# Patient Record
Sex: Female | Born: 1955 | Race: Black or African American | Hispanic: No | Marital: Married | Smoking: Never smoker
Health system: Southern US, Community
[De-identification: ages and names within clinical notes are randomized; demographics above are authoritative.]

## PROBLEM LIST (undated history)

## (undated) DIAGNOSIS — Z87440 Personal history of urinary (tract) infections: Secondary | ICD-10-CM

## (undated) DIAGNOSIS — N6310 Unspecified lump in the right breast, unspecified quadrant: Secondary | ICD-10-CM

## (undated) DIAGNOSIS — E7800 Pure hypercholesterolemia, unspecified: Secondary | ICD-10-CM

## (undated) DIAGNOSIS — C50919 Malignant neoplasm of unspecified site of unspecified female breast: Secondary | ICD-10-CM

## (undated) DIAGNOSIS — R21 Rash and other nonspecific skin eruption: Secondary | ICD-10-CM

## (undated) DIAGNOSIS — S129XXA Fracture of neck, unspecified, initial encounter: Secondary | ICD-10-CM

## (undated) DIAGNOSIS — I10 Essential (primary) hypertension: Secondary | ICD-10-CM

## (undated) HISTORY — DX: Essential (primary) hypertension: I10

## (undated) HISTORY — DX: Malignant neoplasm of unspecified site of unspecified female breast: C50.919

## (undated) HISTORY — PX: PORTACATH PLACEMENT: SHX2246

## (undated) HISTORY — PX: ABDOMINAL HYSTERECTOMY: SHX81

## (undated) HISTORY — DX: Personal history of urinary (tract) infections: Z87.440

## (undated) HISTORY — DX: Rash and other nonspecific skin eruption: R21

## (undated) HISTORY — PX: PORT-A-CATH REMOVAL: SHX5289

## (undated) HISTORY — DX: Pure hypercholesterolemia, unspecified: E78.00

## (undated) HISTORY — PX: BREAST BIOPSY: SHX20

## (undated) HISTORY — PX: PARTIAL HYSTERECTOMY: SHX80

---
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
## 2001-04-29 ENCOUNTER — Other Ambulatory Visit: Admission: RE | Admit: 2001-04-29 | Discharge: 2001-04-29 | Payer: Self-pay | Admitting: Obstetrics and Gynecology

## 2003-07-21 ENCOUNTER — Ambulatory Visit (HOSPITAL_COMMUNITY): Admission: RE | Admit: 2003-07-21 | Discharge: 2003-07-21 | Payer: Self-pay | Admitting: Family Medicine

## 2003-08-04 ENCOUNTER — Ambulatory Visit (HOSPITAL_COMMUNITY): Admission: RE | Admit: 2003-08-04 | Discharge: 2003-08-04 | Payer: Self-pay | Admitting: Family Medicine

## 2004-01-03 ENCOUNTER — Ambulatory Visit (HOSPITAL_COMMUNITY): Admission: RE | Admit: 2004-01-03 | Discharge: 2004-01-03 | Payer: Self-pay | Admitting: Family Medicine

## 2004-09-10 DIAGNOSIS — C50919 Malignant neoplasm of unspecified site of unspecified female breast: Secondary | ICD-10-CM

## 2004-09-10 HISTORY — DX: Malignant neoplasm of unspecified site of unspecified female breast: C50.919

## 2004-11-15 ENCOUNTER — Ambulatory Visit (HOSPITAL_COMMUNITY): Admission: RE | Admit: 2004-11-15 | Discharge: 2004-11-15 | Payer: Self-pay | Admitting: Obstetrics and Gynecology

## 2004-11-21 ENCOUNTER — Encounter: Admission: RE | Admit: 2004-11-21 | Discharge: 2004-11-21 | Payer: Self-pay | Admitting: Obstetrics and Gynecology

## 2005-01-08 HISTORY — PX: MASTECTOMY: SHX3

## 2005-01-15 ENCOUNTER — Ambulatory Visit (HOSPITAL_COMMUNITY): Admission: RE | Admit: 2005-01-15 | Discharge: 2005-01-16 | Payer: Self-pay | Admitting: General Surgery

## 2005-01-15 ENCOUNTER — Encounter (INDEPENDENT_AMBULATORY_CARE_PROVIDER_SITE_OTHER): Payer: Self-pay | Admitting: *Deleted

## 2005-01-15 ENCOUNTER — Encounter (INDEPENDENT_AMBULATORY_CARE_PROVIDER_SITE_OTHER): Payer: Self-pay | Admitting: General Surgery

## 2005-02-01 ENCOUNTER — Ambulatory Visit: Payer: Self-pay | Admitting: Oncology

## 2005-02-12 ENCOUNTER — Ambulatory Visit (HOSPITAL_COMMUNITY): Admission: RE | Admit: 2005-02-12 | Discharge: 2005-02-12 | Payer: Self-pay | Admitting: Oncology

## 2005-02-14 ENCOUNTER — Ambulatory Visit: Admission: RE | Admit: 2005-02-14 | Discharge: 2005-03-20 | Payer: Self-pay | Admitting: *Deleted

## 2005-02-20 ENCOUNTER — Ambulatory Visit (HOSPITAL_COMMUNITY): Admission: RE | Admit: 2005-02-20 | Discharge: 2005-02-20 | Payer: Self-pay | Admitting: *Deleted

## 2005-02-20 ENCOUNTER — Encounter (INDEPENDENT_AMBULATORY_CARE_PROVIDER_SITE_OTHER): Payer: Self-pay | Admitting: Cardiology

## 2005-02-23 ENCOUNTER — Encounter: Admission: RE | Admit: 2005-02-23 | Discharge: 2005-02-23 | Payer: Self-pay | Admitting: Oncology

## 2005-02-23 ENCOUNTER — Ambulatory Visit (HOSPITAL_COMMUNITY): Payer: Self-pay | Admitting: Oncology

## 2005-02-28 ENCOUNTER — Ambulatory Visit (HOSPITAL_BASED_OUTPATIENT_CLINIC_OR_DEPARTMENT_OTHER): Admission: RE | Admit: 2005-02-28 | Discharge: 2005-02-28 | Payer: Self-pay | Admitting: General Surgery

## 2005-02-28 ENCOUNTER — Ambulatory Visit (HOSPITAL_COMMUNITY): Admission: RE | Admit: 2005-02-28 | Discharge: 2005-02-28 | Payer: Self-pay | Admitting: General Surgery

## 2005-03-19 ENCOUNTER — Ambulatory Visit: Payer: Self-pay | Admitting: Oncology

## 2005-04-23 ENCOUNTER — Ambulatory Visit: Admission: RE | Admit: 2005-04-23 | Discharge: 2005-07-22 | Payer: Self-pay | Admitting: *Deleted

## 2005-04-24 ENCOUNTER — Ambulatory Visit: Admission: RE | Admit: 2005-04-24 | Discharge: 2005-04-24 | Payer: Self-pay | Admitting: Oncology

## 2005-04-24 ENCOUNTER — Encounter (INDEPENDENT_AMBULATORY_CARE_PROVIDER_SITE_OTHER): Payer: Self-pay | Admitting: Cardiology

## 2005-05-08 ENCOUNTER — Ambulatory Visit: Payer: Self-pay | Admitting: Oncology

## 2005-05-28 ENCOUNTER — Encounter (HOSPITAL_COMMUNITY): Admission: RE | Admit: 2005-05-28 | Discharge: 2005-06-09 | Payer: Self-pay | Admitting: Oncology

## 2005-05-28 ENCOUNTER — Encounter: Admission: RE | Admit: 2005-05-28 | Discharge: 2005-06-09 | Payer: Self-pay | Admitting: Oncology

## 2005-05-28 ENCOUNTER — Ambulatory Visit (HOSPITAL_COMMUNITY): Payer: Self-pay | Admitting: Oncology

## 2005-06-14 ENCOUNTER — Encounter (HOSPITAL_COMMUNITY): Admission: RE | Admit: 2005-06-14 | Discharge: 2005-07-16 | Payer: Self-pay | Admitting: Oncology

## 2005-06-14 ENCOUNTER — Encounter: Admission: RE | Admit: 2005-06-14 | Discharge: 2005-06-14 | Payer: Self-pay | Admitting: Oncology

## 2005-06-18 ENCOUNTER — Ambulatory Visit (HOSPITAL_COMMUNITY): Admission: RE | Admit: 2005-06-18 | Discharge: 2005-06-18 | Payer: Self-pay | Admitting: Oncology

## 2005-07-18 ENCOUNTER — Ambulatory Visit (HOSPITAL_COMMUNITY): Payer: Self-pay | Admitting: Oncology

## 2005-07-18 ENCOUNTER — Encounter (HOSPITAL_COMMUNITY): Admission: RE | Admit: 2005-07-18 | Discharge: 2005-08-17 | Payer: Self-pay | Admitting: Oncology

## 2005-07-18 ENCOUNTER — Encounter: Admission: RE | Admit: 2005-07-18 | Discharge: 2005-07-18 | Payer: Self-pay | Admitting: Oncology

## 2005-08-22 ENCOUNTER — Encounter (HOSPITAL_COMMUNITY): Admission: RE | Admit: 2005-08-22 | Discharge: 2005-08-22 | Payer: Self-pay | Admitting: Oncology

## 2005-08-22 ENCOUNTER — Encounter: Admission: RE | Admit: 2005-08-22 | Discharge: 2005-08-22 | Payer: Self-pay | Admitting: Oncology

## 2005-09-12 ENCOUNTER — Encounter (HOSPITAL_COMMUNITY): Admission: RE | Admit: 2005-09-12 | Discharge: 2005-10-12 | Payer: Self-pay | Admitting: Oncology

## 2005-09-12 ENCOUNTER — Ambulatory Visit (HOSPITAL_COMMUNITY): Payer: Self-pay | Admitting: Oncology

## 2005-09-12 ENCOUNTER — Encounter: Admission: RE | Admit: 2005-09-12 | Discharge: 2005-09-12 | Payer: Self-pay | Admitting: Oncology

## 2005-10-03 ENCOUNTER — Ambulatory Visit: Payer: Self-pay | Admitting: *Deleted

## 2005-10-12 ENCOUNTER — Ambulatory Visit (HOSPITAL_COMMUNITY): Admission: RE | Admit: 2005-10-12 | Discharge: 2005-10-12 | Payer: Self-pay | Admitting: Plastic Surgery

## 2005-10-24 ENCOUNTER — Encounter (HOSPITAL_COMMUNITY): Admission: RE | Admit: 2005-10-24 | Discharge: 2005-11-23 | Payer: Self-pay | Admitting: Oncology

## 2005-10-24 ENCOUNTER — Encounter: Admission: RE | Admit: 2005-10-24 | Discharge: 2005-10-24 | Payer: Self-pay | Admitting: Oncology

## 2005-11-14 ENCOUNTER — Ambulatory Visit (HOSPITAL_COMMUNITY): Payer: Self-pay | Admitting: Oncology

## 2005-12-05 ENCOUNTER — Encounter (HOSPITAL_COMMUNITY): Admission: RE | Admit: 2005-12-05 | Discharge: 2006-01-04 | Payer: Self-pay | Admitting: Oncology

## 2005-12-05 ENCOUNTER — Encounter: Admission: RE | Admit: 2005-12-05 | Discharge: 2005-12-05 | Payer: Self-pay | Admitting: Oncology

## 2005-12-26 ENCOUNTER — Ambulatory Visit: Payer: Self-pay | Admitting: *Deleted

## 2006-01-09 ENCOUNTER — Ambulatory Visit (HOSPITAL_COMMUNITY): Admission: RE | Admit: 2006-01-09 | Discharge: 2006-01-09 | Payer: Self-pay | Admitting: Oncology

## 2006-01-16 ENCOUNTER — Encounter: Admission: RE | Admit: 2006-01-16 | Discharge: 2006-01-16 | Payer: Self-pay | Admitting: Oncology

## 2006-01-16 ENCOUNTER — Ambulatory Visit (HOSPITAL_COMMUNITY): Payer: Self-pay | Admitting: Oncology

## 2006-01-16 ENCOUNTER — Encounter (HOSPITAL_COMMUNITY): Admission: RE | Admit: 2006-01-16 | Discharge: 2006-02-15 | Payer: Self-pay | Admitting: Oncology

## 2006-02-27 ENCOUNTER — Encounter: Admission: RE | Admit: 2006-02-27 | Discharge: 2006-02-27 | Payer: Self-pay | Admitting: Oncology

## 2006-02-27 ENCOUNTER — Encounter (HOSPITAL_COMMUNITY): Admission: RE | Admit: 2006-02-27 | Discharge: 2006-03-29 | Payer: Self-pay | Admitting: Oncology

## 2006-05-01 ENCOUNTER — Encounter: Admission: RE | Admit: 2006-05-01 | Discharge: 2006-05-01 | Payer: Self-pay | Admitting: Oncology

## 2006-05-01 ENCOUNTER — Ambulatory Visit (HOSPITAL_COMMUNITY): Payer: Self-pay | Admitting: Oncology

## 2006-05-02 ENCOUNTER — Ambulatory Visit (HOSPITAL_COMMUNITY): Admission: RE | Admit: 2006-05-02 | Discharge: 2006-05-02 | Payer: Self-pay | Admitting: Oncology

## 2006-06-13 ENCOUNTER — Encounter: Admission: RE | Admit: 2006-06-13 | Discharge: 2006-06-13 | Payer: Self-pay | Admitting: Oncology

## 2006-07-25 ENCOUNTER — Ambulatory Visit (HOSPITAL_COMMUNITY): Payer: Self-pay | Admitting: Oncology

## 2006-09-09 ENCOUNTER — Ambulatory Visit (HOSPITAL_COMMUNITY): Payer: Self-pay | Admitting: Oncology

## 2006-10-21 ENCOUNTER — Encounter (HOSPITAL_COMMUNITY): Admission: RE | Admit: 2006-10-21 | Discharge: 2006-11-20 | Payer: Self-pay | Admitting: Oncology

## 2006-11-04 ENCOUNTER — Ambulatory Visit (HOSPITAL_COMMUNITY): Payer: Self-pay | Admitting: Oncology

## 2007-01-09 ENCOUNTER — Encounter (HOSPITAL_COMMUNITY): Admission: RE | Admit: 2007-01-09 | Discharge: 2007-02-08 | Payer: Self-pay | Admitting: Oncology

## 2007-01-13 ENCOUNTER — Ambulatory Visit (HOSPITAL_COMMUNITY): Payer: Self-pay | Admitting: Oncology

## 2007-04-07 ENCOUNTER — Encounter (HOSPITAL_COMMUNITY): Admission: RE | Admit: 2007-04-07 | Discharge: 2007-05-07 | Payer: Self-pay | Admitting: Oncology

## 2007-04-07 ENCOUNTER — Ambulatory Visit (HOSPITAL_COMMUNITY): Payer: Self-pay | Admitting: Oncology

## 2007-06-16 ENCOUNTER — Ambulatory Visit (HOSPITAL_COMMUNITY): Payer: Self-pay | Admitting: Oncology

## 2007-06-16 ENCOUNTER — Encounter (HOSPITAL_COMMUNITY): Admission: RE | Admit: 2007-06-16 | Discharge: 2007-07-16 | Payer: Self-pay | Admitting: Oncology

## 2007-08-21 ENCOUNTER — Encounter (HOSPITAL_COMMUNITY): Admission: RE | Admit: 2007-08-21 | Discharge: 2007-09-10 | Payer: Self-pay | Admitting: Oncology

## 2007-08-25 ENCOUNTER — Ambulatory Visit (HOSPITAL_COMMUNITY): Payer: Self-pay | Admitting: Oncology

## 2007-10-06 ENCOUNTER — Encounter (HOSPITAL_COMMUNITY): Admission: RE | Admit: 2007-10-06 | Discharge: 2007-11-05 | Payer: Self-pay | Admitting: Oncology

## 2007-11-03 ENCOUNTER — Ambulatory Visit (HOSPITAL_COMMUNITY): Payer: Self-pay | Admitting: Oncology

## 2008-01-21 ENCOUNTER — Encounter (HOSPITAL_COMMUNITY): Admission: RE | Admit: 2008-01-21 | Discharge: 2008-02-20 | Payer: Self-pay | Admitting: Oncology

## 2008-01-26 ENCOUNTER — Ambulatory Visit (HOSPITAL_COMMUNITY): Payer: Self-pay | Admitting: Oncology

## 2008-04-19 ENCOUNTER — Ambulatory Visit (HOSPITAL_COMMUNITY): Payer: Self-pay | Admitting: Oncology

## 2008-05-31 ENCOUNTER — Encounter (HOSPITAL_COMMUNITY): Admission: RE | Admit: 2008-05-31 | Discharge: 2008-06-07 | Payer: Self-pay | Admitting: Oncology

## 2008-07-12 ENCOUNTER — Ambulatory Visit (HOSPITAL_COMMUNITY): Payer: Self-pay | Admitting: Oncology

## 2008-10-04 ENCOUNTER — Ambulatory Visit (HOSPITAL_COMMUNITY): Payer: Self-pay | Admitting: Oncology

## 2009-01-03 ENCOUNTER — Encounter (HOSPITAL_COMMUNITY): Admission: RE | Admit: 2009-01-03 | Discharge: 2009-02-02 | Payer: Self-pay | Admitting: Oncology

## 2009-01-03 ENCOUNTER — Ambulatory Visit (HOSPITAL_COMMUNITY): Payer: Self-pay | Admitting: Oncology

## 2009-03-11 ENCOUNTER — Ambulatory Visit (HOSPITAL_COMMUNITY): Admission: RE | Admit: 2009-03-11 | Discharge: 2009-03-11 | Payer: Self-pay | Admitting: Oncology

## 2009-03-17 ENCOUNTER — Ambulatory Visit (HOSPITAL_COMMUNITY): Payer: Self-pay | Admitting: Oncology

## 2009-09-14 ENCOUNTER — Encounter (HOSPITAL_COMMUNITY): Admission: RE | Admit: 2009-09-14 | Discharge: 2009-10-14 | Payer: Self-pay | Admitting: Oncology

## 2009-09-14 ENCOUNTER — Ambulatory Visit (HOSPITAL_COMMUNITY): Payer: Self-pay | Admitting: Oncology

## 2010-02-10 ENCOUNTER — Ambulatory Visit (HOSPITAL_COMMUNITY): Admission: RE | Admit: 2010-02-10 | Discharge: 2010-02-10 | Payer: Self-pay | Admitting: Oncology

## 2010-03-28 ENCOUNTER — Ambulatory Visit (HOSPITAL_COMMUNITY): Payer: Self-pay | Admitting: Oncology

## 2010-04-25 ENCOUNTER — Encounter: Payer: Self-pay | Admitting: Gastroenterology

## 2010-05-09 ENCOUNTER — Ambulatory Visit (HOSPITAL_COMMUNITY): Admission: RE | Admit: 2010-05-09 | Discharge: 2010-05-09 | Payer: Self-pay | Admitting: Gastroenterology

## 2010-05-09 ENCOUNTER — Ambulatory Visit: Payer: Self-pay | Admitting: Gastroenterology

## 2010-10-04 ENCOUNTER — Encounter (HOSPITAL_COMMUNITY)
Admission: RE | Admit: 2010-10-04 | Discharge: 2010-10-10 | Payer: Self-pay | Source: Home / Self Care | Attending: Oncology | Admitting: Oncology

## 2010-10-04 ENCOUNTER — Ambulatory Visit (HOSPITAL_COMMUNITY)
Admission: RE | Admit: 2010-10-04 | Discharge: 2010-10-10 | Payer: Self-pay | Source: Home / Self Care | Attending: Oncology | Admitting: Oncology

## 2010-10-04 LAB — DIFFERENTIAL
Basophils Absolute: 0 10*3/uL (ref 0.0–0.1)
Basophils Relative: 0 % (ref 0–1)
Eosinophils Absolute: 0.2 10*3/uL (ref 0.0–0.7)
Eosinophils Relative: 4 % (ref 0–5)
Lymphocytes Relative: 39 % (ref 12–46)
Lymphs Abs: 1.8 10*3/uL (ref 0.7–4.0)
Monocytes Absolute: 0.4 10*3/uL (ref 0.1–1.0)
Monocytes Relative: 8 % (ref 3–12)
Neutro Abs: 2.3 10*3/uL (ref 1.7–7.7)
Neutrophils Relative %: 50 % (ref 43–77)

## 2010-10-04 LAB — COMPREHENSIVE METABOLIC PANEL
ALT: 28 U/L (ref 0–35)
AST: 29 U/L (ref 0–37)
Albumin: 4.1 g/dL (ref 3.5–5.2)
Alkaline Phosphatase: 100 U/L (ref 39–117)
BUN: 10 mg/dL (ref 6–23)
CO2: 29 mEq/L (ref 19–32)
Calcium: 9.7 mg/dL (ref 8.4–10.5)
Chloride: 96 mEq/L (ref 96–112)
Creatinine, Ser: 0.74 mg/dL (ref 0.4–1.2)
GFR calc Af Amer: >60 mL/min (ref >60)
GFR calc non Af Amer: >60 mL/min (ref >60)
Glucose, Bld: 96 mg/dL (ref 70–99)
Potassium: 3.8 mEq/L (ref 3.5–5.1)
Sodium: 134 mEq/L — ABNORMAL LOW (ref 135–145)
Total Bilirubin: 0.4 mg/dL (ref 0.3–1.2)
Total Protein: 7.9 g/dL (ref 6.0–8.3)

## 2010-10-04 LAB — CBC
HCT: 36.7 % (ref 36.0–46.0)
Hemoglobin: 12.8 g/dL (ref 12.0–15.0)
MCH: 31 pg (ref 26.0–34.0)
MCHC: 34.9 g/dL (ref 30.0–36.0)
MCV: 88.9 fL (ref 78.0–100.0)
Platelets: 297 10*3/uL (ref 150–400)
RBC: 4.13 MIL/uL (ref 3.87–5.11)
RDW: 12.1 % (ref 11.5–15.5)
WBC: 4.6 10*3/uL (ref 4.0–10.5)

## 2010-10-10 NOTE — Letter (Signed)
Summary: Internal Other Domingo Dimes  Internal Other Domingo Dimes   Imported By: Cloria Spring LPN [id number]/[id number] 43:32:95  _____________________________________________________________________  External Attachment:    Type:   Image     Comment:   External Document

## 2010-11-26 LAB — COMPREHENSIVE METABOLIC PANEL
ALT: 44 U/L — ABNORMAL HIGH (ref 0–35)
AST: 34 U/L (ref 0–37)
Albumin: 3.8 g/dL (ref 3.5–5.2)
Alkaline Phosphatase: 112 U/L (ref 39–117)
BUN: 15 mg/dL (ref 6–23)
CO2: 29 mEq/L (ref 19–32)
Calcium: 9.5 mg/dL (ref 8.4–10.5)
Chloride: 96 mEq/L (ref 96–112)
Creatinine, Ser: 0.77 mg/dL (ref 0.4–1.2)
GFR calc Af Amer: >60 mL/min (ref >60)
GFR calc non Af Amer: >60 mL/min (ref >60)
Glucose, Bld: 86 mg/dL (ref 70–99)
Potassium: 3.6 mEq/L (ref 3.5–5.1)
Sodium: 135 mEq/L (ref 135–145)
Total Bilirubin: 0.6 mg/dL (ref 0.3–1.2)
Total Protein: 8 g/dL (ref 6.0–8.3)

## 2010-11-26 LAB — DIFFERENTIAL
Basophils Absolute: 0 10*3/uL (ref 0.0–0.1)
Basophils Relative: 1 % (ref 0–1)
Eosinophils Absolute: 0.1 10*3/uL (ref 0.0–0.7)
Eosinophils Relative: 3 % (ref 0–5)
Lymphocytes Relative: 37 % (ref 12–46)
Lymphs Abs: 1.6 10*3/uL (ref 0.7–4.0)
Monocytes Absolute: 0.4 10*3/uL (ref 0.1–1.0)
Monocytes Relative: 9 % (ref 3–12)
Neutro Abs: 2.2 10*3/uL (ref 1.7–7.7)
Neutrophils Relative %: 51 % (ref 43–77)

## 2010-11-26 LAB — CBC
HCT: 35.8 % — ABNORMAL LOW (ref 36.0–46.0)
Hemoglobin: 12.4 g/dL (ref 12.0–15.0)
MCHC: 34.7 g/dL (ref 30.0–36.0)
MCV: 90.7 fL (ref 78.0–100.0)
Platelets: 265 10*3/uL (ref 150–400)
RBC: 3.95 MIL/uL (ref 3.87–5.11)
RDW: 12.3 % (ref 11.5–15.5)
WBC: 4.3 10*3/uL (ref 4.0–10.5)

## 2010-12-18 LAB — GLUCOSE, CAPILLARY: Glucose-Capillary: 97 mg/dL (ref 70–99)

## 2011-01-26 NOTE — Discharge Summary (Signed)
NAMEJATON, Stephanie NO.:  [id number]   MEDICAL RECORD NO.:  [id number]          PATIENT TYPE:  OIB   LOCATION:  5710                         FACILITY:  [facility]   PHYSICIAN:  Etter Sjogren, M.D.     DATE OF BIRTH:  June 22, 1956   DATE OF ADMISSION:  01/15/2005  DATE OF DISCHARGE:  01/16/2005                                 DISCHARGE SUMMARY   FINAL DIAGNOSES:  Left breast cancer.   PROCEDURE:  1.  Left total mastectomy.  2.  Left sentinel lymph node biopsy.  3.  Left breast reconstructive tissue expander.   HISTORY OF PRESENT ILLNESS:  This is a 55 year old woman with left breast  cancer.  Otherwise in good health except for some hypertension, well-  controlled medically.  She discussed with the general surgeon at length and  then mastectomy was recommended.  She desired reconstruction and she elected  to have reconstruction using tissue expander followed by implant.  This  procedure and risks were discussed with her in detail and she understood  these and wished to proceed.   HOSPITAL COURSE:  On admission, her hemoglobin 12.0, hematocrit 35.2, WBC  5.8.  Her routine chemistry with slight elevation in one of her liver  function test.  Otherwise, all normal.  She was taken to surgery at which  time the mastectomy, sentinel lymph node biopsy and reconstructive tissue  expander were performed.  She tolerated this well.   Postoperatively, she did well and remained afebrile.  Drain was functioning.  Chest soft.  Breast implant looked very good.  She is ready to be  discharged.   DISPOSITION:  She was discharged on a regular diet.  No lifting.  No raising  her arm over her head.  She will use her spirometer at home.  No shower yet.  Empty drain three times a day and include the amount.   DISCHARGE MEDICATIONS:  She will be on Keflex four times a day and other  medication as previously.   FOLLOW UP:  Will see her back in the office in six days for  recheck.      DB/MEDQ  D:  01/16/2005  T:  01/16/2005  Job:  [id number]

## 2011-01-26 NOTE — Procedures (Signed)
NAMEFERNANDA, Stephanie Galvan NO.:  [id number]   MEDICAL RECORD NO.:  [id number]          PATIENT TYPE:  REC   LOCATION:                                FACILITY:  [facility]   PHYSICIAN:  Vida Roller, M.D.   DATE OF BIRTH:  1956-08-30   DATE OF PROCEDURE:  12/26/2005  DATE OF DISCHARGE:                                  ECHOCARDIOGRAM   TAPE NUMBER:  [id number].   TAPE COUNT:  [id number].   This is an assessment for LV systolic function on a woman to follow up with  chemotherapy for breast cancer.  Previous study done in January of this year  shows preserved LV systolic function with an ejection fraction of about 50  to 55%.  No significant valvular heart disease.  Today's study is  technically difficult, very limited views due to the patient having a left  breast implant, so assessment in somewhat limited.   __________ Tracings:  The aorta is 31 mm.   Left atrium is 38 mm.   Septum is 11 mm.   Posterior wall is 11 mm.   Left ventricular diastolic dimension 45 mm.   Left ventricular systolic dimension 39 mm.   A 2-D and Doppler imaging:  The left ventricle appears to be normal size.  There is depressed LV systolic function with an estimated ejection fraction  of around 40%.  Very difficult study to assess.  Measurements of the LV  systolic function show an ejection fraction of 30 to 35%, however, the  parasternal short axis view appears to be improved.  LV systolic function  from that seen in the parasternal wall, however, there is no corroborative  apical views due to the patient's limited acoustic windows and therefore  assessment is very difficult.  The RV systolic function appears normal.   There is no pericardial effusion.   The aortic valve and mitral valve appear to be morphologically unremarkable.  There is mild mitral regurgitation.   ASSESSMENT:  Significantly decreased left ventricular systolic function from  previous study, however, this study is  technically poor.  Would recommend a  more definitive study of a MUGA scan to clarify the ejection fraction and if  this patient needs ongoing evaluation of her left ventricular systolic  function, it may be test to do serial MUGA scans as opposed to echoes due to  the limited windows.      Vida Roller, M.D.  Electronically Signed     JH/MEDQ  D:  12/26/2005  T:  12/27/2005  Job:  [id number]   cc:   Ladona Horns. Mariel Sleet, MD  Fax: [phone]

## 2011-01-26 NOTE — Op Note (Signed)
NAMEMIKAL, WISMAN NO.:  [id number]   MEDICAL RECORD NO.:  [id number]          PATIENT TYPE:  INP   LOCATION:  2899                         FACILITY:  [facility]   PHYSICIAN:  Etter Sjogren, M.D.     DATE OF BIRTH:  Jun 09, 1956   DATE OF PROCEDURE:  10/12/2005  DATE OF DISCHARGE:                                 OPERATIVE REPORT   PREOPERATIVE DIAGNOSES:  1.  Left breast cancer.  2.  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   POSTOPERATIVE DIAGNOSES:  1.  Left breast cancer.  2.  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   OPERATION PERFORMED:  1.  Extensive capsulotomy for capsular contracture.  2.  Left breast reconstruction removing expander and placement of silicone      gel implant.   SURGEON:  Etter Sjogren, M.D.   ANESTHESIA:  General.   ESTIMATED BLOOD LOSS:  Minimal.   INDICATIONS FOR PROCEDURE:  55 year old woman who has had breast cancer, has  had placement of a tissue expander for reconstruction at the time of her  mastectomy.  She subsequently had chemotherapy followed by radiation  therapy.  The radiation was unplanned preoperatively.  She has had  significant capsular contracture.  The long term effects of radiation were  discussed with her and the possibility of complications and the possibility  of doing a latissimus flap, placement of a rim flap was discussed.  She  decline the latissimus flap, prefers to try the placement of the implant to  see what sort of healing she could achieve with that alone.  She understood  she would have to have a fairly extensive capsulotomy at that time to lower  the implant in position.  She wished to proceed.   DESCRIPTION OF PROCEDURE:  The patient was placed in the full upright  position in the holding area and marked for the capsulotomy.  She was then  taken to the operating room and placed supine.  After  successful induction  of general anesthesia, she was prepped with Betadine and draped with sterile  drapes.  The old mastectomy scar was utilized, a total of six inches in  length and the dissection carried down through the subcutaneous tissue and  muscle using electrocautery.  The expander was removed very gently.  The  capsulotomy was performed from  medial to lateral on the inframammary fold  taking great care to avoid any damage to the underlying chest.  Care was  taken to stay on top of the ribs but deep to the muscle. Meticulous  hemostasis with the electrocautery.  Thorough irrigation with saline and  then a 3-0 Vicryl horizontal mattress suture was placed at either end of the  wound for the capsule closure but left untied.  With upward traction  maintained on these sutures, antibiotic solution was placed in the pocket  and allowed to dwell there.  Likewise, antibiotic solution was placed over  the silicone gel implant and it was allowed to dwell  in the antibiotic  solution.  The implant was a high profile silicone memory gel from the  [employer]. After thoroughly cleaning gloves, the implant was positioned  in the pocket.  She was placed in sitting position on the operating table  and excellent symmetry with the opposite site was noted.  The remainder of  the closure with 3-0 Vicryl interrupted horizontal figure-of-eight sutures  taking great care to avoid any damage to underlying implant which was  directly visualized and a malleable retractor was likely placed on the  implant to protect it.  These sutures were tied securely.  The wound was  irrigated thoroughly and the remainder of the closure with 3-0 Monocryl  interrupted inverted deep sutures, again taking great care to avoid any deep  penetration of the muscle and any damage to the implant followed by 3-0  Monocryl running subcuticular tissue, Steri-Strips, dry sterile dressing and  a circumferential Ace wrap applied.  The  Ace wrap was applied mostly above  the implant to hold it in the inferior aspect of the pocket.  The patient  tolerated the procedure well.   DISPOSITION:  We will recheck in the office next week.      Etter Sjogren, M.D.  Electronically Signed     DB/MEDQ  D:  10/12/2005  T:  10/12/2005  Job:  [id number]

## 2011-02-02 ENCOUNTER — Other Ambulatory Visit (HOSPITAL_COMMUNITY): Payer: Self-pay | Admitting: Oncology

## 2011-02-02 DIAGNOSIS — Z139 Encounter for screening, unspecified: Secondary | ICD-10-CM

## 2011-02-13 ENCOUNTER — Ambulatory Visit (HOSPITAL_COMMUNITY)
Admission: RE | Admit: 2011-02-13 | Discharge: 2011-02-13 | Disposition: A | Discharge status: Home / Self Care | Payer: BC Managed Care – PPO | Source: Ambulatory Visit | Attending: Oncology | Admitting: Oncology

## 2011-02-13 DIAGNOSIS — Z1231 Encounter for screening mammogram for malignant neoplasm of breast: Secondary | ICD-10-CM | POA: Insufficient documentation

## 2011-02-13 DIAGNOSIS — Z139 Encounter for screening, unspecified: Secondary | ICD-10-CM

## 2011-04-05 ENCOUNTER — Other Ambulatory Visit (HOSPITAL_COMMUNITY): Payer: Self-pay

## 2011-04-09 ENCOUNTER — Ambulatory Visit (HOSPITAL_COMMUNITY): Payer: Self-pay | Admitting: Oncology

## 2011-05-03 ENCOUNTER — Encounter (HOSPITAL_COMMUNITY): Payer: BC Managed Care – PPO | Attending: Oncology

## 2011-05-03 DIAGNOSIS — Z853 Personal history of malignant neoplasm of breast: Secondary | ICD-10-CM | POA: Insufficient documentation

## 2011-05-03 DIAGNOSIS — I10 Essential (primary) hypertension: Secondary | ICD-10-CM | POA: Insufficient documentation

## 2011-05-03 DIAGNOSIS — Z79899 Other long term (current) drug therapy: Secondary | ICD-10-CM | POA: Insufficient documentation

## 2011-05-03 DIAGNOSIS — E7800 Pure hypercholesterolemia, unspecified: Secondary | ICD-10-CM | POA: Insufficient documentation

## 2011-05-03 LAB — DIFFERENTIAL
Basophils Absolute: 0 10*3/uL (ref 0.0–0.1)
Basophils Relative: 1 % (ref 0–1)
Eosinophils Absolute: 0.2 10*3/uL (ref 0.0–0.7)
Eosinophils Relative: 4 % (ref 0–5)
Lymphocytes Relative: 38 % (ref 12–46)
Lymphs Abs: 1.7 10*3/uL (ref 0.7–4.0)
Monocytes Absolute: 0.3 10*3/uL (ref 0.1–1.0)
Monocytes Relative: 7 % (ref 3–12)
Neutro Abs: 2.2 10*3/uL (ref 1.7–7.7)
Neutrophils Relative %: 50 % (ref 43–77)

## 2011-05-03 LAB — CBC
HCT: 36.8 % (ref 36.0–46.0)
Hemoglobin: 12.2 g/dL (ref 12.0–15.0)
MCH: 30.3 pg (ref 26.0–34.0)
MCHC: 33.2 g/dL (ref 30.0–36.0)
MCV: 91.3 fL (ref 78.0–100.0)
Platelets: 277 10*3/uL (ref 150–400)
RBC: 4.03 MIL/uL (ref 3.87–5.11)
RDW: 12.2 % (ref 11.5–15.5)
WBC: 4.3 10*3/uL (ref 4.0–10.5)

## 2011-05-03 LAB — COMPREHENSIVE METABOLIC PANEL
ALT: 36 U/L — ABNORMAL HIGH (ref 0–35)
AST: 32 U/L (ref 0–37)
Albumin: 3.7 g/dL (ref 3.5–5.2)
Alkaline Phosphatase: 153 U/L — ABNORMAL HIGH (ref 39–117)
BUN: 16 mg/dL (ref 6–23)
CO2: 32 mEq/L (ref 19–32)
Calcium: 10.2 mg/dL (ref 8.4–10.5)
Chloride: 101 mEq/L (ref 96–112)
Creatinine, Ser: 0.69 mg/dL (ref 0.50–1.10)
GFR calc Af Amer: >60 mL/min (ref >60)
GFR calc non Af Amer: >60 mL/min (ref >60)
Glucose, Bld: 91 mg/dL (ref 70–99)
Potassium: 3.8 mEq/L (ref 3.5–5.1)
Sodium: 141 mEq/L (ref 135–145)
Total Bilirubin: 0.4 mg/dL (ref 0.3–1.2)
Total Protein: 7.7 g/dL (ref 6.0–8.3)

## 2011-05-03 NOTE — Progress Notes (Signed)
Labs drawn today for cbc/diff,cmp 

## 2011-05-15 ENCOUNTER — Encounter (HOSPITAL_COMMUNITY): Payer: BC Managed Care – PPO | Attending: Oncology | Admitting: Oncology

## 2011-05-15 ENCOUNTER — Encounter (HOSPITAL_COMMUNITY): Payer: Self-pay | Admitting: Oncology

## 2011-05-15 VITALS — BP 121/71 | HR 65 | Temp 98.6°F | Wt 177.0 lb

## 2011-05-15 DIAGNOSIS — C50919 Malignant neoplasm of unspecified site of unspecified female breast: Secondary | ICD-10-CM | POA: Insufficient documentation

## 2011-05-15 NOTE — Progress Notes (Signed)
CC:   Stephanie Galvan. Sudie Bailey, M.D. Thomas A. Cornett, M.D. Lowella Dell, M.D.  DIAGNOSES: 1. Multifocal left-sided breast cancer, ER/PR negative, but HER-2/neu     positive and she had 2 positive actual lymph nodes.  No LVI was     seen and margins were clear.  She was diagnosed in April 2006 with     surgery on 01/15/2005 consisting of a left simple mastectomy and     axillary sentinel node dissection and was treated with adjuvant     chemotherapy consisting of Taxotere and Cytoxan x4 cycles in a dose-     dense fashion in conjunction with Herceptin for 52 weeks in     conjunction also with radiation of the chest wall and intramammary     chain because of a questionable area on her CT scan. 2. Mild elevation of liver enzymes once again and I suspect is from a     statin and she will call Dr. Sudie Bailey about this. 3. Hypercholesterolemia. 4. Mild excess weight, weighing 177 pounds on a 5 feet and 9 inch     frame. 5. Tubal ligation after the birth of her second daughter. 6. Colonoscopies in the past. 7. Partial hysterectomy in the past. 8. History of hypertension on therapy with hydrochlorothiazide and     atenolol.  INTERVAL HISTORY:  Her vital signs today are fine, except for the weight which is back up from 168 to 177.  She has an excellent appetite and eats out a lot.  She does not always watch what she is eating.  She sometimes eats fried foods and a fair amount of bread.  She does not get any exercise, really.  On her liver enzymes the other day, once again the alkaline phosphatase and SGOT were mildly elevated.  She looks good and feels good.  Her review of systems oncologic is really negative, though.  She states that she is up-to-date on her colonoscopy.  She is not due for one for quite some time.  PHYSICAL EXAMINATION:  General Appearance:  Her exam remains benign. Lymphatics:  She has no adenopathy in any location.  Lungs:  Clear to auscultation and percussion.   Heart:  Regular rhythm and rate without murmur, rub or gallop.  Breasts:  The left reconstructed breast is negative for any lesions.  The right breast is negative for any lesions. The old Port-A-Cath site is intact with a slight keloid.  Abdomen: Soft, nontender without organomegaly or masses.  Bowel sounds are normal.  Extremities:  She has no peripheral edema in the arms or legs.  ASSESSMENT AND PLAN:  She looks great, but she will call Dr. Sudie Bailey in a few days.  I want her to discuss the statin issue with him since he controls that issue.  We will see her back this time in a year since she has done so well and she is out so long at this juncture.    ______________________________ Ladona Horns. Mariel Sleet, MD ESN/MEDQ  D:  05/15/2011  T:  05/15/2011  Job:  [id number]

## 2011-05-15 NOTE — Patient Instructions (Signed)
[facility]  Discharge Instructions  RECOMMENDATIONS MADE BY THE CONSULTANT AND ANY TEST RESULTS WILL BE SENT TO YOUR REFERRING DOCTOR.   EXAM FINDINGS BY MD TODAY AND SIGNS AND SYMPTOMS TO REPORT TO CLINIC OR PRIMARY MD: Doing well per MD.  Liver function tests slightly elevated, may be due to cholesterol medication.  Call Dr. Sudie Bailey in a few days and discuss with him.  Report any new lumps, bone pain or shortness of breath.  SPECIAL INSTRUCTIONS/FOLLOW-UP: Lab work Needed 1 year and Return to Clinic on 1 year.   I acknowledge that I have been informed and understand all the instructions given to me and received a copy. I do not have any more questions at this time, but understand that I may call the Specialty Clinic at [facility] at [phone] during business hours should I have any further questions or need assistance in obtaining follow-up care.    __________________________________________  _____________  __________ Signature of Patient or Authorized Representative            Date                   Time    __________________________________________ Nurse's Signature

## 2011-05-15 NOTE — Progress Notes (Signed)
This office note has been dictated.

## 2011-06-01 LAB — CBC
HCT: 35.6 — ABNORMAL LOW
Hemoglobin: 12.5
MCHC: 35.1
MCV: 90
Platelets: 270
RBC: 3.96
RDW: 12.4
WBC: 3.9 — ABNORMAL LOW

## 2011-06-01 LAB — LIPID PANEL
Cholesterol: 209 — ABNORMAL HIGH
HDL: 50
LDL Cholesterol: 150 — ABNORMAL HIGH
Total CHOL/HDL Ratio: 4.2
Triglycerides: 45
VLDL: 9

## 2011-06-01 LAB — DIFFERENTIAL
Basophils Absolute: 0
Basophils Relative: 0
Eosinophils Absolute: 0.1
Eosinophils Relative: 2
Lymphocytes Relative: 25
Lymphs Abs: 1
Monocytes Absolute: 0.4
Monocytes Relative: 10
Neutro Abs: 2.5
Neutrophils Relative %: 64

## 2011-06-01 LAB — COMPREHENSIVE METABOLIC PANEL
ALT: 21
AST: 21
Albumin: 4
Alkaline Phosphatase: 103
BUN: 7
CO2: 28
Calcium: 9.6
Chloride: 103
Creatinine, Ser: 0.75
GFR calc Af Amer: >60
GFR calc non Af Amer: >60
Glucose, Bld: 98
Potassium: 3.4 — ABNORMAL LOW
Sodium: 137
Total Bilirubin: 0.7
Total Protein: 7.7

## 2011-06-11 LAB — DIFFERENTIAL
Basophils Absolute: 0
Basophils Relative: 1
Eosinophils Absolute: 0.1
Eosinophils Relative: 3
Lymphocytes Relative: 42
Lymphs Abs: 1.6
Monocytes Absolute: 0.3
Monocytes Relative: 8
Neutro Abs: 1.8
Neutrophils Relative %: 47

## 2011-06-11 LAB — COMPREHENSIVE METABOLIC PANEL
ALT: 39 — ABNORMAL HIGH
AST: 35
Albumin: 3.8
Alkaline Phosphatase: 89
BUN: 16
CO2: 29
Calcium: 9.2
Chloride: 101
Creatinine, Ser: 0.7
GFR calc Af Amer: >60
GFR calc non Af Amer: >60
Glucose, Bld: 88
Potassium: 3.3 — ABNORMAL LOW
Sodium: 138
Total Bilirubin: 0.6
Total Protein: 7.6

## 2011-06-11 LAB — LIPID PANEL
Cholesterol: 203 — ABNORMAL HIGH
HDL: 36 — ABNORMAL LOW
LDL Cholesterol: 153 — ABNORMAL HIGH
Total CHOL/HDL Ratio: 5.6
Triglycerides: 68
VLDL: 14

## 2011-06-11 LAB — CBC
HCT: 33.8 — ABNORMAL LOW
Hemoglobin: 11.7 — ABNORMAL LOW
MCHC: 34.5
MCV: 92.3
Platelets: 249
RBC: 3.66 — ABNORMAL LOW
RDW: 11.9
WBC: 4.2

## 2011-06-18 LAB — CREATININE, SERUM
Creatinine, Ser: 0.72
GFR calc Af Amer: >60
GFR calc non Af Amer: >60

## 2011-06-19 LAB — LIPID PANEL
Cholesterol: 236 — ABNORMAL HIGH
HDL: 46
LDL Cholesterol: 166 — ABNORMAL HIGH
Total CHOL/HDL Ratio: 5.1
Triglycerides: 121
VLDL: 24

## 2011-06-19 LAB — COMPREHENSIVE METABOLIC PANEL
ALT: 77 — ABNORMAL HIGH
AST: 54 — ABNORMAL HIGH
Albumin: 3.7
Alkaline Phosphatase: 155 — ABNORMAL HIGH
BUN: 12
CO2: 30
Calcium: 9.5
Chloride: 103
Creatinine, Ser: 0.64
GFR calc Af Amer: >60
GFR calc non Af Amer: >60
Glucose, Bld: 98
Potassium: 3.7
Sodium: 140
Total Bilirubin: 0.7
Total Protein: 7.6

## 2011-06-22 LAB — HEPATITIS B SURFACE ANTIBODY,QUALITATIVE: Hep B S Ab: NEGATIVE

## 2011-06-22 LAB — HEPATITIS C ANTIBODY: HCV Ab: NEGATIVE

## 2011-06-22 LAB — HEPATITIS A ANTIBODY, TOTAL: Hep A Total Ab: NEGATIVE

## 2011-06-22 LAB — HEPATITIS A ANTIBODY, IGM: Hep A IgM: NEGATIVE

## 2011-06-22 LAB — HEPATITIS B SURFACE ANTIGEN: Hepatitis B Surface Ag: NEGATIVE

## 2011-06-25 LAB — COMPREHENSIVE METABOLIC PANEL
ALT: 40 — ABNORMAL HIGH
AST: 31
Albumin: 3.6
Alkaline Phosphatase: 158 — ABNORMAL HIGH
BUN: 14
CO2: 30
Calcium: 9.1
Chloride: 100
Creatinine, Ser: 0.7
GFR calc Af Amer: >60
GFR calc non Af Amer: >60
Glucose, Bld: 94
Potassium: 3.5
Sodium: 138
Total Bilirubin: 0.4
Total Protein: 7.5

## 2011-06-25 LAB — CBC
HCT: 33.3 — ABNORMAL LOW
Hemoglobin: 11.4 — ABNORMAL LOW
MCHC: 34.4
MCV: 89.4
Platelets: 247
RBC: 3.72 — ABNORMAL LOW
RDW: 12.5
WBC: 4.8

## 2011-06-25 LAB — CANCER ANTIGEN 27.29: CA 27.29: 22

## 2011-07-17 ENCOUNTER — Encounter: Payer: Self-pay | Admitting: Oncology

## 2012-01-29 ENCOUNTER — Other Ambulatory Visit (HOSPITAL_COMMUNITY): Payer: Self-pay | Admitting: Oncology

## 2012-01-29 DIAGNOSIS — Z139 Encounter for screening, unspecified: Secondary | ICD-10-CM

## 2012-02-15 ENCOUNTER — Ambulatory Visit (HOSPITAL_COMMUNITY): Payer: BC Managed Care – PPO

## 2012-02-19 ENCOUNTER — Ambulatory Visit (HOSPITAL_COMMUNITY)
Admission: RE | Admit: 2012-02-19 | Discharge: 2012-02-19 | Disposition: A | Discharge status: Home / Self Care | Payer: BC Managed Care – PPO | Source: Ambulatory Visit | Attending: Oncology | Admitting: Oncology

## 2012-02-19 DIAGNOSIS — Z139 Encounter for screening, unspecified: Secondary | ICD-10-CM

## 2012-02-19 DIAGNOSIS — Z1231 Encounter for screening mammogram for malignant neoplasm of breast: Secondary | ICD-10-CM | POA: Insufficient documentation

## 2012-04-10 DIAGNOSIS — R21 Rash and other nonspecific skin eruption: Secondary | ICD-10-CM

## 2012-04-10 HISTORY — DX: Rash and other nonspecific skin eruption: R21

## 2012-05-09 ENCOUNTER — Encounter (HOSPITAL_COMMUNITY): Payer: BC Managed Care – PPO | Attending: Oncology

## 2012-05-09 DIAGNOSIS — C50919 Malignant neoplasm of unspecified site of unspecified female breast: Secondary | ICD-10-CM | POA: Insufficient documentation

## 2012-05-09 LAB — CBC
HCT: 36 % (ref 36.0–46.0)
Hemoglobin: 12 g/dL (ref 12.0–15.0)
MCH: 30.5 pg (ref 26.0–34.0)
MCHC: 33.3 g/dL (ref 30.0–36.0)
MCV: 91.4 fL (ref 78.0–100.0)
Platelets: 296 10*3/uL (ref 150–400)
RBC: 3.94 MIL/uL (ref 3.87–5.11)
RDW: 12.3 % (ref 11.5–15.5)
WBC: 4.7 10*3/uL (ref 4.0–10.5)

## 2012-05-09 LAB — COMPREHENSIVE METABOLIC PANEL
ALT: 20 U/L (ref 0–35)
AST: 23 U/L (ref 0–37)
Albumin: 3.7 g/dL (ref 3.5–5.2)
Alkaline Phosphatase: 111 U/L (ref 39–117)
BUN: 14 mg/dL (ref 6–23)
CO2: 32 mEq/L (ref 19–32)
Calcium: 10.2 mg/dL (ref 8.4–10.5)
Chloride: 101 mEq/L (ref 96–112)
Creatinine, Ser: 0.81 mg/dL (ref 0.50–1.10)
GFR calc Af Amer: >90 mL/min (ref >90)
GFR calc non Af Amer: 80 mL/min — ABNORMAL LOW (ref >90)
Glucose, Bld: 81 mg/dL (ref 70–99)
Potassium: 3.2 mEq/L — ABNORMAL LOW (ref 3.5–5.1)
Sodium: 141 mEq/L (ref 135–145)
Total Bilirubin: 0.5 mg/dL (ref 0.3–1.2)
Total Protein: 8 g/dL (ref 6.0–8.3)

## 2012-05-09 LAB — DIFFERENTIAL
Basophils Absolute: 0 10*3/uL (ref 0.0–0.1)
Basophils Relative: 0 % (ref 0–1)
Eosinophils Absolute: 0.3 10*3/uL (ref 0.0–0.7)
Eosinophils Relative: 5 % (ref 0–5)
Lymphocytes Relative: 36 % (ref 12–46)
Lymphs Abs: 1.7 10*3/uL (ref 0.7–4.0)
Monocytes Absolute: 0.4 10*3/uL (ref 0.1–1.0)
Monocytes Relative: 9 % (ref 3–12)
Neutro Abs: 2.3 10*3/uL (ref 1.7–7.7)
Neutrophils Relative %: 50 % (ref 43–77)

## 2012-05-09 NOTE — Progress Notes (Signed)
Labs drawn today for cbc,diff,cmp 

## 2012-05-14 ENCOUNTER — Encounter (HOSPITAL_COMMUNITY): Payer: BC Managed Care – PPO | Attending: Oncology | Admitting: Oncology

## 2012-05-14 ENCOUNTER — Encounter (HOSPITAL_COMMUNITY): Payer: Self-pay | Admitting: Oncology

## 2012-05-14 VITALS — BP 123/70 | HR 73 | Temp 97.7°F | Resp 18 | Wt 166.9 lb

## 2012-05-14 DIAGNOSIS — R21 Rash and other nonspecific skin eruption: Secondary | ICD-10-CM

## 2012-05-14 DIAGNOSIS — Z853 Personal history of malignant neoplasm of breast: Secondary | ICD-10-CM

## 2012-05-14 DIAGNOSIS — E876 Hypokalemia: Secondary | ICD-10-CM

## 2012-05-14 DIAGNOSIS — C50919 Malignant neoplasm of unspecified site of unspecified female breast: Secondary | ICD-10-CM | POA: Insufficient documentation

## 2012-05-14 MED ORDER — POTASSIUM CHLORIDE CRYS ER 20 MEQ PO TBCR: 20.0000 meq | EXTENDED_RELEASE_TABLET | Freq: Every day | ORAL | Status: DC

## 2012-05-14 NOTE — Progress Notes (Signed)
Problem #1 multifocal breast cancer left side ER/PR negative HER-2/neu over amplified with 2 positive axillary lymph nodes but negative margins no LV I and she was diagnosed in April 2006 with surgery on 01/15/2005 consisting of a simple mastectomy, left axillary sentinel node dissection. She received adjuvant chemotherapy consisting of Taxotere and Cytoxan in a dose dense fashion x4 cycles with Herceptin for 52 weeks. All 3 drugs were started concomitantly. She is here for routine followup. She does have a rash now on her abdomen right thigh left by back both upper and lower areas. She has had this since June has tried doxycycline without any improvement as well as an antifungal cream with minimal to no improvement. Is not symptomatic but is still present. She would like to see a dermatologist and we will send her to one. Her review of systems otherwise is noncontributory. She is up-to-date on colonoscopies. She is still fully functional. She has normal bowel function normal GU function etc. She is not short of breath or having chest pain  Vital signs are in the chart. She's continued to lose weight which is fabulous. She is really watching her diet. She has no lymphadenopathy in the cervical supraclavicular, infraclavicular, axillary, or inguinal areas. Her abdomen is soft nontender without hepatosplenomegaly. Bowel sounds are normal. She has no arm or leg edema. This rash really does look like tinea. It has advancing margins with some flaking of the skin anteriorly. She has a negative chest wall where her reconstructed breast is present. The right breast is negative. Her lungs are clear. Heart shows a regular rhythm and rate without murmur rub or gallop.  We will see her back in 12 months sooner if need be. Her lab work you the day showed mild but definite hypokalemia and I've increased her potassium 10 mEq a day to 20 mEq a day and we will check her potassium 3 months. We have also called her in a new  prescription via the computer  We will also get her an appointment with the dermatologist.

## 2012-05-14 NOTE — Patient Instructions (Signed)
[facility]  Discharge Instructions NUALA CHILES  DOB Feb 22, 1956 CSN [id number]  MRN [id number] Dr. Glenford Peers RECOMMENDATIONS MADE BY THE CONSULTANT AND ANY TEST RESULTS WILL BE SENT TO YOUR REFERRING DOCTOR.   EXAM FINDINGS BY MD TODAY AND SIGNS AND SYMPTOMS TO REPORT TO CLINIC OR PRIMARY MD: Exam good We would like for you to see a Skin dr about your rash  MEDICATIONS PRESCRIBED: Kdur 20 meq po daily   INSTRUCTIONS GIVEN AND DISCUSSED: We will recheck potassium in December and other labs in 1 year  SPECIAL INSTRUCTIONS/FOLLOW-UP: See [place] back in 1 year   I acknowledge that I have been informed and understand all the instructions given to me and received a copy. I do not have any more questions at this time, but understand that I may call the Specialty Clinic at [facility] at [phone] during business hours should I have any further questions or need assistance in obtaining follow-up care.    __________________________________________  _____________  __________ Signature of Patient or Authorized Representative            Date                   Time    __________________________________________ Nurse's Signature

## 2012-08-13 ENCOUNTER — Encounter (HOSPITAL_COMMUNITY): Payer: BC Managed Care – PPO | Attending: Oncology

## 2012-08-13 DIAGNOSIS — C50919 Malignant neoplasm of unspecified site of unspecified female breast: Secondary | ICD-10-CM | POA: Insufficient documentation

## 2012-08-13 DIAGNOSIS — E876 Hypokalemia: Secondary | ICD-10-CM | POA: Insufficient documentation

## 2012-08-13 LAB — BASIC METABOLIC PANEL
BUN: 16 mg/dL (ref 6–23)
CO2: 30 mEq/L (ref 19–32)
Calcium: 10 mg/dL (ref 8.4–10.5)
Chloride: 100 mEq/L (ref 96–112)
Creatinine, Ser: 0.79 mg/dL (ref 0.50–1.10)
GFR calc Af Amer: >90 mL/min (ref >90)
GFR calc non Af Amer: >90 mL/min (ref >90)
Glucose, Bld: 82 mg/dL (ref 70–99)
Potassium: 3.5 mEq/L (ref 3.5–5.1)
Sodium: 138 mEq/L (ref 135–145)

## 2012-08-13 NOTE — Progress Notes (Signed)
Labs drawn today for bmp 

## 2013-03-09 ENCOUNTER — Other Ambulatory Visit (HOSPITAL_COMMUNITY): Payer: Self-pay | Admitting: Family Medicine

## 2013-03-09 DIAGNOSIS — Z139 Encounter for screening, unspecified: Secondary | ICD-10-CM

## 2013-03-10 ENCOUNTER — Ambulatory Visit (HOSPITAL_COMMUNITY)
Admission: RE | Admit: 2013-03-10 | Discharge: 2013-03-10 | Disposition: A | Discharge status: Home / Self Care | Payer: BC Managed Care – PPO | Source: Ambulatory Visit | Attending: Family Medicine | Admitting: Family Medicine

## 2013-03-10 ENCOUNTER — Other Ambulatory Visit (HOSPITAL_COMMUNITY): Payer: Self-pay | Admitting: Pulmonary Disease

## 2013-03-10 DIAGNOSIS — Z1231 Encounter for screening mammogram for malignant neoplasm of breast: Secondary | ICD-10-CM | POA: Insufficient documentation

## 2013-03-10 DIAGNOSIS — Z139 Encounter for screening, unspecified: Secondary | ICD-10-CM

## 2013-03-17 ENCOUNTER — Other Ambulatory Visit: Payer: Self-pay | Admitting: Family Medicine

## 2013-03-17 DIAGNOSIS — R928 Other abnormal and inconclusive findings on diagnostic imaging of breast: Secondary | ICD-10-CM

## 2013-03-25 ENCOUNTER — Ambulatory Visit (HOSPITAL_COMMUNITY)
Admission: RE | Admit: 2013-03-25 | Discharge: 2013-03-25 | Disposition: A | Discharge status: Home / Self Care | Payer: BC Managed Care – PPO | Source: Ambulatory Visit | Attending: Family Medicine | Admitting: Family Medicine

## 2013-03-25 DIAGNOSIS — R928 Other abnormal and inconclusive findings on diagnostic imaging of breast: Secondary | ICD-10-CM | POA: Insufficient documentation

## 2013-05-08 ENCOUNTER — Other Ambulatory Visit (HOSPITAL_COMMUNITY): Payer: BC Managed Care – PPO

## 2013-05-13 ENCOUNTER — Encounter (HOSPITAL_COMMUNITY): Payer: Self-pay

## 2013-05-13 ENCOUNTER — Telehealth (HOSPITAL_COMMUNITY): Payer: Self-pay

## 2013-05-13 ENCOUNTER — Encounter (HOSPITAL_COMMUNITY): Payer: BC Managed Care – PPO | Attending: Hematology and Oncology

## 2013-05-13 ENCOUNTER — Ambulatory Visit (HOSPITAL_COMMUNITY): Payer: BC Managed Care – PPO

## 2013-05-13 VITALS — BP 127/77 | HR 68 | Temp 98.3°F | Resp 15 | Wt 167.7 lb

## 2013-05-13 DIAGNOSIS — C50912 Malignant neoplasm of unspecified site of left female breast: Secondary | ICD-10-CM

## 2013-05-13 DIAGNOSIS — Z853 Personal history of malignant neoplasm of breast: Secondary | ICD-10-CM

## 2013-05-13 NOTE — Patient Instructions (Addendum)
[facility] Discharge Instructions  RECOMMENDATIONS MADE BY THE CONSULTANT AND ANY TEST RESULTS WILL BE SENT TO YOUR REFERRING PHYSICIAN.  EXAM FINDINGS BY THE PHYSICIAN TODAY AND SIGNS OR SYMPTOMS TO REPORT TO CLINIC OR PRIMARY PHYSICIAN: Exam and discussion by Dr. Lazarus Salines.  No evidence of recurrence by exam.  MEDICATIONS PRESCRIBED:  none  INSTRUCTIONS GIVEN AND DISCUSSED: Report any new lumps, bone pain, shortness of breath or other symptoms.  SPECIAL INSTRUCTIONS/FOLLOW-UP: Follow-up in 1 year.  Thank you for choosing [facility] to provide your oncology and hematology care.  To afford each patient quality time with our providers, please arrive at least 15 minutes before your scheduled appointment time.  With your help, our goal is to use those 15 minutes to complete the necessary work-up to ensure our physicians have the information they need to help with your evaluation and healthcare recommendations.    Effective January 1st, 2014, we ask that you re-schedule your appointment with our physicians should you arrive 10 or more minutes late for your appointment.  We strive to give you quality time with our providers, and arriving late affects you and other patients whose appointments are after yours.    Again, thank you for choosing [facility].  Our hope is that these requests will decrease the amount of time that you wait before being seen by our physicians.       _____________________________________________________________  Should you have questions after your visit to [facility], please contact our office at [phone] between the hours of 8:30 a.m. and 5:00 p.m.  Voicemails left after 4:30 p.m. will not be returned until the following business day.  For prescription refill requests, have your pharmacy contact our office with your prescription refill request.

## 2013-05-13 NOTE — Telephone Encounter (Signed)
Spoke with patient regarding BRCA 1 & BRCA 2 testing.  Is not interested in doing this at this time.  Will let us know if she changes her mind.

## 2013-05-13 NOTE — Telephone Encounter (Signed)
Thanks for giving her a call . Please document your telephone  encounter for medicolegal reasons.

## 2013-05-13 NOTE — Progress Notes (Signed)
[facility] [phone]   [phone]  OFFICE PROGRESS NOTE  Milana Obey, MD [address] [place] [address]  DIAGNOSIS: Carcinoma of the left breast   INTERVAL HISTORY:    according to Dr. Thornton Papas note "multifocal breast cancer left side ER/PR negative HER-2/neu over amplified with 2 positive axillary lymph nodes but negative margins no LV I and she was diagnosed in April 2006 with surgery on 01/15/2005 consisting of a simple mastectomy, left axillary sentinel node dissection. She received adjuvant chemotherapy consisting of Taxotere and Cytoxan in a dose dense fashion x4 cycles with Herceptin for 52 weeks. All 3 drugs were started concomitantly"  Stephanie Galvan 56 y.o. female returns to the clinic today for scheduled a routine yearly follow up..  She tells me she feels well and denies any new problems.  She denies any breast lumps, no bone pain, no shortness of breath and weight is stable.  Appetite is good.  She had a recent mammogram of the right breast on 03/25/2013 which was read as  BI-RADS 1.  MEDICAL HISTORY: Past Medical History  Diagnosis Date  . Hypertension   . High cholesterol   . Breast cancer   . History of bladder infections   . Rash Aug 2013    ALLERGIES:  has No Known Allergies.  MEDICATIONS:  Current Outpatient Prescriptions  Medication Sig Dispense Refill  . atenolol (TENORMIN) 50 MG tablet Take 50 mg by mouth daily.        . hydrochlorothiazide 25 MG tablet Take 25 mg by mouth daily.        . potassium chloride SA (K-DUR,KLOR-CON) 20 MEQ tablet Take 1 tablet (20 mEq total) by mouth daily.  90 tablet  3   No current facility-administered medications for this visit.    SURGICAL HISTORY:  Past Surgical History  Procedure Laterality Date  . Mastectomy  5/06    left  . Breast biopsy    . Partial hysterectomy    . Portacath placement    . Port-a-cath removal     Family history:  history of breast cancer in her cousin in her 20s.   REVIEW OF SYSTEMS: 14 point review of system is as in the history above otherwise negative.  PHYSICAL EXAMINATION:  Blood pressure 127/77, pulse 68, temperature 98.3 F (36.8 C), temperature source Oral, resp. rate 15, weight 167 lb 11.2 oz (76.068 kg). GENERAL: No acute distress. SKIN:  No rashes or significant lesions . No ecchymosis or petechial rash. HEAD: Normocephalic, No masses, lesions, tenderness or abnormalities  EYES: Conjunctiva are pink and non-injected and no jaundice LYMPH: No palpable lymphadenopathy, in the neck, supraclavicular areas or axilla. BREAST:Normal right breast without mass, reconstructed left breast is also without evidence of local recurrence . LUNGS: Clear to auscultation , no crackles or wheezes HEART: regular rate & rhythm, no murmurs, no gallops, S1 normal and S2 normal and no S3. ABDOMEN: Abdomen soft, non-tender, no masses or organomegaly and no hepatosplenomegaly palpable EXTREMITIES: No edema, no skin discoloration or tenderness NEURO: Alert & oriented , no focal motor  deficits.     LABORATORY DATA: Lab Results  Component Value Date   WBC 4.7 05/09/2012   HGB 12.0 05/09/2012   HCT 36.0 05/09/2012   MCV 91.4 05/09/2012   PLT 296 05/09/2012      Chemistry      Component Value Date/Time   NA 138 08/13/2012 1012   K  3.5 08/13/2012 1012   CL 100 08/13/2012 1012   CO2 30 08/13/2012 1012   BUN 16 08/13/2012 1012   CREATININE 0.79 08/13/2012 1012      Component Value Date/Time   CALCIUM 10.0 08/13/2012 1012   ALKPHOS 111 05/09/2012 0844   AST 23 05/09/2012 0844   ALT 20 05/09/2012 0844   BILITOT 0.5 05/09/2012 0844       RADIOGRAPHIC STUDIES: No results found.   ASSESSMENT:  Ms. Trowbridge has no evidence of recurrence at this time.  Given how far out she is risk of recurrence is a small for hormone receptor negative cancer.  However we also discussed the giving that she previously diagnosis of  cancer she remains at increased risk of and another cancer.  Her mammogram is unremarkable and physical exam is also remarkable.   PLAN:  1. Return to clinic in one year . 2. Continue yearly mammogram. 3.  Will ask patient if  interested in being referred for genetic counseling for BRCA1 and 2 given cancer breast cancer at <50 yr and family history in 1st degree relative. Discussed with Geralynn Ochs RN, will call patient.    All questions were satisfactorily answered. Patient knows to call if  any concern arises.  I spent more than 50 % counseling the patient face to face. The total time spent in the appointment was 30 minutes.   Sherral Hammers, MD FACP. Hematology/Oncology.

## 2013-05-14 NOTE — Telephone Encounter (Signed)
Thank you so much. Ike.

## 2013-05-14 NOTE — Telephone Encounter (Signed)
Note was entered as phone call when call was made and is in chart already.

## 2014-02-18 ENCOUNTER — Other Ambulatory Visit (HOSPITAL_COMMUNITY): Payer: Self-pay | Admitting: Family Medicine

## 2014-02-18 DIAGNOSIS — Z1239 Encounter for other screening for malignant neoplasm of breast: Secondary | ICD-10-CM

## 2014-03-29 ENCOUNTER — Ambulatory Visit (HOSPITAL_COMMUNITY)
Admission: RE | Admit: 2014-03-29 | Discharge: 2014-03-29 | Disposition: A | Discharge status: Home / Self Care | Payer: BC Managed Care – PPO | Source: Ambulatory Visit | Attending: Family Medicine | Admitting: Family Medicine

## 2014-03-29 DIAGNOSIS — Z1239 Encounter for other screening for malignant neoplasm of breast: Secondary | ICD-10-CM

## 2014-03-29 DIAGNOSIS — Z1231 Encounter for screening mammogram for malignant neoplasm of breast: Secondary | ICD-10-CM | POA: Insufficient documentation

## 2014-03-29 DIAGNOSIS — Z853 Personal history of malignant neoplasm of breast: Secondary | ICD-10-CM | POA: Insufficient documentation

## 2014-05-10 ENCOUNTER — Telehealth: Payer: Self-pay

## 2014-05-10 NOTE — Telephone Encounter (Signed)
Pt was referred by Dr. Karie Kirks for a colonoscopy. Her last one was 05/09/2010 by Dr. Oneida Alar and her next was recommended in 10 years.  Pt said she is not having any problems and no new hx of colon cancer.  She is aware that she is on recall for 05/2020 and will be contacted.  She will call if she needs anything before then. Sending Dr. Karie Kirks a letter.

## 2014-05-11 ENCOUNTER — Ambulatory Visit (HOSPITAL_COMMUNITY): Payer: BC Managed Care – PPO

## 2014-05-13 ENCOUNTER — Ambulatory Visit (HOSPITAL_COMMUNITY): Payer: BC Managed Care – PPO

## 2014-05-14 NOTE — Progress Notes (Signed)
This encounter was created in error - please disregard.

## 2014-05-31 DIAGNOSIS — I10 Essential (primary) hypertension: Secondary | ICD-10-CM | POA: Insufficient documentation

## 2014-05-31 DIAGNOSIS — C50912 Malignant neoplasm of unspecified site of left female breast: Secondary | ICD-10-CM | POA: Insufficient documentation

## 2015-04-26 ENCOUNTER — Encounter (HOSPITAL_COMMUNITY): Payer: Self-pay | Admitting: Emergency Medicine

## 2015-04-26 ENCOUNTER — Emergency Department (HOSPITAL_COMMUNITY): Payer: 59

## 2015-04-26 ENCOUNTER — Emergency Department (HOSPITAL_COMMUNITY)
Admission: EM | Admit: 2015-04-26 | Discharge: 2015-04-26 | Disposition: A | Discharge status: Home / Self Care | Payer: 59 | Attending: Emergency Medicine | Admitting: Emergency Medicine

## 2015-04-26 DIAGNOSIS — I10 Essential (primary) hypertension: Secondary | ICD-10-CM | POA: Insufficient documentation

## 2015-04-26 DIAGNOSIS — S0990XA Unspecified injury of head, initial encounter: Secondary | ICD-10-CM | POA: Diagnosis not present

## 2015-04-26 DIAGNOSIS — S0993XA Unspecified injury of face, initial encounter: Secondary | ICD-10-CM | POA: Diagnosis not present

## 2015-04-26 DIAGNOSIS — S3992XA Unspecified injury of lower back, initial encounter: Secondary | ICD-10-CM | POA: Diagnosis not present

## 2015-04-26 DIAGNOSIS — Y9389 Activity, other specified: Secondary | ICD-10-CM | POA: Insufficient documentation

## 2015-04-26 DIAGNOSIS — Y998 Other external cause status: Secondary | ICD-10-CM | POA: Diagnosis not present

## 2015-04-26 DIAGNOSIS — Z8639 Personal history of other endocrine, nutritional and metabolic disease: Secondary | ICD-10-CM | POA: Diagnosis not present

## 2015-04-26 DIAGNOSIS — R040 Epistaxis: Secondary | ICD-10-CM | POA: Insufficient documentation

## 2015-04-26 DIAGNOSIS — S022XXA Fracture of nasal bones, initial encounter for closed fracture: Secondary | ICD-10-CM | POA: Diagnosis not present

## 2015-04-26 DIAGNOSIS — Z853 Personal history of malignant neoplasm of breast: Secondary | ICD-10-CM | POA: Insufficient documentation

## 2015-04-26 DIAGNOSIS — S199XXA Unspecified injury of neck, initial encounter: Secondary | ICD-10-CM | POA: Diagnosis not present

## 2015-04-26 DIAGNOSIS — S29002A Unspecified injury of muscle and tendon of back wall of thorax, initial encounter: Secondary | ICD-10-CM | POA: Insufficient documentation

## 2015-04-26 DIAGNOSIS — Y92410 Unspecified street and highway as the place of occurrence of the external cause: Secondary | ICD-10-CM | POA: Insufficient documentation

## 2015-04-26 DIAGNOSIS — Z79899 Other long term (current) drug therapy: Secondary | ICD-10-CM | POA: Diagnosis not present

## 2015-04-26 DIAGNOSIS — S0992XA Unspecified injury of nose, initial encounter: Secondary | ICD-10-CM | POA: Diagnosis present

## 2015-04-26 DIAGNOSIS — Z87440 Personal history of urinary (tract) infections: Secondary | ICD-10-CM | POA: Diagnosis not present

## 2015-04-26 LAB — CBC WITH DIFFERENTIAL/PLATELET
Basophils Absolute: 0 10*3/uL (ref 0.0–0.1)
Basophils Relative: 0 % (ref 0–1)
Eosinophils Absolute: 0.1 10*3/uL (ref 0.0–0.7)
Eosinophils Relative: 1 % (ref 0–5)
HCT: 37.7 % (ref 36.0–46.0)
Hemoglobin: 12.4 g/dL (ref 12.0–15.0)
Lymphocytes Relative: 20 % (ref 12–46)
Lymphs Abs: 1.4 10*3/uL (ref 0.7–4.0)
MCH: 30.7 pg (ref 26.0–34.0)
MCHC: 32.9 g/dL (ref 30.0–36.0)
MCV: 93.3 fL (ref 78.0–100.0)
Monocytes Absolute: 0.4 10*3/uL (ref 0.1–1.0)
Monocytes Relative: 6 % (ref 3–12)
Neutro Abs: 5.1 10*3/uL (ref 1.7–7.7)
Neutrophils Relative %: 73 % (ref 43–77)
Platelets: 233 10*3/uL (ref 150–400)
RBC: 4.04 MIL/uL (ref 3.87–5.11)
RDW: 12.4 % (ref 11.5–15.5)
WBC: 7 10*3/uL (ref 4.0–10.5)

## 2015-04-26 LAB — BASIC METABOLIC PANEL
Anion gap: 8 (ref 5–15)
BUN: 21 mg/dL — ABNORMAL HIGH (ref 6–20)
CO2: 29 mmol/L (ref 22–32)
Calcium: 9.4 mg/dL (ref 8.9–10.3)
Chloride: 101 mmol/L (ref 101–111)
Creatinine, Ser: 0.9 mg/dL (ref 0.44–1.00)
GFR calc Af Amer: >60 mL/min (ref >60)
GFR calc non Af Amer: >60 mL/min (ref >60)
Glucose, Bld: 102 mg/dL — ABNORMAL HIGH (ref 65–99)
Potassium: 3.7 mmol/L (ref 3.5–5.1)
Sodium: 138 mmol/L (ref 135–145)

## 2015-04-26 MED ORDER — FENTANYL CITRATE (PF) 100 MCG/2ML IJ SOLN
50.0000 ug | Freq: Once | INTRAMUSCULAR | Status: AC
  Administered 2015-04-26: 50 ug via INTRAVENOUS
  Filled 2015-04-26: qty 2

## 2015-04-26 MED ORDER — IBUPROFEN 800 MG PO TABS
800.0000 mg | ORAL_TABLET | Freq: Three times a day (TID) | ORAL | Status: DC
Start: 2015-04-26 — End: 2019-06-01

## 2015-04-26 MED ORDER — IOHEXOL 300 MG/ML  SOLN
100.0000 mL | Freq: Once | INTRAMUSCULAR | Status: AC | PRN
  Administered 2015-04-26: 100 mL via INTRAVENOUS

## 2015-04-26 MED ORDER — OXYMETAZOLINE HCL 0.05 % NA SOLN: 1.0000 | Freq: Two times a day (BID) | NASAL | Status: DC

## 2015-04-26 NOTE — ED Notes (Signed)
Patient with no complaints at this time. Respirations even and unlabored. Skin warm/dry. Discharge instructions reviewed with patient at this time. Patient given opportunity to voice concerns/ask questions. IV removed per policy and band-aid applied to site. Patient discharged at this time and left Emergency Department with steady gait.  
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
## 2015-04-26 NOTE — Discharge Instructions (Signed)
Facial Fracture A facial fracture is a break in one of the bones of your face. HOME CARE INSTRUCTIONS   Protect the injured part of your face until it is healed.  Do not participate in activities which give chance for re-injury until your doctor approves.  Gently wash and dry your face.  Wear head and facial protection while riding a bicycle, motorcycle, or snowmobile. SEEK MEDICAL CARE IF:   An oral temperature above 102 F (38.9 C) develops.  You have severe headaches or notice changes in your vision.  You have new numbness or tingling in your face.  You develop nausea (feeling sick to your stomach), vomiting or a stiff neck. SEEK IMMEDIATE MEDICAL CARE IF:   You develop difficulty seeing or experience double vision.  You become dizzy, lightheaded, or faint.  You develop trouble speaking, breathing, or swallowing.  You have a watery discharge from your nose or ear. MAKE SURE YOU:   Understand these instructions.  Will watch your condition.  Will get help right away if you are not doing well or get worse. Document Released: 08/27/2005 Document Revised: 11/19/2011 Document Reviewed: 04/15/2008 [facility] Patient Information 2015 [place], [place]. This information is not intended to replace advice given to you by your health care provider. Make sure you discuss any questions you have with your health care provider.

## 2015-04-26 NOTE — ED Notes (Signed)
Patient ambulated to bathroom with no assistance or difficulty. Ice pack given to patient. Patient cleaned bilateral nasal passages with normal saline and q-tip swabs.
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
## 2015-05-18 ENCOUNTER — Other Ambulatory Visit (HOSPITAL_COMMUNITY): Payer: Self-pay | Admitting: Family Medicine

## 2015-05-18 DIAGNOSIS — Z1231 Encounter for screening mammogram for malignant neoplasm of breast: Secondary | ICD-10-CM

## 2015-05-19 ENCOUNTER — Ambulatory Visit (HOSPITAL_COMMUNITY)
Admission: RE | Admit: 2015-05-19 | Discharge: 2015-05-19 | Disposition: A | Discharge status: Home / Self Care | Payer: 59 | Source: Ambulatory Visit | Attending: Family Medicine | Admitting: Family Medicine

## 2015-05-19 DIAGNOSIS — Z1231 Encounter for screening mammogram for malignant neoplasm of breast: Secondary | ICD-10-CM | POA: Diagnosis not present

## 2016-05-07 ENCOUNTER — Other Ambulatory Visit (HOSPITAL_COMMUNITY): Payer: Self-pay | Admitting: Family Medicine

## 2016-05-07 DIAGNOSIS — Z1231 Encounter for screening mammogram for malignant neoplasm of breast: Secondary | ICD-10-CM

## 2016-05-21 ENCOUNTER — Ambulatory Visit (HOSPITAL_COMMUNITY)
Admission: RE | Admit: 2016-05-21 | Discharge: 2016-05-21 | Disposition: A | Discharge status: Home / Self Care | Payer: BLUE CROSS/BLUE SHIELD | Source: Ambulatory Visit | Attending: Family Medicine | Admitting: Family Medicine

## 2016-05-21 DIAGNOSIS — Z1231 Encounter for screening mammogram for malignant neoplasm of breast: Secondary | ICD-10-CM | POA: Diagnosis not present

## 2017-05-01 ENCOUNTER — Other Ambulatory Visit (HOSPITAL_COMMUNITY): Payer: Self-pay | Admitting: Family Medicine

## 2017-05-01 DIAGNOSIS — Z1231 Encounter for screening mammogram for malignant neoplasm of breast: Secondary | ICD-10-CM

## 2017-05-23 ENCOUNTER — Encounter (HOSPITAL_COMMUNITY): Payer: Self-pay

## 2017-05-23 ENCOUNTER — Ambulatory Visit (HOSPITAL_COMMUNITY)
Admission: RE | Admit: 2017-05-23 | Discharge: 2017-05-23 | Disposition: A | Discharge status: Home / Self Care | Payer: BLUE CROSS/BLUE SHIELD | Source: Ambulatory Visit | Attending: Family Medicine | Admitting: Family Medicine

## 2017-05-23 DIAGNOSIS — Z1231 Encounter for screening mammogram for malignant neoplasm of breast: Secondary | ICD-10-CM | POA: Diagnosis not present

## 2018-04-17 ENCOUNTER — Other Ambulatory Visit (HOSPITAL_COMMUNITY): Payer: Self-pay | Admitting: Family Medicine

## 2018-04-17 DIAGNOSIS — Z1231 Encounter for screening mammogram for malignant neoplasm of breast: Secondary | ICD-10-CM

## 2018-05-28 ENCOUNTER — Ambulatory Visit (HOSPITAL_COMMUNITY)
Admission: RE | Admit: 2018-05-28 | Discharge: 2018-05-28 | Disposition: A | Discharge status: Home / Self Care | Payer: BLUE CROSS/BLUE SHIELD | Source: Ambulatory Visit | Attending: Family Medicine | Admitting: Family Medicine

## 2018-05-28 ENCOUNTER — Other Ambulatory Visit (HOSPITAL_COMMUNITY): Payer: Self-pay | Admitting: Family Medicine

## 2018-05-28 DIAGNOSIS — Z1231 Encounter for screening mammogram for malignant neoplasm of breast: Secondary | ICD-10-CM | POA: Diagnosis present

## 2018-05-28 DIAGNOSIS — R928 Other abnormal and inconclusive findings on diagnostic imaging of breast: Secondary | ICD-10-CM

## 2018-06-03 ENCOUNTER — Ambulatory Visit (HOSPITAL_COMMUNITY)
Admission: RE | Admit: 2018-06-03 | Discharge: 2018-06-03 | Disposition: A | Discharge status: Home / Self Care | Payer: BLUE CROSS/BLUE SHIELD | Source: Ambulatory Visit | Attending: Family Medicine | Admitting: Family Medicine

## 2018-06-03 DIAGNOSIS — R928 Other abnormal and inconclusive findings on diagnostic imaging of breast: Secondary | ICD-10-CM

## 2018-06-25 ENCOUNTER — Other Ambulatory Visit: Payer: Self-pay | Admitting: Family Medicine

## 2018-06-25 DIAGNOSIS — R928 Other abnormal and inconclusive findings on diagnostic imaging of breast: Secondary | ICD-10-CM

## 2018-07-01 ENCOUNTER — Ambulatory Visit
Admission: RE | Admit: 2018-07-01 | Discharge: 2018-07-01 | Disposition: A | Discharge status: Home / Self Care | Payer: BLUE CROSS/BLUE SHIELD | Source: Ambulatory Visit | Attending: Family Medicine | Admitting: Family Medicine

## 2018-07-01 DIAGNOSIS — R928 Other abnormal and inconclusive findings on diagnostic imaging of breast: Secondary | ICD-10-CM

## 2018-07-11 ENCOUNTER — Other Ambulatory Visit: Payer: Self-pay | Admitting: Surgery

## 2018-07-11 DIAGNOSIS — N6021 Fibroadenosis of right breast: Secondary | ICD-10-CM

## 2018-07-16 ENCOUNTER — Encounter (HOSPITAL_BASED_OUTPATIENT_CLINIC_OR_DEPARTMENT_OTHER): Payer: Self-pay | Admitting: *Deleted

## 2018-07-16 ENCOUNTER — Other Ambulatory Visit: Payer: Self-pay

## 2018-07-17 ENCOUNTER — Encounter (HOSPITAL_BASED_OUTPATIENT_CLINIC_OR_DEPARTMENT_OTHER)
Admission: RE | Admit: 2018-07-17 | Discharge: 2018-07-17 | Disposition: A | Discharge status: Home / Self Care | Payer: BLUE CROSS/BLUE SHIELD | Source: Ambulatory Visit | Attending: Surgery | Admitting: Surgery

## 2018-07-17 DIAGNOSIS — Z01818 Encounter for other preprocedural examination: Secondary | ICD-10-CM | POA: Diagnosis present

## 2018-07-17 LAB — BASIC METABOLIC PANEL
Anion gap: 11 (ref 5–15)
BUN: 16 mg/dL (ref 8–23)
CO2: 27 mmol/L (ref 22–32)
Calcium: 9.2 mg/dL (ref 8.9–10.3)
Chloride: 99 mmol/L (ref 98–111)
Creatinine, Ser: 0.79 mg/dL (ref 0.44–1.00)
GFR calc Af Amer: >60 mL/min (ref >60)
GFR calc non Af Amer: >60 mL/min (ref >60)
Glucose, Bld: 104 mg/dL — ABNORMAL HIGH (ref 70–99)
Potassium: 3.3 mmol/L — ABNORMAL LOW (ref 3.5–5.1)
Sodium: 137 mmol/L (ref 135–145)

## 2018-07-17 NOTE — Progress Notes (Addendum)
Ensure pre surgery drink given with instructions to complete by [facility], pt verbalized understanding.   Lab results reviewed by Dr. Eligha Bridegroom ( K+ 3.3) will proceed with surgery as scheduled.

## 2018-07-22 ENCOUNTER — Ambulatory Visit
Admission: RE | Admit: 2018-07-22 | Discharge: 2018-07-22 | Disposition: A | Discharge status: Home / Self Care | Payer: BLUE CROSS/BLUE SHIELD | Source: Ambulatory Visit | Attending: Surgery | Admitting: Surgery

## 2018-07-22 DIAGNOSIS — N6021 Fibroadenosis of right breast: Secondary | ICD-10-CM

## 2018-07-23 NOTE — H&P (Signed)
Stephanie Galvan Documented: 07/11/2018 9:38 AM Location: [facility] Patient #: [id number] DOB: 04-07-1956 Single / Language: Cleophus Molt / Race: Black or African American Female   History of Present Illness (Sentoria Brent A. Ninfa Linden MD; 07/11/2018 9:57 AM) The patient is a 62 year old female who presents with a breast mass. This is a pleasant patient referred by Dr. Agustina Caroli after the recent diagnosis of a complex sclerosing lesion of the right breast. She had a lesion seen on screening mammography of the right breast. Stereotactic biopsy was performed which showed the complex sclerosing lesion with calcifications. She has a significant past medical history of having had breast cancer in 2006 and is status post a left mastectomy with reconstruction. She is otherwise healthy without complaints. She denies nipple discharge.   Past Surgical History (Tanisha A. Owens Shark, [place]; 07/11/2018 9:38 AM) Hysterectomy (not due to cancer) - Partial  Mastectomy  Left.  Diagnostic Studies History (Tanisha A. Owens Shark, [place]; 07/11/2018 9:38 AM) Colonoscopy  5-10 years ago Mammogram  within last year  Allergies (Tanisha A. Owens Shark, [place]; 07/11/2018 9:39 AM) No Known Drug Allergies [07/11/2018]: Allergies Reconciled   Medication History (Tanisha A. Owens Shark, [place]; 07/11/2018 9:40 AM) hydroCHLOROthiazide (25MG  Tablet, Oral) Active. Potassium Chloride ER (10MEQ Tablet ER, Oral) Active. Atenolol (50MG  Tablet, Oral) Active. Medications Reconciled  Social History (Tanisha A. Owens Shark, [place]; 07/11/2018 9:38 AM) Caffeine use  Carbonated beverages, Coffee, Tea.  Family History (Tanisha A. Owens Shark, RMA; 07/11/2018 9:38 AM) Cancer  Father. Colon Cancer  Brother. Hypertension  Brother, Daughter, Mother, Sister. Malignant Neoplasm Of Pancreas  Brother. Seizure disorder  Brother.  Pregnancy / Birth History (Tanisha A. Owens Shark, [place]; 07/11/2018 9:38 AM) Age at menarche  47 years. Gravida  3 Maternal age   27-20 Para  2  Other Problems (Tanisha A. Owens Shark, [place]; 07/11/2018 9:38 AM) Breast Cancer  Hypercholesterolemia     Review of Systems (Tanisha A. Brown RMA; 07/11/2018 9:38 AM) Skin Not Present- Change in Wart/Mole, Dryness, Hives, Jaundice, New Lesions, Non-Healing Wounds, Rash and Ulcer. HEENT Present- Visual Disturbances and Wears glasses/contact lenses. Not Present- Earache, Hearing Loss, Hoarseness, Nose Bleed, Oral Ulcers, Ringing in the Ears, Seasonal Allergies, Sinus Pain, Sore Throat and Yellow Eyes. Respiratory Not Present- Bloody sputum, Chronic Cough, Difficulty Breathing, Snoring and Wheezing. Breast Not Present- Breast Mass, Breast Pain, Nipple Discharge and Skin Changes. Gastrointestinal Not Present- Abdominal Pain, Bloating, Bloody Stool, Change in Bowel Habits, Chronic diarrhea, Constipation, Difficulty Swallowing, Excessive gas, Gets full quickly at meals, Hemorrhoids, Indigestion, Nausea, Rectal Pain and Vomiting. Female Genitourinary Not Present- Frequency, Nocturia, Painful Urination, Pelvic Pain and Urgency. Musculoskeletal Not Present- Back Pain, Joint Pain, Joint Stiffness, Muscle Pain, Muscle Weakness and Swelling of Extremities. Neurological Not Present- Decreased Memory, Fainting, Headaches, Numbness, Seizures, Tingling, Tremor, Trouble walking and Weakness. Psychiatric Not Present- Anxiety, Bipolar, Change in Sleep Pattern, Depression, Fearful and Frequent crying. Endocrine Not Present- Cold Intolerance, Excessive Hunger, Hair Changes, Heat Intolerance, Hot flashes and New Diabetes. Hematology Present- Easy Bruising. Not Present- Blood Thinners, Excessive bleeding, Gland problems, HIV and Persistent Infections.  Vitals (Tanisha A. Brown RMA; 07/11/2018 9:39 AM) 07/11/2018 9:38 AM Weight: 179.2 lb Height: 69in Body Surface Area: 1.97 m Body Mass Index: 26.46 kg/m  Pulse: 94 (Regular)  BP: 126/88 (Sitting, Left Arm, Standard)       Physical Exam  (Ritesh Opara A. Ninfa Linden MD; 07/11/2018 9:57 AM) General Mental Status-Alert. General Appearance-Consistent with stated age. Hydration-Well hydrated. Voice-Normal.  Head and Neck Head-normocephalic, atraumatic with no lesions or palpable  masses. Trachea-midline. Thyroid Gland Characteristics - normal size and consistency.  Eye Eyeball - Bilateral-Extraocular movements intact. Sclera/Conjunctiva - Bilateral-No scleral icterus.  Chest and Lung Exam Chest and lung exam reveals -quiet, even and easy respiratory effort with no use of accessory muscles and on auscultation, normal breath sounds, no adventitious sounds and normal vocal resonance. Inspection Chest Wall - Normal. Back - normal.  Breast Breast - Left-Symmetric, Non Tender, No Biopsy scars, no Dimpling, No Inflammation, No Lumpectomy scars, No Mastectomy scars, No Peau d' Orange. Breast - Right-Symmetric, Non Tender, No Biopsy scars, no Dimpling, No Inflammation, No Lumpectomy scars, No Mastectomy scars, No Peau d' Orange. Breast Lump-No Palpable Breast Mass. Note: There is mild bruising and ecchymosis with small hematoma from the biopsy site on the right breast.   Cardiovascular Cardiovascular examination reveals -normal heart sounds, regular rate and rhythm with no murmurs and normal pedal pulses bilaterally.  Abdomen Inspection Inspection of the abdomen reveals - No Hernias. Skin - Scar - no surgical scars. Palpation/Percussion Palpation and Percussion of the abdomen reveal - Soft, Non Tender, No Rebound tenderness, No Rigidity (guarding) and No hepatosplenomegaly. Auscultation Auscultation of the abdomen reveals - Bowel sounds normal.  Neurologic - Did not examine.  Musculoskeletal - Did not examine.  Lymphatic Head & Neck  General Head & Neck Lymphatics: Bilateral - Description - Normal. Axillary  General Axillary Region: Bilateral - Description - Normal. Tenderness - Non  Tender. Femoral & Inguinal  Generalized Femoral & Inguinal Lymphatics: Bilateral - Description - Normal. Tenderness - Non Tender.    Assessment & Plan (Valia Wingard A. Ninfa Linden MD; 07/11/2018 9:58 AM) SCLEROSING ADENOSIS OF BREAST, RIGHT (N60.21) Impression: I discussed the diagnosis of a complex sclerosing lesion of the right breast with the patient and her husband. I gave him a copy of the pathology results. Given her previous history of left breast cancer as well as the findings on the biopsy, a radioactive seed guided right breast lumpectomy is recommended. I discussed the surgical procedure in detail with the patient and her husband. I discussed the risk of surgery which includes but is not limited to bleeding, infection, the need for further surgery following this he is found, injury to surrounding structures, cardiopulmonary issues, postoperative recovery, etc. They understand and wish to proceed with surgery which will be scheduled

## 2018-07-24 ENCOUNTER — Ambulatory Visit (HOSPITAL_BASED_OUTPATIENT_CLINIC_OR_DEPARTMENT_OTHER): Payer: BLUE CROSS/BLUE SHIELD | Admitting: Anesthesiology

## 2018-07-24 ENCOUNTER — Ambulatory Visit
Admission: RE | Admit: 2018-07-24 | Discharge: 2018-07-24 | Disposition: A | Discharge status: Home / Self Care | Payer: BLUE CROSS/BLUE SHIELD | Source: Ambulatory Visit | Attending: Surgery | Admitting: Surgery

## 2018-07-24 ENCOUNTER — Encounter (HOSPITAL_BASED_OUTPATIENT_CLINIC_OR_DEPARTMENT_OTHER): Payer: Self-pay

## 2018-07-24 ENCOUNTER — Other Ambulatory Visit: Payer: Self-pay

## 2018-07-24 ENCOUNTER — Ambulatory Visit (HOSPITAL_BASED_OUTPATIENT_CLINIC_OR_DEPARTMENT_OTHER)
Admission: RE | Admit: 2018-07-24 | Discharge: 2018-07-24 | Disposition: A | Discharge status: Home / Self Care | Payer: BLUE CROSS/BLUE SHIELD | Source: Ambulatory Visit | Attending: Surgery | Admitting: Surgery

## 2018-07-24 ENCOUNTER — Encounter (HOSPITAL_BASED_OUTPATIENT_CLINIC_OR_DEPARTMENT_OTHER)
Admission: RE | Disposition: A | Discharge status: Home / Self Care | Payer: Self-pay | Source: Ambulatory Visit | Attending: Surgery

## 2018-07-24 DIAGNOSIS — N6021 Fibroadenosis of right breast: Secondary | ICD-10-CM | POA: Diagnosis not present

## 2018-07-24 DIAGNOSIS — Z79899 Other long term (current) drug therapy: Secondary | ICD-10-CM | POA: Insufficient documentation

## 2018-07-24 DIAGNOSIS — E7800 Pure hypercholesterolemia, unspecified: Secondary | ICD-10-CM | POA: Insufficient documentation

## 2018-07-24 DIAGNOSIS — Z853 Personal history of malignant neoplasm of breast: Secondary | ICD-10-CM | POA: Insufficient documentation

## 2018-07-24 HISTORY — DX: Unspecified lump in the right breast, unspecified quadrant: N63.10

## 2018-07-24 HISTORY — PX: BREAST LUMPECTOMY WITH RADIOACTIVE SEED LOCALIZATION: SHX6424

## 2018-07-24 SURGERY — BREAST LUMPECTOMY WITH RADIOACTIVE SEED LOCALIZATION
Anesthesia: General | Site: Breast | Laterality: Right

## 2018-07-24 MED ORDER — OXYCODONE HCL 5 MG/5ML PO SOLN: 5.0000 mg | Freq: Once | ORAL | Status: DC | PRN

## 2018-07-24 MED ORDER — ACETAMINOPHEN 500 MG PO TABS
1000.0000 mg | ORAL_TABLET | ORAL | Status: AC
  Administered 2018-07-24: 1000 mg via ORAL

## 2018-07-24 MED ORDER — OXYCODONE HCL 5 MG PO TABS: 5.0000 mg | ORAL_TABLET | Freq: Once | ORAL | Status: DC | PRN

## 2018-07-24 MED ORDER — FENTANYL CITRATE (PF) 100 MCG/2ML IJ SOLN
INTRAMUSCULAR | Status: AC
  Filled 2018-07-24: qty 2

## 2018-07-24 MED ORDER — CEFAZOLIN SODIUM-DEXTROSE 2-4 GM/100ML-% IV SOLN
2.0000 g | INTRAVENOUS | Status: AC
  Administered 2018-07-24: 2 g via INTRAVENOUS

## 2018-07-24 MED ORDER — LIDOCAINE HCL (CARDIAC) PF 100 MG/5ML IV SOSY
PREFILLED_SYRINGE | INTRAVENOUS | Status: DC | PRN
  Administered 2018-07-24: 80 mg via INTRAVENOUS

## 2018-07-24 MED ORDER — TRAMADOL HCL 50 MG PO TABS: 50.0000 mg | ORAL_TABLET | Freq: Four times a day (QID) | ORAL | 0 refills | Status: DC | PRN

## 2018-07-24 MED ORDER — BUPIVACAINE-EPINEPHRINE 0.25% -1:200000 IJ SOLN
INTRAMUSCULAR | Status: DC | PRN
  Administered 2018-07-24: 9 mL

## 2018-07-24 MED ORDER — CHLORHEXIDINE GLUCONATE CLOTH 2 % EX PADS: 6.0000 | MEDICATED_PAD | Freq: Once | CUTANEOUS | Status: DC

## 2018-07-24 MED ORDER — PROPOFOL 10 MG/ML IV BOLUS
INTRAVENOUS | Status: DC | PRN
  Administered 2018-07-24: 200 mg via INTRAVENOUS
  Administered 2018-07-24: 50 mg via INTRAVENOUS

## 2018-07-24 MED ORDER — CELECOXIB 200 MG PO CAPS
200.0000 mg | ORAL_CAPSULE | ORAL | Status: AC
  Administered 2018-07-24: 200 mg via ORAL

## 2018-07-24 MED ORDER — CEFAZOLIN SODIUM-DEXTROSE 2-4 GM/100ML-% IV SOLN
INTRAVENOUS | Status: AC
  Filled 2018-07-24: qty 100

## 2018-07-24 MED ORDER — MIDAZOLAM HCL 2 MG/2ML IJ SOLN: 1.0000 mg | INTRAMUSCULAR | Status: DC | PRN

## 2018-07-24 MED ORDER — FENTANYL CITRATE (PF) 100 MCG/2ML IJ SOLN
INTRAMUSCULAR | Status: DC | PRN
  Administered 2018-07-24: 25 ug via INTRAVENOUS

## 2018-07-24 MED ORDER — MEPERIDINE HCL 25 MG/ML IJ SOLN: 6.2500 mg | INTRAMUSCULAR | Status: DC | PRN

## 2018-07-24 MED ORDER — FENTANYL CITRATE (PF) 100 MCG/2ML IJ SOLN: 50.0000 ug | INTRAMUSCULAR | Status: DC | PRN

## 2018-07-24 MED ORDER — CELECOXIB 200 MG PO CAPS
ORAL_CAPSULE | ORAL | Status: AC
  Filled 2018-07-24: qty 1

## 2018-07-24 MED ORDER — ONDANSETRON HCL 4 MG/2ML IJ SOLN
INTRAMUSCULAR | Status: AC
  Filled 2018-07-24: qty 2

## 2018-07-24 MED ORDER — SCOPOLAMINE 1 MG/3DAYS TD PT72: 1.0000 | MEDICATED_PATCH | Freq: Once | TRANSDERMAL | Status: DC | PRN

## 2018-07-24 MED ORDER — PROMETHAZINE HCL 25 MG/ML IJ SOLN: 6.2500 mg | INTRAMUSCULAR | Status: DC | PRN

## 2018-07-24 MED ORDER — HYDROMORPHONE HCL 1 MG/ML IJ SOLN: 0.2500 mg | INTRAMUSCULAR | Status: DC | PRN

## 2018-07-24 MED ORDER — DEXAMETHASONE SODIUM PHOSPHATE 4 MG/ML IJ SOLN
INTRAMUSCULAR | Status: DC | PRN
  Administered 2018-07-24: 10 mg via INTRAVENOUS

## 2018-07-24 MED ORDER — ONDANSETRON HCL 4 MG/2ML IJ SOLN
INTRAMUSCULAR | Status: DC | PRN
  Administered 2018-07-24: 4 mg via INTRAVENOUS

## 2018-07-24 MED ORDER — MIDAZOLAM HCL 2 MG/2ML IJ SOLN
INTRAMUSCULAR | Status: AC
  Filled 2018-07-24: qty 2

## 2018-07-24 MED ORDER — LACTATED RINGERS IV SOLN
INTRAVENOUS | Status: DC
  Administered 2018-07-24: 11:00:00 via INTRAVENOUS

## 2018-07-24 MED ORDER — MIDAZOLAM HCL 5 MG/5ML IJ SOLN
INTRAMUSCULAR | Status: DC | PRN
  Administered 2018-07-24: 2 mg via INTRAVENOUS

## 2018-07-24 MED ORDER — DEXAMETHASONE SODIUM PHOSPHATE 10 MG/ML IJ SOLN
INTRAMUSCULAR | Status: AC
  Filled 2018-07-24: qty 1

## 2018-07-24 MED ORDER — GABAPENTIN 300 MG PO CAPS
ORAL_CAPSULE | ORAL | Status: AC
  Filled 2018-07-24: qty 1

## 2018-07-24 MED ORDER — GABAPENTIN 300 MG PO CAPS
300.0000 mg | ORAL_CAPSULE | ORAL | Status: AC
  Administered 2018-07-24: 300 mg via ORAL

## 2018-07-24 MED ORDER — LIDOCAINE 2% (20 MG/ML) 5 ML SYRINGE
INTRAMUSCULAR | Status: AC
  Filled 2018-07-24: qty 5

## 2018-07-24 MED ORDER — ACETAMINOPHEN 500 MG PO TABS
ORAL_TABLET | ORAL | Status: AC
  Filled 2018-07-24: qty 2

## 2018-07-24 SURGICAL SUPPLY — 51 items
ADH SKN CLS APL DERMABOND .7 (GAUZE/BANDAGES/DRESSINGS) ×1
APPLIER CLIP 9.375 MED OPEN (MISCELLANEOUS)
APR CLP MED 9.3 20 MLT OPN (MISCELLANEOUS)
BINDER BREAST LRG (GAUZE/BANDAGES/DRESSINGS) ×1 IMPLANT
BLADE HEX COATED 2.75 (ELECTRODE) ×2 IMPLANT
BLADE SURG 15 STRL LF DISP TIS (BLADE) ×1 IMPLANT
BLADE SURG 15 STRL SS (BLADE) ×2
CANISTER SUC SOCK COL 7IN (MISCELLANEOUS) IMPLANT
CANISTER SUCT 1200ML W/VALVE (MISCELLANEOUS) IMPLANT
CHLORAPREP W/TINT 26ML (MISCELLANEOUS) ×2 IMPLANT
CLIP APPLIE 9.375 MED OPEN (MISCELLANEOUS) IMPLANT
CLIP VESOCCLUDE SM WIDE 6/CT (CLIP) ×1 IMPLANT
COVER BACK TABLE 60X90IN (DRAPES) ×2 IMPLANT
COVER MAYO STAND STRL (DRAPES) ×2 IMPLANT
COVER PROBE W GEL 5X96 (DRAPES) ×2 IMPLANT
COVER WAND RF STERILE (DRAPES) IMPLANT
DECANTER SPIKE VIAL GLASS SM (MISCELLANEOUS) IMPLANT
DERMABOND ADVANCED (GAUZE/BANDAGES/DRESSINGS) ×1
DERMABOND ADVANCED .7 DNX12 (GAUZE/BANDAGES/DRESSINGS) ×1 IMPLANT
DEVICE DUBIN W/COMP PLATE 8390 (MISCELLANEOUS) ×2 IMPLANT
DRAPE LAPAROSCOPIC ABDOMINAL (DRAPES) ×2 IMPLANT
DRAPE UTILITY XL STRL (DRAPES) ×2 IMPLANT
ELECT REM PT RETURN 9FT ADLT (ELECTROSURGICAL) ×2
ELECTRODE REM PT RTRN 9FT ADLT (ELECTROSURGICAL) ×1 IMPLANT
GAUZE SPONGE 4X4 12PLY STRL LF (GAUZE/BANDAGES/DRESSINGS) IMPLANT
GLOVE BIO SURGEON STRL SZ7 (GLOVE) ×1 IMPLANT
GLOVE BIOGEL PI IND STRL 7.5 (GLOVE) IMPLANT
GLOVE BIOGEL PI INDICATOR 7.5 (GLOVE) ×1
GLOVE EXAM NITRILE MD LF STRL (GLOVE) ×1 IMPLANT
GLOVE SURG SIGNA 7.5 PF LTX (GLOVE) ×2 IMPLANT
GOWN STRL REUS W/ TWL LRG LVL3 (GOWN DISPOSABLE) ×1 IMPLANT
GOWN STRL REUS W/ TWL XL LVL3 (GOWN DISPOSABLE) ×1 IMPLANT
GOWN STRL REUS W/TWL LRG LVL3 (GOWN DISPOSABLE) ×2
GOWN STRL REUS W/TWL XL LVL3 (GOWN DISPOSABLE) ×2
KIT MARKER MARGIN INK (KITS) ×2 IMPLANT
NDL HYPO 25X1 1.5 SAFETY (NEEDLE) ×1 IMPLANT
NEEDLE HYPO 25X1 1.5 SAFETY (NEEDLE) ×2 IMPLANT
NS IRRIG 1000ML POUR BTL (IV SOLUTION) IMPLANT
PACK BASIN DAY SURGERY FS (CUSTOM PROCEDURE TRAY) ×2 IMPLANT
PENCIL BUTTON HOLSTER BLD 10FT (ELECTRODE) ×2 IMPLANT
SLEEVE SCD COMPRESS KNEE MED (MISCELLANEOUS) ×2 IMPLANT
SPONGE LAP 4X18 RFD (DISPOSABLE) ×2 IMPLANT
SUT MNCRL AB 4-0 PS2 18 (SUTURE) ×2 IMPLANT
SUT SILK 2 0 SH (SUTURE) IMPLANT
SUT VIC AB 3-0 SH 27 (SUTURE) ×2
SUT VIC AB 3-0 SH 27X BRD (SUTURE) ×1 IMPLANT
SYR CONTROL 10ML LL (SYRINGE) ×2 IMPLANT
TOWEL GREEN STERILE FF (TOWEL DISPOSABLE) ×2 IMPLANT
TOWEL OR NON WOVEN STRL DISP B (DISPOSABLE) IMPLANT
TUBE CONNECTING 20X1/4 (TUBING) IMPLANT
YANKAUER SUCT BULB TIP NO VENT (SUCTIONS) IMPLANT

## 2018-07-24 NOTE — Transfer of Care (Signed)
Immediate Anesthesia Transfer of Care Note  Patient: Stephanie Galvan  Procedure(s) Performed: RIGHT BREAST LUMPECTOMY WITH RADIOACTIVE SEED LOCALIZATION (Right Breast)  Patient Location: PACU  Anesthesia Type:General  Level of Consciousness: awake and sedated  Airway & Oxygen Therapy: Patient Spontanous Breathing and Patient connected to face mask oxygen  Post-op Assessment: Report given to RN and Post -op Vital signs reviewed and stable  Post vital signs: Reviewed and stable  Last Vitals:  Vitals Value Taken Time  BP 90/58 07/24/2018  1:11 PM  Temp    Pulse 58 07/24/2018  1:12 PM  Resp 11 07/24/2018  1:12 PM  SpO2 100 % 07/24/2018  1:12 PM  Vitals shown include unvalidated device data.  Last Pain:  Vitals:   07/24/18 1046  TempSrc: Oral  PainSc: 0-No pain         Complications: No apparent anesthesia complications

## 2018-07-24 NOTE — Op Note (Signed)
RIGHT BREAST LUMPECTOMY WITH RADIOACTIVE SEED LOCALIZATION  Procedure Note  [name] 07/24/2018   Pre-op Diagnosis: RIGHT BREAST COMPLEX SCLEROSING LESION     Post-op Diagnosis: same  Procedure(s): RIGHT BREAST LUMPECTOMY WITH RADIOACTIVE SEED LOCALIZATION  Surgeon(s): Coralie Keens, MD  Anesthesia: General  Staff:  Circulator: Ted Mcalpine, RN Scrub Person: Gregery Na, CST; Lorenza Burton, CST  Estimated Blood Loss: Minimal               Specimens: sent to path  Procedure: The patient was brought to the operating room and identified as correct patient.  She was placed supine on the operating room table and general anesthesia was induced.  Her right breast was then prepped and draped in usual sterile fashion.  Using the neoprobe, the radioactive seed was located at the 12 o'clock position of the right breast.  I anesthetized the skin at the upper edge of the areola with Marcaine and made an incision with a scalpel.  I then dissected superiorly with the aid of the neoprobe toward the radioactive seed.  I then identified the area of the seed and performed a lumpectomy staying widely around it with the aid of the neoprobe.  Once the lumpectomy specimen was completely excised, I marked the margins with paint.  An x-ray was performed on the specimen and confirmed that the radioactive seed and previous marker were in the specimen.  The specimen was then sent to pathology for evaluation.  I achieved hemostasis with the cautery.  I placed a surgical clip into the biopsy cavity for marker purposes.  I then closed the subcutaneous tissue with interrupted 3-0 Vicryl sutures and closed the skin with a running 4-0 Monocryl.  Dermabond and a binder were then applied.  The patient tolerated the procedure well.  All the counts were correct at the end of the procedure.  The patient was then extubated in the operating room and taken in a stable condition to the recovery room.        [name]   Date: 07/24/2018  Time: 1:07 PM

## 2018-07-24 NOTE — Anesthesia Preprocedure Evaluation (Signed)
Anesthesia Evaluation  Patient identified by MRN, date of birth, ID band Patient awake    Reviewed: Allergy & Precautions, NPO status , Patient's Chart, lab work & pertinent test results, reviewed documented beta blocker date and time   Airway Mallampati: II  TM Distance: >3 FB Neck ROM: Full    Dental no notable dental hx.    Pulmonary neg pulmonary ROS,    Pulmonary exam normal breath sounds clear to auscultation       Cardiovascular hypertension, Pt. on medications and Pt. on home beta blockers negative cardio ROS Normal cardiovascular exam Rhythm:Regular Rate:Normal     Neuro/Psych negative neurological ROS  negative psych ROS   GI/Hepatic negative GI ROS, Neg liver ROS,   Endo/Other  negative endocrine ROS  Renal/GU negative Renal ROS  negative genitourinary   Musculoskeletal negative musculoskeletal ROS (+)   Abdominal   Peds negative pediatric ROS (+)  Hematology negative hematology ROS (+)   Anesthesia Other Findings   Reproductive/Obstetrics negative OB ROS                             Anesthesia Physical Anesthesia Plan  ASA: II  Anesthesia Plan: General   Post-op Pain Management:    Induction: Intravenous  PONV Risk Score and Plan: 3 and Ondansetron, Dexamethasone and Midazolam  Airway Management Planned: LMA  Additional Equipment:   Intra-op Plan:   Post-operative Plan: Extubation in OR  Informed Consent: I have reviewed the patients History and Physical, chart, labs and discussed the procedure including the risks, benefits and alternatives for the proposed anesthesia with the patient or authorized representative who has indicated his/her understanding and acceptance.   Dental advisory given  Plan Discussed with: CRNA  Anesthesia Plan Comments:         Anesthesia Quick Evaluation

## 2018-07-24 NOTE — Anesthesia Procedure Notes (Signed)
Procedure Name: LMA Insertion Date/Time: 07/24/2018 12:38 PM Performed by: Raenette Rover, CRNA Pre-anesthesia Checklist: Patient identified, Emergency Drugs available, Suction available and Patient being monitored Patient Re-evaluated:Patient Re-evaluated prior to induction Oxygen Delivery Method: Circle system utilized Preoxygenation: Pre-oxygenation with 100% oxygen Induction Type: IV induction LMA: LMA inserted LMA Size: 4.0 Number of attempts: 1 Placement Confirmation: positive ETCO2,  CO2 detector and breath sounds checked- equal and bilateral Tube secured with: Tape Dental Injury: Teeth and Oropharynx as per pre-operative assessment

## 2018-07-24 NOTE — Discharge Instructions (Signed)
[facility] Office Phone Number [phone]  BREAST BIOPSY/ PARTIAL MASTECTOMY: POST OP INSTRUCTIONS  Always review your discharge instruction sheet given to you by the facility where your surgery was performed.  IF YOU HAVE DISABILITY OR FAMILY LEAVE FORMS, YOU MUST BRING THEM TO THE OFFICE FOR PROCESSING.  DO NOT GIVE THEM TO YOUR DOCTOR.  1. A prescription for pain medication may be given to you upon discharge.  Take your pain medication as prescribed, if needed.  If narcotic pain medicine is not needed, then you may take acetaminophen (Tylenol) or ibuprofen (Advil) as needed. 2. Take your usually prescribed medications unless otherwise directed 3. If you need a refill on your pain medication, please contact your pharmacy.  They will contact our office to request authorization.  Prescriptions will not be filled after 5pm or on week-ends. 4. You should eat very light the first 24 hours after surgery, such as soup, crackers, pudding, etc.  Resume your normal diet the day after surgery. 5. Most patients will experience some swelling and bruising in the breast.  Ice packs and a good support bra will help.  Swelling and bruising can take several days to resolve.  6. It is common to experience some constipation if taking pain medication after surgery.  Increasing fluid intake and taking a stool softener will usually help or prevent this problem from occurring.  A mild laxative (Milk of Magnesia or Miralax) should be taken according to package directions if there are no bowel movements after 48 hours. 7. Unless discharge instructions indicate otherwise, you may remove your bandages 24-48 hours after surgery, and you may shower at that time.  You may have steri-strips (small skin tapes) in place directly over the incision.  These strips should be left on the skin for 7-10 days.  If your surgeon used skin glue on the incision, you may shower in 24 hours.  The glue will flake off over the  next 2-3 weeks.  Any sutures or staples will be removed at the office during your follow-up visit. 8. ACTIVITIES:  You may resume regular daily activities (gradually increasing) beginning the next day.  Wearing a good support bra or sports bra minimizes pain and swelling.  You may have sexual intercourse when it is comfortable. a. You may drive when you no longer are taking prescription pain medication, you can comfortably wear a seatbelt, and you can safely maneuver your car and apply brakes. b. RETURN TO WORK:  ______________________________________________________________________________________ 9. You should see your doctor in the office for a follow-up appointment approximately two weeks after your surgery.  Your doctors nurse will typically make your follow-up appointment when she calls you with your pathology report.  Expect your pathology report 2-3 business days after your surgery.  You may call to check if you do not hear from [place] after three days. 10. OTHER INSTRUCTIONS:OK TO SHOWER STARTING TOMORROW 11. ICE PACK, TYLENOL, IBUPROFEN ALSO FOR PAIN 12. WEAR BINDER FOR COMFORT IF NEEDED _______________________________________________________________________________________________ _____________________________________________________________________________________________________________________________________ _____________________________________________________________________________________________________________________________________ _____________________________________________________________________________________________________________________________________  WHEN TO CALL YOUR DOCTOR: 1. Fever over 101.0 2. Nausea and/or vomiting. 3. Extreme swelling or bruising. 4. Continued bleeding from incision. 5. Increased pain, redness, or drainage from the incision.  The clinic staff is available to answer your questions during regular business hours.  Please dont hesitate to call  and ask to speak to one of the nurses for clinical concerns.  If you have a medical emergency, go to the nearest emergency room or call 911.  A surgeon  from [facility] is always on call at the hospital.  For further questions, please visit [email or web address]      Post Anesthesia Home Care Instructions  Activity: Get plenty of rest for the remainder of the day. A responsible individual must stay with you for 24 hours following the procedure.  For the next 24 hours, DO NOT: -Drive a car -Paediatric nurse -Drink alcoholic beverages -Take any medication unless instructed by your physician -Make any legal decisions or sign important papers.  Meals: Start with liquid foods such as gelatin or soup. Progress to regular foods as tolerated. Avoid greasy, spicy, heavy foods. If nausea and/or vomiting occur, drink only clear liquids until the nausea and/or vomiting subsides. Call your physician if vomiting continues.  Special Instructions/Symptoms: Your throat may feel dry or sore from the anesthesia or the breathing tube placed in your throat during surgery. If this causes discomfort, gargle with warm salt water. The discomfort should disappear within 24 hours.  If you had a scopolamine patch placed behind your ear for the management of post- operative nausea and/or vomiting:  1. The medication in the patch is effective for 72 hours, after which it should be removed.  Wrap patch in a tissue and discard in the trash. Wash hands thoroughly with soap and water. 2. You may remove the patch earlier than 72 hours if you experience unpleasant side effects which may include dry mouth, dizziness or visual disturbances. 3. Avoid touching the patch. Wash your hands with soap and water after contact with the patch.

## 2018-07-24 NOTE — Interval H&P Note (Signed)
History and Physical Interval Note: no change in H and P  07/24/2018 11:37 AM  Stephanie Galvan  has presented today for surgery, with the diagnosis of RIGHT BREAST COMPLEX SCLEROSING LESION  The various methods of treatment have been discussed with the patient and family. After consideration of risks, benefits and other options for treatment, the patient has consented to  Procedure(s): BREAST LUMPECTOMY WITH RADIOACTIVE SEED LOCALIZATION (Right) as a surgical intervention .  The patient's history has been reviewed, patient examined, no change in status, stable for surgery.  I have reviewed the patient's chart and labs.  Questions were answered to the patient's satisfaction.     Kaison Mcparland A

## 2018-07-24 NOTE — Anesthesia Postprocedure Evaluation (Signed)
Anesthesia Post Note  Patient: Stephanie Galvan  Procedure(s) Performed: RIGHT BREAST LUMPECTOMY WITH RADIOACTIVE SEED LOCALIZATION (Right Breast)     Patient location during evaluation: PACU Anesthesia Type: General Level of consciousness: awake and alert Pain management: pain level controlled Vital Signs Assessment: post-procedure vital signs reviewed and stable Respiratory status: spontaneous breathing, nonlabored ventilation and respiratory function stable Cardiovascular status: blood pressure returned to baseline and stable Postop Assessment: no apparent nausea or vomiting Anesthetic complications: no    Last Vitals:  Vitals:   07/24/18 1330 07/24/18 1415  BP: 121/64 117/78  Pulse: (!) 57 68  Resp: 13 18  Temp:  36.7 C  SpO2: 100% 100%    Last Pain:  Vitals:   07/24/18 1415  TempSrc:   PainSc: 2                  Lynda Rainwater

## 2018-07-25 ENCOUNTER — Encounter (HOSPITAL_BASED_OUTPATIENT_CLINIC_OR_DEPARTMENT_OTHER): Payer: Self-pay | Admitting: Surgery

## 2018-08-15 ENCOUNTER — Other Ambulatory Visit: Payer: Self-pay | Admitting: Surgery

## 2019-06-01 ENCOUNTER — Encounter (HOSPITAL_COMMUNITY): Payer: Self-pay | Admitting: Surgery

## 2019-06-02 ENCOUNTER — Inpatient Hospital Stay (HOSPITAL_COMMUNITY): Payer: BC Managed Care – PPO | Attending: Hematology | Admitting: Hematology

## 2019-06-02 ENCOUNTER — Inpatient Hospital Stay (HOSPITAL_COMMUNITY): Payer: BC Managed Care – PPO

## 2019-06-02 ENCOUNTER — Encounter (HOSPITAL_COMMUNITY): Payer: Self-pay | Admitting: Hematology

## 2019-06-02 ENCOUNTER — Ambulatory Visit (HOSPITAL_COMMUNITY)
Admission: RE | Admit: 2019-06-02 | Discharge: 2019-06-02 | Disposition: A | Discharge status: Home / Self Care | Payer: BC Managed Care – PPO | Source: Ambulatory Visit | Attending: Hematology | Admitting: Hematology

## 2019-06-02 ENCOUNTER — Other Ambulatory Visit: Payer: Self-pay

## 2019-06-02 VITALS — BP 151/69 | HR 79 | Temp 98.4°F | Resp 18 | Wt 178.0 lb

## 2019-06-02 DIAGNOSIS — D892 Hypergammaglobulinemia, unspecified: Secondary | ICD-10-CM | POA: Diagnosis not present

## 2019-06-02 DIAGNOSIS — Z853 Personal history of malignant neoplasm of breast: Secondary | ICD-10-CM | POA: Diagnosis not present

## 2019-06-02 DIAGNOSIS — Z833 Family history of diabetes mellitus: Secondary | ICD-10-CM | POA: Diagnosis not present

## 2019-06-02 DIAGNOSIS — M62838 Other muscle spasm: Secondary | ICD-10-CM | POA: Diagnosis not present

## 2019-06-02 DIAGNOSIS — D472 Monoclonal gammopathy: Secondary | ICD-10-CM | POA: Diagnosis not present

## 2019-06-02 DIAGNOSIS — G4700 Insomnia, unspecified: Secondary | ICD-10-CM | POA: Insufficient documentation

## 2019-06-02 DIAGNOSIS — Z9012 Acquired absence of left breast and nipple: Secondary | ICD-10-CM | POA: Diagnosis not present

## 2019-06-02 DIAGNOSIS — Z809 Family history of malignant neoplasm, unspecified: Secondary | ICD-10-CM

## 2019-06-02 DIAGNOSIS — L859 Epidermal thickening, unspecified: Secondary | ICD-10-CM | POA: Insufficient documentation

## 2019-06-02 DIAGNOSIS — Z87440 Personal history of urinary (tract) infections: Secondary | ICD-10-CM | POA: Diagnosis not present

## 2019-06-02 DIAGNOSIS — Z8249 Family history of ischemic heart disease and other diseases of the circulatory system: Secondary | ICD-10-CM | POA: Insufficient documentation

## 2019-06-02 DIAGNOSIS — M549 Dorsalgia, unspecified: Secondary | ICD-10-CM | POA: Insufficient documentation

## 2019-06-02 DIAGNOSIS — Z8379 Family history of other diseases of the digestive system: Secondary | ICD-10-CM | POA: Insufficient documentation

## 2019-06-02 LAB — COMPREHENSIVE METABOLIC PANEL
ALT: 36 U/L (ref 0–44)
AST: 30 U/L (ref 15–41)
Albumin: 3.4 g/dL — ABNORMAL LOW (ref 3.5–5.0)
Alkaline Phosphatase: 120 U/L (ref 38–126)
Anion gap: 11 (ref 5–15)
BUN: 14 mg/dL (ref 8–23)
CO2: 27 mmol/L (ref 22–32)
Calcium: 9.9 mg/dL (ref 8.9–10.3)
Chloride: 100 mmol/L (ref 98–111)
Creatinine, Ser: 0.78 mg/dL (ref 0.44–1.00)
GFR calc Af Amer: >60 mL/min (ref >60)
GFR calc non Af Amer: >60 mL/min (ref >60)
Glucose, Bld: 103 mg/dL — ABNORMAL HIGH (ref 70–99)
Potassium: 3.6 mmol/L (ref 3.5–5.1)
Sodium: 138 mmol/L (ref 135–145)
Total Bilirubin: 0.6 mg/dL (ref 0.3–1.2)
Total Protein: 9.8 g/dL — ABNORMAL HIGH (ref 6.5–8.1)

## 2019-06-02 LAB — CBC WITH DIFFERENTIAL/PLATELET
Abs Immature Granulocytes: 0.01 10*3/uL (ref 0.00–0.07)
Basophils Absolute: 0 10*3/uL (ref 0.0–0.1)
Basophils Relative: 0 %
Eosinophils Absolute: 0.2 10*3/uL (ref 0.0–0.5)
Eosinophils Relative: 5 %
HCT: 36.6 % (ref 36.0–46.0)
Hemoglobin: 11.4 g/dL — ABNORMAL LOW (ref 12.0–15.0)
Immature Granulocytes: 0 %
Lymphocytes Relative: 38 %
Lymphs Abs: 1.9 10*3/uL (ref 0.7–4.0)
MCH: 29.6 pg (ref 26.0–34.0)
MCHC: 31.1 g/dL (ref 30.0–36.0)
MCV: 95.1 fL (ref 80.0–100.0)
Monocytes Absolute: 0.4 10*3/uL (ref 0.1–1.0)
Monocytes Relative: 9 %
Neutro Abs: 2.4 10*3/uL (ref 1.7–7.7)
Neutrophils Relative %: 48 %
Platelets: 283 10*3/uL (ref 150–400)
RBC: 3.85 MIL/uL — ABNORMAL LOW (ref 3.87–5.11)
RDW: 13 % (ref 11.5–15.5)
WBC: 5 10*3/uL (ref 4.0–10.5)
nRBC: 0 % (ref 0.0–0.2)

## 2019-06-02 LAB — URIC ACID: Uric Acid, Serum: 5.8 mg/dL (ref 2.5–7.1)

## 2019-06-02 LAB — LACTATE DEHYDROGENASE: LDH: 132 U/L (ref 98–192)

## 2019-06-02 NOTE — Progress Notes (Signed)
CONSULT NOTE  Patient Care Team: [name], MD as PCP - General (Family Medicine)  CHIEF COMPLAINTS/PURPOSE OF CONSULTATION:  IgA monoclonal gammopathy.  HISTORY OF PRESENTING ILLNESS:  Stephanie Galvan 63 y.o. female is seen in consultation today at the request of Dr. Karie Kirks for abnormal blood work done on 04/27/2019.  Serum immunofixation showed IgA lambda monoclonal protein.  SPEP showed 3 abnormal bands, measuring 0.7 g, 2.4 g and 0.5 g.  She reported back pain which started in July in the mid back region.  She is having some muscle spasms on certain movements.  For the last 1 month, pains have gotten worse.  She is taking BC powder once per day and ibuprofen twice daily.  Denies any tingling or numbness in extremities.  Denies any headaches or vision changes.  Denies any fevers, night sweats or weight loss.  Denies any recurrent infections.  No prior history of blood transfusion.  She had colonoscopy several years ago which was reportedly normal.  She lives at home with her husband.  She works at [employer] in [employer].  She is a never smoker.  Family history consistent with brother with colon cancer.  Another brother had pancreatic cancer and father had stomach cancer.  No family history of multiple myeloma.  She had right breast lumpectomy in November 2019 which showed fibrocystic changes with usual to moderate duct epithelial hyperplasia.  She reports chronic constipation which is stable.  Otherwise appetite and energy levels are reported as 100%.  Pain is reported as 5 out of 10 in the mid back region.  She has difficulty falling asleep and staying asleep due to the back pain.    MEDICAL HISTORY:  Past Medical History:  Diagnosis Date  . Breast cancer ([place]) 2006   left breast  . Breast mass, right   . High cholesterol   . History of bladder infections   . Hypertension   . Rash Aug 2013    SURGICAL HISTORY: Past Surgical History:  Procedure Laterality Date  .  ABDOMINAL HYSTERECTOMY     just uterus removed  . BREAST BIOPSY    . BREAST LUMPECTOMY WITH RADIOACTIVE SEED LOCALIZATION Right 07/24/2018   Procedure: RIGHT BREAST LUMPECTOMY WITH RADIOACTIVE SEED LOCALIZATION;  Surgeon: Coralie Keens, MD;  Location: [facility];  Service: General;  Laterality: Right;  . MASTECTOMY  5/06   left  . PARTIAL HYSTERECTOMY    . PORT-A-CATH REMOVAL    . PORTACATH PLACEMENT      SOCIAL HISTORY: Social History   Socioeconomic History  . Marital status: Married    Spouse name: Not on file  . Number of children: 2  . Years of education: Not on file  . Highest education level: Not on file  Occupational History    Employer: [employer]  . Financial resource strain: Not on file  . Food insecurity    Worry: Not on file    Inability: Not on file  . Transportation needs    Medical: Not on file    Non-medical: Not on file  Tobacco Use  . Smoking status: Never Smoker  . Smokeless tobacco: Never Used  Substance and Sexual Activity  . Alcohol use: No  . Drug use: No  . Sexual activity: Yes    Birth control/protection: Surgical  Lifestyle  . Physical activity    Days per week: Not on file    Minutes per session: Not on file  . Stress: Not  on file  Relationships  . Social Herbalist on phone: Not on file    Gets together: Not on file    Attends religious service: Not on file    Active member of club or organization: Not on file    Attends meetings of clubs or organizations: Not on file    Relationship status: Not on file  . Intimate partner violence    Fear of current or ex partner: Not on file    Emotionally abused: Not on file    Physically abused: Not on file    Forced sexual activity: Not on file  Other Topics Concern  . Not on file  Social History Narrative  . Not on file    FAMILY HISTORY: Family History  Problem Relation Age of Onset  . Dementia Mother   . Stomach cancer Father   .  Cancer Brother   . Cancer Brother   . Diabetes Brother   . Colitis Daughter   . Hypertension Daughter   . Hypertension Daughter     ALLERGIES:  has No Known Allergies.  MEDICATIONS:  Current Outpatient Medications  Medication Sig Dispense Refill  . atenolol (TENORMIN) 50 MG tablet Take 50 mg by mouth daily.      . hydrochlorothiazide 25 MG tablet Take 25 mg by mouth daily.      . potassium chloride (K-DUR) 10 MEQ tablet Take 10 mEq by mouth daily.    . Aspirin-Salicylamide-Caffeine (BC HEADACHE POWDER PO) Take 1 tablet by mouth as needed.     . potassium chloride SA (K-DUR,KLOR-CON) 20 MEQ tablet Take 1 tablet (20 mEq total) by mouth daily. 90 tablet 3   No current facility-administered medications for this visit.     REVIEW OF SYSTEMS:   Constitutional: Denies fevers, chills or abnormal night sweats Eyes: Denies blurriness of vision, double vision or watery eyes Ears, nose, mouth, throat, and face: Denies mucositis or sore throat Respiratory: Denies cough, dyspnea or wheezes Cardiovascular: Denies palpitation, chest discomfort or lower extremity swelling Gastrointestinal:  Denies nausea, heartburn or change in bowel habits.  Positive for constipation. Skin: Denies abnormal skin rashes Lymphatics: Denies new lymphadenopathy or easy bruising Neurological:Denies numbness, tingling or new weaknesses Behavioral/Psych: Mood is stable, no new changes  All other systems were reviewed with the patient and are negative.  PHYSICAL EXAMINATION: ECOG PERFORMANCE STATUS: 0 - Asymptomatic  Vitals:   06/02/19 1413  BP: (!) 151/69  Pulse: 79  Resp: 18  Temp: 98.4 F (36.9 C)  SpO2: 99%   Filed Weights   06/02/19 1413  Weight: 178 lb (80.7 kg)    GENERAL:alert, no distress and comfortable SKIN: skin color, texture, turgor are normal, no rashes or significant lesions EYES: normal, conjunctiva are pink and non-injected, sclera clear OROPHARYNX:no exudate, no erythema and lips,  buccal mucosa, and tongue normal  NECK: supple, thyroid normal size, non-tender, without nodularity LYMPH:  no palpable lymphadenopathy in the cervical, axillary or inguinal LUNGS: clear to auscultation and percussion with normal breathing effort HEART: regular rate & rhythm and no murmurs and no lower extremity edema ABDOMEN:abdomen soft, non-tender and normal bowel sounds Musculoskeletal:no cyanosis of digits and no clubbing  PSYCH: alert & oriented x 3 with fluent speech NEURO: no focal motor/sensory deficits  LABORATORY DATA:  I have reviewed the data as listed Recent Results (from the past 2160 hour(s))  CBC with Differential/Platelet     Status: Abnormal   Collection Time: 06/02/19  3:06 PM  Result  Value Ref Range   WBC 5.0 4.0 - 10.5 K/uL   RBC 3.85 (L) 3.87 - 5.11 MIL/uL   Hemoglobin 11.4 (L) 12.0 - 15.0 g/dL   HCT 36.6 36.0 - 46.0 %   MCV 95.1 80.0 - 100.0 fL   MCH 29.6 26.0 - 34.0 pg   MCHC 31.1 30.0 - 36.0 g/dL   RDW 13.0 11.5 - 15.5 %   Platelets 283 150 - 400 K/uL   nRBC 0.0 0.0 - 0.2 %   Neutrophils Relative % 48 %   Neutro Abs 2.4 1.7 - 7.7 K/uL   Lymphocytes Relative 38 %   Lymphs Abs 1.9 0.7 - 4.0 K/uL   Monocytes Relative 9 %   Monocytes Absolute 0.4 0.1 - 1.0 K/uL   Eosinophils Relative 5 %   Eosinophils Absolute 0.2 0.0 - 0.5 K/uL   Basophils Relative 0 %   Basophils Absolute 0.0 0.0 - 0.1 K/uL   Immature Granulocytes 0 %   Abs Immature Granulocytes 0.01 0.00 - 0.07 K/uL    Comment: Performed at [facility], [address] [place], [place] [address]  Comprehensive metabolic panel     Status: Abnormal   Collection Time: 06/02/19  3:06 PM  Result Value Ref Range   Sodium 138 135 - 145 mmol/L   Potassium 3.6 3.5 - 5.1 mmol/L   Chloride 100 98 - 111 mmol/L   CO2 27 22 - 32 mmol/L   Glucose, Bld 103 (H) 70 - 99 mg/dL   BUN 14 8 - 23 mg/dL   Creatinine, Ser 0.78 0.44 - 1.00 mg/dL   Calcium 9.9 8.9 - 10.3 mg/dL   Total Protein 9.8 (H) 6.5 - 8.1 g/dL    Albumin 3.4 (L) 3.5 - 5.0 g/dL   AST 30 15 - 41 U/L   ALT 36 0 - 44 U/L   Alkaline Phosphatase 120 38 - 126 U/L   Total Bilirubin 0.6 0.3 - 1.2 mg/dL   GFR calc non Af Amer >60 >60 mL/min   GFR calc Af Amer >60 >60 mL/min   Anion gap 11 5 - 15    Comment: Performed at [facility], [address], [place], [place] [address]  Lactate dehydrogenase     Status: None   Collection Time: 06/02/19  3:06 PM  Result Value Ref Range   LDH 132 98 - 192 U/L    Comment: Performed at [facility], [address] [place], [place] [address]  Uric acid     Status: None   Collection Time: 06/02/19  3:06 PM  Result Value Ref Range   Uric Acid, Serum 5.8 2.5 - 7.1 mg/dL    Comment: Performed at [facility], [address], [place], [place] [address]    RADIOGRAPHIC STUDIES: I have personally reviewed the radiological images as listed and agreed with the findings in the report.  ASSESSMENT & PLAN:  IgA monoclonal gammopathy 1.  IgA lambda monoclonal gammopathy: - Patient developed mid back pain since July, while lifting weight and thought she sprained muscle. -She reports muscle spasms on movements in the paraspinal region.  She has been taking BC powder once daily and ibuprofen twice daily for the last 1 month. -She was seen by Dr. Karie Kirks who did SPEP on 04/27/2019 showed 3 abnormal bands of 0.7 g, 2.4 g and 0.5 g each.  Immunofixation showed IgA lambda monoclonal protein. -He denies any fevers, night sweats or weight loss.  Denies any tingling or numbness next 20s.  No  headaches or vision changes.  No recurrent infections.  Denies any history of transfusions. - She needs further work-up to rule out multiple myeloma. -I have recommended checking CBC, CMP, LDH, beta-2 microglobulin, uric acid, free light chain ratio.  I would also repeat a SPEP as she had 3 abnormal bands on the first test.  We will send 24-hour urine for UPEP and urine immunofixation.  I have also recommended metastatic skeletal survey  to rule out any lytic lesions. - She will likely need a bone marrow aspiration and biopsy.  She is very concerned about coverage of these tests by her insurance. -We will see her back after the tests are resulted.  2.  Family history: -No family history of multiple myeloma.  Brother had colon cancer.  Another brother had pancreatic cancer.  Father had stomach cancer.     All questions were answered. The patient knows to call the clinic with any problems, questions or concerns.     Derek Jack, MD 06/02/19 5:32 PM

## 2019-06-02 NOTE — Assessment & Plan Note (Signed)
1.  IgA lambda monoclonal gammopathy: - Patient developed mid back pain since July, while lifting weight and thought she sprained muscle. -She reports muscle spasms on movements in the paraspinal region.  She has been taking BC powder once daily and ibuprofen twice daily for the last 1 month. -She was seen by Dr. Karie Kirks who did SPEP on 04/27/2019 showed 3 abnormal bands of 0.7 g, 2.4 g and 0.5 g each.  Immunofixation showed IgA lambda monoclonal protein. -He denies any fevers, night sweats or weight loss.  Denies any tingling or numbness next 20s.  No headaches or vision changes.  No recurrent infections.  Denies any history of transfusions. - She needs further work-up to rule out multiple myeloma. -I have recommended checking CBC, CMP, LDH, beta-2 microglobulin, uric acid, free light chain ratio.  I would also repeat a SPEP as she had 3 abnormal bands on the first test.  We will send 24-hour urine for UPEP and urine immunofixation.  I have also recommended metastatic skeletal survey to rule out any lytic lesions. - She will likely need a bone marrow aspiration and biopsy.  She is very concerned about coverage of these tests by her insurance. -We will see her back after the tests are resulted.  2.  Family history: -No family history of multiple myeloma.  Brother had colon cancer.  Another brother had pancreatic cancer.  Father had stomach cancer.

## 2019-06-02 NOTE — Patient Instructions (Addendum)
[place] at [facility] Discharge Instructions  You were seen today by Dr. Delton Coombes. He went over your history, family history and how you've been feeling lately. He will get blood drawn today. He will also schedule you for a bone survey. He will see you back in 1 week for follow up.   Thank you for choosing [place] at [facility] to provide your oncology and hematology care.  To afford each patient quality time with our provider, please arrive at least 15 minutes before your scheduled appointment time.   If you have a lab appointment with the [facility] please come in thru the  Main Entrance and check in at the main information desk  You need to re-schedule your appointment should you arrive 10 or more minutes late.  We strive to give you quality time with our providers, and arriving late affects you and other patients whose appointments are after yours.  Also, if you no show three or more times for appointments you may be dismissed from the clinic at the providers discretion.     Again, thank you for choosing [facility].  Our hope is that these requests will decrease the amount of time that you wait before being seen by our physicians.       _____________________________________________________________  Should you have questions after your visit to [facility], please contact our office at [phone] between the hours of 8:00 a.m. and 4:30 p.m.  Voicemails left after 4:00 p.m. will not be returned until the following business day.  For prescription refill requests, have your pharmacy contact our office and allow 72 hours.    Cancer Center Support Programs:   > Cancer Support Group  2nd Tuesday of the month 1pm-2pm, Journey Room

## 2019-06-03 LAB — PROTEIN ELECTROPHORESIS, SERUM
A/G Ratio: 0.6 — ABNORMAL LOW (ref 0.7–1.7)
Albumin ELP: 3.6 g/dL (ref 2.9–4.4)
Alpha-1-Globulin: 0.3 g/dL (ref 0.0–0.4)
Alpha-2-Globulin: 1.1 g/dL — ABNORMAL HIGH (ref 0.4–1.0)
Beta Globulin: 1.2 g/dL (ref 0.7–1.3)
Gamma Globulin: 3.7 g/dL — ABNORMAL HIGH (ref 0.4–1.8)
Globulin, Total: 6.2 g/dL — ABNORMAL HIGH (ref 2.2–3.9)
M-Spike, %: 2.8 g/dL — ABNORMAL HIGH
Total Protein ELP: 9.8 g/dL — ABNORMAL HIGH (ref 6.0–8.5)

## 2019-06-03 LAB — IMMUNOFIXATION ELECTROPHORESIS
IgA: 3071 mg/dL — ABNORMAL HIGH (ref 87–352)
IgG (Immunoglobin G), Serum: 875 mg/dL (ref 586–1602)
IgM (Immunoglobulin M), Srm: 60 mg/dL (ref 26–217)
Total Protein ELP: 9.6 g/dL — ABNORMAL HIGH (ref 6.0–8.5)

## 2019-06-03 LAB — KAPPA/LAMBDA LIGHT CHAINS
Kappa free light chain: 16.1 mg/L (ref 3.3–19.4)
Kappa, lambda light chain ratio: 0.11 — ABNORMAL LOW (ref 0.26–1.65)
Lambda free light chains: 140.4 mg/L — ABNORMAL HIGH (ref 5.7–26.3)

## 2019-06-03 LAB — BETA 2 MICROGLOBULIN, SERUM: Beta-2 Microglobulin: 3.8 mg/L — ABNORMAL HIGH (ref 0.6–2.4)

## 2019-06-04 ENCOUNTER — Other Ambulatory Visit (HOSPITAL_COMMUNITY): Payer: Self-pay | Admitting: *Deleted

## 2019-06-04 DIAGNOSIS — D892 Hypergammaglobulinemia, unspecified: Secondary | ICD-10-CM

## 2019-06-04 LAB — PROTEIN, URINE, 24 HOUR
Collection Interval-UPROT: 24 hours
Protein, 24H Urine: 130 mg/d — ABNORMAL HIGH (ref 50–100)
Protein, Urine: 7 mg/dL
Urine Total Volume-UPROT: 1850 mL

## 2019-06-10 LAB — UPEP/UIFE/LIGHT CHAINS/TP, 24-HR UR
% BETA, Urine: UNDETERMINED %
ALPHA 1 URINE: UNDETERMINED %
Albumin, U: UNDETERMINED %
Alpha 2, Urine: UNDETERMINED %
Free Kappa Lt Chains,Ur: 21.17 mg/L (ref 0.63–113.79)
Free Kappa/Lambda Ratio: 0.3 — ABNORMAL LOW (ref 1.03–31.76)
Free Lambda Lt Chains,Ur: 71.17 mg/L — ABNORMAL HIGH (ref 0.47–11.77)
GAMMA GLOBULIN URINE: UNDETERMINED %
Immunofixation Result, Urine: UNDETERMINED
M-SPIKE %, Urine: UNDETERMINED %
Total Protein, Urine-Ur/day: 144 mg/24 hr (ref 30–150)
Total Protein, Urine: 7.8 mg/dL
Total Volume: 1850

## 2019-06-15 ENCOUNTER — Encounter (HOSPITAL_COMMUNITY): Payer: Self-pay | Admitting: Hematology

## 2019-06-15 ENCOUNTER — Inpatient Hospital Stay (HOSPITAL_COMMUNITY): Payer: BC Managed Care – PPO | Attending: Hematology | Admitting: Hematology

## 2019-06-15 ENCOUNTER — Other Ambulatory Visit: Payer: Self-pay

## 2019-06-15 ENCOUNTER — Other Ambulatory Visit (HOSPITAL_COMMUNITY): Payer: Self-pay | Admitting: *Deleted

## 2019-06-15 VITALS — BP 158/75 | HR 77 | Temp 97.5°F | Resp 18 | Wt 182.0 lb

## 2019-06-15 DIAGNOSIS — Z800 Family history of malignant neoplasm of digestive organs: Secondary | ICD-10-CM | POA: Insufficient documentation

## 2019-06-15 DIAGNOSIS — Z833 Family history of diabetes mellitus: Secondary | ICD-10-CM | POA: Insufficient documentation

## 2019-06-15 DIAGNOSIS — M545 Low back pain: Secondary | ICD-10-CM | POA: Insufficient documentation

## 2019-06-15 DIAGNOSIS — Z79899 Other long term (current) drug therapy: Secondary | ICD-10-CM | POA: Diagnosis not present

## 2019-06-15 DIAGNOSIS — G479 Sleep disorder, unspecified: Secondary | ICD-10-CM | POA: Diagnosis not present

## 2019-06-15 DIAGNOSIS — Z818 Family history of other mental and behavioral disorders: Secondary | ICD-10-CM | POA: Insufficient documentation

## 2019-06-15 DIAGNOSIS — D892 Hypergammaglobulinemia, unspecified: Secondary | ICD-10-CM

## 2019-06-15 DIAGNOSIS — Z8379 Family history of other diseases of the digestive system: Secondary | ICD-10-CM | POA: Insufficient documentation

## 2019-06-15 DIAGNOSIS — Z8249 Family history of ischemic heart disease and other diseases of the circulatory system: Secondary | ICD-10-CM | POA: Insufficient documentation

## 2019-06-15 DIAGNOSIS — Z809 Family history of malignant neoplasm, unspecified: Secondary | ICD-10-CM | POA: Insufficient documentation

## 2019-06-15 DIAGNOSIS — D472 Monoclonal gammopathy: Secondary | ICD-10-CM | POA: Insufficient documentation

## 2019-06-15 LAB — PROTEIN, URINE, 24 HOUR
Collection Interval-UPROT: 24 hours
Protein, 24H Urine: 286 mg/d — ABNORMAL HIGH (ref 50–100)
Protein, Urine: 11 mg/dL
Urine Total Volume-UPROT: 2600 mL

## 2019-06-15 MED ORDER — TRAMADOL HCL 50 MG PO TABS: 50.0000 mg | ORAL_TABLET | Freq: Every evening | ORAL | 0 refills | Status: DC | PRN

## 2019-06-15 NOTE — Assessment & Plan Note (Addendum)
1.  IgA lambda monoclonal gammopathy: - SPEP on 06/02/2019 shows 2.8 g/dL.  Beta-2 microglobulin is 3.8. -Kappa light chains are 16.1, lambda light chains 140.4, ratio of 0.11. -Hemoglobin is 11.4.  LDH is 132.  Creatinine is 0.78 and calcium is 9.9 with albumin of 3.4. - Skeletal survey on 06/02/2019 showed old L1 compression fracture.  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  No evidence of lytic lesions. - She denies any B symptoms.  Denies any neuropathy symptoms. - She got back 24-hour urine for further testing. - I have strongly recommended doing a bone marrow aspiration and biopsy at this time. -She would like to think about it and get back to [place].  I have also recommended a PET CT scan to evaluate for plasmacytomas and or lytic lesions. -She had back pain for the last few weeks which moved to the upper back on the left side.  She is having difficulty sleeping because of pain.  She is using Goody powders.  I have recommended not to use any Goody powders.  I have given prescription for tramadol 50 mg to be used at bedtime. -I have also offered a referral for second opinion if she would like to have one.  2.  Family history: -No family history of multiple myeloma. -Brother had colon cancer.  Another brother had pancreatic cancer.  Father had stomach cancer.

## 2019-06-15 NOTE — Progress Notes (Signed)
Stephanie Galvan, [place] [address]   CLINIC:  Medical Oncology/Hematology  PCP:  [name], MD [place] [id number] [phone]   REASON FOR VISIT:  Follow-up for monoclonal gammopathy.  CURRENT THERAPY: Under work-up.    INTERVAL HISTORY:  [name] 63 y.o. female seen for follow-up of monoclonal gammopathy.  Denies any fevers, night sweats or weight loss in the last 6 months.  Appetite and energy levels are 100%.  She reported some pain in the lower back which now moved to the upper back region.  She is having sleep problems because of the pain.  She is currently taking Goody powder.  Denies any nausea, vomiting, diarrhea or constipation.  Denies any bleeding per rectum or melena.    REVIEW OF SYSTEMS:  Review of Systems  Musculoskeletal: Positive for back pain.  Psychiatric/Behavioral: Positive for sleep disturbance.  All other systems reviewed and are negative.    PAST MEDICAL/SURGICAL HISTORY:  Past Medical History:  Diagnosis Date   Breast cancer ([place]) 2006   left breast   Breast mass, right    High cholesterol    History of bladder infections    Hypertension    Rash Aug 2013   Past Surgical History:  Procedure Laterality Date   ABDOMINAL HYSTERECTOMY     just uterus removed   BREAST BIOPSY     BREAST LUMPECTOMY WITH RADIOACTIVE SEED LOCALIZATION Right 07/24/2018   Procedure: RIGHT BREAST LUMPECTOMY WITH RADIOACTIVE SEED LOCALIZATION;  Surgeon: Coralie Keens, MD;  Location: [facility];  Service: General;  Laterality: Right;   MASTECTOMY  5/06   left   PARTIAL HYSTERECTOMY     PORT-A-CATH REMOVAL     PORTACATH PLACEMENT       SOCIAL HISTORY:  Social History   Socioeconomic History   Marital status: Married    Spouse name: Not on file   Number of children: 2   Years of education: Not on file   Highest education level: Not on file  Occupational History      Employer: [employer] resource strain: Not on file   Food insecurity    Worry: Not on file    Inability: Not on file   Transportation needs    Medical: Not on file    Non-medical: Not on file  Tobacco Use   Smoking status: Never Smoker   Smokeless tobacco: Never Used  Substance and Sexual Activity   Alcohol use: No   Drug use: No   Sexual activity: Yes    Birth control/protection: Surgical  Lifestyle   Physical activity    Days per week: Not on file    Minutes per session: Not on file   Stress: Not on file  Relationships   Social connections    Talks on phone: Not on file    Gets together: Not on file    Attends religious service: Not on file    Active member of club or organization: Not on file    Attends meetings of clubs or organizations: Not on file    Relationship status: Not on file   Intimate partner violence    Fear of current or ex partner: Not on file    Emotionally abused: Not on file    Physically abused: Not on file    Forced sexual activity: Not on file  Other Topics Concern   Not on file  Social History Narrative  Not on file    FAMILY HISTORY:  Family History  Problem Relation Age of Onset   Dementia Mother    Stomach cancer Father    Cancer Brother    Cancer Brother    Diabetes Brother    Colitis Daughter    Hypertension Daughter    Hypertension Daughter     CURRENT MEDICATIONS:  Outpatient Encounter Medications as of 06/15/2019  Medication Sig   atenolol (TENORMIN) 50 MG tablet Take 50 mg by mouth daily.     hydrochlorothiazide 25 MG tablet Take 25 mg by mouth daily.     potassium chloride (K-DUR) 10 MEQ tablet Take 10 mEq by mouth daily.   Aspirin-Salicylamide-Caffeine (BC HEADACHE POWDER PO) Take 1 tablet by mouth as needed.    [DISCONTINUED] potassium chloride SA (K-DUR,KLOR-CON) 20 MEQ tablet Take 1 tablet (20 mEq total) by mouth daily.   No facility-administered encounter  medications on file as of 06/15/2019.     ALLERGIES:  No Known Allergies   PHYSICAL EXAM:  ECOG Performance status: 1  Vitals:   06/15/19 1511  BP: (!) 158/75  Pulse: 77  Resp: 18  Temp: (!) 97.5 F (36.4 C)  SpO2: 100%   Filed Weights   06/15/19 1511  Weight: 182 lb (82.6 kg)    Physical Exam Vitals signs reviewed.  Constitutional:      Appearance: Normal appearance.  Cardiovascular:     Rate and Rhythm: Normal rate and regular rhythm.     Heart sounds: Normal heart sounds.  Pulmonary:     Effort: Pulmonary effort is normal.     Breath sounds: Normal breath sounds.  Abdominal:     General: There is no distension.     Palpations: Abdomen is soft. There is no mass.  Musculoskeletal:        General: No swelling.  Skin:    General: Skin is warm.  Neurological:     General: No focal deficit present.     Mental Status: She is alert and oriented to person, place, and time.  Psychiatric:        Mood and Affect: Mood normal.        Behavior: Behavior normal.      LABORATORY DATA:  I have reviewed the labs as listed.  CBC    Component Value Date/Time   WBC 5.0 06/02/2019 1506   RBC 3.85 (L) 06/02/2019 1506   HGB 11.4 (L) 06/02/2019 1506   HCT 36.6 06/02/2019 1506   PLT 283 06/02/2019 1506   MCV 95.1 06/02/2019 1506   MCH 29.6 06/02/2019 1506   MCHC 31.1 06/02/2019 1506   RDW 13.0 06/02/2019 1506   LYMPHSABS 1.9 06/02/2019 1506   MONOABS 0.4 06/02/2019 1506   EOSABS 0.2 06/02/2019 1506   BASOSABS 0.0 06/02/2019 1506   CMP Latest Ref Rng & Units 06/02/2019 07/17/2018 04/26/2015  Glucose 70 - 99 mg/dL 103(H) 104(H) 102(H)  BUN 8 - 23 mg/dL 14 16 21(H)  Creatinine 0.44 - 1.00 mg/dL 0.78 0.79 0.90  Sodium 135 - 145 mmol/L 138 137 138  Potassium 3.5 - 5.1 mmol/L 3.6 3.3(L) 3.7  Chloride 98 - 111 mmol/L 100 99 101  CO2 22 - 32 mmol/L _0 Calcium 8.9 - 10.3 mg/dL 9.9 9.2 9.4  Total Protein 6.5 - 8.1 g/dL 9.8(H) - -  Total Bilirubin 0.3 - 1.2 mg/dL 0.6  - -  Alkaline Phos 38 - 126 U/L 120 - -  AST 15 - 41 U/L 30 - -  ALT 0 - 44 U/L 36 - -       DIAGNOSTIC IMAGING:  I have independently reviewed the scans and discussed with the patient.     ASSESSMENT & PLAN:   IgA monoclonal gammopathy 1.  IgA lambda monoclonal gammopathy: - SPEP on 06/02/2019 shows 2.8 g/dL.  Beta-2 microglobulin is 3.8. -Kappa light chains are 16.1, lambda light chains 140.4, ratio of 0.11. -Hemoglobin is 11.4.  LDH is 132.  Creatinine is 0.78 and calcium is 9.9 with albumin of 3.4. - Skeletal survey on 06/02/2019 showed old L1 compression fracture.  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  No evidence of lytic lesions. - She denies any B symptoms.  Denies any neuropathy symptoms. - She got back 24-hour urine for further testing. - I have strongly recommended doing a bone marrow aspiration and biopsy at this time. -She would like to think about it and get back to [place].  I have also recommended a PET CT scan to evaluate for plasmacytomas and or lytic lesions. -She had back pain for the last few weeks which moved to the upper back on the left side.  She is having difficulty sleeping because of pain.  She is using Goody powders.  I have recommended not to use any Goody powders.  I have given prescription for tramadol 50 mg to be used at bedtime. -I have also offered a referral for second opinion if she would like to have one.  2.  Family history: -No family history of multiple myeloma. -Brother had colon cancer.  Another brother had pancreatic cancer.  Father had stomach cancer.   Total time spent is 25 minutes with more than 50% of the time spent face-to-face discussing lab results, further work-up, counseling and coordination of care.  Orders placed this encounter:  Orders Placed This Encounter  Procedures   24 hr, Ur UPEP/UIFE/Light Chains/TP   Protein, urine, 24 hour      Derek Jack, MD [place] [phone]

## 2019-06-15 NOTE — Patient Instructions (Addendum)
[place] at [facility] Discharge Instructions  You were seen today by Dr. Delton Coombes. He went over your recent lab results. Discussed in detail what your test results mean and what your next step is. He discussed a bone marrow biopsy with you and the possible outcomes. He will get you scheduled for the bone marrow biopsy in [place].   IgA Lambda monoclonal gammopathy  Thank you for choosing [place] at [facility] to provide your oncology and hematology care.  To afford each patient quality time with our provider, please arrive at least 15 minutes before your scheduled appointment time.   If you have a lab appointment with the [facility] please come in thru the  Main Entrance and check in at the main information desk  You need to re-schedule your appointment should you arrive 10 or more minutes late.  We strive to give you quality time with our providers, and arriving late affects you and other patients whose appointments are after yours.  Also, if you no show three or more times for appointments you may be dismissed from the clinic at the providers discretion.     Again, thank you for choosing [facility]  Our hope is that these requests will decrease the amount of time that you wait before being seen by our physicians.       _____________________________________________________________  Should you have questions after your visit to [facility], please contact our office at [phone] between the hours of 8:00 a.m. and 4:30 p.m.  Voicemails left after 4:00 p.m. will not be returned until the following business day.  For prescription refill requests, have your pharmacy contact our office and allow 72 hours.    Cancer Center Support Programs:   > Cancer Support Group  2nd Tuesday of the month 1pm-2pm, Journey Room

## 2019-06-16 ENCOUNTER — Other Ambulatory Visit (HOSPITAL_COMMUNITY): Payer: Self-pay | Admitting: *Deleted

## 2019-06-16 DIAGNOSIS — D472 Monoclonal gammopathy: Secondary | ICD-10-CM

## 2019-06-17 ENCOUNTER — Other Ambulatory Visit (HOSPITAL_COMMUNITY): Payer: Self-pay | Admitting: *Deleted

## 2019-06-17 DIAGNOSIS — D472 Monoclonal gammopathy: Secondary | ICD-10-CM

## 2019-06-17 LAB — UPEP/UIFE/LIGHT CHAINS/TP, 24-HR UR
% BETA, Urine: 18.8 %
ALPHA 1 URINE: 2.3 %
Albumin, U: 14.9 %
Alpha 2, Urine: 14.2 %
Free Kappa Lt Chains,Ur: 42.22 mg/L (ref 0.63–113.79)
Free Kappa/Lambda Ratio: 0.3 — ABNORMAL LOW (ref 1.03–31.76)
Free Lambda Lt Chains,Ur: 140.6 mg/L — ABNORMAL HIGH (ref 0.47–11.77)
GAMMA GLOBULIN URINE: 49.9 %
M-SPIKE %, Urine: 27.8 % — ABNORMAL HIGH
M-Spike, Mg/24 Hr: 53 mg/24 hr — ABNORMAL HIGH
Total Protein, Urine-Ur/day: 190 mg/24 hr — ABNORMAL HIGH (ref 30–150)
Total Protein, Urine: 7.3 mg/dL
Total Volume: 2600

## 2019-06-18 ENCOUNTER — Encounter (HOSPITAL_COMMUNITY): Payer: Self-pay | Admitting: *Deleted

## 2019-06-18 ENCOUNTER — Telehealth (HOSPITAL_COMMUNITY): Payer: Self-pay | Admitting: Hematology

## 2019-06-18 DIAGNOSIS — S129XXA Fracture of neck, unspecified, initial encounter: Secondary | ICD-10-CM

## 2019-06-18 HISTORY — DX: Fracture of neck, unspecified, initial encounter: S12.9XXA

## 2019-06-18 NOTE — Progress Notes (Signed)
Patient called the clinic reporting pain that has not gotten any better. It is the same pain she discussed with Dr. Delton Coombes when she was here last. She reports not having picked up the pain medication that Dr. Delton Coombes sent in for her.  I have advised her to try the pain medication already prescribed and to let us know if it is not working.  She verbalizes understanding.

## 2019-06-19 ENCOUNTER — Ambulatory Visit (HOSPITAL_COMMUNITY): Admission: RE | Admit: 2019-06-19 | Payer: BC Managed Care – PPO | Source: Ambulatory Visit

## 2019-06-19 ENCOUNTER — Other Ambulatory Visit: Payer: Self-pay | Admitting: Radiology

## 2019-06-19 ENCOUNTER — Other Ambulatory Visit (HOSPITAL_COMMUNITY): Payer: Self-pay | Admitting: Nurse Practitioner

## 2019-06-19 ENCOUNTER — Ambulatory Visit (HOSPITAL_COMMUNITY): Payer: BC Managed Care – PPO

## 2019-06-19 ENCOUNTER — Other Ambulatory Visit: Payer: Self-pay

## 2019-06-19 ENCOUNTER — Telehealth (HOSPITAL_COMMUNITY): Payer: Self-pay | Admitting: Hematology

## 2019-06-19 ENCOUNTER — Other Ambulatory Visit (HOSPITAL_COMMUNITY): Payer: Self-pay | Admitting: Family Medicine

## 2019-06-19 ENCOUNTER — Telehealth (HOSPITAL_COMMUNITY): Payer: Self-pay | Admitting: *Deleted

## 2019-06-19 ENCOUNTER — Ambulatory Visit (HOSPITAL_COMMUNITY)
Admission: RE | Admit: 2019-06-19 | Discharge: 2019-06-19 | Disposition: A | Discharge status: Home / Self Care | Payer: BC Managed Care – PPO | Source: Ambulatory Visit | Attending: Family Medicine | Admitting: Family Medicine

## 2019-06-19 DIAGNOSIS — M5489 Other dorsalgia: Secondary | ICD-10-CM | POA: Diagnosis present

## 2019-06-19 DIAGNOSIS — D472 Monoclonal gammopathy: Secondary | ICD-10-CM

## 2019-06-19 DIAGNOSIS — S22020A Wedge compression fracture of second thoracic vertebra, initial encounter for closed fracture: Secondary | ICD-10-CM

## 2019-06-19 MED ORDER — TRAMADOL HCL 50 MG PO TABS: 50.0000 mg | ORAL_TABLET | Freq: Every evening | ORAL | 0 refills | Status: AC | PRN

## 2019-06-19 MED ORDER — HYDROCODONE-ACETAMINOPHEN 5-325 MG PO TABS: 1.0000 | ORAL_TABLET | Freq: Four times a day (QID) | ORAL | 0 refills | Status: DC | PRN

## 2019-06-19 NOTE — Telephone Encounter (Signed)
Pt called the clinic to report that she picked up the tramadol we prescribed for pain.  She states she is taking it and is getting no pain relief.  She said her back was hurting to the point that it is making it difficult for her to walk and get around as she normally does.  Discussed with Cristela Felt, NP - Rx for Norco 5-325 sent to [facility] in [place] by NP.  Pt notified to pick up this Rx and take INSTEAD of the tramadol.  Instructed to NOT take the two medications together.  She verbalizes understanding.

## 2019-06-19 NOTE — Telephone Encounter (Signed)
30 day supply of Tramadol 50mg  was denied by insurance. Per Nikki/csr a 7 day supply must be submitted as the first fill. NP Lala Lund Escribed a new 7 day fill to pts rx.

## 2019-06-22 ENCOUNTER — Other Ambulatory Visit: Payer: Self-pay

## 2019-06-22 ENCOUNTER — Encounter (HOSPITAL_COMMUNITY): Payer: Self-pay

## 2019-06-22 ENCOUNTER — Ambulatory Visit (HOSPITAL_COMMUNITY)
Admission: RE | Admit: 2019-06-22 | Discharge: 2019-06-22 | Disposition: A | Discharge status: Home / Self Care | Payer: BC Managed Care – PPO | Source: Ambulatory Visit | Attending: Hematology | Admitting: Hematology

## 2019-06-22 DIAGNOSIS — I10 Essential (primary) hypertension: Secondary | ICD-10-CM | POA: Insufficient documentation

## 2019-06-22 DIAGNOSIS — Z7982 Long term (current) use of aspirin: Secondary | ICD-10-CM | POA: Diagnosis not present

## 2019-06-22 DIAGNOSIS — Z853 Personal history of malignant neoplasm of breast: Secondary | ICD-10-CM | POA: Diagnosis not present

## 2019-06-22 DIAGNOSIS — D649 Anemia, unspecified: Secondary | ICD-10-CM | POA: Insufficient documentation

## 2019-06-22 DIAGNOSIS — C9000 Multiple myeloma not having achieved remission: Secondary | ICD-10-CM | POA: Diagnosis not present

## 2019-06-22 DIAGNOSIS — D472 Monoclonal gammopathy: Secondary | ICD-10-CM | POA: Diagnosis not present

## 2019-06-22 DIAGNOSIS — Z79899 Other long term (current) drug therapy: Secondary | ICD-10-CM | POA: Insufficient documentation

## 2019-06-22 LAB — CBC WITH DIFFERENTIAL/PLATELET
Abs Immature Granulocytes: 0.01 10*3/uL (ref 0.00–0.07)
Basophils Absolute: 0 10*3/uL (ref 0.0–0.1)
Basophils Relative: 1 %
Eosinophils Absolute: 0.2 10*3/uL (ref 0.0–0.5)
Eosinophils Relative: 3 %
HCT: 36 % (ref 36.0–46.0)
Hemoglobin: 11.2 g/dL — ABNORMAL LOW (ref 12.0–15.0)
Immature Granulocytes: 0 %
Lymphocytes Relative: 26 %
Lymphs Abs: 1.4 10*3/uL (ref 0.7–4.0)
MCH: 29.5 pg (ref 26.0–34.0)
MCHC: 31.1 g/dL (ref 30.0–36.0)
MCV: 94.7 fL (ref 80.0–100.0)
Monocytes Absolute: 0.6 10*3/uL (ref 0.1–1.0)
Monocytes Relative: 12 %
Neutro Abs: 3.1 10*3/uL (ref 1.7–7.7)
Neutrophils Relative %: 58 %
Platelets: 326 10*3/uL (ref 150–400)
RBC: 3.8 MIL/uL — ABNORMAL LOW (ref 3.87–5.11)
RDW: 13 % (ref 11.5–15.5)
WBC: 5.3 10*3/uL (ref 4.0–10.5)
nRBC: 0 % (ref 0.0–0.2)

## 2019-06-22 LAB — PROTIME-INR
INR: 1 (ref 0.8–1.2)
Prothrombin Time: 13.2 seconds (ref 11.4–15.2)

## 2019-06-22 MED ORDER — FENTANYL CITRATE (PF) 100 MCG/2ML IJ SOLN
INTRAMUSCULAR | Status: AC
  Filled 2019-06-22: qty 2

## 2019-06-22 MED ORDER — SODIUM CHLORIDE 0.9 % IV SOLN
INTRAVENOUS | Status: DC
  Administered 2019-06-22: 08:00:00 via INTRAVENOUS

## 2019-06-22 MED ORDER — LIDOCAINE HCL (PF) 1 % IJ SOLN
INTRAMUSCULAR | Status: AC | PRN
  Administered 2019-06-22: 5 mL

## 2019-06-22 MED ORDER — MIDAZOLAM HCL 2 MG/2ML IJ SOLN
INTRAMUSCULAR | Status: AC | PRN
  Administered 2019-06-22 (×2): 1 mg via INTRAVENOUS

## 2019-06-22 MED ORDER — MIDAZOLAM HCL 2 MG/2ML IJ SOLN
INTRAMUSCULAR | Status: AC
  Filled 2019-06-22: qty 4

## 2019-06-22 MED ORDER — FENTANYL CITRATE (PF) 100 MCG/2ML IJ SOLN
INTRAMUSCULAR | Status: AC | PRN
  Administered 2019-06-22 (×2): 50 ug via INTRAVENOUS

## 2019-06-22 NOTE — Consult Note (Signed)
Chief Complaint: Patient was seen in consultation today for CT-guided bone marrow biopsy  Referring Physician(s): Katragadda,Sreedhar  Supervising Physician: Sandi Mariscal  Patient Status: [facility] - Out-pt  History of Present Illness: Stephanie Galvan is a 63 y.o. female with remote history of left breast carcinoma, back pain and IgA monoclonal gammopathy who presents today for CT-guided bone marrow biopsy for further evaluation.  Past Medical History:  Diagnosis Date  . Breast cancer ([place]) 2006   left breast  . Breast mass, right   . Compression fracture of cervical spine ([place]) 06/18/2019   recent Chest xray stating had fx.  . High cholesterol   . History of bladder infections   . Hypertension   . Rash Aug 2013    Past Surgical History:  Procedure Laterality Date  . ABDOMINAL HYSTERECTOMY     just uterus removed  . BREAST BIOPSY    . BREAST LUMPECTOMY WITH RADIOACTIVE SEED LOCALIZATION Right 07/24/2018   Procedure: RIGHT BREAST LUMPECTOMY WITH RADIOACTIVE SEED LOCALIZATION;  Surgeon: Coralie Keens, MD;  Location: [facility];  Service: General;  Laterality: Right;  . MASTECTOMY  5/06   left  . PARTIAL HYSTERECTOMY    . PORT-A-CATH REMOVAL    . PORTACATH PLACEMENT      Allergies: Patient has no known allergies.  Medications: Prior to Admission medications   Medication Sig Start Date End Date Taking? Authorizing Provider  Aspirin-Salicylamide-Caffeine (BC HEADACHE POWDER PO) Take 1 tablet by mouth as needed.    Yes [provider]  atenolol (TENORMIN) 50 MG tablet Take 50 mg by mouth daily.     Yes [provider]  hydrochlorothiazide 25 MG tablet Take 25 mg by mouth daily.     Yes [provider]  HYDROcodone-acetaminophen (NORCO) 5-325 MG tablet Take 1 tablet by mouth every 6 (six) hours as needed for moderate pain. 06/19/19  Yes Lockamy, Randi L, NP-C  potassium chloride (K-DUR) 10 MEQ tablet Take 10 mEq by mouth daily.  03/19/19  Yes [provider]  traMADol (ULTRAM) 50 MG tablet Take 1 tablet (50 mg total) by mouth at bedtime as needed. 06/15/19  Yes Derek Jack, MD  traMADol (ULTRAM) 50 MG tablet Take 1 tablet (50 mg total) by mouth at bedtime as needed for up to 7 days. 06/19/19 06/26/19  Glennie Isle, NP-C     Family History  Problem Relation Age of Onset  . Dementia Mother   . Stomach cancer Father   . Cancer Brother   . Cancer Brother   . Diabetes Brother   . Colitis Daughter   . Hypertension Daughter   . Hypertension Daughter     Social History   Socioeconomic History  . Marital status: Married    Spouse name: Not on file  . Number of children: 2  . Years of education: Not on file  . Highest education level: Not on file  Occupational History    Employer: [employer]  . Financial resource strain: Not on file  . Food insecurity    Worry: Not on file    Inability: Not on file  . Transportation needs    Medical: Not on file    Non-medical: Not on file  Tobacco Use  . Smoking status: Never Smoker  . Smokeless tobacco: Never Used  Substance and Sexual Activity  . Alcohol use: No  . Drug use: No  . Sexual activity: Yes    Birth control/protection:  Surgical  Lifestyle  . Physical activity    Days per week: Not on file    Minutes per session: Not on file  . Stress: Not on file  Relationships  . Social Herbalist on phone: Not on file    Gets together: Not on file    Attends religious service: Not on file    Active member of club or organization: Not on file    Attends meetings of clubs or organizations: Not on file    Relationship status: Not on file  Other Topics Concern  . Not on file  Social History Narrative  . Not on file      Review of Systems denies fever, headache, chest pain, cough, abdominal pain, nausea, vomiting or bleeding.  She does have some dyspnea with exertion and moderate back pain.  Vital Signs: Ht 5' 9"   (1.753 m)   Wt 179 lb (81.2 kg)   BMI 26.43 kg/m   Physical Exam awake, alert.  Chest with slightly diminished breath sounds right base, left clear.  Heart with regular rate and rhythm.  Abdomen soft, positive bowel sounds, nontender.  Extremities with full range of motion.  Moderate back pain noted.  Imaging: Dg Thoracic Spine W/swimmers  Result Date: 06/19/2019 CLINICAL DATA:  63 year old female with back pain radiating anteriorly, to the abdomen. No recent injury. EXAM: THORACIC SPINE - 3 VIEWS COMPARISON:  Lumbar radiographs today reported separately. CT Chest, Abdomen, and Pelvis 04/26/2015. FINDINGS: Thoracic segmentation appears normal. T12 and L1 compression fractures redemonstrated. There are also up to moderate compression fractures of T10, T8 and T7 which are new since 2016. Above that it appears that the thoracic vertebral bodies are intact. Cervicothoracic junction alignment is within normal limits. Underlying thoracolumbar scoliosis is mild-to-moderate. Grossly intact posterior ribs. Negative visible chest and upper abdominal visceral contours. IMPRESSION: 1. New compression fractures of T7, T8, and T10 since 2016. Thoracic MRI or Nuclear Medicine Whole-body Bone Scan would best determine acuity. 2. Chronic T12 and L1 compression fractures. Electronically Signed   By: Genevie Ann M.D.   On: 06/19/2019 12:57   Dg Lumbar Spine Complete  Result Date: 06/19/2019 CLINICAL DATA:  63 year old female with back pain radiating anteriorly, to the abdomen. No recent injury. EXAM: LUMBAR SPINE - COMPLETE 4+ VIEW COMPARISON:  CT Chest, Abdomen, and Pelvis 04/26/2015. FINDINGS: Normal lumbar segmentation on the comparison CT, with full size ribs at T12. Stable lumbar lordosis. Mild superior and inferior endplate compression at L3 is new or increased since 2016. Mild L2 compression appears stable. Moderate compression of both T12 and L1 appears increased but was present in 2016. The L4 and L5 levels appear  to remain intact. Grossly intact visible sacrum and SI joints. Negative abdominal visceral contours. IMPRESSION: Chronic but increased compression fractures in the lower thoracic and upper lumbar spine since the 2016 CT. No convincing acute osseous abnormality, but Lumbar MRI or Nuclear Medicine Whole-body Bone Scan would have a higher sensitivity for acute compression. Electronically Signed   By: Genevie Ann M.D.   On: 06/19/2019 12:52   Dg Bone Survey Met  Result Date: 06/03/2019 CLINICAL DATA:  History of breast cancer. Elevated gamma globulin level. EXAM: METASTATIC BONE SURVEY COMPARISON:  PET scan of March 11, 2009.  CT scan of April 26, 2015. FINDINGS: Mild L1 compression deformity is noted consistent with old fracture as it was present on prior CT exam of 2016. Mild degenerative disc disease is noted at L4-5 and L5-S1.  No definite evidence of lytic or sclerotic bone lesions are noted. No acute fracture or dislocation is noted. IMPRESSION: No definite evidence of lytic or sclerotic bone lesions are noted in the skeleton. Old L1 compression fracture is noted. Electronically Signed   By: Marijo Conception M.D.   On: 06/03/2019 08:57    Labs:  CBC: Recent Labs    06/02/19 1506 06/22/19 0745  WBC 5.0 5.3  HGB 11.4* 11.2*  HCT 36.6 36.0  PLT 283 326    COAGS: Recent Labs    06/22/19 0745  INR 1.0    BMP: Recent Labs    07/17/18 1106 06/02/19 1506  NA 137 138  K 3.3* 3.6  CL 99 100  CO2 27 27  GLUCOSE 104* 103*  BUN 16 14  CALCIUM 9.2 9.9  CREATININE 0.79 0.78  GFRNONAA >60 >60  GFRAA >60 >60    LIVER FUNCTION TESTS: Recent Labs    06/02/19 1506  BILITOT 0.6  AST 30  ALT 36  ALKPHOS 120  PROT 9.8*  ALBUMIN 3.4*    TUMOR MARKERS: No results for input(s): AFPTM, CEA, CA199, CHROMGRNA in the last 8760 hours.  Assessment and Plan: 64 y.o. female with remote history of left breast carcinoma, back pain and IgA monoclonal gammopathy who presents today for CT-guided bone  marrow biopsy for further evaluation.Risks and benefits of procedure was discussed with the patient including, but not limited to bleeding, infection, damage to adjacent structures or low yield requiring additional tests.  All of the questions were answered and there is agreement to proceed.  Consent signed and in chart.      Thank you for this interesting consult.  I greatly enjoyed meeting Stephanie Galvan and look forward to participating in their care.  A copy of this report was sent to the requesting provider on this date.  Electronically Signed: D. Rowe Robert, PA-C 06/22/2019, 8:42 AM   I spent a total of 25 minutes    in face to face in clinical consultation, greater than 50% of which was counseling/coor minutesdinating care for CT-guided bone marrow biopsy

## 2019-06-22 NOTE — Procedures (Signed)
Pre-procedure Diagnosis: IgA monoclonal gammopathy  Post-procedure Diagnosis: Same  Technically successful CT guided bone marrow aspiration and biopsy of left iliac crest.   Complications: None Immediate  EBL: None  Signed: Sandi Mariscal Pager: [phone] 06/22/2019, 9:56 AM

## 2019-06-22 NOTE — Discharge Instructions (Signed)
Bone Marrow Aspiration and Bone Marrow Biopsy, Adult, Care After °This sheet gives you information about how to care for yourself after your procedure. Your health care provider may also give you more specific instructions. If you have problems or questions, contact your health care provider. °What can I expect after the procedure? °After the procedure, it is common to have: °· Mild pain and tenderness. °· Swelling. °· Bruising. °Follow these instructions at home: °Puncture site care ° °  ° °· Follow instructions from your health care provider about how to take care of the puncture site. Make sure you: °? Wash your hands with soap and water before you change your bandage (dressing). If soap and water are not available, use hand sanitizer. °? Change your dressing as told by your health care provider. °· Check your puncture site every day for signs of infection. Check for: °? More redness, swelling, or pain. °? More fluid or blood. °? Warmth. °? Pus or a bad smell. °General instructions °· Take over-the-counter and prescription medicines only as told by your health care provider. °· Do not take baths, swim, or use a hot tub until your health care provider approves. Ask if you can take a shower or have a sponge bath. °· Return to your normal activities as told by your health care provider. Ask your health care provider what activities are safe for you. °· Do not drive for 24 hours if you were given a medicine to help you relax (sedative) during your procedure. °· Keep all follow-up visits as told by your health care provider. This is important. °Contact a health care provider if: °· Your pain is not controlled with medicine. °Get help right away if: °· You have a fever. °· You have more redness, swelling, or pain around the puncture site. °· You have more fluid or blood coming from the puncture site. °· Your puncture site feels warm to the touch. °· You have pus or a bad smell coming from the puncture site. °These  symptoms may represent a serious problem that is an emergency. Do not wait to see if the symptoms will go away. Get medical help right away. Call your local emergency services (911 in the [place] Do not drive yourself to the hospital. °Summary °· After the procedure, it is common to have mild pain, tenderness, swelling, and bruising. °· Follow instructions from your health care provider about how to take care of the puncture site. °· Get help right away if you have any symptoms of infection or if you have more blood or fluid coming from the puncture site. °This information is not intended to replace advice given to you by your health care provider. Make sure you discuss any questions you have with your health care provider. °Document Released: 03/16/2005 Document Revised: 12/10/2017 Document Reviewed: 02/08/2016 °Elsevier Patient Education © 2020 [employer] ° ° ° ° °Moderate Conscious Sedation, Adult, Care After °These instructions provide you with information about caring for yourself after your procedure. Your health care provider may also give you more specific instructions. Your treatment has been planned according to current medical practices, but problems sometimes occur. Call your health care provider if you have any problems or questions after your procedure. °What can I expect after the procedure? °After your procedure, it is common: °· To feel sleepy for several hours. °· To feel clumsy and have poor balance for several hours. °· To have poor judgment for several hours. °· To vomit if you eat too soon. °  Follow these instructions at home: °For at least 24 hours after the procedure: ° °· Do not: °? Participate in activities where you could fall or become injured. °? Drive. °? Use heavy machinery. °? Drink alcohol. °? Take sleeping pills or medicines that cause drowsiness. °? Make important decisions or sign legal documents. °? Take care of children on your own. °· Rest. °Eating and drinking °· Follow the  diet recommended by your health care provider. °· If you vomit: °? Drink water, juice, or soup when you can drink without vomiting. °? Make sure you have little or no nausea before eating solid foods. °General instructions °· Have a responsible adult stay with you until you are awake and alert. °· Take over-the-counter and prescription medicines only as told by your health care provider. °· If you smoke, do not smoke without supervision. °· Keep all follow-up visits as told by your health care provider. This is important. °Contact a health care provider if: °· You keep feeling nauseous or you keep vomiting. °· You feel light-headed. °· You develop a rash. °· You have a fever. °Get help right away if: °· You have trouble breathing. °This information is not intended to replace advice given to you by your health care provider. Make sure you discuss any questions you have with your health care provider. °Document Released: 06/17/2013 Document Revised: 08/09/2017 Document Reviewed: 12/17/2015 °Elsevier Patient Education © 2020 [employer] ° °

## 2019-06-25 ENCOUNTER — Encounter: Payer: Self-pay | Admitting: Hematology

## 2019-06-25 ENCOUNTER — Ambulatory Visit (HOSPITAL_COMMUNITY): Payer: BC Managed Care – PPO

## 2019-07-01 ENCOUNTER — Encounter (HOSPITAL_COMMUNITY): Payer: Self-pay | Admitting: Hematology

## 2019-07-02 ENCOUNTER — Encounter (HOSPITAL_COMMUNITY): Payer: Self-pay | Admitting: Hematology

## 2019-07-03 LAB — SURGICAL PATHOLOGY

## 2019-07-13 ENCOUNTER — Other Ambulatory Visit (HOSPITAL_COMMUNITY): Payer: Self-pay | Admitting: Family Medicine

## 2019-07-13 DIAGNOSIS — Z780 Asymptomatic menopausal state: Secondary | ICD-10-CM

## 2019-07-21 ENCOUNTER — Ambulatory Visit (HOSPITAL_COMMUNITY)
Admission: RE | Admit: 2019-07-21 | Discharge: 2019-07-21 | Disposition: A | Discharge status: Home / Self Care | Payer: BC Managed Care – PPO | Source: Ambulatory Visit | Attending: Family Medicine | Admitting: Family Medicine

## 2019-07-21 ENCOUNTER — Other Ambulatory Visit: Payer: Self-pay

## 2019-07-21 DIAGNOSIS — Z780 Asymptomatic menopausal state: Secondary | ICD-10-CM | POA: Diagnosis not present

## 2019-07-23 ENCOUNTER — Ambulatory Visit (HOSPITAL_COMMUNITY)
Admission: RE | Admit: 2019-07-23 | Discharge: 2019-07-23 | Disposition: A | Discharge status: Home / Self Care | Payer: BC Managed Care – PPO | Source: Ambulatory Visit | Attending: Hematology | Admitting: Hematology

## 2019-07-23 ENCOUNTER — Encounter (HOSPITAL_COMMUNITY): Payer: Self-pay | Admitting: Hematology

## 2019-07-23 ENCOUNTER — Other Ambulatory Visit: Payer: Self-pay

## 2019-07-23 ENCOUNTER — Inpatient Hospital Stay (HOSPITAL_COMMUNITY): Payer: BC Managed Care – PPO

## 2019-07-23 ENCOUNTER — Inpatient Hospital Stay (HOSPITAL_COMMUNITY): Payer: BC Managed Care – PPO | Attending: Hematology | Admitting: Hematology

## 2019-07-23 VITALS — BP 148/81 | HR 77 | Temp 97.1°F | Resp 18 | Wt 182.9 lb

## 2019-07-23 DIAGNOSIS — Z8249 Family history of ischemic heart disease and other diseases of the circulatory system: Secondary | ICD-10-CM | POA: Insufficient documentation

## 2019-07-23 DIAGNOSIS — D472 Monoclonal gammopathy: Secondary | ICD-10-CM | POA: Insufficient documentation

## 2019-07-23 DIAGNOSIS — Z79899 Other long term (current) drug therapy: Secondary | ICD-10-CM | POA: Insufficient documentation

## 2019-07-23 DIAGNOSIS — C9000 Multiple myeloma not having achieved remission: Secondary | ICD-10-CM | POA: Diagnosis not present

## 2019-07-23 DIAGNOSIS — Z800 Family history of malignant neoplasm of digestive organs: Secondary | ICD-10-CM | POA: Diagnosis not present

## 2019-07-23 DIAGNOSIS — M7989 Other specified soft tissue disorders: Secondary | ICD-10-CM | POA: Insufficient documentation

## 2019-07-23 DIAGNOSIS — Z9012 Acquired absence of left breast and nipple: Secondary | ICD-10-CM | POA: Insufficient documentation

## 2019-07-23 DIAGNOSIS — Z853 Personal history of malignant neoplasm of breast: Secondary | ICD-10-CM | POA: Insufficient documentation

## 2019-07-23 DIAGNOSIS — Z833 Family history of diabetes mellitus: Secondary | ICD-10-CM | POA: Diagnosis not present

## 2019-07-23 DIAGNOSIS — Z808 Family history of malignant neoplasm of other organs or systems: Secondary | ICD-10-CM | POA: Insufficient documentation

## 2019-07-23 DIAGNOSIS — K5903 Drug induced constipation: Secondary | ICD-10-CM | POA: Diagnosis not present

## 2019-07-23 DIAGNOSIS — R200 Anesthesia of skin: Secondary | ICD-10-CM | POA: Insufficient documentation

## 2019-07-23 DIAGNOSIS — M545 Low back pain: Secondary | ICD-10-CM | POA: Diagnosis not present

## 2019-07-23 DIAGNOSIS — G479 Sleep disorder, unspecified: Secondary | ICD-10-CM | POA: Diagnosis not present

## 2019-07-23 DIAGNOSIS — R0602 Shortness of breath: Secondary | ICD-10-CM | POA: Diagnosis not present

## 2019-07-23 DIAGNOSIS — Z818 Family history of other mental and behavioral disorders: Secondary | ICD-10-CM | POA: Insufficient documentation

## 2019-07-23 DIAGNOSIS — T40605A Adverse effect of unspecified narcotics, initial encounter: Secondary | ICD-10-CM | POA: Insufficient documentation

## 2019-07-23 DIAGNOSIS — Z8379 Family history of other diseases of the digestive system: Secondary | ICD-10-CM | POA: Insufficient documentation

## 2019-07-23 LAB — URIC ACID: Uric Acid, Serum: 6.7 mg/dL (ref 2.5–7.1)

## 2019-07-23 LAB — BASIC METABOLIC PANEL
Anion gap: 13 (ref 5–15)
BUN: 18 mg/dL (ref 8–23)
CO2: 25 mmol/L (ref 22–32)
Calcium: 9.7 mg/dL (ref 8.9–10.3)
Chloride: 101 mmol/L (ref 98–111)
Creatinine, Ser: 0.79 mg/dL (ref 0.44–1.00)
GFR calc Af Amer: >60 mL/min (ref >60)
GFR calc non Af Amer: >60 mL/min (ref >60)
Glucose, Bld: 95 mg/dL (ref 70–99)
Potassium: 3.3 mmol/L — ABNORMAL LOW (ref 3.5–5.1)
Sodium: 139 mmol/L (ref 135–145)

## 2019-07-23 NOTE — Progress Notes (Signed)
[place], [place] [address]   CLINIC:  Medical Oncology/Hematology  PCP:  [name], MD [place] [id number] [phone]   REASON FOR VISIT:  Follow-up for monoclonal gammopathy.  CURRENT THERAPY: Under work-up.    INTERVAL HISTORY:  Stephanie Galvan 63 y.o. female seen for follow-up of plasma cell disorder.  In the interim she developed worsening pain in the lower back.  She was seen by Dr. Karie Kirks who ordered MRI of the spine.  This was done at the outpatient facility in [place].  I do not have access to those films or reports.  She started taking hydrocodone 10/325 mg 3 times a day.  She was also taking ibuprofen 800 mg, half tablet 3 times a day.  She reports that pain is not very well controlled.  The pain moved from the center to the left side of the spine in the lower back region.  Denies any bowel or bladder dysfunction.  Reports shortness of breath with activity.  Appetite is 100%.  Energy levels are 50%.  Reports numbness in the fingers at nighttime.  She is having sleep problems secondary to back pain.  Occasional constipation secondary to narcotics.    REVIEW OF SYSTEMS:  Review of Systems  Respiratory: Positive for shortness of breath.   Cardiovascular: Positive for leg swelling.  Gastrointestinal: Positive for constipation.  Musculoskeletal: Positive for back pain.  Psychiatric/Behavioral: Positive for sleep disturbance.  All other systems reviewed and are negative.    PAST MEDICAL/SURGICAL HISTORY:  Past Medical History:  Diagnosis Date  . Breast cancer ([facility]) 2006   left breast  . Breast mass, right   . Compression fracture of cervical spine ([facility]) 06/18/2019   recent Chest xray stating had fx.  . High cholesterol   . History of bladder infections   . Hypertension   . Rash Aug 2013   Past Surgical History:  Procedure Laterality Date  . ABDOMINAL HYSTERECTOMY     just uterus removed  .  BREAST BIOPSY    . BREAST LUMPECTOMY WITH RADIOACTIVE SEED LOCALIZATION Right 07/24/2018   Procedure: RIGHT BREAST LUMPECTOMY WITH RADIOACTIVE SEED LOCALIZATION;  Surgeon: Coralie Keens, MD;  Location: [facility];  Service: General;  Laterality: Right;  . MASTECTOMY  5/06   left  . PARTIAL HYSTERECTOMY    . PORT-A-CATH REMOVAL    . PORTACATH PLACEMENT       SOCIAL HISTORY:  Social History   Socioeconomic History  . Marital status: Married    Spouse name: Not on file  . Number of children: 2  . Years of education: Not on file  . Highest education level: Not on file  Occupational History    Employer: [employer]  . Financial resource strain: Not on file  . Food insecurity    Worry: Not on file    Inability: Not on file  . Transportation needs    Medical: Not on file    Non-medical: Not on file  Tobacco Use  . Smoking status: Never Smoker  . Smokeless tobacco: Never Used  Substance and Sexual Activity  . Alcohol use: No  . Drug use: No  . Sexual activity: Yes    Birth control/protection: Surgical  Lifestyle  . Physical activity    Days per week: Not on file    Minutes per session: Not on file  . Stress: Not on file  Relationships  . Social connections  Talks on phone: Not on file    Gets together: Not on file    Attends religious service: Not on file    Active member of club or organization: Not on file    Attends meetings of clubs or organizations: Not on file    Relationship status: Not on file  . Intimate partner violence    Fear of current or ex partner: Not on file    Emotionally abused: Not on file    Physically abused: Not on file    Forced sexual activity: Not on file  Other Topics Concern  . Not on file  Social History Narrative  . Not on file    FAMILY HISTORY:  Family History  Problem Relation Age of Onset  . Dementia Mother   . Stomach cancer Father   . Cancer Brother   . Cancer Brother   . Diabetes  Brother   . Colitis Daughter   . Hypertension Daughter   . Hypertension Daughter     CURRENT MEDICATIONS:  Outpatient Encounter Medications as of 07/23/2019  Medication Sig  . Aspirin-Salicylamide-Caffeine (BC HEADACHE POWDER PO) Take 1 tablet by mouth as needed.   Marland Kitchen atenolol (TENORMIN) 50 MG tablet Take 50 mg by mouth daily.    . hydrochlorothiazide 25 MG tablet Take 25 mg by mouth daily.    Marland Kitchen HYDROcodone-acetaminophen (NORCO) 10-325 MG tablet ORAL TAKE 1 TABLET UP TO 3 TIMES A DAY FOR SEVERE PAIN.  Marland Kitchen ibuprofen (ADVIL) 800 MG tablet Take 800 mg by mouth 3 (three) times daily. Pt sometimes takes a half tablet  . potassium chloride (K-DUR) 10 MEQ tablet Take 10 mEq by mouth daily.  . [DISCONTINUED] HYDROcodone-acetaminophen (NORCO) 5-325 MG tablet Take 1 tablet by mouth every 6 (six) hours as needed for moderate pain. (Patient not taking: Reported on 07/23/2019)  . [DISCONTINUED] traMADol (ULTRAM) 50 MG tablet Take 1 tablet (50 mg total) by mouth at bedtime as needed. (Patient not taking: Reported on 07/23/2019)   No facility-administered encounter medications on file as of 07/23/2019.     ALLERGIES:  No Known Allergies   PHYSICAL EXAM:  ECOG Performance status: 1  Vitals:   07/23/19 1119  BP: (!) 148/81  Pulse: 77  Resp: 18  Temp: (!) 97.1 F (36.2 C)  SpO2: 100%   Filed Weights   07/23/19 1119  Weight: 182 lb 14.4 oz (83 kg)    Physical Exam Vitals signs reviewed.  Constitutional:      Appearance: Normal appearance.  Cardiovascular:     Rate and Rhythm: Normal rate and regular rhythm.     Heart sounds: Normal heart sounds.  Pulmonary:     Effort: Pulmonary effort is normal.     Breath sounds: Normal breath sounds.  Abdominal:     General: There is no distension.     Palpations: Abdomen is soft. There is no mass.  Musculoskeletal:        General: No swelling.  Skin:    General: Skin is warm.  Neurological:     General: No focal deficit present.     Mental  Status: She is alert and oriented to person, place, and time.  Psychiatric:        Mood and Affect: Mood normal.        Behavior: Behavior normal.      LABORATORY DATA:  I have reviewed the labs as listed.  CBC    Component Value Date/Time   WBC 5.3 06/22/2019 0745   RBC  3.80 (L) 06/22/2019 0745   HGB 11.2 (L) 06/22/2019 0745   HCT 36.0 06/22/2019 0745   PLT 326 06/22/2019 0745   MCV 94.7 06/22/2019 0745   MCH 29.5 06/22/2019 0745   MCHC 31.1 06/22/2019 0745   RDW 13.0 06/22/2019 0745   LYMPHSABS 1.4 06/22/2019 0745   MONOABS 0.6 06/22/2019 0745   EOSABS 0.2 06/22/2019 0745   BASOSABS 0.0 06/22/2019 0745   CMP Latest Ref Rng & Units 07/23/2019 06/02/2019 07/17/2018  Glucose 70 - 99 mg/dL 95 103(H) 104(H)  BUN 8 - 23 mg/dL _0 Creatinine 0.44 - 1.00 mg/dL 0.79 0.78 0.79  Sodium 135 - 145 mmol/L 139 138 137  Potassium 3.5 - 5.1 mmol/L 3.3(L) 3.6 3.3(L)  Chloride 98 - 111 mmol/L 101 100 99  CO2 22 - 32 mmol/L _1 Calcium 8.9 - 10.3 mg/dL 9.7 9.9 9.2  Total Protein 6.5 - 8.1 g/dL - 9.8(H) -  Total Bilirubin 0.3 - 1.2 mg/dL - 0.6 -  Alkaline Phos 38 - 126 U/L - 120 -  AST 15 - 41 U/L - 30 -  ALT 0 - 44 U/L - 36 -       DIAGNOSTIC IMAGING:  I have independently reviewed the scans and discussed with the patient.     ASSESSMENT & PLAN:   IgA monoclonal gammopathy 1.  IgA lambda plasma cell myeloma: -SPEP on 06/02/2019 shows 2.8 g of M spike.  Kappa light chain 16.1, lambda light chains 140.4, ratio 0.11.  Beta-2 microglobulin 3.8.  LDH was normal.  Creatinine and calcium are normal. - Skeletal survey on 06/02/2019 showed old L1 compression fracture.  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  No evidence of lytic lesions. -24-hour urine shows total protein increased at 190 mg.  Bence-Jones protein positive, lambda type.  Immunofixation was positive. -Bone density test on 07/21/2019 shows T score of -2.2, osteopenic range. -Bone marrow biopsy on  06/22/2019 shows 50% lambda restricted plasma cells.  Hypercellular marrow with trilineage hematopoiesis.  Chromosome analysis was 58, XX.  Myeloma FISH panel was normal. -Recently she had worsening back pain.  Dr. Karie Kirks has ordered MRI of the spine at an outpatient facility in [place].  Unfortunately I do not have the results of it.  We will request for the results. -She has L1 compression fracture.  Her DEXA scan showed osteopenia.  It is unlikely that osteopenia has caused pathological compression fracture.  I have offered to do a PET scan.  She is reluctant to consider it because of high cost. -I think that she has multiple myeloma causing compression fracture even though she did not have any lytic lesions on the x-rays.  We talked about normal prognosis and treatment of plasma cell myeloma. -We will check her BMP today to make sure she is not developing renal failure or hypercalcemia. -We have talked about RVD regimen for induction.  We have given information about it.  We discussed side effects. -I will see her back next week for follow-up.  2.  Back pain: -She has severe back pain, now moved to the left lower back.  She is currently taking hydrocodone 10/325 3 times a day.  She is also taking ibuprofen 400 mg 3 times a day. -Pain is poorly controlled.  I have told her to increase hydrocodone 10/325 to 4 times a day.  If it is not helping, will consider changing it to oxycodone.  3.  Breast cancer: -Patient had a history  of right breast cancer in 2000 and 02/2006.  She was reportedly treated with chemotherapy followed by 1 year of Herceptin.  4.  Family history: -Brother had colon cancer.  Another brother had pancreatic cancer.  Father had stomach cancer.  No family history of multiple myeloma.     Total time spent is 40 minutes with more than 50% of the time spent face-to-face discussing biopsy results, further plan, counseling and coordination of care.  Orders placed this  encounter:  Orders Placed This Encounter  Procedures  . Basic metabolic panel  . Uric acid      Derek Jack, MD [place] [phone]

## 2019-07-23 NOTE — Progress Notes (Signed)
I met with patient following the visit with Dr. Delton Coombes today.  I explained the regimen, Revlimid, Velcade, Dexamethasone to her and the general overview of her disease/diagnosis.  She verbalizes understanding.  She knows that she will not get her medication until after her next follow up with [place] but she is requesting a calendar.  I told her to call me when she gets her medication and I will write a calendar for her and mail it out.  She verbalizes understanding.  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

## 2019-07-23 NOTE — Assessment & Plan Note (Signed)
1.  IgA lambda plasma cell myeloma: -SPEP on 06/02/2019 shows 2.8 g of M spike.  Kappa light chain 16.1, lambda light chains 140.4, ratio 0.11.  Beta-2 microglobulin 3.8.  LDH was normal.  Creatinine and calcium are normal. - Skeletal survey on 06/02/2019 showed old L1 compression fracture.  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  No evidence of lytic lesions. -24-hour urine shows total protein increased at 190 mg.  Bence-Jones protein positive, lambda type.  Immunofixation was positive. -Bone density test on 07/21/2019 shows T score of -2.2, osteopenic range. -Bone marrow biopsy on 06/22/2019 shows 50% lambda restricted plasma cells.  Hypercellular marrow with trilineage hematopoiesis.  Chromosome analysis was 32, XX.  Myeloma FISH panel was normal. -Recently she had worsening back pain.  Dr. Karie Kirks has ordered MRI of the spine at an outpatient facility in [place].  Unfortunately I do not have the results of it.  We will request for the results. -She has L1 compression fracture.  Her DEXA scan showed osteopenia.  It is unlikely that osteopenia has caused pathological compression fracture.  I have offered to do a PET scan.  She is reluctant to consider it because of high cost. -I think that she has multiple myeloma causing compression fracture even though she did not have any lytic lesions on the x-rays.  We talked about normal prognosis and treatment of plasma cell myeloma. -We will check her BMP today to make sure she is not developing renal failure or hypercalcemia. -We have talked about RVD regimen for induction.  We have given information about it.  We discussed side effects. -I will see her back next week for follow-up.  2.  Back pain: -She has severe back pain, now moved to the left lower back.  She is currently taking hydrocodone 10/325 3 times a day.  She is also taking ibuprofen 400 mg 3 times a day. -Pain is poorly controlled.  I have told her to increase hydrocodone 10/325  to 4 times a day.  If it is not helping, will consider changing it to oxycodone.  3.  Breast cancer: -Patient had a history of right breast cancer in 2000 and 02/2006.  She was reportedly treated with chemotherapy followed by 1 year of Herceptin.  4.  Family history: -Brother had colon cancer.  Another brother had pancreatic cancer.  Father had stomach cancer.  No family history of multiple myeloma.

## 2019-07-23 NOTE — Patient Instructions (Addendum)
[place] at [facility] Discharge Instructions  You were seen today by Dr. Delton Coombes. He went over your recent biopsy results. The results show that you have Multiple Myeloma and will need treatment. He will schedule you for a bone scan. He will also get blood drawn today. He will see you back after the bone survey for follow up.   Thank you for choosing [place] at [facility] to provide your oncology and hematology care.  To afford each patient quality time with our provider, please arrive at least 15 minutes before your scheduled appointment time.   If you have a lab appointment with the [facility] please come in thru the  Main Entrance and check in at the main information desk  You need to re-schedule your appointment should you arrive 10 or more minutes late.  We strive to give you quality time with our providers, and arriving late affects you and other patients whose appointments are after yours.  Also, if you no show three or more times for appointments you may be dismissed from the clinic at the providers discretion.     Again, thank you for choosing [facility].  Our hope is that these requests will decrease the amount of time that you wait before being seen by our physicians.       _____________________________________________________________  Should you have questions after your visit to [facility], please contact our office at [phone] between the hours of 8:00 a.m. and 4:30 p.m.  Voicemails left after 4:00 p.m. will not be returned until the following business day.  For prescription refill requests, have your pharmacy contact our office and allow 72 hours.    Cancer Center Support Programs:   > Cancer Support Group  2nd Tuesday of the month 1pm-2pm, Journey Room

## 2019-07-28 ENCOUNTER — Other Ambulatory Visit: Payer: Self-pay

## 2019-07-29 ENCOUNTER — Encounter (HOSPITAL_COMMUNITY): Payer: Self-pay | Admitting: Hematology

## 2019-07-29 ENCOUNTER — Inpatient Hospital Stay (HOSPITAL_BASED_OUTPATIENT_CLINIC_OR_DEPARTMENT_OTHER): Payer: BC Managed Care – PPO | Admitting: Hematology

## 2019-07-29 DIAGNOSIS — D472 Monoclonal gammopathy: Secondary | ICD-10-CM

## 2019-07-29 MED ORDER — FUROSEMIDE 20 MG PO TABS: 20.0000 mg | ORAL_TABLET | Freq: Every day | ORAL | 0 refills | Status: DC | PRN

## 2019-07-29 NOTE — Patient Instructions (Addendum)
[place] at [facility] Discharge Instructions  You were seen today by Dr. Delton Coombes. He went over your recent lab results. He will start you on Velcade, Revlimid and Dexamethasone. We will monitor your labs monthly. He will refer you to a transplant doctor to discuss a bone marrow transplant later on down the road. You are considered to have stage 2 myeloma, which means you will likely respond well to treatment. He will send you to [facility] for a second opinion.    Thank you for choosing [place] at [facility] to provide your oncology and hematology care.  To afford each patient quality time with our provider, please arrive at least 15 minutes before your scheduled appointment time.   If you have a lab appointment with the [facility] please come in thru the  Main Entrance and check in at the main information desk  You need to re-schedule your appointment should you arrive 10 or more minutes late.  We strive to give you quality time with our providers, and arriving late affects you and other patients whose appointments are after yours.  Also, if you no show three or more times for appointments you may be dismissed from the clinic at the providers discretion.     Again, thank you for choosing [facility].  Our hope is that these requests will decrease the amount of time that you wait before being seen by our physicians.       _____________________________________________________________  Should you have questions after your visit to [facility], please contact our office at [phone] between the hours of 8:00 a.m. and 4:30 p.m.  Voicemails left after 4:00 p.m. will not be returned until the following business day.  For prescription refill requests, have your pharmacy contact our office and allow 72 hours.    Cancer Center Support Programs:   > Cancer Support Group  2nd Tuesday of the month 1pm-2pm, Journey Room

## 2019-07-29 NOTE — Progress Notes (Signed)
[place], [place] [address]   CLINIC:  Medical Oncology/Hematology  PCP:  [name], MD [place] [id number] [phone]   REASON FOR VISIT:  Follow-up for monoclonal gammopathy.  CURRENT THERAPY: Recommended RVD.    INTERVAL HISTORY:  Stephanie Galvan 63 y.o. female seen for follow-up of plasma cell disorder.  Stephanie Galvan comes with reports of the thoracic and lumbar spine MRI.  Denies any fevers, night sweats.  However has severe back pain, rated as 8 out of 10.  Stephanie Galvan is taking hydrocodone 10 mg 4 times a day.  Reports ankle swellings.  Denies any recent infections or hospitalizations.    REVIEW OF SYSTEMS:  Review of Systems  Cardiovascular: Positive for leg swelling.  Musculoskeletal: Positive for back pain.  All other systems reviewed and are negative.    PAST MEDICAL/SURGICAL HISTORY:  Past Medical History:  Diagnosis Date  . Breast cancer ([facility]) 2006   left breast  . Breast mass, right   . Compression fracture of cervical spine ([facility]) 06/18/2019   recent Chest xray stating had fx.  . High cholesterol   . History of bladder infections   . Hypertension   . Rash Aug 2013   Past Surgical History:  Procedure Laterality Date  . ABDOMINAL HYSTERECTOMY     just uterus removed  . BREAST BIOPSY    . BREAST LUMPECTOMY WITH RADIOACTIVE SEED LOCALIZATION Right 07/24/2018   Procedure: RIGHT BREAST LUMPECTOMY WITH RADIOACTIVE SEED LOCALIZATION;  Surgeon: Coralie Keens, MD;  Location: [facility];  Service: General;  Laterality: Right;  . MASTECTOMY  5/06   left  . PARTIAL HYSTERECTOMY    . PORT-A-CATH REMOVAL    . PORTACATH PLACEMENT       SOCIAL HISTORY:  Social History   Socioeconomic History  . Marital status: Married    Spouse name: Not on file  . Number of children: 2  . Years of education: Not on file  . Highest education level: Not on file  Occupational History    Employer: [employer]  . Financial resource strain: Not on file  . Food insecurity    Worry: Not on file    Inability: Not on file  . Transportation needs    Medical: Not on file    Non-medical: Not on file  Tobacco Use  . Smoking status: Never Smoker  . Smokeless tobacco: Never Used  Substance and Sexual Activity  . Alcohol use: No  . Drug use: No  . Sexual activity: Yes    Birth control/protection: Surgical  Lifestyle  . Physical activity    Days per week: Not on file    Minutes per session: Not on file  . Stress: Not on file  Relationships  . Social Herbalist on phone: Not on file    Gets together: Not on file    Attends religious service: Not on file    Active member of club or organization: Not on file    Attends meetings of clubs or organizations: Not on file    Relationship status: Not on file  . Intimate partner violence    Fear of current or ex partner: Not on file    Emotionally abused: Not on file    Physically abused: Not on file    Forced sexual activity: Not on file  Other Topics Concern  . Not on file  Social History Narrative  .  Not on file    FAMILY HISTORY:  Family History  Problem Relation Age of Onset  . Dementia Mother   . Stomach cancer Father   . Cancer Brother   . Cancer Brother   . Diabetes Brother   . Colitis Daughter   . Hypertension Daughter   . Hypertension Daughter     CURRENT MEDICATIONS:  Outpatient Encounter Medications as of 07/29/2019  Medication Sig  . atenolol (TENORMIN) 50 MG tablet Take 50 mg by mouth daily.    . hydrochlorothiazide 25 MG tablet Take 25 mg by mouth daily.    Marland Kitchen HYDROcodone-acetaminophen (NORCO) 10-325 MG tablet ORAL TAKE 1 TABLET UP TO 3 TIMES A DAY FOR SEVERE PAIN.  Marland Kitchen ibuprofen (ADVIL) 800 MG tablet Take 800 mg by mouth 3 (three) times daily.  . potassium chloride (K-DUR) 10 MEQ tablet Take 10 mEq by mouth daily.  . Aspirin-Salicylamide-Caffeine (BC HEADACHE POWDER PO) Take 1 tablet by  mouth as needed.   . furosemide (LASIX) 20 MG tablet Take 1 tablet (20 mg total) by mouth daily as needed.  . [DISCONTINUED] ibuprofen (ADVIL) 800 MG tablet Take 800 mg by mouth 3 (three) times daily. Pt sometimes takes a half tablet   No facility-administered encounter medications on file as of 07/29/2019.     ALLERGIES:  No Known Allergies   PHYSICAL EXAM:  ECOG Performance status: 1  Vitals:   07/29/19 1453  BP: (!) 143/77  Pulse: 78  Resp: 14  Temp: (!) 97.3 F (36.3 C)  SpO2: 98%   Filed Weights   07/29/19 1453  Weight: 186 lb (84.4 kg)    Physical Exam Vitals signs reviewed.  Constitutional:      Appearance: Normal appearance.  Cardiovascular:     Rate and Rhythm: Normal rate and regular rhythm.     Heart sounds: Normal heart sounds.  Pulmonary:     Effort: Pulmonary effort is normal.     Breath sounds: Normal breath sounds.  Abdominal:     General: There is no distension.     Palpations: Abdomen is soft. There is no mass.  Musculoskeletal:     Right lower leg: Edema present.     Left lower leg: Edema present.  Skin:    General: Skin is warm.  Neurological:     General: No focal deficit present.     Mental Status: Stephanie Galvan is alert and oriented to person, place, and time.  Psychiatric:        Mood and Affect: Mood normal.        Behavior: Behavior normal.      LABORATORY DATA:  I have reviewed the labs as listed.  CBC    Component Value Date/Time   WBC 5.3 06/22/2019 0745   RBC 3.80 (L) 06/22/2019 0745   HGB 11.2 (L) 06/22/2019 0745   HCT 36.0 06/22/2019 0745   PLT 326 06/22/2019 0745   MCV 94.7 06/22/2019 0745   MCH 29.5 06/22/2019 0745   MCHC 31.1 06/22/2019 0745   RDW 13.0 06/22/2019 0745   LYMPHSABS 1.4 06/22/2019 0745   MONOABS 0.6 06/22/2019 0745   EOSABS 0.2 06/22/2019 0745   BASOSABS 0.0 06/22/2019 0745   CMP Latest Ref Rng & Units 07/23/2019 06/02/2019 07/17/2018  Glucose 70 - 99 mg/dL 95 103(H) 104(H)  BUN 8 - 23 mg/dL _0 Creatinine 0.44 - 1.00 mg/dL 0.79 0.78 0.79  Sodium 135 - 145 mmol/L 139 138 137  Potassium 3.5 - 5.1 mmol/L 3.3(L)  3.6 3.3(L)  Chloride 98 - 111 mmol/L 101 100 99  CO2 22 - 32 mmol/L _0 Calcium 8.9 - 10.3 mg/dL 9.7 9.9 9.2  Total Protein 6.5 - 8.1 g/dL - 9.8(H) -  Total Bilirubin 0.3 - 1.2 mg/dL - 0.6 -  Alkaline Phos 38 - 126 U/L - 120 -  AST 15 - 41 U/L - 30 -  ALT 0 - 44 U/L - 36 -       DIAGNOSTIC IMAGING:  I have independently reviewed the scans and discussed with the patient.     ASSESSMENT & PLAN:   IgA monoclonal gammopathy 1.  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  Kappa light chain 16.1, lambda light chains 140.4, ratio 0.11.  Beta-2 microglobulin 3.8.  LDH was normal.  Creatinine and calcium are normal. - Skeletal survey on 06/02/2019 showed old L1 compression fracture.  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  No evidence of lytic lesions. -24-hour urine shows total protein increased at 190 mg.  Bence-Jones protein positive, lambda type.  Immunofixation was positive. -Bone density test on 07/21/2019 shows T score of -2.2, osteopenic range. -Bone marrow biopsy on 06/22/2019 shows 50% lambda restricted plasma cells.  Hypercellular marrow with trilineage hematopoiesis.  Chromosome analysis was 45, XX.  Myeloma FISH panel was normal. -Recently had worsening back pain.  MRI of the thoracic spine showed acute to subacute benign appearing compression fractures of T8 and T10 with mild to moderate loss of height.  Chronic T12 compression fracture.  MRI of the lumbar spine on 06/25/2019 showed acute to subacute L1 and L3 compression fractures with up to 50% central loss of the height. -Bone density test on 07/21/2019 shows T score of -2.2 consistent with osteopenia. -I strongly think Stephanie Galvan has multiple myeloma causing these fractures rather than osteopenia. -I have recommended induction therapy with RVD  regimen.  Today I had prolonged discussion with the patient and her 2 daughters about diagnosis, prognosis and treatment. -As per the daughter's request, we will get second opinion.  I will make a referral to Dr. Doristine Devoid at [facility].  2.  Back pain: -Stephanie Galvan has severe back pain.  Currently taking hydrocodone 10/325 4 times a day.  3.  Breast cancer: -Patient had a history of right breast cancer in 2000 and 02/2006.  Stephanie Galvan was reportedly treated with chemotherapy followed by 1 year of Herceptin.  4.  Family history: -Brother had colon cancer.  Another brother had pancreatic cancer.  Father had stomach cancer.  No family history of multiple myeloma.  5.  Leg swellings: -Likely from hypoalbuminemia.  I will start her on Lasix 20 mg as needed.    Total time spent is 25 minutes with more than 50% of the time spent face-to-face discussing diagnosis, prognosis, treatment plan, counseling and coordination of care.  Orders placed this encounter:  No orders of the defined types were placed in this encounter.     Derek Jack, MD [place] [phone]

## 2019-07-29 NOTE — Assessment & Plan Note (Addendum)
1.  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  Kappa light chain 16.1, lambda light chains 140.4, ratio 0.11.  Beta-2 microglobulin 3.8.  LDH was normal.  Creatinine and calcium are normal. - Skeletal survey on 06/02/2019 showed old L1 compression fracture.  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  No evidence of lytic lesions. -24-hour urine shows total protein increased at 190 mg.  Bence-Jones protein positive, lambda type.  Immunofixation was positive. -Bone density test on 07/21/2019 shows T score of -2.2, osteopenic range. -Bone marrow biopsy on 06/22/2019 shows 50% lambda restricted plasma cells.  Hypercellular marrow with trilineage hematopoiesis.  Chromosome analysis was 39, XX.  Myeloma FISH panel was normal. -Recently had worsening back pain.  MRI of the thoracic spine showed acute to subacute benign appearing compression fractures of T8 and T10 with mild to moderate loss of height.  Chronic T12 compression fracture.  MRI of the lumbar spine on 06/25/2019 showed acute to subacute L1 and L3 compression fractures with up to 50% central loss of the height. -Bone density test on 07/21/2019 shows T score of -2.2 consistent with osteopenia. -I strongly think she has multiple myeloma causing these fractures rather than osteopenia. -I have recommended induction therapy with RVD regimen.  Today I had prolonged discussion with the patient and her 2 daughters about diagnosis, prognosis and treatment. -As per the daughter's request, we will get second opinion.  I will make a referral to Dr. Doristine [facility]oid at [facility].  2.  Back pain: -She has severe back pain.  Currently taking hydrocodone 10/325 4 times a day.  3.  Breast cancer: -Patient had a history of right breast cancer in 2000 and 02/2006.  She was reportedly treated with chemotherapy followed by 1 year of Herceptin.  4.  Family history: -Brother had colon cancer.  Another brother had  pancreatic cancer.  Father had stomach cancer.  No family history of multiple myeloma.  5.  Leg swellings: -Likely from hypoalbuminemia.  I will start her on Lasix 20 mg as needed.

## 2019-07-30 ENCOUNTER — Encounter (HOSPITAL_COMMUNITY): Payer: Self-pay | Admitting: Lab

## 2019-07-30 NOTE — Progress Notes (Unsigned)
Referral to [facility] Dr Samule Ohm for 2nd opinion.  Referral sent on 11/19

## 2019-08-05 ENCOUNTER — Encounter (HOSPITAL_COMMUNITY): Payer: Self-pay | Admitting: *Deleted

## 2019-08-05 NOTE — Progress Notes (Signed)
Patient called clinic today stating that her legs and feet have been swelling.  She describes it as minimal.  She has been taking the fluid pill as prescribed.  I spoke with Dr. Delton Coombes who gave orders for her to take the lasix twice daily to see if that helps her.  He wants her to try this for the next week.  I advised patient to keep legs elevated, try compression hose and take medication as ordered.  She will let me know next week how her legs are doing.

## 2019-08-10 ENCOUNTER — Encounter (HOSPITAL_COMMUNITY): Payer: Self-pay | Admitting: *Deleted

## 2019-08-10 NOTE — Progress Notes (Signed)
Patient called the clinic today.  She reports that she still wants a second opinion with [facility] but she wants to start with treatment here.  I went over her treatment plan as Dr. Delton Coombes had explained to her before.  I got her scheduled for treatment next week.    I advised Dr. Delton Coombes that she wanted to start treatment so that he could get her medications ordered.

## 2019-08-12 ENCOUNTER — Encounter (HOSPITAL_COMMUNITY): Payer: Self-pay | Admitting: *Deleted

## 2019-08-12 ENCOUNTER — Other Ambulatory Visit (HOSPITAL_COMMUNITY): Payer: Self-pay | Admitting: *Deleted

## 2019-08-12 ENCOUNTER — Other Ambulatory Visit (HOSPITAL_COMMUNITY): Payer: Self-pay | Admitting: Hematology

## 2019-08-12 DIAGNOSIS — Z7189 Other specified counseling: Secondary | ICD-10-CM | POA: Insufficient documentation

## 2019-08-12 DIAGNOSIS — D472 Monoclonal gammopathy: Secondary | ICD-10-CM

## 2019-08-12 MED ORDER — ACYCLOVIR 400 MG PO TABS: 400.0000 mg | ORAL_TABLET | Freq: Two times a day (BID) | ORAL | 3 refills | Status: DC

## 2019-08-12 MED ORDER — DEXAMETHASONE 4 MG PO TABS: ORAL_TABLET | ORAL | 3 refills | Status: DC

## 2019-08-12 MED ORDER — LENALIDOMIDE 25 MG PO CAPS: ORAL_CAPSULE | ORAL | 0 refills | Status: DC

## 2019-08-12 MED ORDER — LENALIDOMIDE 25 MG PO CAPS: ORAL_CAPSULE | ORAL | 3 refills | Status: DC

## 2019-08-12 NOTE — Progress Notes (Signed)
START ON PATHWAY REGIMEN - Multiple Myeloma and Other Plasma Cell Dyscrasias     A cycle is every 21 days:     Bortezomib      Lenalidomide      Dexamethasone   **Always confirm dose/schedule in your pharmacy ordering system**  Patient Characteristics: Newly Diagnosed, Transplant Eligible, Standard Risk R-ISS Staging: II Disease Classification: Newly Diagnosed Is Patient Eligible for Transplant<= Transplant Eligible Risk Status: Standard Risk Intent of Therapy: Non-Curative / Palliative Intent, Discussed with Patient 

## 2019-08-17 ENCOUNTER — Encounter (HOSPITAL_COMMUNITY): Payer: Self-pay | Admitting: *Deleted

## 2019-08-17 NOTE — Progress Notes (Signed)
We received approval through covermymeds that prior authorization is complete.  I spoke with Stephanie Galvan with [facility] and they are working to get her medication filled and out to her by tomorrow or Wednesday.

## 2019-08-18 NOTE — Patient Instructions (Signed)
Hopkins will see the doctor regularly throughout treatment.  We will obtain blood work from you prior to every treatment and monitor your results to make sure it is safe to give your treatment. The doctor monitors your response to treatment by the way you are feeling, your blood work, and by obtaining scans periodically.  There will be wait times while you are here for treatment.  It will take about 30 minutes to 1 hour for your lab work to result.  Then there will be wait times while pharmacy mixes your medications.

## 2019-08-18 NOTE — Patient Instructions (Signed)
[facility] Chemotherapy Teaching   You are diagnosed with Stage II (standard risk) IgA lambda plasma cell myeloma.  You will be treated weekly in the clinic with an injection called bortezomib (Velcade). You will also be taking an oral chemotherapy medication at home called lenalidomide (Revlimid), as well as a steroid, dexamethasone (Decadron).  Take the Revlimid on days 1-14 (two weeks on, one week off) of each cycle.  The Decadron should be taken weekly on the days you receive your Velcade injection.  The intent of treatment is to control your myeloma and alleviate any symptoms you may have as a result of your disease.  You will see the doctor regularly throughout treatment.  We will obtain blood work from you prior to every treatment and monitor your results to make sure it is safe to give your treatment. The doctor monitors your response to treatment by the way you are feeling, your blood work, and by obtaining scans periodically.  There will be wait times while you are here for treatment.  It will take about 30 minutes to 1 hour for your lab work to result.  Then there will be wait times while pharmacy mixes your medications.   Medications that you will take prior to receiving the Velcade injection:  Compazine: nausea medication to prevent chemotherapy induced nausea   Bortezomib (Velcade)  About This Drug Bortezomib is used to treat cancer. It is given in the vein (IV) or by a shot under the skin (subcutaneously).  You will receive this injection under your skin.   Possible Side Effects . Bone marrow suppression. Decrease in the number of white blood cells, red blood cells, and platelets. This may raise your risk of infection, make you tired and weak (fatigue), and raise your risk of bleeding. . Nausea and vomiting (throwing up) . Constipation (not able to move bowels) . Diarrhea (loose bowel movements) . Fever . Tiredness . Decreased appetite (decreased hunger) .  Effects on the nerves are called peripheral neuropathy. You may feel numbness, tingling, or pain in your hands and feet. It may be hard for you to button your clothes, open jars, or walk as usual. The effect on the nerves may get worse with more doses of the drug. These effects get better in some people after the drug is stopped but it does not get better in all people. . Rash  Note: Each of the side effects above was reported in 20% or greater of patients treated with bortezomib. Not all possible side effects are included above.  Warnings and Precautions . Severe peripheral neuropathy . Low blood pressure . Congestive heart failure - your heart has less ability to pump blood properly. . Trouble breathing because of fluid build-up and/or inflammation in your lungs . Nausea, vomiting, diarrhea and constipation which sometimes requires treatment to help lessen these side effects. There is also an increased risk of developing a partial or complete blockage of your small and/or large intestine. . Changes in your central nervous system can happen. The central nervous system is made up of your brain and spinal cord. You could feel extreme tiredness, agitation, confusion, have hallucinations (see or hear things that are not there), trouble understanding or speaking, loss of control of your bowels or bladder, eyesight changes, numbness or lack of strength to your arms, legs, face, or body, seizures or coma. If you start to have any of these symptoms let your doctor know right away. . Tumor lysis syndrome: This drug may  act on the cancer cells very quickly. This may affect how your kidneys work. . Changes in your liver function Increased risk of a syndrome that affects your red blood cells, platelets and blood vessels in your kidneys, which can cause kidney failure and be life-threatening.  Important Information . This drug may be present in the saliva, tears, sweat, urine, stool, vomit, semen,  and vaginal secretions. Talk to your doctor and/or your nurse about the necessary precautions to take during this time. . This drug may impair your ability to drive or use machinery. Use caution and tell your nurse or doctor if you feel dizzy, very sleepy, and/or experience low blood pressure.  Treating Side Effects . Manage tiredness by pacing your activities for the day. . Be sure to include periods of rest between energy-draining activities. . To decrease the risk of infection, wash your hands regularly. . Avoid close contact with people who have a cold, the flu, or other infections. . Take your temperature as your doctor or nurse tells you, and whenever you feel like you may have a fever. . To help decrease the risk of bleeding, use a soft toothbrush. Check with your nurse before using dental floss. . Be very careful when using knives or tools. . Use an electric shaver instead of a razor. . Ask your doctor or nurse about medicines that are available to help stop or lessen constipation. . If you are not able to move your bowels, check with your doctor or nurse before you use enemas, laxatives, or suppositories. . Drink plenty of fluids (a minimum of eight glasses per day is recommended). . If you throw up or have loose bowel movements, you should drink more fluids so that you do not become dehydrated (lack of water in the body from losing too much fluid). . If you have diarrhea, eat low-fiber foods that are high in protein and calories and avoid foods that can irritate your digestive tracts or lead to cramping. . Ask your nurse or doctor about medicine that can lessen or stop your diarrhea. . To help with nausea and vomiting, eat small, frequent meals instead of three large meals a day. Choose foods and drinks that are at room temperature. Ask your nurse or doctor about other helpful tips and medicine that is available to help stop or lessen these symptoms. . To help with decreased  appetite, eat foods high in calories and protein, such as meat, poultry, fish, dry beans, tofu, eggs, nuts, milk, yogurt, cheese, ice cream, pudding, and nutritional supplements. . Consider using sauces and spices to increase taste. Daily exercise, with your doctor's approval, may increase your appetite. . If you have numbness and tingling in your hands and feet, be careful when cooking, walking, and handling sharp objects and hot liquids. . If you get a rash do not put anything on it unless your doctor or nurse says you may. Keep the area around the rash clean and dry. Ask your doctor for medicine if your rash bothers you.  Food and Drug Interactions . This drug may interact with grapefruit and grapefruit juice. Talk to your doctor as this could make side effects worse. . Check with your doctor or pharmacist about all other prescription medicines and over-the-counter medicines and dietary supplements (vitamins, minerals, herbs and others) you are taking before starting this medicine as there are known drug interactions with bortezomib. Also, check with your doctor or pharmacist before starting any new prescription or over-the-counter medicines, or dietary supplements  to make sure that there are no interactions. . Avoid the use of St. John's Wort with bortezomib as this may lower the levels of the drug in your body, which can make it less effective.  When to Call the Doctor Call your doctor or nurse if you have any of these symptoms and/or any new or unusual symptoms: . Fever of 100.4 F (38 C) or higher . Chills . Tiredness that interferes with your daily activities . Feeling dizzy or lightheaded . Feeling that your heart is beating in a fast or not normal way (palpitations) . Cough . Wheezing or trouble breathing . Easy bleeding or bruising . Confusion and/or agitation . Hallucinations . Trouble understanding or speaking . Blurry vision or changes in your eyesight . Numbness or  lack of strength to your arms, legs, face, or body . Symptoms of a seizure such as confusion, blacking out, passing out, loss of hearing or vision, blurred vision, unusual smells or tastes (such as burning rubber), trouble talking, tremors or shaking in parts or all of the body, repeated body movements, tense muscles that do not relax, and loss of control of urine and bowels. If you or your family member suspects you are having a seizure, call 911 right away. . Nausea that stops you from eating or drinking and/or is not relieved by prescribed medicines . Throwing up more than 3 times a day . Lasting loss of appetite or rapid weight loss of five pounds in a week . No bowel movement in 3 days or when you feel uncomfortable. . Abdominal pain that does not go away . Diarrhea, 4 times in one day or diarrhea with lack of strength or a feeling of being dizzy . Numbness, tingling, or pain your hands and feet . Swelling of legs, ankles, and/or feet . Weight gain of 5 pounds in one week (fluid retention) . Decreased urine, or very dark urine . New rash and/or itching . Rash that is not relieved by prescribed medicines . Signs of tumor lysis: Confusion or agitation, decreased urine, nausea/vomiting, diarrhea, muscle cramping, numbness and/or tingling, seizures. . Signs of possible liver problems: dark urine, pale bowel movements, bad stomach pain, feeling very tired and weak, unusual itching, or yellowing of the eyes or skin . If you think you are pregnant or may have impregnated your partner  Reproduction Warnings . Pregnancy warning: This drug can have harmful effects on the unborn baby. Women of child bearing potential should use effective methods of birth control during your cancer treatment and for at least 7 months after treatment. Men with female partners of childbearing potential should use effective methods of birth control during your cancer treatment and for at least 4 months after  your cancer treatment. Let your doctor know right away if you think you may be pregnant or may have impregnated your partner. . Breastfeeding warning: Women should not breastfeed during treatment and for 2 months month after treatment because this drug could enter the breast milk and cause harm to a breastfeeding baby. . Fertility warning: In men and women both, this drug may affect your ability to have children in the future. Talk with your doctor or nurse if you plan to have children. Ask for information on sperm or egg banking.  Dexamethasone (Decadron)  About This Drug Dexamethasone is used to treat cancer, to decrease inflammation and sometimes used before and after chemotherapy to prevent or treat nausea and/or vomiting. It is given in the vein (IV) or orally (  by mouth).  Possible Side Effects . Headache . High blood pressure . Abnormal heart beat . Tiredness and weakness . Changes in mood, which may include depression or a feeling of extreme well-being . Trouble sleeping . Increased sweating . Increased appetite (increased hunger) . Weight gain . Increase risk of infections . Pain in your abdomen . Nausea . Skin changes such as rash, dryness, redness . Blood sugar levels may change . Electrolyte changes . Swelling of your legs, ankles and/or feet . Changes in your liver function . You may be at risk for cataracts, glaucoma or infections of the eye . Muscle loss and / or weakness (lack of muscle strength) . Increased risk of developing osteoporosis- your bones may become weak and brittle  Note: Not all possible side effects are included above.  Warnings and Precautions . This drug may cause you to feel irritable, nervous or restless. . Allergic reactions, including anaphylaxis are rare but may happen in some patients. Signs of allergic reaction to this drug may be swelling of the face, feeling like your tongue or throat are swelling, trouble breathing, rash,  itching, fever, chills, feeling dizzy, and/or feeling that your heart is beating in a fast or not normal way. If this happens, do not take another dose of this drug. You should get urgent medical treatment. . High blood pressure and changes in electrolytes, which can cause fluid build-up around your heart, lungs or elsewhere. . Increased risk of developing a hole in your stomach, small, and/or large intestine if you have ulcers in the lining of your stomach and/or intestine, or have diverticulitis, ulcerative colitis and/or other diseases that affect the gastrointestinal tract. . Effects on the endocrine glands including the pituitary, adrenals or thyroid during or after use of this medication. . Changes in the tissue of the heart, that can cause your heart to have less ability to pump blood. You may be short of breath or our arms, hands, legs and feet may swell. . Increased risk of heart attack. . Severe depression and other psychiatric disorders such as mood changes. . Burning, pain and itching around your anus may happen when this drug is given in the vein too rapidly (IV). It usually happens suddenly and resolves in less than 1 minute.  Important Information . Talk to your doctor or your nurse before stopping this medication, it should be stopped gradually. Depending on the dose and length of treatment, you could experience serious side effects if stopped abruptly (suddenly). . Talk to your doctor before receiving any vaccinations during your treatment. Some vaccinations are not recommended while receiving dexamethasone.  How to Take Your Medication . For Oral (by mouth): You can take the medicine with or without food. If you have nausea or upset stomach, take it with food. . Missed dose: If you miss a dose, do not take 2 doses at the same time or extra doses. . If you vomit a dose, take your next dose at the regular time. Do not take 2 doses at the same time . Handling: Wash your  hands after handling your medicine, your caretakers should not handle your medicine with bare hands and should wear latex gloves. . Storage: Store this medicine in the original container at room temperature. Protect from moisture and light. Discuss with your nurse or your doctor how to dispose of unused medicine.  Treating Side Effects . Drink plenty of fluids (a minimum of eight glasses per day is recommended). . To help with nausea  and vomiting, eat small, frequent meals instead of three large meals a day. Choose foods and drinks that are at room temperature. Ask your nurse or doctor about other helpful tips and medicine that is available to help stop or lessen these symptoms. . If you throw up, you should drink more fluids so that you do not become dehydrated (lack of water in the body from losing too much fluid). . Manage tiredness by pacing your activities for the day. . Be sure to include periods of rest between energy-draining activities. . To help with muscle weakness, get regular exercise. If you feel too tired to exercise vigorously, try taking a short walk. . If you are having trouble sleeping, talk to your nurse or doctor on tips to help you sleep better. . If you are feeling depressed, talk to your nurse or doctor about it. Marland Kitchen Keeping your pain under control is important to your well-being. Please tell your doctor or nurse if you are experiencing pain. . If you have diabetes, keep good control of your blood sugar level. Tell your nurse or your doctor if your glucose levels are higher or lower than normal. . To decrease the risk of infection, wash your hands regularly. . Avoid close contact with people who have a cold, the flu, or other infections. . Take your temperature as your doctor or nurse tells you, and whenever you feel like you may have a fever. . If you get a rash do not put anything on it unless your doctor or nurse says you may. Keep the area around the rash clean  and dry. Ask your doctor for medicine if your rash bothers you. . Moisturize your skin several times day. . Avoid sun exposure and apply sunscreen routinely when outdoors.  Food and Drug Interactions . There are no known interactions of dexamethasone with food. . Check with your doctor or pharmacist about all other prescription medicines and over-the-counter medicines and dietary supplements (vitamins, minerals, herbs and others) you are taking before starting this medicine as there are known drug interactions with dexamethasone. Also, check with your doctor or pharmacist before starting any new prescription or over-the-counter medicines, or dietary supplement to make sure that there are no interactions. . There are known interactions of dexamethasone with other medicines and products like acetaminophen, aspirin, and ibuprofen. Ask your doctor what over-the-counter (OTC) medicines you can take.  When to Call the Doctor Call your doctor or nurse if you have any of these symptoms and/or any new or unusual symptoms: . Fever of 100.4 F (38 C) or higher . Chills . A headache that does not go away . Trouble breathing . Blurry vision or other changes in eyesight . Feel irritable, nervous or restless . Trouble falling or staying asleep . Severe mood changes such as depression or unusual thoughts and/or behaviors . Thoughts of hurting yourself or others, and suicide . Tiredness that interferes with your daily activities . Feeling that your heart is beating in a fast, slow or not normal way . Feeling dizzy or lightheaded . Chest pain or symptoms of a heart attack. Most heart attacks involve pain in the center of the chest that lasts more than a few minutes. The pain may go away and come back, or it can be constant. It can feel like pressure, squeezing, fullness, or pain. Sometimes pain is felt in one or both arms, the back, neck, jaw, or stomach. If any of these symptoms last 2 minutes, call  911. Marland Kitchen  Heartburn or indigestion . Nausea that stops you from eating or drinking and/or is not relieved by prescribed medicines . Throwing up more than 3 times a day . Pain in your abdomen that does not go away . Abnormal blood sugar . Unusual thirst, passing urine often, headache, sweating, shakiness, irritability . Swelling of legs, ankles, or feet . Weight gain of 5 pounds in one week (fluid retention) . Signs of possible liver problems: dark urine, pale bowel movements, bad stomach pain, feeling very tired and weak, unusual itching, or yellowing of the eyes or skin . Severe muscle weakness . A new rash or a rash that is not relieved by prescribed medicines . Signs of allergic reaction: swelling of the face, feeling like your tongue or throat are swelling, trouble breathing, rash, itching, fever, chills, feeling dizzy, and/or feeling that your heart is beating in a fast or not normal way. If this happens, call 911 for emergency care. . If you think you may be pregnant  Reproduction Warnings . Pregnancy warning: It is not known if this drug may harm an unborn child. For this reason, be sure to talk with your doctor if you are pregnant or planning to become pregnant while receiving this drug. Let your doctor know right away if you think you may be pregnant or may have impregnated your partner. . Breastfeeding warning: It is not known if this drug passes into breast milk. For this reason, women should talk to their doctor about the risks and benefits of breastfeeding during treatment with this drug because this drug may enter the breast milk and cause harm to a breastfeeding baby. . Fertility warning: Human fertility studies have not been done with this drug. Talk with your doctor or nurse if you plan to have children. Ask for information on sperm banking.  Lenalidomide (Revlimid)  About This Drug Lenalidomide is used to treat cancer. It is given orally (by mouth).  Possible Side  Effects . Bone marrow suppression. This is a decrease in the number of white blood cells, red blood cells, and platelets. This may raise your risk of infection, make you tired and weak (fatigue), and raise your risk of bleeding . Nausea . Diarrhea (loose bowel movements) . Constipation (unable to move bowels) . Inflammation of your stomach and/or intestines . Pain in your abdomen . Fever . Tiredness and weakness . Swelling of your legs, ankles and/or feet . Decreased appetite (decreased hunger) . Muscle cramps/spasms . Back pain . Pain in your joints . Headache . Feeling dizzy . Tremor . Trouble sleeping . Nosebleed . Upper respiratory infection, bronchitis . Inflammation of the nasal passages and throat . Trouble breathing . Cough . Rash and itching  Note: Each of the side effects above was reported in 15% or greater of patients treated with lenalidomide. Not all possible side effects are included above.  Warnings and Precautions . Blood clots and events such as stroke and heart attack. A blood clot in your leg may cause your leg to swell, appear red and warm, and/or cause pain. A blood clot in your lungs may cause trouble breathing, pain when breathing, and/or chest pain. . Severe bone marrow suppression. . Changes in your liver function, which may cause liver failure and be life-threatening. . Tumor lysis syndrome: This drug may act on the cancer cells very quickly. This may affect how your kidneys work and can be life-threatening. . Changes in your thyroid function . Severe allergic skin reaction which may be life-threatening.  You may develop blisters on your skin that are filled with fluid or a severe red rash all over your body that may be painful. . This drug may raise your risk of getting a second cancer. . You may develop a syndrome called tumor flare reaction. You may have painful lymph nodes, enlarged spleen, fever and a rash. . This drug may make it more  difficult to collect your stem cells if an autologous stem cell transplant is part of your treatment plan. . There is a rare increased risk of death in patients with chronic lymphocytic leukemia and a risk of early death (dying sooner) in patient with mantle cell lymphoma.  Note: Some of the side effects above are very rare. If you have concerns and/or questions, please discuss them with your medical team.  Important Information . You will need to sign up for a special program called Revlimid REMS when you start taking this drug. Your nurse will help you get started. . Do not donate blood during your treatment and for 4 weeks after your treatment. . Men should not donate sperm during your treatment and for 4 weeks after your treatment because this drug is present in semen and may badly harm a baby.  How to Take Your Medication . Swallow the medicine whole with water, with or without food. Do not chew, break, or open it. . Take this medicine at the same time each day . Missed dose: If you miss a dose, take it as soon as you think about it ONLY if it has been less than 12 hours since your regular time. If it has been more than 12 hours, skip the missed dose and contact your physician. Take your next dose at the regular time. Do not take 2 doses at the same time and do not double up on the next dose. . If you vomit a dose, take your next dose at the regular time. . Handling: Wash your hands after handling your medicine, your caretakers should not handle your medicine with bare hands and should wear latex gloves. . If you get any of the content of a broken capsules on your skin, you should wash the area of the skin well with soap and water right away. Call your doctor if you get a skin reaction. . This drug may be present in the saliva, tears, sweat, urine, stool, vomit, semen, and vaginal secretions. Talk to your doctor and/or your nurse about the necessary precautions to take during this  time. . Storage: Store this medicine in the original container at room temperature. . Disposal of unused medicine: Do not flush any expired and/or unused medicine down the toilet or drain unless you are specifically instructed to do so on the medication label. Some facilities have take-back programs and/or other options. If you do not have a take-back program in your area, then please discuss with your nurse or your doctor how to dispose of unused medicine.  Treating Side Effects . Manage tiredness by pacing your activities for the day. . Be sure to include periods of rest between energy-draining activities. . If you are dizzy, get up slowly after sitting or lying. . To decrease the risk of infection, wash your hands regularly. . Avoid close contact with people who have a cold, the flu, or other infections. . Take your temperature as your doctor or nurse tells you, and whenever you feel like you may have a fever. . To help decrease the risk of bleeding, use a  soft toothbrush. Check with your nurse before using dental floss. . Be very careful when using knives or tools. . Use an electric shaver instead of a razor. . Ask your doctor or nurse about medicines that are available to help stop or lessen constipation. . If you are not able to move your bowels, check with your doctor or nurse before you use enemas, laxatives, or suppositories. . Drink plenty of fluids (a minimum of eight glasses per day is recommended). . Drink fluids that contribute calories (whole milk, juice, soft drinks, sweetened beverages, milkshakes, and nutritional supplements) instead of water. . Include a source of protein at every meal and snack, such as meat, poultry, fish, dry beans, tofu, eggs, nuts, milk, yogurt, cheese, ice cream, pudding, and nutritional supplements. . If you throw up or have loose bowel movements, you should drink more fluids so that you do not become dehydrated (lack of water in the body from  losing too much fluid). . If you have diarrhea, eat low-fiber foods that are high in protein and calories and avoid foods that can irritate your digestive tracts or lead to cramping. . Ask your nurse or doctor about medicine that can lessen or stop your diarrhea. . To help with nausea and vomiting, eat small, frequent meals instead of three large meals a day. Choose foods and drinks that are at room temperature. Ask your nurse or doctor about other helpful tips and medicine that is available to help stop or lessen these symptoms. . To help with decreased appetite, eat small, frequent meals. . Eat high caloric food such as pudding, ice cream, yogurt and milkshakes. . If you get a rash, do not put anything on it unless your doctor or nurse says you may. Keep the area around the rash clean and dry. Ask your doctor for medicine if your rash bothers you. Marland Kitchen Keeping your pain under control is important to your well-being. Please tell your doctor or nurse if you are experiencing pain. . If you are having trouble sleeping, talk to your nurse or doctor on tips to help you sleep better. . If you have a nose bleed, sit with your head tipped slightly forward. Apply pressure by lightly pinching the bridge of your nose between your thumb and forefinger. Call your doctor if you feel dizzy or faint or if the bleeding doesn't stop after 10 to 15 minutes. . Moisturize your skin several times a day . Avoid sun exposure and apply sunscreen routinely when outdoors  Food and Drug Interactions . There are no known interactions of lenalidomide with food. . Check with your doctor or pharmacist about all other prescription medicines and over-the-counter medicines and dietary supplements (vitamins, minerals, herbs and others) you are taking before starting this medicine as there are known drug interactions with lenalidomide. Also, check with your doctor or pharmacist before starting any new prescription or  over-the-counter medicines, or dietary supplements to make sure that there are no interactions. . There are known interactions of lenalidomide with blood-thinning medicine such as warfarin. Ask your doctor what precautions you should take.  When to Call the Doctor Call your doctor or nurse if you have any of these symptoms and/or any new or unusual symptoms: . Fever of 100.4 F (38 C) or higher . Chills . Tiredness that interferes with your daily activities . Feeling dizzy or lightheaded . Easy bleeding or bruising . Your leg or arm is swollen, red, warm and/or painful . Headache that does not go away .  Nose bleed that doesn't stop bleeding after 10-15 minutes . Painful lymph nodes . Wheezing and/or trouble breathing . Chest pain or symptoms of a heart attack. Most heart attacks involve pain in the center of the chest that lasts more than a few minutes. The pain may go away and come back. It can feel like pressure, squeezing, fullness, or pain. Sometimes pain is felt in one or both arms, the back, neck, jaw, or stomach. If any of these symptoms last 2 minutes, call 911. Marland Kitchen Symptoms of a stroke such as sudden numbness or weakness of your face, arm, or leg, mostly on one side of your body; sudden confusion, trouble speaking or understanding; sudden trouble seeing in one or both eyes; sudden trouble walking, feeling dizzy, loss of balance or coordination; or sudden, bad headache with no known cause. If you have any of these symptoms for 2 minutes, call 911. Marland Kitchen Coughing up yellow, green, or bloody mucus . Feeling that your heart is beating in a fast or not normal way (palpitations) . Nausea that stops you from eating or drinking and/or is not relieved by prescribed medicines . Throwing up more than 3 times a day . Loose bowel movements (diarrhea) 4 times a day or loose bowel movements with lack of strength or a feeling of being dizzy . No bowel movement in 3 days or when you feel  uncomfortable . Trouble falling or staying asleep . Pain in your abdomen that does not go away . Weight gain of 5 pounds in one week (fluid retention) . Swelling of your legs, ankles and/or feet . Unexplained weight gain . Lasting loss of appetite or rapid weight loss of five pounds in a week . Pain that does not go away, or is not relieved by prescribed medicines . Flu-like symptoms: fever, headache, muscle and joint aches, and fatigue (low energy, feeling weak) . A new rash or itching that is not relieved by prescribed medicines . Signs of possible liver problems: dark urine, pale bowel movements, bad stomach pain, feeling very tired and weak, unusual itching, or yellowing of the eyes or skin . Signs of tumor lysis: Confusion or agitation, decreased urine, nausea/vomiting, diarrhea, muscle cramping, numbness and/or tingling, seizures. . If you think you may be pregnant or may have impregnated your partner  Reproduction Warnings . Pregnancy warning: This drug can have harmful effects on the unborn baby. Women of childbearing potential should use 2 effective methods of birth control, one of which, must be a highly effective method of birth control, beginning at least 4 weeks before treatment starts, during your cancer treatment, including dose interruptions, and for at least 4 weeks after treatment. A highly effective method of birth control includes tubal ligation, intra-uterine device (IUD), hormonal (birth control pills, injections, patch and/or implants) or a partner's vasectomy. Let your doctor know right away if you think you may be pregnant . Two negative pregnancy tests are required in women of child-bearing potential prior to starting treatment. . You will need to have routine pregnancy tests while you are taking this drug. . Men with female partners of child-bearing potential should use effective methods of birth control during your cancer treatment and for at least 4 weeks  after your cancer treatment. You should always wear a condom even if you have undergone a successful vasectomy. Let your doctor know right away if you think you may have impregnated your partner. . Breastfeeding warning: Women should not breastfeed during treatment because this drug could enter  the breast milk and cause harm to a breastfeeding baby.  SELF CARE ACTIVITIES WHILE RECEIVING CHEMOTHERAPY:  Hydration Increase your fluid intake 48 hours prior to treatment and drink at least 8 to 12 cups (64 ounces) of water/decaffeinated beverages per day after treatment. You can still have your cup of coffee or soda but these beverages do not count as part of your 8 to 12 cups that you need to drink daily. No alcohol intake.  Medications Continue taking your normal prescription medication as prescribed.  If you start any new herbal or new supplements please let us know first to make sure it is safe.  Mouth Care Have teeth cleaned professionally before starting treatment. Keep dentures and partial plates clean. Use soft toothbrush and do not use mouthwashes that contain alcohol. Biotene is a good mouthwash that is available at most pharmacies or may be ordered by calling [phone]. Use warm salt water gargles (1 teaspoon salt per 1 quart warm water) before and after meals and at bedtime. If you need dental work, please let the doctor know before you go for your appointment so that we can coordinate the best possible time for you in regards to your chemo regimen. You need to also let your dentist know that you are actively taking chemo. We may need to do labs prior to your dental appointment.  Skin Care Always use sunscreen that has not expired and with SPF (Sun Protection Factor) of 50 or higher. Wear hats to protect your head from the sun. Remember to use sunscreen on your hands, ears, face, & feet.  Use good moisturizing lotions such as udder cream, eucerin, or even Vaseline. Some chemotherapies  can cause dry skin, color changes in your skin and nails.    . Avoid long, hot showers or baths. . Use gentle, fragrance-free soaps and laundry detergent. . Use moisturizers, preferably creams or ointments rather than lotions because the thicker consistency is better at preventing skin dehydration. Apply the cream or ointment within 15 minutes of showering. Reapply moisturizer at night, and moisturize your hands every time after you wash them.  Hair Loss (if your doctor says your hair will fall out)  . If your doctor says that your hair is likely to fall out, decide before you begin chemo whether you want to wear a wig. You may want to shop before treatment to match your hair color. . Hats, turbans, and scarves can also camouflage hair loss, although some people prefer to leave their heads uncovered. If you go bare-headed outdoors, be sure to use sunscreen on your scalp. . Cut your hair short. It eases the inconvenience of shedding lots of hair, but it also can reduce the emotional impact of watching your hair fall out. . Don't perm or color your hair during chemotherapy. Those chemical treatments are already damaging to hair and can enhance hair loss. Once your chemo treatments are done and your hair has grown back, it's OK to resume dyeing or perming hair.  With chemotherapy, hair loss is almost always temporary. But when it grows back, it may be a different color or texture. In older adults who still had hair color before chemotherapy, the new growth may be completely gray.  Often, new hair is very fine and soft.  Infection Prevention Please wash your hands for at least 30 seconds using warm soapy water. Handwashing is the #1 way to prevent the spread of germs. Stay away from sick people or people who are getting over  a cold. If you develop respiratory systems such as green/yellow mucus production or productive cough or persistent cough let us know and we will see if you need an antibiotic. It is  a good idea to keep a pair of gloves on when going into grocery stores/[employer] to decrease your risk of coming into contact with germs on the carts, etc. Carry alcohol hand gel with you at all times and use it frequently if out in public. If your temperature reaches 100.5 or higher please call the clinic and let us know.  If it is after hours or on the weekend please go to the ER if your temperature is over 100.5.  Please have your own personal thermometer at home to use.    Sex and bodily fluids If you are going to have sex, a condom must be used to protect the person that isn't taking chemotherapy. Chemo can decrease your libido (sex drive). For a few days after chemotherapy, chemotherapy can be excreted through your bodily fluids.  When using the toilet please close the lid and flush the toilet twice.  Do this for a few day after you have had chemotherapy.   Effects of chemotherapy on your sex life Some changes are simple and won't last long. They won't affect your sex life permanently.  Sometimes you may feel: . too tired . not strong enough to be very active . sick or sore  . not in the mood . anxious or low Your anxiety might not seem related to sex. For example, you may be worried about the cancer and how your treatment is going. Or you may be worried about money, or about how you family are coping with your illness. These things can cause stress, which can affect your interest in sex. It's important to talk to your partner about how you feel. Remember - the changes to your sex life don't usually last long. There's usually no medical reason to stop having sex during chemo. The drugs won't have any long term physical effects on your performance or enjoyment of sex. Cancer can't be passed on to your partner during sex  Contraception It's important to use reliable contraception during treatment. Avoid getting pregnant while you or your partner are having chemotherapy. This is because the drugs  may harm the baby. Sometimes chemotherapy drugs can leave a man or woman infertile.  This means you would not be able to have children in the future. You might want to talk to someone about permanent infertility. It can be very difficult to learn that you may no longer be able to have children. Some people find counselling helpful. There might be ways to preserve your fertility, although this is easier for men than for women. You may want to speak to a fertility expert. You can talk about sperm banking or harvesting your eggs. You can also ask about other fertility options, such as donor eggs. If you have or have had breast cancer, your doctor might advise you not to take the contraceptive pill. This is because the hormones in it might affect the cancer.  It is not known for sure whether or not chemotherapy drugs can be passed on through semen or secretions from the vagina. Because of this some doctors advise people to use a barrier method if you have sex during treatment. This applies to vaginal, anal or oral sex. Generally, doctors advise a barrier method only for the time you are actually having the treatment and for about a  week after your treatment. Advice like this can be worrying, but this does not mean that you have to avoid being intimate with your partner. You can still have close contact with your partner and continue to enjoy sex.  Animals If you have cats or birds we just ask that you not change the litter or change the cage.  Please have someone else do this for you while you are on chemotherapy.   Food Safety During and After Cancer Treatment Food safety is important for people both during and after cancer treatment. Cancer and cancer treatments, such as chemotherapy, radiation therapy, and stem cell/bone marrow transplantation, often weaken the immune system. This makes it harder for your body to protect itself from foodborne illness, also called food poisoning. Foodborne illness is caused  by eating food that contains harmful bacteria, parasites, or viruses.  Foods to avoid Some foods have a higher risk of becoming tainted with bacteria. These include: Marland Kitchen Unwashed fresh fruit and vegetables, especially leafy vegetables that can hide dirt and other contaminants . Raw sprouts, such as alfalfa sprouts . Raw or undercooked beef, especially ground beef, or other raw or undercooked meat and poultry . Fatty, fried, or spicy foods immediately before or after treatment.  These can sit heavy on your stomach and make you feel nauseous. . Raw or undercooked shellfish, such as oysters. . Sushi and sashimi, which often contain raw fish.  . Unpasteurized beverages, such as unpasteurized fruit juices, raw milk, raw yogurt, or cider . Undercooked eggs, such as soft boiled, over easy, and poached; raw, unpasteurized eggs; or foods made with raw egg, such as homemade raw cookie dough and homemade mayonnaise  Simple steps for food safety  Shop smart. . Do not buy food stored or displayed in an unclean area. . Do not buy bruised or damaged fruits or vegetables. . Do not buy cans that have cracks, dents, or bulges. . Pick up foods that can spoil at the end of your shopping trip and store them in a cooler on the way home.  Prepare and clean up foods carefully. . Rinse all fresh fruits and vegetables under running water, and dry them with a clean towel or paper towel. . Clean the top of cans before opening them. . After preparing food, wash your hands for 20 seconds with hot water and soap. Pay special attention to areas between fingers and under nails. . Clean your utensils and dishes with hot water and soap. Marland Kitchen Disinfect your kitchen and cutting boards using 1 teaspoon of liquid, unscented bleach mixed into 1 quart of water.    Dispose of old food. . Eat canned and packaged food before its expiration date (the "use by" or "best before" date). . Consume refrigerated leftovers within 3 to 4 days.  After that time, throw out the food. Even if the food does not smell or look spoiled, it still may be unsafe. Some bacteria, such as Listeria, can grow even on foods stored in the refrigerator if they are kept for too long.  Take precautions when eating out. . At restaurants, avoid buffets and salad bars where food sits out for a long time and comes in contact with many people. Food can become contaminated when someone with a virus, often a norovirus, or another "bug" handles it. . Put any leftover food in a "to-go" container yourself, rather than having the server do it. And, refrigerate leftovers as soon as you get home. . Choose restaurants that are clean  and that are willing to prepare your food as you order it cooked.   AT HOME MEDICATIONS:                                                                                                                                                                Compazine/Prochlorperazine 10mg  tablet. Take 1 tablet every 6 hours as needed for nausea/vomiting. (This can make you sleepy)   EMLA cream. Apply a quarter size amount to port site 1 hour prior to chemo. Do not rub in. Cover with plastic wrap.    Diarrhea Sheet   If you are having loose stools/diarrhea, please purchase Imodium and begin taking as outlined:  At the first sign of poorly formed or loose stools you should begin taking Imodium (loperamide) 2 mg capsules.  Take two tablets (4mg ) followed by one tablet (2mg ) every 2 hours - DO NOT EXCEED 8 tablets in 24 hours.  If it is bedtime and you are having loose stools, take 2 tablets at bedtime, then 2 tablets every 4 hours until morning.   Always call the [facility] if you are having loose stools/diarrhea that you can't get under control.  Loose stools/diarrhea leads to dehydration (loss of water) in your body.  We have other options of trying to get the loose stools/diarrhea to stop but you must let us know!   Constipation Sheet  Colace  - 100 mg capsules - take 2 capsules daily.  If this doesn't help then you can increase to 2 capsules twice daily.  Please call if the above does not work for you. Do not go more than 2 days without a bowel movement.  It is very important that you do not become constipated.  It will make you feel sick to your stomach (nausea) and can cause abdominal pain and vomiting.  Nausea Sheet   Compazine/Prochlorperazine 10mg  tablet. Take 1 tablet every 6 hours as needed for nausea/vomiting (This can make you drowsy).  If you are having persistent nausea (nausea that does not stop) please call the [facility] and let us know the amount of nausea that you are experiencing.  If you begin to vomit, you need to call the [facility] and if it is the weekend and you have vomited more than one time and can't get it to stop-go to the Emergency Room.  Persistent nausea/vomiting can lead to dehydration (loss of fluid in your body) and will make you feel very weak and unwell. Ice chips, sips of clear liquids, foods that are at room temperature, crackers, and toast tend to be better tolerated.    SYMPTOMS TO REPORT AS SOON AS POSSIBLE AFTER TREATMENT:  FEVER GREATER THAN 100.5 F  CHILLS WITH OR WITHOUT FEVER  NAUSEA AND VOMITING THAT IS  NOT CONTROLLED WITH YOUR NAUSEA MEDICATION  UNUSUAL SHORTNESS OF BREATH  UNUSUAL BRUISING OR BLEEDING  TENDERNESS IN MOUTH AND THROAT WITH OR WITHOUT   PRESENCE OF ULCERS  URINARY PROBLEMS  BOWEL PROBLEMS  UNUSUAL RASH    Wear comfortable clothing and clothing appropriate for easy access to any Portacath or PICC line. Let us know if there is anything that we can do to make your therapy better!   What to do if you need assistance after hours or on the weekends: CALL [phone].  HOLD on the line, do not hang up.  You will hear multiple messages but at the end you will be connected with a nurse triage line.  They will contact the doctor if necessary.  Most of the  time they will be able to assist you.  Do not call the hospital operator.      I have been informed and understand all of the instructions given to me and have received a copy. I have been instructed to call the clinic [phone] or my family physician as soon as possible for continued medical care, if indicated. I do not have any more questions at this time but understand that I may call the [facility] or the Patient Navigator at [phone] during office hours should I have questions or need assistance in obtaining follow-up care.

## 2019-08-19 ENCOUNTER — Inpatient Hospital Stay (HOSPITAL_COMMUNITY): Payer: BC Managed Care – PPO

## 2019-08-19 ENCOUNTER — Other Ambulatory Visit: Payer: Self-pay

## 2019-08-19 ENCOUNTER — Other Ambulatory Visit (HOSPITAL_COMMUNITY)
Admission: RE | Admit: 2019-08-19 | Discharge: 2019-08-19 | Disposition: A | Discharge status: Home / Self Care | Payer: BC Managed Care – PPO | Source: Ambulatory Visit | Attending: Nurse Practitioner | Admitting: Nurse Practitioner

## 2019-08-19 ENCOUNTER — Inpatient Hospital Stay (HOSPITAL_COMMUNITY): Payer: BC Managed Care – PPO | Attending: Hematology

## 2019-08-19 ENCOUNTER — Inpatient Hospital Stay (HOSPITAL_BASED_OUTPATIENT_CLINIC_OR_DEPARTMENT_OTHER): Payer: BC Managed Care – PPO | Admitting: Hematology

## 2019-08-19 ENCOUNTER — Encounter (HOSPITAL_COMMUNITY): Payer: Self-pay | Admitting: Hematology

## 2019-08-19 VITALS — BP 139/74 | HR 88 | Temp 97.5°F | Resp 18 | Wt 176.6 lb

## 2019-08-19 DIAGNOSIS — D472 Monoclonal gammopathy: Secondary | ICD-10-CM

## 2019-08-19 DIAGNOSIS — Z5112 Encounter for antineoplastic immunotherapy: Secondary | ICD-10-CM | POA: Insufficient documentation

## 2019-08-19 DIAGNOSIS — R609 Edema, unspecified: Secondary | ICD-10-CM | POA: Insufficient documentation

## 2019-08-19 DIAGNOSIS — C9000 Multiple myeloma not having achieved remission: Secondary | ICD-10-CM

## 2019-08-19 DIAGNOSIS — Z8249 Family history of ischemic heart disease and other diseases of the circulatory system: Secondary | ICD-10-CM | POA: Insufficient documentation

## 2019-08-19 DIAGNOSIS — Z833 Family history of diabetes mellitus: Secondary | ICD-10-CM | POA: Insufficient documentation

## 2019-08-19 DIAGNOSIS — R197 Diarrhea, unspecified: Secondary | ICD-10-CM | POA: Diagnosis not present

## 2019-08-19 DIAGNOSIS — M8580 Other specified disorders of bone density and structure, unspecified site: Secondary | ICD-10-CM | POA: Diagnosis present

## 2019-08-19 DIAGNOSIS — E876 Hypokalemia: Secondary | ICD-10-CM | POA: Diagnosis not present

## 2019-08-19 DIAGNOSIS — M545 Low back pain: Secondary | ICD-10-CM | POA: Diagnosis not present

## 2019-08-19 DIAGNOSIS — C9030 Solitary plasmacytoma not having achieved remission: Secondary | ICD-10-CM | POA: Diagnosis present

## 2019-08-19 DIAGNOSIS — Z809 Family history of malignant neoplasm, unspecified: Secondary | ICD-10-CM | POA: Insufficient documentation

## 2019-08-19 DIAGNOSIS — Z800 Family history of malignant neoplasm of digestive organs: Secondary | ICD-10-CM | POA: Insufficient documentation

## 2019-08-19 DIAGNOSIS — Z818 Family history of other mental and behavioral disorders: Secondary | ICD-10-CM | POA: Diagnosis not present

## 2019-08-19 DIAGNOSIS — K5900 Constipation, unspecified: Secondary | ICD-10-CM | POA: Insufficient documentation

## 2019-08-19 DIAGNOSIS — Z853 Personal history of malignant neoplasm of breast: Secondary | ICD-10-CM | POA: Diagnosis not present

## 2019-08-19 DIAGNOSIS — Z79899 Other long term (current) drug therapy: Secondary | ICD-10-CM | POA: Diagnosis not present

## 2019-08-19 DIAGNOSIS — R05 Cough: Secondary | ICD-10-CM | POA: Diagnosis not present

## 2019-08-19 LAB — CBC WITH DIFFERENTIAL/PLATELET
Abs Immature Granulocytes: 0.02 10*3/uL (ref 0.00–0.07)
Basophils Absolute: 0 10*3/uL (ref 0.0–0.1)
Basophils Relative: 1 %
Eosinophils Absolute: 0.2 10*3/uL (ref 0.0–0.5)
Eosinophils Relative: 5 %
HCT: 32.7 % — ABNORMAL LOW (ref 36.0–46.0)
Hemoglobin: 10.1 g/dL — ABNORMAL LOW (ref 12.0–15.0)
Immature Granulocytes: 1 %
Lymphocytes Relative: 24 %
Lymphs Abs: 1 10*3/uL (ref 0.7–4.0)
MCH: 29.2 pg (ref 26.0–34.0)
MCHC: 30.9 g/dL (ref 30.0–36.0)
MCV: 94.5 fL (ref 80.0–100.0)
Monocytes Absolute: 0.4 10*3/uL (ref 0.1–1.0)
Monocytes Relative: 10 %
Neutro Abs: 2.5 10*3/uL (ref 1.7–7.7)
Neutrophils Relative %: 59 %
Platelets: 297 10*3/uL (ref 150–400)
RBC: 3.46 MIL/uL — ABNORMAL LOW (ref 3.87–5.11)
RDW: 13.1 % (ref 11.5–15.5)
WBC: 4.3 10*3/uL (ref 4.0–10.5)
nRBC: 0 % (ref 0.0–0.2)

## 2019-08-19 LAB — COMPREHENSIVE METABOLIC PANEL
ALT: 33 U/L (ref 0–44)
AST: 25 U/L (ref 15–41)
Albumin: 3.2 g/dL — ABNORMAL LOW (ref 3.5–5.0)
Alkaline Phosphatase: 149 U/L — ABNORMAL HIGH (ref 38–126)
Anion gap: 15 (ref 5–15)
BUN: 16 mg/dL (ref 8–23)
CO2: 23 mmol/L (ref 22–32)
Calcium: 9.2 mg/dL (ref 8.9–10.3)
Chloride: 96 mmol/L — ABNORMAL LOW (ref 98–111)
Creatinine, Ser: 0.79 mg/dL (ref 0.44–1.00)
GFR calc Af Amer: >60 mL/min (ref >60)
GFR calc non Af Amer: >60 mL/min (ref >60)
Glucose, Bld: 94 mg/dL (ref 70–99)
Potassium: 3.1 mmol/L — ABNORMAL LOW (ref 3.5–5.1)
Sodium: 134 mmol/L — ABNORMAL LOW (ref 135–145)
Total Bilirubin: 0.3 mg/dL (ref 0.3–1.2)
Total Protein: 9.9 g/dL — ABNORMAL HIGH (ref 6.5–8.1)

## 2019-08-19 LAB — VITAMIN D 25 HYDROXY (VIT D DEFICIENCY, FRACTURES): Vit D, 25-Hydroxy: 20.2 ng/mL — ABNORMAL LOW (ref 30–100)

## 2019-08-19 MED ORDER — PROCHLORPERAZINE MALEATE 10 MG PO TABS
10.0000 mg | ORAL_TABLET | Freq: Once | ORAL | Status: AC
  Administered 2019-08-19: 10 mg via ORAL
  Filled 2019-08-19: qty 1

## 2019-08-19 MED ORDER — PROCHLORPERAZINE MALEATE 10 MG PO TABS: 10.0000 mg | ORAL_TABLET | Freq: Four times a day (QID) | ORAL | 2 refills | Status: DC | PRN

## 2019-08-19 MED ORDER — BORTEZOMIB CHEMO SQ INJECTION 3.5 MG (2.5MG/ML)
1.3000 mg/m2 | Freq: Once | INTRAMUSCULAR | Status: AC
  Administered 2019-08-19: 2.75 mg via SUBCUTANEOUS
  Filled 2019-08-19: qty 1.1

## 2019-08-19 NOTE — Progress Notes (Signed)
Patient seen by Dr. Delton Coombes with lab review and ok to treat today verbal order.  Patient to start an aspirin today with understanding verbalized.  No s/s of distress noted.   Reviewed chemotherapy plan with the patient and side effects of medications with questions asked and answered.    Patient tolerated Velcade injection with no complaints voiced.  Site clean and dry with no bruising or swelling noted at site.  Band aid applied.  Vss with discharge and left ambulatory with no s/s of distress noted.

## 2019-08-19 NOTE — Patient Instructions (Addendum)
[facility] at [facility] Discharge Instructions  You were seen today by Dr. Katragadda. He went over your recent lab results. He will see you back in 1 week for labs and follow up.   Thank you for choosing [facility] at [facility] to provide your oncology and hematology care.  To afford each patient quality time with our provider, please arrive at least 15 minutes before your scheduled appointment time.   If you have a lab appointment with the Cancer Center please come in thru the  Main Entrance and check in at the main information desk  You need to re-schedule your appointment should you arrive 10 or more minutes late.  We strive to give you quality time with our providers, and arriving late affects you and other patients whose appointments are after yours.  Also, if you no show three or more times for appointments you may be dismissed from the clinic at the providers discretion.     Again, thank you for choosing [facility].  Our hope is that these requests will decrease the amount of time that you wait before being seen by our physicians.       _____________________________________________________________  Should you have questions after your visit to [facility], please contact our office at [phone] between the hours of 8:00 a.m. and 4:30 p.m.  Voicemails left after 4:00 p.m. will not be returned until the following business day.  For prescription refill requests, have your pharmacy contact our office and allow 72 hours.    Cancer Center Support Programs:   > Cancer Support Group  2nd Tuesday of the month 1pm-2pm, Journey Room    

## 2019-08-19 NOTE — Progress Notes (Signed)
[place], [place] [address]   CLINIC:  Medical Oncology/Hematology  PCP:  [name], MD [place] [id number] [phone]   REASON FOR VISIT:  Follow-up for monoclonal gammopathy.  CURRENT THERAPY: RVD    INTERVAL HISTORY:  [name] 63 y.o. female seen for follow-up of multiple myeloma.  We have requested second opinion consultation at [facility].  Patient would like to proceed with her first cycle as she has appointment at [facility] in January.  Appetite is 100%.  Energy levels are 75%.  Lower back pain is reported as 6 out of 10.  She has not taken hydrocodone in the last few days.  Previously she was taking 4 times a day.  She is also taking ibuprofen as needed for pain.  She is taking Lasix as needed and stopped taking it a week ago.  She is taking potassium 10 mEq daily.  Reports some constipation and dry cough which is stable.    REVIEW OF SYSTEMS:  Review of Systems  Respiratory: Positive for cough.   Gastrointestinal: Positive for constipation.  Musculoskeletal: Positive for back pain.  All other systems reviewed and are negative.    PAST MEDICAL/SURGICAL HISTORY:  Past Medical History:  Diagnosis Date  . Breast cancer ([place]) 2006   left breast  . Breast mass, right   . Compression fracture of cervical spine ([place]) 06/18/2019   recent Chest xray stating had fx.  . High cholesterol   . History of bladder infections   . Hypertension   . Rash Aug 2013   Past Surgical History:  Procedure Laterality Date  . ABDOMINAL HYSTERECTOMY     just uterus removed  . BREAST BIOPSY    . BREAST LUMPECTOMY WITH RADIOACTIVE SEED LOCALIZATION Right 07/24/2018   Procedure: RIGHT BREAST LUMPECTOMY WITH RADIOACTIVE SEED LOCALIZATION;  Surgeon: Coralie Keens, MD;  Location: [facility];  Service: General;  Laterality: Right;  . MASTECTOMY  5/06   left  . PARTIAL HYSTERECTOMY    . PORT-A-CATH REMOVAL    .  PORTACATH PLACEMENT       SOCIAL HISTORY:  Social History   Socioeconomic History  . Marital status: Married    Spouse name: Not on file  . Number of children: 2  . Years of education: Not on file  . Highest education level: Not on file  Occupational History    Employer: [employer]  Tobacco Use  . Smoking status: Never Smoker  . Smokeless tobacco: Never Used  Substance and Sexual Activity  . Alcohol use: No  . Drug use: No  . Sexual activity: Yes    Birth control/protection: Surgical  Other Topics Concern  . Not on file  Social History Narrative  . Not on file   Social Determinants of Health   Financial Resource Strain:   . Difficulty of Paying Living Expenses: Not on file  Food Insecurity:   . Worried About Charity fundraiser in the Last Year: Not on file  . Ran Out of Food in the Last Year: Not on file  Transportation Needs:   . Lack of Transportation (Medical): Not on file  . Lack of Transportation (Non-Medical): Not on file  Physical Activity:   . Days of Exercise per Week: Not on file  . Minutes of Exercise per Session: Not on file  Stress:   . Feeling of Stress : Not on file  Social Connections:   . Frequency of  Communication with Friends and Family: Not on file  . Frequency of Social Gatherings with Friends and Family: Not on file  . Attends Religious Services: Not on file  . Active Member of Clubs or Organizations: Not on file  . Attends Archivist Meetings: Not on file  . Marital Status: Not on file  Intimate Partner Violence:   . Fear of Current or Ex-Partner: Not on file  . Emotionally Abused: Not on file  . Physically Abused: Not on file  . Sexually Abused: Not on file    FAMILY HISTORY:  Family History  Problem Relation Age of Onset  . Dementia Mother   . Stomach cancer Father   . Cancer Brother   . Cancer Brother   . Diabetes Brother   . Colitis Daughter   . Hypertension Daughter   . Hypertension Daughter     CURRENT  MEDICATIONS:  Outpatient Encounter Medications as of 08/19/2019  Medication Sig  . acyclovir (ZOVIRAX) 400 MG tablet Take 1 tablet (400 mg total) by mouth 2 (two) times daily.  Marland Kitchen atenolol (TENORMIN) 50 MG tablet Take 50 mg by mouth daily.    . bortezomib IV (VELCADE) 3.5 MG injection Inject 2.75 mg into the vein once a week. Days 1, 8, 15 every 21 days   . dexamethasone (DECADRON) 4 MG tablet Take 10 tablets (40 mg) on days 1, 8, and 15 of chemo. Repeat every 21 days.  . hydrochlorothiazide 25 MG tablet Take 25 mg by mouth daily.    Marland Kitchen lenalidomide (REVLIMID) 25 MG capsule Take one capsule daily on days 1-14 every 21 days.  . potassium chloride (K-DUR) 10 MEQ tablet Take 10 mEq by mouth daily.  . Aspirin-Salicylamide-Caffeine (BC HEADACHE POWDER PO) Take 1 tablet by mouth as needed.   Marland Kitchen HYDROcodone-acetaminophen (NORCO) 10-325 MG tablet ORAL TAKE 1 TABLET UP TO 3 TIMES A DAY FOR SEVERE PAIN.  Marland Kitchen ibuprofen (ADVIL) 800 MG tablet Take 800 mg by mouth 3 (three) times daily.  . [DISCONTINUED] furosemide (LASIX) 20 MG tablet Take 1 tablet (20 mg total) by mouth daily as needed. (Patient not taking: Reported on 08/19/2019)   No facility-administered encounter medications on file as of 08/19/2019.    ALLERGIES:  No Known Allergies   PHYSICAL EXAM:  ECOG Performance status: 1  Vitals:   08/19/19 0918  BP: 139/74  Pulse: 88  Resp: 18  Temp: (!) 97.5 F (36.4 C)  SpO2: 97%   Filed Weights   08/19/19 0918  Weight: 176 lb 9.6 oz (80.1 kg)    Physical Exam Vitals signs reviewed.  Constitutional:      Appearance: Normal appearance.  Cardiovascular:     Rate and Rhythm: Normal rate and regular rhythm.     Heart sounds: Normal heart sounds.  Pulmonary:     Effort: Pulmonary effort is normal.     Breath sounds: Normal breath sounds.  Abdominal:     General: There is no distension.     Palpations: Abdomen is soft. There is no mass.  Musculoskeletal:     Right lower leg: Edema present.      Left lower leg: Edema present.  Skin:    General: Skin is warm.  Neurological:     General: No focal deficit present.     Mental Status: She is alert and oriented to person, place, and time.  Psychiatric:        Mood and Affect: Mood normal.        Behavior:  Behavior normal.      LABORATORY DATA:  I have reviewed the labs as listed.  CBC    Component Value Date/Time   WBC 4.3 08/19/2019 0846   RBC 3.46 (L) 08/19/2019 0846   HGB 10.1 (L) 08/19/2019 0846   HCT 32.7 (L) 08/19/2019 0846   PLT 297 08/19/2019 0846   MCV 94.5 08/19/2019 0846   MCH 29.2 08/19/2019 0846   MCHC 30.9 08/19/2019 0846   RDW 13.1 08/19/2019 0846   LYMPHSABS 1.0 08/19/2019 0846   MONOABS 0.4 08/19/2019 0846   EOSABS 0.2 08/19/2019 0846   BASOSABS 0.0 08/19/2019 0846   CMP Latest Ref Rng & Units 08/19/2019 07/23/2019 06/02/2019  Glucose 70 - 99 mg/dL 94 95 103(H)  BUN 8 - 23 mg/dL 16 18 14   Creatinine 0.44 - 1.00 mg/dL 0.79 0.79 0.78  Sodium 135 - 145 mmol/L 134(L) 139 138  Potassium 3.5 - 5.1 mmol/L 3.1(L) 3.3(L) 3.6  Chloride 98 - 111 mmol/L 96(L) 101 100  CO2 22 - 32 mmol/L 23 25 27   Calcium 8.9 - 10.3 mg/dL 9.2 9.7 9.9  Total Protein 6.5 - 8.1 g/dL 9.9(H) - 9.8(H)  Total Bilirubin 0.3 - 1.2 mg/dL 0.3 - 0.6  Alkaline Phos 38 - 126 U/L 149(H) - 120  AST 15 - 41 U/L 25 - 30  ALT 0 - 44 U/L 33 - 36       DIAGNOSTIC IMAGING:  I have independently reviewed the scans and discussed with the patient.     ASSESSMENT & PLAN:   Multiple myeloma (HCC) 1.  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  Kappa light chain 16.1, lambda light chains 140.4, ratio 0.11.  Beta-2 microglobulin 3.8.  LDH was normal.  Creatinine and calcium are normal. - Skeletal survey on 06/02/2019 showed old L1 compression fracture.  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  No evidence of lytic lesions. -24-hour urine shows total protein increased at 190  mg.  Bence-Jones protein positive, lambda type.  Immunofixation was positive. -Bone density test on 07/21/2019 shows T score of -2.2, osteopenic range. -Bone marrow biopsy on 06/22/2019 shows 50% lambda restricted plasma cells.  Hypercellular marrow with trilineage hematopoiesis.  Chromosome analysis was 77, XX.  Myeloma FISH panel was normal. -Recently had worsening back pain.  MRI of the thoracic spine showed acute to subacute benign appearing compression fractures of T8 and T10 with mild to moderate loss of height.  Chronic T12 compression fracture.  MRI of the lumbar spine on 06/25/2019 showed acute to subacute L1 and L3 compression fractures with up to 50% central loss of the height. -Bone density test on 07/21/2019 shows T score of -2.2 consistent with osteopenia. -Because of her fractures in the vertebra, I have recommended treatment for multiple myeloma. -We reviewed RVD regimen in detail.  She will receive Revlimid tomorrow.  We discussed the side effects in detail. -We have arranged for a second opinion consult.  She will be seeing Dr. Samule Ohm in January. -However she would like to proceed with treatment at this time.  We will proceed with cycle 1 today. -We will see her back in 1 week for follow-up.  2.  Back pain: -She has back pain for which she is taking hydrocodone 10/325 4 times daily. -She did not require pain medication in the last few days.  She is also taking ibuprofen as needed.  3.  Breast cancer: -Patient had a  history of right breast cancer in 2000 and 02/2006.  She was reportedly treated with chemotherapy followed by 1 year of Herceptin.  4.  Family history: -Brother had colon cancer.  Another brother had pancreatic cancer.  Father had stomach cancer.  No family history of multiple myeloma.  5.  Hypokalemia: -She is taking Lasix 20 mg as needed for leg swellings.  She stopped it 1 week ago. -Potassium today is 3.1.  She is taking potassium 10 mEq daily. -Today we  will replete potassium.  I will increase potassium to 20 mEq daily.     Orders placed this encounter:  Orders Placed This Encounter  Procedures  . CBC with Differential/Platelet  . Comprehensive metabolic panel  . Magnesium      Derek Jack, MD [place] [phone]

## 2019-08-19 NOTE — Addendum Note (Signed)
Addended by: Donetta Potts on: 08/19/2019 11:15 AM   Modules accepted: Orders

## 2019-08-19 NOTE — Progress Notes (Signed)
Chemotherapy education packet given and discussed with patient.  Discussed diagnosis and staging, tx regimen, and intent of tx.  Reviewed chemotherapy medications and side effects, as well as pre-medications.  Instructed on how to manage side effects at home, and when to call the clinic.  Importance of fever/chills discussed with patient. Discussed precautions to implement at home after receiving tx, as well as self care strategies. Phone numbers provided for clinic during regular working hours, also how to reach the clinic after hours and on weekends. Patient was provided the opportunity to ask questions - all questions answered to patient's satisfaction. Consent was obtained at this time.

## 2019-08-21 ENCOUNTER — Other Ambulatory Visit (HOSPITAL_COMMUNITY): Payer: Self-pay | Admitting: Hematology

## 2019-08-22 ENCOUNTER — Encounter (HOSPITAL_COMMUNITY): Payer: Self-pay | Admitting: Hematology

## 2019-08-22 DIAGNOSIS — C9000 Multiple myeloma not having achieved remission: Secondary | ICD-10-CM | POA: Insufficient documentation

## 2019-08-22 NOTE — Assessment & Plan Note (Signed)
1.  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  Kappa light chain 16.1, lambda light chains 140.4, ratio 0.11.  Beta-2 microglobulin 3.8.  LDH was normal.  Creatinine and calcium are normal. - Skeletal survey on 06/02/2019 showed old L1 compression fracture.  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  No evidence of lytic lesions. -24-hour urine shows total protein increased at 190 mg.  Bence-Jones protein positive, lambda type.  Immunofixation was positive. -Bone density test on 07/21/2019 shows T score of -2.2, osteopenic range. -Bone marrow biopsy on 06/22/2019 shows 50% lambda restricted plasma cells.  Hypercellular marrow with trilineage hematopoiesis.  Chromosome analysis was 68, XX.  Myeloma FISH panel was normal. -Recently had worsening back pain.  MRI of the thoracic spine showed acute to subacute benign appearing compression fractures of T8 and T10 with mild to moderate loss of height.  Chronic T12 compression fracture.  MRI of the lumbar spine on 06/25/2019 showed acute to subacute L1 and L3 compression fractures with up to 50% central loss of the height. -Bone density test on 07/21/2019 shows T score of -2.2 consistent with osteopenia. -Because of her fractures in the vertebra, I have recommended treatment for multiple myeloma. -We reviewed RVD regimen in detail.  She will receive Revlimid tomorrow.  We discussed the side effects in detail. -We have arranged for a second opinion consult.  She will be seeing Dr. Samule Ohm in January. -However she would like to proceed with treatment at this time.  We will proceed with cycle 1 today. -We will see her back in 1 week for follow-up.  2.  Back pain: -She has back pain for which she is taking hydrocodone 10/325 4 times daily. -She did not require pain medication in the last few days.  She is also taking ibuprofen as needed.  3.  Breast cancer: -Patient had a history of right  breast cancer in 2000 and 02/2006.  She was reportedly treated with chemotherapy followed by 1 year of Herceptin.  4.  Family history: -Brother had colon cancer.  Another brother had pancreatic cancer.  Father had stomach cancer.  No family history of multiple myeloma.  5.  Hypokalemia: -She is taking Lasix 20 mg as needed for leg swellings.  She stopped it 1 week ago. -Potassium today is 3.1.  She is taking potassium 10 mEq daily. -Today we will replete potassium.  I will increase potassium to 20 mEq daily.

## 2019-08-25 ENCOUNTER — Other Ambulatory Visit (HOSPITAL_COMMUNITY): Payer: Self-pay | Admitting: *Deleted

## 2019-08-25 ENCOUNTER — Other Ambulatory Visit (HOSPITAL_COMMUNITY)
Admission: RE | Admit: 2019-08-25 | Discharge: 2019-08-25 | Disposition: A | Discharge status: Home / Self Care | Payer: BC Managed Care – PPO | Source: Ambulatory Visit | Attending: Hematology | Admitting: Hematology

## 2019-08-25 DIAGNOSIS — C9000 Multiple myeloma not having achieved remission: Secondary | ICD-10-CM

## 2019-08-25 NOTE — Progress Notes (Signed)
covid

## 2019-08-26 ENCOUNTER — Ambulatory Visit (HOSPITAL_COMMUNITY): Payer: BC Managed Care – PPO

## 2019-08-26 ENCOUNTER — Other Ambulatory Visit (HOSPITAL_COMMUNITY): Payer: BC Managed Care – PPO

## 2019-08-26 ENCOUNTER — Ambulatory Visit (HOSPITAL_COMMUNITY): Payer: BC Managed Care – PPO | Admitting: Hematology

## 2019-09-01 ENCOUNTER — Other Ambulatory Visit (HOSPITAL_COMMUNITY): Payer: Self-pay | Admitting: *Deleted

## 2019-09-01 DIAGNOSIS — D472 Monoclonal gammopathy: Secondary | ICD-10-CM

## 2019-09-01 MED ORDER — LENALIDOMIDE 25 MG PO CAPS: ORAL_CAPSULE | ORAL | 0 refills | Status: DC

## 2019-09-03 ENCOUNTER — Inpatient Hospital Stay (HOSPITAL_COMMUNITY): Payer: BC Managed Care – PPO | Admitting: Hematology

## 2019-09-03 ENCOUNTER — Encounter (HOSPITAL_COMMUNITY): Payer: Self-pay | Admitting: Hematology

## 2019-09-03 ENCOUNTER — Inpatient Hospital Stay (HOSPITAL_COMMUNITY): Payer: BC Managed Care – PPO

## 2019-09-03 ENCOUNTER — Other Ambulatory Visit: Payer: Self-pay

## 2019-09-03 VITALS — BP 134/80 | HR 78 | Temp 97.9°F | Resp 20 | Wt 174.8 lb

## 2019-09-03 DIAGNOSIS — D472 Monoclonal gammopathy: Secondary | ICD-10-CM | POA: Diagnosis not present

## 2019-09-03 DIAGNOSIS — C9000 Multiple myeloma not having achieved remission: Secondary | ICD-10-CM

## 2019-09-03 LAB — CBC WITH DIFFERENTIAL/PLATELET
Abs Immature Granulocytes: 0.01 10*3/uL (ref 0.00–0.07)
Basophils Absolute: 0 10*3/uL (ref 0.0–0.1)
Basophils Relative: 0 %
Eosinophils Absolute: 0.3 10*3/uL (ref 0.0–0.5)
Eosinophils Relative: 8 %
HCT: 34.1 % — ABNORMAL LOW (ref 36.0–46.0)
Hemoglobin: 10.5 g/dL — ABNORMAL LOW (ref 12.0–15.0)
Immature Granulocytes: 0 %
Lymphocytes Relative: 24 %
Lymphs Abs: 0.8 10*3/uL (ref 0.7–4.0)
MCH: 29.1 pg (ref 26.0–34.0)
MCHC: 30.8 g/dL (ref 30.0–36.0)
MCV: 94.5 fL (ref 80.0–100.0)
Monocytes Absolute: 0.3 10*3/uL (ref 0.1–1.0)
Monocytes Relative: 9 %
Neutro Abs: 2 10*3/uL (ref 1.7–7.7)
Neutrophils Relative %: 59 %
Platelets: 388 10*3/uL (ref 150–400)
RBC: 3.61 MIL/uL — ABNORMAL LOW (ref 3.87–5.11)
RDW: 12.9 % (ref 11.5–15.5)
WBC: 3.4 10*3/uL — ABNORMAL LOW (ref 4.0–10.5)
nRBC: 0 % (ref 0.0–0.2)

## 2019-09-03 LAB — COMPREHENSIVE METABOLIC PANEL
ALT: 53 U/L — ABNORMAL HIGH (ref 0–44)
AST: 42 U/L — ABNORMAL HIGH (ref 15–41)
Albumin: 3.2 g/dL — ABNORMAL LOW (ref 3.5–5.0)
Alkaline Phosphatase: 241 U/L — ABNORMAL HIGH (ref 38–126)
Anion gap: 12 (ref 5–15)
BUN: 16 mg/dL (ref 8–23)
CO2: 25 mmol/L (ref 22–32)
Calcium: 8.8 mg/dL — ABNORMAL LOW (ref 8.9–10.3)
Chloride: 99 mmol/L (ref 98–111)
Creatinine, Ser: 0.8 mg/dL (ref 0.44–1.00)
GFR calc Af Amer: >60 mL/min (ref >60)
GFR calc non Af Amer: >60 mL/min (ref >60)
Glucose, Bld: 104 mg/dL — ABNORMAL HIGH (ref 70–99)
Potassium: 3.7 mmol/L (ref 3.5–5.1)
Sodium: 136 mmol/L (ref 135–145)
Total Bilirubin: 0.6 mg/dL (ref 0.3–1.2)
Total Protein: 8.2 g/dL — ABNORMAL HIGH (ref 6.5–8.1)

## 2019-09-03 LAB — MAGNESIUM: Magnesium: 2.1 mg/dL (ref 1.7–2.4)

## 2019-09-03 MED ORDER — BORTEZOMIB CHEMO SQ INJECTION 3.5 MG (2.5MG/ML)
1.3000 mg/m2 | Freq: Once | INTRAMUSCULAR | Status: AC
  Administered 2019-09-03: 2.75 mg via SUBCUTANEOUS
  Filled 2019-09-03: qty 1.1

## 2019-09-03 MED ORDER — PROCHLORPERAZINE MALEATE 10 MG PO TABS: 10.0000 mg | ORAL_TABLET | Freq: Once | ORAL | Status: DC

## 2019-09-03 MED ORDER — ERGOCALCIFEROL 1.25 MG (50000 UT) PO CAPS: 50000.0000 [IU] | ORAL_CAPSULE | ORAL | 1 refills | Status: DC

## 2019-09-03 NOTE — Progress Notes (Signed)
Patient seen by Dr. Delton Coombes with lab review and ok to treat today verbal order.  No complaints of nausea.  Abdomen doing well from last injection.  No s/s of distress noted.   Patient tolerated Velcade injection with no complaints voiced.  Site clean and dry with no bruising or swelling noted at site.  Band aid applied.  Vss with discharge and left ambulatory with no s/s of distress noted.

## 2019-09-03 NOTE — Assessment & Plan Note (Signed)
1.  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  Kappa light chain 16.1, lambda light chains 140.4, ratio 0.11.  Beta-2 microglobulin 3.8.  LDH was normal.  Creatinine and calcium are normal. - Skeletal survey on 06/02/2019 showed old L1 compression fracture.  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  No evidence of lytic lesions. -24-hour urine shows total protein increased at 190 mg.  Bence-Jones protein positive, lambda type.  Immunofixation was positive. -Bone density test on 07/21/2019 shows T score of -2.2, osteopenic range. -Bone marrow biopsy on 06/22/2019 shows 50% lambda restricted plasma cells.  Hypercellular marrow with trilineage hematopoiesis.  Chromosome analysis was 70, XX.  Myeloma FISH panel was normal. -Recently had worsening back pain.  MRI of the thoracic spine showed acute to subacute benign appearing compression fractures of T8 and T10 with mild to moderate loss of height.  Chronic T12 compression fracture.  MRI of the lumbar spine on 06/25/2019 showed acute to subacute L1 and L3 compression fractures with up to 50% central loss of the height. -Bone density test on 07/21/2019 shows T score of -2.2 consistent with osteopenia. -Because of her fractures in the vertebra, I have recommended treatment for multiple myeloma. -Cycle 1 of RVD started on 08/19/2019.  She missed her second weekly dose of Velcade as she had bad cold last week. -She finished Revlimid on 09/02/2019.  She is tolerating dexamethasone very well.  She had diarrhea for couple of days once a day. -She reported improvement in her back pain since she started treatments.  She has not been requiring medication. -She will proceed with her Velcade today.  She will start her Revlimid next week. -She has mildly elevated AST and ALT which we will follow. -We will see her back in 2 weeks for follow-up.  2.  Back pain: -She was taking hydrocodone 10/325 up to 4  times a day.  She has not required any pain medication in the last few days.  She reported improvement in pain since start of therapy.  3.  Breast cancer: -Patient had a history of right breast cancer in 2000 and 02/2006.  She was reportedly treated with chemotherapy followed by 1 year of Herceptin.  4.  Family history: -Brother had colon cancer.  Another brother had pancreatic cancer.  Father had stomach cancer.  No family history of multiple myeloma.  5.  Hypokalemia: -Potassium is normal today at 3.7.  Magnesium is 2.1. -She will continue potassium 20 mEq twice daily.

## 2019-09-03 NOTE — Progress Notes (Signed)
[place], [place] [address]   CLINIC:  Medical Oncology/Hematology  PCP:  [name], MD [place] [address] [phone]   REASON FOR VISIT:  Follow-up for monoclonal gammopathy.  CURRENT THERAPY: RVD    INTERVAL HISTORY:  Stephanie Galvan 63 y.o. female seen for follow-up of multiple myeloma.  She has started Velcade dexamethasone and Revlimid on 08/19/2019.  She finished Revlimid on 09/02/2019.  She missed last week treatment with Velcade and dexamethasone as she had very bad cold.  She was reportedly tested negative for COVID-19.  She had experienced some diarrhea lasted about couple of days.  She reported overall improvement in her back pain since the treatment was started.  She has not required any hydrocodone for the last 4 to 5 days.  She is also not taking any ibuprofen.  She is taking potassium 20 mEq twice daily.   REVIEW OF SYSTEMS:  Review of Systems  Gastrointestinal: Positive for constipation and diarrhea.  All other systems reviewed and are negative.    PAST MEDICAL/SURGICAL HISTORY:  Past Medical History:  Diagnosis Date  . Breast cancer ([place] [facility]) 2006   left breast  . Breast mass, right   . Compression fracture of cervical spine ([place]) 06/18/2019   recent Chest xray stating had fx.  . High cholesterol   . History of bladder infections   . Hypertension   . Rash Aug 2013   Past Surgical History:  Procedure Laterality Date  . ABDOMINAL HYSTERECTOMY     just uterus removed  . BREAST BIOPSY    . BREAST LUMPECTOMY WITH RADIOACTIVE SEED LOCALIZATION Right 07/24/2018   Procedure: RIGHT BREAST LUMPECTOMY WITH RADIOACTIVE SEED LOCALIZATION;  Surgeon: Coralie Keens, MD;  Location: [facility];  Service: General;  Laterality: Right;  . MASTECTOMY  5/06   left  . PARTIAL HYSTERECTOMY    . PORT-A-CATH REMOVAL    . PORTACATH PLACEMENT       SOCIAL HISTORY:  Social History    Socioeconomic History  . Marital status: Married    Spouse name: Not on file  . Number of children: 2  . Years of education: Not on file  . Highest education level: Not on file  Occupational History    Employer: [employer]  Tobacco Use  . Smoking status: Never Smoker  . Smokeless tobacco: Never Used  Substance and Sexual Activity  . Alcohol use: No  . Drug use: No  . Sexual activity: Yes    Birth control/protection: Surgical  Other Topics Concern  . Not on file  Social History Narrative  . Not on file   Social Determinants of Health   Financial Resource Strain:   . Difficulty of Paying Living Expenses: Not on file  Food Insecurity:   . Worried About Charity fundraiser in the Last Year: Not on file  . Ran Out of Food in the Last Year: Not on file  Transportation Needs:   . Lack of Transportation (Medical): Not on file  . Lack of Transportation (Non-Medical): Not on file  Physical Activity:   . Days of Exercise per Week: Not on file  . Minutes of Exercise per Session: Not on file  Stress:   . Feeling of Stress : Not on file  Social Connections:   . Frequency of Communication with Friends and Family: Not on file  . Frequency of Social Gatherings with Friends and Family: Not on file  . Attends  Religious Services: Not on file  . Active Member of Clubs or Organizations: Not on file  . Attends Archivist Meetings: Not on file  . Marital Status: Not on file  Intimate Partner Violence:   . Fear of Current or Ex-Partner: Not on file  . Emotionally Abused: Not on file  . Physically Abused: Not on file  . Sexually Abused: Not on file    FAMILY HISTORY:  Family History  Problem Relation Age of Onset  . Dementia Mother   . Stomach cancer Father   . Cancer Brother   . Cancer Brother   . Diabetes Brother   . Colitis Daughter   . Hypertension Daughter   . Hypertension Daughter     CURRENT MEDICATIONS:  Outpatient Encounter Medications as of 09/03/2019   Medication Sig  . acyclovir (ZOVIRAX) 400 MG tablet Take 1 tablet (400 mg total) by mouth 2 (two) times daily.  Marland Kitchen atenolol (TENORMIN) 50 MG tablet Take 50 mg by mouth daily.    . bortezomib IV (VELCADE) 3.5 MG injection Inject 2.75 mg into the vein once a week. Days 1, 8, 15 every 21 days   . cholecalciferol (VITAMIN D3) 25 MCG (1000 UT) tablet Take 1,000 Units by mouth daily.  . hydrochlorothiazide 25 MG tablet Take 25 mg by mouth daily.    Marland Kitchen lenalidomide (REVLIMID) 25 MG capsule Take one capsule daily on days 1-14 every 21 days.  . potassium chloride (K-DUR) 10 MEQ tablet Take 10 mEq by mouth daily.  . Aspirin-Salicylamide-Caffeine (BC HEADACHE POWDER PO) Take 1 tablet by mouth as needed.   Marland Kitchen dexamethasone (DECADRON) 4 MG tablet Take 10 tablets (40 mg) on days 1, 8, and 15 of chemo. Repeat every 21 days. (Patient not taking: Reported on 09/03/2019)  . ergocalciferol (VITAMIN D2) 1.25 MG (50000 UT) capsule Take 1 capsule (50,000 Units total) by mouth once a week.  . furosemide (LASIX) 20 MG tablet TAKE 1 TABLET BY MOUTH EVERY DAY AS NEEDED (Patient not taking: Reported on 09/03/2019)  . HYDROcodone-acetaminophen (NORCO) 10-325 MG tablet ORAL TAKE 1 TABLET UP TO 3 TIMES A DAY FOR SEVERE PAIN.  Marland Kitchen ibuprofen (ADVIL) 800 MG tablet Take 800 mg by mouth 3 (three) times daily.  . prochlorperazine (COMPAZINE) 10 MG tablet Take 1 tablet (10 mg total) by mouth every 6 (six) hours as needed for nausea or vomiting. (Patient not taking: Reported on 09/03/2019)   No facility-administered encounter medications on file as of 09/03/2019.    ALLERGIES:  No Known Allergies   PHYSICAL EXAM:  ECOG Performance status: 1  Vitals:   09/03/19 0918  BP: 134/80  Pulse: 78  Resp: 20  Temp: 97.9 F (36.6 C)  SpO2: 97%   Filed Weights   09/03/19 0918  Weight: 174 lb 12.8 oz (79.3 kg)    Physical Exam Vitals reviewed.  Constitutional:      Appearance: Normal appearance.  Cardiovascular:     Rate and  Rhythm: Normal rate and regular rhythm.     Heart sounds: Normal heart sounds.  Pulmonary:     Effort: Pulmonary effort is normal.     Breath sounds: Normal breath sounds.  Abdominal:     General: There is no distension.     Palpations: Abdomen is soft. There is no mass.  Musculoskeletal:     Right lower leg: Edema present.     Left lower leg: Edema present.  Skin:    General: Skin is warm.  Neurological:  General: No focal deficit present.     Mental Status: She is alert and oriented to person, place, and time.  Psychiatric:        Mood and Affect: Mood normal.        Behavior: Behavior normal.      LABORATORY DATA:  I have reviewed the labs as listed.  CBC    Component Value Date/Time   WBC 3.4 (L) 09/03/2019 0833   RBC 3.61 (L) 09/03/2019 0833   HGB 10.5 (L) 09/03/2019 0833   HCT 34.1 (L) 09/03/2019 0833   PLT 388 09/03/2019 0833   MCV 94.5 09/03/2019 0833   MCH 29.1 09/03/2019 0833   MCHC 30.8 09/03/2019 0833   RDW 12.9 09/03/2019 0833   LYMPHSABS 0.8 09/03/2019 0833   MONOABS 0.3 09/03/2019 0833   EOSABS 0.3 09/03/2019 0833   BASOSABS 0.0 09/03/2019 0833   CMP Latest Ref Rng & Units 09/03/2019 08/19/2019 07/23/2019  Glucose 70 - 99 mg/dL 104(H) 94 95  BUN 8 - 23 mg/dL 16 16 18   Creatinine 0.44 - 1.00 mg/dL 0.80 0.79 0.79  Sodium 135 - 145 mmol/L 136 134(L) 139  Potassium 3.5 - 5.1 mmol/L 3.7 3.1(L) 3.3(L)  Chloride 98 - 111 mmol/L 99 96(L) 101  CO2 22 - 32 mmol/L 25 23 25   Calcium 8.9 - 10.3 mg/dL 8.8(L) 9.2 9.7  Total Protein 6.5 - 8.1 g/dL 8.2(H) 9.9(H) -  Total Bilirubin 0.3 - 1.2 mg/dL 0.6 0.3 -  Alkaline Phos 38 - 126 U/L 241(H) 149(H) -  AST 15 - 41 U/L 42(H) 25 -  ALT 0 - 44 U/L 53(H) 33 -       DIAGNOSTIC IMAGING:  I have independently reviewed the scans and discussed with the patient.     ASSESSMENT & PLAN:   Multiple myeloma (HCC) 1.  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  Kappa light chain 16.1, lambda light chains 140.4, ratio 0.11.  Beta-2 microglobulin 3.8.  LDH was normal.  Creatinine and calcium are normal. - Skeletal survey on 06/02/2019 showed old L1 compression fracture.  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  No evidence of lytic lesions. -24-hour urine shows total protein increased at 190 mg.  Bence-Jones protein positive, lambda type.  Immunofixation was positive. -Bone density test on 07/21/2019 shows T score of -2.2, osteopenic range. -Bone marrow biopsy on 06/22/2019 shows 50% lambda restricted plasma cells.  Hypercellular marrow with trilineage hematopoiesis.  Chromosome analysis was 85, XX.  Myeloma FISH panel was normal. -Recently had worsening back pain.  MRI of the thoracic spine showed acute to subacute benign appearing compression fractures of T8 and T10 with mild to moderate loss of height.  Chronic T12 compression fracture.  MRI of the lumbar spine on 06/25/2019 showed acute to subacute L1 and L3 compression fractures with up to 50% central loss of the height. -Bone density test on 07/21/2019 shows T score of -2.2 consistent with osteopenia. -Because of her fractures in the vertebra, I have recommended treatment for multiple myeloma. -Cycle 1 of RVD started on 08/19/2019.  She missed her second weekly dose of Velcade as she had bad cold last week. -She finished Revlimid on 09/02/2019.  She is tolerating dexamethasone very well.  She had diarrhea for couple of days once a day. -She reported improvement in her back pain since she started treatments.  She has not been requiring medication. -She will proceed with  her Velcade today.  She will start her Revlimid next week. -She has mildly elevated AST and ALT which we will follow. -We will see her back in 2 weeks for follow-up.  2.  Back pain: -She was taking hydrocodone 10/325 up to 4 times a day.  She has not required any pain medication in the last few days.  She reported  improvement in pain since start of therapy.  3.  Breast cancer: -Patient had a history of right breast cancer in 2000 and 02/2006.  She was reportedly treated with chemotherapy followed by 1 year of Herceptin.  4.  Family history: -Brother had colon cancer.  Another brother had pancreatic cancer.  Father had stomach cancer.  No family history of multiple myeloma.  5.  Hypokalemia: -Potassium is normal today at 3.7.  Magnesium is 2.1. -She will continue potassium 20 mEq twice daily.     Orders placed this encounter:  Orders Placed This Encounter  Procedures  . CBC with Differential/Platelet  . Comprehensive metabolic panel  . Protein electrophoresis, serum  . Kappa/lambda light chains  . Lactate dehydrogenase      Derek Jack, MD [place] [phone]

## 2019-09-03 NOTE — Patient Instructions (Signed)
[place] at [facility] Discharge Instructions  You were seen today by Dr. Delton Coombes. He went over your recent lab results. Continue taking the steroids and injection once every week. He would like you to start on Zometa which will help your bones. Start taking Calcium daily. He will see you back in 2 weeks for labs, injection and follow up.   Thank you for choosing [place] at [facility] to provide your oncology and hematology care.  To afford each patient quality time with our provider, please arrive at least 15 minutes before your scheduled appointment time.   If you have a lab appointment with the [facility] please come in thru the  Main Entrance and check in at the main information desk  You need to re-schedule your appointment should you arrive 10 or more minutes late.  We strive to give you quality time with our providers, and arriving late affects you and other patients whose appointments are after yours.  Also, if you no show three or more times for appointments you may be dismissed from the clinic at the providers discretion.     Again, thank you for choosing [facility].  Our hope is that these requests will decrease the amount of time that you wait before being seen by our physicians.       _____________________________________________________________  Should you have questions after your visit to [facility], please contact our office at [phone] between the hours of 8:00 a.m. and 4:30 p.m.  Voicemails left after 4:00 p.m. will not be returned until the following business day.  For prescription refill requests, have your pharmacy contact our office and allow 72 hours.    Cancer Center Support Programs:   > Cancer Support Group  2nd Tuesday of the month 1pm-2pm, Journey Room

## 2019-09-10 ENCOUNTER — Inpatient Hospital Stay (HOSPITAL_COMMUNITY): Payer: BC Managed Care – PPO

## 2019-09-10 ENCOUNTER — Other Ambulatory Visit: Payer: Self-pay

## 2019-09-10 ENCOUNTER — Encounter (HOSPITAL_COMMUNITY): Payer: Self-pay

## 2019-09-10 VITALS — BP 127/74 | HR 81 | Temp 97.3°F | Resp 18 | Wt 177.3 lb

## 2019-09-10 DIAGNOSIS — D472 Monoclonal gammopathy: Secondary | ICD-10-CM

## 2019-09-10 DIAGNOSIS — C9000 Multiple myeloma not having achieved remission: Secondary | ICD-10-CM

## 2019-09-10 LAB — CBC WITH DIFFERENTIAL/PLATELET
Abs Immature Granulocytes: 0 10*3/uL (ref 0.00–0.07)
Basophils Absolute: 0 10*3/uL (ref 0.0–0.1)
Basophils Relative: 1 %
Eosinophils Absolute: 0.1 10*3/uL (ref 0.0–0.5)
Eosinophils Relative: 5 %
HCT: 32.5 % — ABNORMAL LOW (ref 36.0–46.0)
Hemoglobin: 10.3 g/dL — ABNORMAL LOW (ref 12.0–15.0)
Immature Granulocytes: 0 %
Lymphocytes Relative: 19 %
Lymphs Abs: 0.5 10*3/uL — ABNORMAL LOW (ref 0.7–4.0)
MCH: 29.9 pg (ref 26.0–34.0)
MCHC: 31.7 g/dL (ref 30.0–36.0)
MCV: 94.5 fL (ref 80.0–100.0)
Monocytes Absolute: 0.3 10*3/uL (ref 0.1–1.0)
Monocytes Relative: 13 %
Neutro Abs: 1.6 10*3/uL — ABNORMAL LOW (ref 1.7–7.7)
Neutrophils Relative %: 62 %
Platelets: 298 10*3/uL (ref 150–400)
RBC: 3.44 MIL/uL — ABNORMAL LOW (ref 3.87–5.11)
RDW: 13.5 % (ref 11.5–15.5)
WBC: 2.6 10*3/uL — ABNORMAL LOW (ref 4.0–10.5)
nRBC: 0 % (ref 0.0–0.2)

## 2019-09-10 LAB — COMPREHENSIVE METABOLIC PANEL
ALT: 29 U/L (ref 0–44)
AST: 21 U/L (ref 15–41)
Albumin: 3.3 g/dL — ABNORMAL LOW (ref 3.5–5.0)
Alkaline Phosphatase: 230 U/L — ABNORMAL HIGH (ref 38–126)
Anion gap: 12 (ref 5–15)
BUN: 14 mg/dL (ref 8–23)
CO2: 25 mmol/L (ref 22–32)
Calcium: 8.9 mg/dL (ref 8.9–10.3)
Chloride: 101 mmol/L (ref 98–111)
Creatinine, Ser: 0.7 mg/dL (ref 0.44–1.00)
GFR calc Af Amer: >60 mL/min (ref >60)
GFR calc non Af Amer: >60 mL/min (ref >60)
Glucose, Bld: 90 mg/dL (ref 70–99)
Potassium: 3.6 mmol/L (ref 3.5–5.1)
Sodium: 138 mmol/L (ref 135–145)
Total Bilirubin: 0.5 mg/dL (ref 0.3–1.2)
Total Protein: 7.7 g/dL (ref 6.5–8.1)

## 2019-09-10 LAB — LACTATE DEHYDROGENASE: LDH: 115 U/L (ref 98–192)

## 2019-09-10 MED ORDER — BORTEZOMIB CHEMO SQ INJECTION 3.5 MG (2.5MG/ML)
1.3000 mg/m2 | Freq: Once | INTRAMUSCULAR | Status: AC
  Administered 2019-09-10: 2.75 mg via SUBCUTANEOUS
  Filled 2019-09-10: qty 1.1

## 2019-09-10 MED ORDER — PROCHLORPERAZINE MALEATE 10 MG PO TABS: 10.0000 mg | ORAL_TABLET | Freq: Once | ORAL | Status: DC

## 2019-09-10 NOTE — Progress Notes (Signed)
Labs reviewed, they meet parameters for treatment.   Treatment given per orders. Patient tolerated it well without problems. Vitals stable and discharged home from clinic ambulatory. Follow up as scheduled.

## 2019-09-10 NOTE — Patient Instructions (Signed)

## 2019-09-11 DIAGNOSIS — C9000 Multiple myeloma not having achieved remission: Secondary | ICD-10-CM

## 2019-09-11 HISTORY — DX: Multiple myeloma not having achieved remission: C90.00

## 2019-09-12 ENCOUNTER — Other Ambulatory Visit (HOSPITAL_COMMUNITY): Payer: Self-pay | Admitting: Nurse Practitioner

## 2019-09-14 LAB — KAPPA/LAMBDA LIGHT CHAINS
Kappa free light chain: 13.4 mg/L (ref 3.3–19.4)
Kappa, lambda light chain ratio: 0.7 (ref 0.26–1.65)
Lambda free light chains: 19.2 mg/L (ref 5.7–26.3)

## 2019-09-14 LAB — PROTEIN ELECTROPHORESIS, SERUM
A/G Ratio: 0.9 (ref 0.7–1.7)
Albumin ELP: 3.4 g/dL (ref 2.9–4.4)
Alpha-1-Globulin: 0.2 g/dL (ref 0.0–0.4)
Alpha-2-Globulin: 0.8 g/dL (ref 0.4–1.0)
Beta Globulin: 1 g/dL (ref 0.7–1.3)
Gamma Globulin: 1.6 g/dL (ref 0.4–1.8)
Globulin, Total: 3.7 g/dL (ref 2.2–3.9)
M-Spike, %: 1.2 g/dL — ABNORMAL HIGH
Total Protein ELP: 7.1 g/dL (ref 6.0–8.5)

## 2019-09-16 ENCOUNTER — Other Ambulatory Visit (HOSPITAL_COMMUNITY): Payer: Self-pay | Admitting: Hematology

## 2019-09-17 ENCOUNTER — Inpatient Hospital Stay (HOSPITAL_COMMUNITY): Payer: BC Managed Care – PPO

## 2019-09-17 ENCOUNTER — Other Ambulatory Visit: Payer: Self-pay

## 2019-09-17 ENCOUNTER — Inpatient Hospital Stay (HOSPITAL_COMMUNITY): Payer: BC Managed Care – PPO | Attending: Hematology | Admitting: Hematology

## 2019-09-17 VITALS — BP 136/75 | HR 71 | Temp 97.5°F | Resp 18 | Wt 177.8 lb

## 2019-09-17 DIAGNOSIS — M5137 Other intervertebral disc degeneration, lumbosacral region: Secondary | ICD-10-CM | POA: Diagnosis not present

## 2019-09-17 DIAGNOSIS — M549 Dorsalgia, unspecified: Secondary | ICD-10-CM | POA: Insufficient documentation

## 2019-09-17 DIAGNOSIS — Z7952 Long term (current) use of systemic steroids: Secondary | ICD-10-CM | POA: Diagnosis not present

## 2019-09-17 DIAGNOSIS — M7989 Other specified soft tissue disorders: Secondary | ICD-10-CM | POA: Diagnosis not present

## 2019-09-17 DIAGNOSIS — Z833 Family history of diabetes mellitus: Secondary | ICD-10-CM | POA: Insufficient documentation

## 2019-09-17 DIAGNOSIS — E876 Hypokalemia: Secondary | ICD-10-CM | POA: Diagnosis not present

## 2019-09-17 DIAGNOSIS — Z9012 Acquired absence of left breast and nipple: Secondary | ICD-10-CM | POA: Insufficient documentation

## 2019-09-17 DIAGNOSIS — Z818 Family history of other mental and behavioral disorders: Secondary | ICD-10-CM | POA: Insufficient documentation

## 2019-09-17 DIAGNOSIS — K5900 Constipation, unspecified: Secondary | ICD-10-CM | POA: Insufficient documentation

## 2019-09-17 DIAGNOSIS — R197 Diarrhea, unspecified: Secondary | ICD-10-CM | POA: Diagnosis not present

## 2019-09-17 DIAGNOSIS — Z853 Personal history of malignant neoplasm of breast: Secondary | ICD-10-CM | POA: Diagnosis not present

## 2019-09-17 DIAGNOSIS — M5136 Other intervertebral disc degeneration, lumbar region: Secondary | ICD-10-CM | POA: Insufficient documentation

## 2019-09-17 DIAGNOSIS — Z9221 Personal history of antineoplastic chemotherapy: Secondary | ICD-10-CM | POA: Insufficient documentation

## 2019-09-17 DIAGNOSIS — M8580 Other specified disorders of bone density and structure, unspecified site: Secondary | ICD-10-CM | POA: Diagnosis not present

## 2019-09-17 DIAGNOSIS — Z79899 Other long term (current) drug therapy: Secondary | ICD-10-CM | POA: Insufficient documentation

## 2019-09-17 DIAGNOSIS — D472 Monoclonal gammopathy: Secondary | ICD-10-CM

## 2019-09-17 DIAGNOSIS — Z5112 Encounter for antineoplastic immunotherapy: Secondary | ICD-10-CM | POA: Insufficient documentation

## 2019-09-17 DIAGNOSIS — Z800 Family history of malignant neoplasm of digestive organs: Secondary | ICD-10-CM | POA: Insufficient documentation

## 2019-09-17 DIAGNOSIS — G479 Sleep disorder, unspecified: Secondary | ICD-10-CM | POA: Diagnosis not present

## 2019-09-17 DIAGNOSIS — C9000 Multiple myeloma not having achieved remission: Secondary | ICD-10-CM | POA: Diagnosis not present

## 2019-09-17 DIAGNOSIS — Z809 Family history of malignant neoplasm, unspecified: Secondary | ICD-10-CM | POA: Insufficient documentation

## 2019-09-17 DIAGNOSIS — Z8379 Family history of other diseases of the digestive system: Secondary | ICD-10-CM | POA: Diagnosis not present

## 2019-09-17 DIAGNOSIS — Z8249 Family history of ischemic heart disease and other diseases of the circulatory system: Secondary | ICD-10-CM | POA: Diagnosis not present

## 2019-09-17 DIAGNOSIS — R29890 Loss of height: Secondary | ICD-10-CM | POA: Diagnosis not present

## 2019-09-17 LAB — CBC WITH DIFFERENTIAL/PLATELET
Abs Immature Granulocytes: 0.01 10*3/uL (ref 0.00–0.07)
Basophils Absolute: 0 10*3/uL (ref 0.0–0.1)
Basophils Relative: 1 %
Eosinophils Absolute: 0.1 10*3/uL (ref 0.0–0.5)
Eosinophils Relative: 3 %
HCT: 36.9 % (ref 36.0–46.0)
Hemoglobin: 11.4 g/dL — ABNORMAL LOW (ref 12.0–15.0)
Immature Granulocytes: 0 %
Lymphocytes Relative: 13 %
Lymphs Abs: 0.5 10*3/uL — ABNORMAL LOW (ref 0.7–4.0)
MCH: 29.2 pg (ref 26.0–34.0)
MCHC: 30.9 g/dL (ref 30.0–36.0)
MCV: 94.6 fL (ref 80.0–100.0)
Monocytes Absolute: 0.1 10*3/uL (ref 0.1–1.0)
Monocytes Relative: 3 %
Neutro Abs: 3.4 10*3/uL (ref 1.7–7.7)
Neutrophils Relative %: 80 %
Platelets: 241 10*3/uL (ref 150–400)
RBC: 3.9 MIL/uL (ref 3.87–5.11)
RDW: 14.1 % (ref 11.5–15.5)
WBC: 4.2 10*3/uL (ref 4.0–10.5)
nRBC: 0 % (ref 0.0–0.2)

## 2019-09-17 LAB — COMPREHENSIVE METABOLIC PANEL
ALT: 45 U/L — ABNORMAL HIGH (ref 0–44)
AST: 44 U/L — ABNORMAL HIGH (ref 15–41)
Albumin: 3.5 g/dL (ref 3.5–5.0)
Alkaline Phosphatase: 268 U/L — ABNORMAL HIGH (ref 38–126)
Anion gap: 10 (ref 5–15)
BUN: 12 mg/dL (ref 8–23)
CO2: 25 mmol/L (ref 22–32)
Calcium: 8.7 mg/dL — ABNORMAL LOW (ref 8.9–10.3)
Chloride: 103 mmol/L (ref 98–111)
Creatinine, Ser: 0.76 mg/dL (ref 0.44–1.00)
GFR calc Af Amer: >60 mL/min (ref >60)
GFR calc non Af Amer: >60 mL/min (ref >60)
Glucose, Bld: 91 mg/dL (ref 70–99)
Potassium: 3.5 mmol/L (ref 3.5–5.1)
Sodium: 138 mmol/L (ref 135–145)
Total Bilirubin: 0.4 mg/dL (ref 0.3–1.2)
Total Protein: 7.2 g/dL (ref 6.5–8.1)

## 2019-09-17 MED ORDER — PROCHLORPERAZINE MALEATE 10 MG PO TABS: 10.0000 mg | ORAL_TABLET | Freq: Once | ORAL | Status: DC

## 2019-09-17 MED ORDER — BORTEZOMIB CHEMO SQ INJECTION 3.5 MG (2.5MG/ML)
1.3000 mg/m2 | Freq: Once | INTRAMUSCULAR | Status: AC
  Administered 2019-09-17: 2.75 mg via SUBCUTANEOUS
  Filled 2019-09-17: qty 1.1

## 2019-09-17 NOTE — Patient Instructions (Addendum)
[place] at [facility] Discharge Instructions  You were seen today by Dr. Delton Coombes. He went over your recent lab results. He discussed starting you on Zometa again. He recommended you discuss this with your doctor at [facility] as well. Also talk to your doctor about transplant. He will see you back in 3 weeks for labs and follow up.   Thank you for choosing [place] at [facility] to provide your oncology and hematology care.  To afford each patient quality time with our provider, please arrive at least 15 minutes before your scheduled appointment time.   If you have a lab appointment with the [facility] please come in thru the  Main Entrance and check in at the main information desk  You need to re-schedule your appointment should you arrive 10 or more minutes late.  We strive to give you quality time with our providers, and arriving late affects you and other patients whose appointments are after yours.  Also, if you no show three or more times for appointments you may be dismissed from the clinic at the providers discretion.     Again, thank you for choosing [facility].  Our hope is that these requests will decrease the amount of time that you wait before being seen by our physicians.       _____________________________________________________________  Should you have questions after your visit to [facility], please contact our office at [phone] between the hours of 8:00 a.m. and 4:30 p.m.  Voicemails left after 4:00 p.m. will not be returned until the following business day.  For prescription refill requests, have your pharmacy contact our office and allow 72 hours.    Cancer Center Support Programs:   > Cancer Support Group  2nd Tuesday of the month 1pm-2pm, Journey Room

## 2019-09-17 NOTE — Progress Notes (Signed)
[name], [name] [address]   CLINIC:  Medical Oncology/Hematology  PCP:  [name], [place] [address] [phone]   REASON FOR VISIT:  Follow-up for monoclonal gammopathy.  CURRENT THERAPY: RVD    INTERVAL HISTORY:  Stephanie Galvan 64 y.o. female seen for follow-up of multiple myeloma.  She is tolerating Velcade very well.  She has started cycle 2 of Revlimid last week.  She denies any nausea vomiting or diarrhea.  Pain in the back is well controlled.  Appetite is 100%.  Energy levels are 75%.  Mild leg swelling is also stable.   REVIEW OF SYSTEMS:  Review of Systems  Cardiovascular: Positive for leg swelling.  Gastrointestinal: Positive for constipation.  Psychiatric/Behavioral: Positive for sleep disturbance.  All other systems reviewed and are negative.    PAST MEDICAL/SURGICAL HISTORY:  Past Medical History:  Diagnosis Date  . Breast cancer ([facility]) 2006   left breast  . Breast mass, right   . Compression fracture of cervical spine ([facility]) 06/18/2019   recent Chest xray stating had fx.  . High cholesterol   . History of bladder infections   . Hypertension   . Rash Aug 2013   Past Surgical History:  Procedure Laterality Date  . ABDOMINAL HYSTERECTOMY     just uterus removed  . BREAST BIOPSY    . BREAST LUMPECTOMY WITH RADIOACTIVE SEED LOCALIZATION Right 07/24/2018   Procedure: RIGHT BREAST LUMPECTOMY WITH RADIOACTIVE SEED LOCALIZATION;  Surgeon: Coralie Keens, MD;  Location: [facility];  Service: General;  Laterality: Right;  . MASTECTOMY  5/06   left  . PARTIAL HYSTERECTOMY    . PORT-A-CATH REMOVAL    . PORTACATH PLACEMENT       SOCIAL HISTORY:  Social History   Socioeconomic History  . Marital status: Married    Spouse name: Not on file  . Number of children: 2  . Years of education: Not on file  . Highest education level: Not on file  Occupational History   Employer: [employer]  Tobacco Use  . Smoking status: Never Smoker  . Smokeless tobacco: Never Used  Substance and Sexual Activity  . Alcohol use: No  . Drug use: No  . Sexual activity: Yes    Birth control/protection: Surgical  Other Topics Concern  . Not on file  Social History Narrative  . Not on file   Social Determinants of Health   Financial Resource Strain:   . Difficulty of Paying Living Expenses: Not on file  Food Insecurity:   . Worried About Charity fundraiser in the Last Year: Not on file  . Ran Out of Food in the Last Year: Not on file  Transportation Needs:   . Lack of Transportation (Medical): Not on file  . Lack of Transportation (Non-Medical): Not on file  Physical Activity:   . Days of Exercise per Week: Not on file  . Minutes of Exercise per Session: Not on file  Stress:   . Feeling of Stress : Not on file  Social Connections:   . Frequency of Communication with Friends and Family: Not on file  . Frequency of Social Gatherings with Friends and Family: Not on file  . Attends Religious Services: Not on file  . Active Member of Clubs or Organizations: Not on file  . Attends Archivist Meetings: Not on file  . Marital Status: Not on file  Intimate Partner Violence:   .  Fear of Current or Ex-Partner: Not on file  . Emotionally Abused: Not on file  . Physically Abused: Not on file  . Sexually Abused: Not on file    FAMILY HISTORY:  Family History  Problem Relation Age of Onset  . Dementia Mother   . Stomach cancer Father   . Cancer Brother   . Cancer Brother   . Diabetes Brother   . Colitis Daughter   . Hypertension Daughter   . Hypertension Daughter     CURRENT MEDICATIONS:  Outpatient Encounter Medications as of 09/17/2019  Medication Sig  . acyclovir (ZOVIRAX) 400 MG tablet Take 1 tablet (400 mg total) by mouth 2 (two) times daily.  Marland Kitchen atenolol (TENORMIN) 50 MG tablet Take 50 mg by mouth daily.    . bortezomib IV (VELCADE) 3.5 MG  injection Inject 2.75 mg into the vein once a week. Days 1, 8, 15 every 21 days   . cholecalciferol (VITAMIN D3) 25 MCG (1000 UT) tablet Take 1,000 Units by mouth daily.  Marland Kitchen dexamethasone (DECADRON) 4 MG tablet Take 10 tablets (40 mg) on days 1, 8, and 15 of chemo. Repeat every 21 days.  . ergocalciferol (VITAMIN D2) 1.25 MG (50000 UT) capsule Take 1 capsule (50,000 Units total) by mouth once a week.  . hydrochlorothiazide 25 MG tablet Take 25 mg by mouth daily.    Marland Kitchen lenalidomide (REVLIMID) 25 MG capsule Take one capsule daily on days 1-14 every 21 days.  . potassium chloride (K-DUR) 10 MEQ tablet Take 10 mEq by mouth daily.  . Aspirin-Salicylamide-Caffeine (BC HEADACHE POWDER PO) Take 1 tablet by mouth as needed.   . furosemide (LASIX) 20 MG tablet TAKE 1 TABLET BY MOUTH EVERY DAY AS NEEDED (Patient not taking: Reported on 09/17/2019)  . HYDROcodone-acetaminophen (NORCO) 10-325 MG tablet ORAL TAKE 1 TABLET UP TO 3 TIMES A DAY FOR SEVERE PAIN.  Marland Kitchen ibuprofen (ADVIL) 800 MG tablet Take 800 mg by mouth 3 (three) times daily.  . prochlorperazine (COMPAZINE) 10 MG tablet Take 1 tablet (10 mg total) by mouth every 6 (six) hours as needed for nausea or vomiting. (Patient not taking: Reported on 09/17/2019)   No facility-administered encounter medications on file as of 09/17/2019.    ALLERGIES:  No Known Allergies   PHYSICAL EXAM:  ECOG Performance status: 1  Vitals:   09/17/19 0839  BP: 136/75  Pulse: 71  Resp: 18  Temp: (!) 97.5 F (36.4 C)  SpO2: 100%   Filed Weights   09/17/19 0839  Weight: 177 lb 12.8 oz (80.6 kg)    Physical Exam Vitals reviewed.  Constitutional:      Appearance: Normal appearance.  Cardiovascular:     Rate and Rhythm: Normal rate and regular rhythm.     Heart sounds: Normal heart sounds.  Pulmonary:     Effort: Pulmonary effort is normal.     Breath sounds: Normal breath sounds.  Abdominal:     General: There is no distension.     Palpations: Abdomen is soft.  There is no mass.  Musculoskeletal:     Right lower leg: Edema present.     Left lower leg: Edema present.  Skin:    General: Skin is warm.  Neurological:     General: No focal deficit present.     Mental Status: She is alert and oriented to person, place, and time.  Psychiatric:        Mood and Affect: Mood normal.        Behavior:  Behavior normal.      LABORATORY DATA:  I have reviewed the labs as listed.  CBC    Component Value Date/Time   WBC 4.2 09/17/2019 0818   RBC 3.90 09/17/2019 0818   HGB 11.4 (L) 09/17/2019 0818   HCT 36.9 09/17/2019 0818   PLT 241 09/17/2019 0818   MCV 94.6 09/17/2019 0818   MCH 29.2 09/17/2019 0818   MCHC 30.9 09/17/2019 0818   RDW 14.1 09/17/2019 0818   LYMPHSABS 0.5 (L) 09/17/2019 0818   MONOABS 0.1 09/17/2019 0818   EOSABS 0.1 09/17/2019 0818   BASOSABS 0.0 09/17/2019 0818   CMP Latest Ref Rng & Units 09/17/2019 09/10/2019 09/03/2019  Glucose 70 - 99 mg/dL 91 90 104(H)  BUN 8 - 23 mg/dL 12 14 16   Creatinine 0.44 - 1.00 mg/dL 0.76 0.70 0.80  Sodium 135 - 145 mmol/L 138 138 136  Potassium 3.5 - 5.1 mmol/L 3.5 3.6 3.7  Chloride 98 - 111 mmol/L 103 101 99  CO2 22 - 32 mmol/L 25 25 25   Calcium 8.9 - 10.3 mg/dL 8.7(L) 8.9 8.8(L)  Total Protein 6.5 - 8.1 g/dL 7.2 7.7 8.2(H)  Total Bilirubin 0.3 - 1.2 mg/dL 0.4 0.5 0.6  Alkaline Phos 38 - 126 U/L 268(H) 230(H) 241(H)  AST 15 - 41 U/L 44(H) 21 42(H)  ALT 0 - 44 U/L 45(H) 29 53(H)       DIAGNOSTIC IMAGING:  I have independently reviewed the scans and discussed with the patient.     ASSESSMENT & PLAN:   Multiple myeloma ([facility]) 1.  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  Kappa light chain 16.1, lambda light chains 140.4, ratio 0.11.  Beta-2 microglobulin 3.8.  LDH was normal.  Creatinine and calcium are normal. - Skeletal survey on 06/02/2019 showed old L1 compression fracture.  Degenerative disc disease noted at L4-5 and  L5-S1.  No evidence of lytic lesions. -24-hour urine shows total protein increased at 190 mg.  Bence-Jones protein positive, lambda type.  Immunofixation was positive. -Bone density test on 07/21/2019 shows T score of -2.2, osteopenic range. -Bone marrow biopsy on 06/22/2019 shows 50% lambda restricted plasma cells.  Hypercellular marrow with trilineage hematopoiesis.  Chromosome analysis was 2, XX.  Myeloma FISH panel was normal. -Recently had worsening back pain.  MRI of the thoracic spine showed acute to subacute benign appearing compression fractures of T8 and T10 with mild to moderate loss of height.  Chronic T12 compression fracture.  MRI of the lumbar spine on 06/25/2019 showed acute to subacute L1 and L3 compression fractures with up to 50% central loss of the height. -Bone density test on 07/21/2019 shows T score of -2.2 consistent with osteopenia. -Because of her vertebral fractures, treatment for myeloma was recommended. -Cycle 1 of RVD on 08/19/2019. -Myeloma panel on 09/10/2019 showed M spike improved to 1.2 g.  Light chain ratio is 0.7.  Lambda light chains improved to 18.2. -I will proceed with cycle 2-day 1 treatment today.  She has started Revlimid last week.  She is tolerating it well. -She had mild elevation of AST and ALT.  We will continue to monitor it.  I will see her back in 3 weeks for follow-up. -She has an appointment to see bone marrow transplant specialist at [facility] on the 21st of this month.  2.  Back pain: -She reports improvement in her pain since we started treatment for myeloma. -She is not requiring  hydrocodone on a regular basis.  3.  Breast cancer: -Patient had a history of right breast cancer in 2000 and 02/2006.  She was reportedly treated with chemotherapy followed by 1 year of Herceptin.  4.  Bone protection: -We have talked to her again about the importance of bone protecting agents like zoledronic acid in the treatment of myeloma. -She reportedly switched  to new insurance.  She will start Medicare later part of the year and would like to wait until then.  I have stressed the importance of starting Zometa at this time.  She would like to think about it.  5.  Hypokalemia: -Potassium is 3.5 today.  She is taking potassium 20 mEq twice daily.  6.  ID prophylaxis: -She will continue acyclovir 400 mg twice daily. -She will also continue aspirin 81 mg for thromboprophylaxis.     Orders placed this encounter:  Orders Placed This Encounter  Procedures  . CBC with Differential/Platelet  . Comprehensive metabolic panel  . Protein electrophoresis, serum  . Kappa/lambda light chains  . Lactate dehydrogenase      Derek Jack, MD [place] [phone]

## 2019-09-17 NOTE — Progress Notes (Signed)
Patient presents today for Velcade injection and possible Zometa infusion. Labs pending. Vital signs are within parameters for treatment. Patient states her back pain has decreased significantly. Patient rates the pain 4/10. Patient expressed concerns about Zometa infusion.Patient wants to discuss with Dr. Delton Coombes the option of waiting until her Medicare is established. Patient teaching performed. Instructed patient to speak with Dr. Delton Coombes at appointment and address those concerns.   Message received from [facility] LPN . Patient is good for treatment. Labs reviewed . Per [facility] LPN. NO Zometa today. Velcade only.   Velcade given today per MD orders. Tolerated without adverse affects. Vital signs stable. No complaints at this time. Discharged from clinic ambulatory. F/U with [facility] as scheduled.

## 2019-09-17 NOTE — Patient Instructions (Signed)
[facility] Discharge Instructions for Patients Receiving Chemotherapy  Today you received the following chemotherapy agents   To help prevent nausea and vomiting after your treatment, we encourage you to take your nausea medication   If you develop nausea and vomiting that is not controlled by your nausea medication, call the clinic.   BELOW ARE SYMPTOMS THAT SHOULD BE REPORTED IMMEDIATELY:  *FEVER GREATER THAN 100.5 F  *CHILLS WITH OR WITHOUT FEVER  NAUSEA AND VOMITING THAT IS NOT CONTROLLED WITH YOUR NAUSEA MEDICATION  *UNUSUAL SHORTNESS OF BREATH  *UNUSUAL BRUISING OR BLEEDING  TENDERNESS IN MOUTH AND THROAT WITH OR WITHOUT PRESENCE OF ULCERS  *URINARY PROBLEMS  *BOWEL PROBLEMS  UNUSUAL RASH Items with * indicate a potential emergency and should be followed up as soon as possible.  Feel free to call the clinic should you have any questions or concerns. The clinic phone number is [phone].  Please show the CHEMO ALERT CARD at check-in to the Emergency Department and triage nurse.   

## 2019-09-17 NOTE — Assessment & Plan Note (Addendum)
1.  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  Kappa light chain 16.1, lambda light chains 140.4, ratio 0.11.  Beta-2 microglobulin 3.8.  LDH was normal.  Creatinine and calcium are normal. - Skeletal survey on 06/02/2019 showed old L1 compression fracture.  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  No evidence of lytic lesions. -24-hour urine shows total protein increased at 190 mg.  Bence-Jones protein positive, lambda type.  Immunofixation was positive. -Bone density test on 07/21/2019 shows T score of -2.2, osteopenic range. -Bone marrow biopsy on 06/22/2019 shows 50% lambda restricted plasma cells.  Hypercellular marrow with trilineage hematopoiesis.  Chromosome analysis was 50, XX.  Myeloma FISH panel was normal. -Recently had worsening back pain.  MRI of the thoracic spine showed acute to subacute benign appearing compression fractures of T8 and T10 with mild to moderate loss of height.  Chronic T12 compression fracture.  MRI of the lumbar spine on 06/25/2019 showed acute to subacute L1 and L3 compression fractures with up to 50% central loss of the height. -Bone density test on 07/21/2019 shows T score of -2.2 consistent with osteopenia. -Because of her vertebral fractures, treatment for myeloma was recommended. -Cycle 1 of RVD on 08/19/2019. -Myeloma panel on 09/10/2019 showed M spike improved to 1.2 g.  Light chain ratio is 0.7.  Lambda light chains improved to 18.2. -I will proceed with cycle 2-day 1 treatment today.  She has started Revlimid last week.  She is tolerating it well. -She had mild elevation of AST and ALT.  We will continue to monitor it.  I will see her back in 3 weeks for follow-up. -She has an appointment to see bone marrow transplant specialist at [facility] on the 21st of this month.  2.  Back pain: -She reports improvement in her pain since we started treatment for myeloma. -She is not requiring hydrocodone on  a regular basis.  3.  Breast cancer: -Patient had a history of right breast cancer in 2000 and 02/2006.  She was reportedly treated with chemotherapy followed by 1 year of Herceptin.  4.  Bone protection: -We have talked to her again about the importance of bone protecting agents like zoledronic acid in the treatment of myeloma. -She reportedly switched to new insurance.  She will start Medicare later part of the year and would like to wait until then.  I have stressed the importance of starting Zometa at this time.  She would like to think about it.  5.  Hypokalemia: -Potassium is 3.5 today.  She is taking potassium 20 mEq twice daily.  6.  ID prophylaxis: -She will continue acyclovir 400 mg twice daily. -She will also continue aspirin 81 mg for thromboprophylaxis.

## 2019-09-21 ENCOUNTER — Encounter (HOSPITAL_COMMUNITY): Payer: Self-pay | Admitting: Hematology

## 2019-09-22 ENCOUNTER — Other Ambulatory Visit (HOSPITAL_COMMUNITY): Payer: Self-pay | Admitting: *Deleted

## 2019-09-22 ENCOUNTER — Other Ambulatory Visit (HOSPITAL_COMMUNITY): Payer: Self-pay | Admitting: Nurse Practitioner

## 2019-09-22 DIAGNOSIS — D472 Monoclonal gammopathy: Secondary | ICD-10-CM

## 2019-09-22 MED ORDER — LENALIDOMIDE 25 MG PO CAPS: ORAL_CAPSULE | ORAL | 0 refills | Status: DC

## 2019-09-24 ENCOUNTER — Inpatient Hospital Stay (HOSPITAL_COMMUNITY): Payer: BC Managed Care – PPO

## 2019-09-24 ENCOUNTER — Other Ambulatory Visit: Payer: Self-pay

## 2019-09-24 ENCOUNTER — Encounter (HOSPITAL_COMMUNITY): Payer: Self-pay

## 2019-09-24 VITALS — BP 123/68 | HR 70 | Temp 97.6°F | Resp 18 | Wt 177.6 lb

## 2019-09-24 DIAGNOSIS — D472 Monoclonal gammopathy: Secondary | ICD-10-CM

## 2019-09-24 DIAGNOSIS — C9000 Multiple myeloma not having achieved remission: Secondary | ICD-10-CM | POA: Diagnosis not present

## 2019-09-24 LAB — CBC WITH DIFFERENTIAL/PLATELET
Abs Immature Granulocytes: 0 10*3/uL (ref 0.00–0.07)
Basophils Absolute: 0 10*3/uL (ref 0.0–0.1)
Basophils Relative: 1 %
Eosinophils Absolute: 0.3 10*3/uL (ref 0.0–0.5)
Eosinophils Relative: 7 %
HCT: 35.3 % — ABNORMAL LOW (ref 36.0–46.0)
Hemoglobin: 10.9 g/dL — ABNORMAL LOW (ref 12.0–15.0)
Immature Granulocytes: 0 %
Lymphocytes Relative: 16 %
Lymphs Abs: 0.6 10*3/uL — ABNORMAL LOW (ref 0.7–4.0)
MCH: 29 pg (ref 26.0–34.0)
MCHC: 30.9 g/dL (ref 30.0–36.0)
MCV: 93.9 fL (ref 80.0–100.0)
Monocytes Absolute: 0.4 10*3/uL (ref 0.1–1.0)
Monocytes Relative: 10 %
Neutro Abs: 2.4 10*3/uL (ref 1.7–7.7)
Neutrophils Relative %: 66 %
Platelets: 209 10*3/uL (ref 150–400)
RBC: 3.76 MIL/uL — ABNORMAL LOW (ref 3.87–5.11)
RDW: 14.2 % (ref 11.5–15.5)
WBC: 3.6 10*3/uL — ABNORMAL LOW (ref 4.0–10.5)
nRBC: 0 % (ref 0.0–0.2)

## 2019-09-24 LAB — COMPREHENSIVE METABOLIC PANEL
ALT: 45 U/L — ABNORMAL HIGH (ref 0–44)
AST: 38 U/L (ref 15–41)
Albumin: 3.3 g/dL — ABNORMAL LOW (ref 3.5–5.0)
Alkaline Phosphatase: 247 U/L — ABNORMAL HIGH (ref 38–126)
Anion gap: 7 (ref 5–15)
BUN: 12 mg/dL (ref 8–23)
CO2: 27 mmol/L (ref 22–32)
Calcium: 8.3 mg/dL — ABNORMAL LOW (ref 8.9–10.3)
Chloride: 104 mmol/L (ref 98–111)
Creatinine, Ser: 0.74 mg/dL (ref 0.44–1.00)
GFR calc Af Amer: >60 mL/min (ref >60)
GFR calc non Af Amer: >60 mL/min (ref >60)
Glucose, Bld: 86 mg/dL (ref 70–99)
Potassium: 3.3 mmol/L — ABNORMAL LOW (ref 3.5–5.1)
Sodium: 138 mmol/L (ref 135–145)
Total Bilirubin: 0.7 mg/dL (ref 0.3–1.2)
Total Protein: 6.7 g/dL (ref 6.5–8.1)

## 2019-09-24 MED ORDER — BORTEZOMIB CHEMO SQ INJECTION 3.5 MG (2.5MG/ML)
1.3000 mg/m2 | Freq: Once | INTRAMUSCULAR | Status: AC
  Administered 2019-09-24: 2.75 mg via SUBCUTANEOUS
  Filled 2019-09-24: qty 1.1

## 2019-09-24 MED ORDER — PROCHLORPERAZINE MALEATE 10 MG PO TABS: 10.0000 mg | ORAL_TABLET | Freq: Once | ORAL | Status: DC

## 2019-09-24 NOTE — Patient Instructions (Signed)
[facility] Discharge Instructions for Patients Receiving Chemotherapy  Today you received the following chemotherapy agents   To help prevent nausea and vomiting after your treatment, we encourage you to take your nausea medication   If you develop nausea and vomiting that is not controlled by your nausea medication, call the clinic.   BELOW ARE SYMPTOMS THAT SHOULD BE REPORTED IMMEDIATELY:  *FEVER GREATER THAN 100.5 F  *CHILLS WITH OR WITHOUT FEVER  NAUSEA AND VOMITING THAT IS NOT CONTROLLED WITH YOUR NAUSEA MEDICATION  *UNUSUAL SHORTNESS OF BREATH  *UNUSUAL BRUISING OR BLEEDING  TENDERNESS IN MOUTH AND THROAT WITH OR WITHOUT PRESENCE OF ULCERS  *URINARY PROBLEMS  *BOWEL PROBLEMS  UNUSUAL RASH Items with * indicate a potential emergency and should be followed up as soon as possible.  Feel free to call the clinic should you have any questions or concerns. The clinic phone number is [phone].  Please show the CHEMO ALERT CARD at check-in to the Emergency Department and triage nurse.   

## 2019-09-24 NOTE — Progress Notes (Signed)
Labs reviewed today. They meet parameters for treatment. Patient has no new concerns or issues since last treatment. Proceed per protocol.   Treatment given per orders. Patient tolerated it well without problems. Vitals stable and discharged home from clinic ambulatory. Follow up as scheduled.

## 2019-09-29 ENCOUNTER — Encounter (HOSPITAL_COMMUNITY): Payer: Self-pay | Admitting: *Deleted

## 2019-09-29 NOTE — Progress Notes (Signed)
Patient called clinic to advise that she has not received her revlimid shipment yet.  I advised her to call the pharmacy and ask about shipment.  I advised her to have them call us should there be anything that we can do to expedite getting her medicine. She verbalizes understanding.

## 2019-10-01 ENCOUNTER — Inpatient Hospital Stay (HOSPITAL_COMMUNITY): Payer: BC Managed Care – PPO

## 2019-10-01 ENCOUNTER — Other Ambulatory Visit: Payer: Self-pay

## 2019-10-01 ENCOUNTER — Telehealth (HOSPITAL_COMMUNITY): Payer: Self-pay | Admitting: Pharmacy Technician

## 2019-10-01 ENCOUNTER — Encounter (HOSPITAL_COMMUNITY): Payer: Self-pay

## 2019-10-01 VITALS — BP 129/68 | HR 71 | Temp 97.1°F | Resp 18 | Wt 177.6 lb

## 2019-10-01 DIAGNOSIS — D472 Monoclonal gammopathy: Secondary | ICD-10-CM

## 2019-10-01 DIAGNOSIS — C9000 Multiple myeloma not having achieved remission: Secondary | ICD-10-CM | POA: Diagnosis not present

## 2019-10-01 LAB — COMPREHENSIVE METABOLIC PANEL
ALT: 22 U/L (ref 0–44)
AST: 20 U/L (ref 15–41)
Albumin: 3.6 g/dL (ref 3.5–5.0)
Alkaline Phosphatase: 241 U/L — ABNORMAL HIGH (ref 38–126)
Anion gap: 7 (ref 5–15)
BUN: 12 mg/dL (ref 8–23)
CO2: 28 mmol/L (ref 22–32)
Calcium: 8.7 mg/dL — ABNORMAL LOW (ref 8.9–10.3)
Chloride: 104 mmol/L (ref 98–111)
Creatinine, Ser: 0.73 mg/dL (ref 0.44–1.00)
GFR calc Af Amer: >60 mL/min (ref >60)
GFR calc non Af Amer: >60 mL/min (ref >60)
Glucose, Bld: 75 mg/dL (ref 70–99)
Potassium: 4.1 mmol/L (ref 3.5–5.1)
Sodium: 139 mmol/L (ref 135–145)
Total Bilirubin: 0.6 mg/dL (ref 0.3–1.2)
Total Protein: 6.9 g/dL (ref 6.5–8.1)

## 2019-10-01 LAB — CBC WITH DIFFERENTIAL/PLATELET
Abs Immature Granulocytes: 0 10*3/uL (ref 0.00–0.07)
Basophils Absolute: 0 10*3/uL (ref 0.0–0.1)
Basophils Relative: 0 %
Eosinophils Absolute: 0.1 10*3/uL (ref 0.0–0.5)
Eosinophils Relative: 3 %
HCT: 37.6 % (ref 36.0–46.0)
Hemoglobin: 11.8 g/dL — ABNORMAL LOW (ref 12.0–15.0)
Immature Granulocytes: 0 %
Lymphocytes Relative: 16 %
Lymphs Abs: 0.5 10*3/uL — ABNORMAL LOW (ref 0.7–4.0)
MCH: 29.5 pg (ref 26.0–34.0)
MCHC: 31.4 g/dL (ref 30.0–36.0)
MCV: 94 fL (ref 80.0–100.0)
Monocytes Absolute: 0.2 10*3/uL (ref 0.1–1.0)
Monocytes Relative: 7 %
Neutro Abs: 2.4 10*3/uL (ref 1.7–7.7)
Neutrophils Relative %: 74 %
Platelets: 244 10*3/uL (ref 150–400)
RBC: 4 MIL/uL (ref 3.87–5.11)
RDW: 14.6 % (ref 11.5–15.5)
WBC: 3.2 10*3/uL — ABNORMAL LOW (ref 4.0–10.5)
nRBC: 0 % (ref 0.0–0.2)

## 2019-10-01 MED ORDER — PROCHLORPERAZINE MALEATE 10 MG PO TABS: 10.0000 mg | ORAL_TABLET | Freq: Once | ORAL | Status: DC

## 2019-10-01 MED ORDER — ASPIRIN EC 81 MG PO TBEC: 81.0000 mg | DELAYED_RELEASE_TABLET | Freq: Every day | ORAL | Status: DC

## 2019-10-01 MED ORDER — BORTEZOMIB CHEMO SQ INJECTION 3.5 MG (2.5MG/ML)
1.3000 mg/m2 | Freq: Once | INTRAMUSCULAR | Status: AC
  Administered 2019-10-01: 11:00:00 2.75 mg via SUBCUTANEOUS
  Filled 2019-10-01: qty 1.1

## 2019-10-01 NOTE — Telephone Encounter (Signed)
Oral Oncology Patient Advocate Encounter  Faxed signed application to [phone].  I will continue to update this encounter as new information becomes available.  [place] Patient [place] Phone [phone] Fax [phone] 10/01/2019 2:24 PM

## 2019-10-01 NOTE — Patient Instructions (Signed)
[facility] Discharge Instructions for Patients Receiving Chemotherapy  Today you received the following chemotherapy agents   To help prevent nausea and vomiting after your treatment, we encourage you to take your nausea medication   If you develop nausea and vomiting that is not controlled by your nausea medication, call the clinic.   BELOW ARE SYMPTOMS THAT SHOULD BE REPORTED IMMEDIATELY:  *FEVER GREATER THAN 100.5 F  *CHILLS WITH OR WITHOUT FEVER  NAUSEA AND VOMITING THAT IS NOT CONTROLLED WITH YOUR NAUSEA MEDICATION  *UNUSUAL SHORTNESS OF BREATH  *UNUSUAL BRUISING OR BLEEDING  TENDERNESS IN MOUTH AND THROAT WITH OR WITHOUT PRESENCE OF ULCERS  *URINARY PROBLEMS  *BOWEL PROBLEMS  UNUSUAL RASH Items with * indicate a potential emergency and should be followed up as soon as possible.  Feel free to call the clinic should you have any questions or concerns. The clinic phone number is [phone].  Please show the CHEMO ALERT CARD at check-in to the Emergency Department and triage nurse.   

## 2019-10-01 NOTE — Telephone Encounter (Signed)
Oral Oncology Patient Advocate Encounter  Received a message from Jene Every, RN, at [facility] regarding copay assistance for Revlimid.  Patient has a high copay and is need of a copay card.  She only has 2 days of medication left.    I have filled out an application from [employer] to enroll patient in a copay card.  I emailed Mrs Bui the application to sign.  She will sign and send back to me when she gets home.  [place] Patient [place] Phone [phone] Fax [phone] 10/01/2019 11:31 AM

## 2019-10-01 NOTE — Progress Notes (Signed)
Labs reviewed today and meet treatment parameters.   Treatment given per orders. Patient tolerated it well without problems. Vitals stable and discharged home from clinic ambulatory. Follow up as scheduled.

## 2019-10-02 NOTE — Telephone Encounter (Signed)
Application was delayed due to typo in name.  Aniceto Boss, patient support rep, sent me an email that it would be quicker for the patient to call [employer] and complete enrollment over the phone for instant approval.    Patient called to enroll but they said they could not find her in the system.  I called and spoke to another rep who transferred me to another department and they could not help me.  I called back and spoke to a rep and she stated the patient just needed to call again to apply.    I called and spoke with patient and told her to call back and enroll in the copay card and to not mention that an application was submitted.  She is going to call them back once more to see if she can get enrolled in a copay card.  [place] Patient [place] Phone [phone] Fax [phone] 10/02/2019 2:47 PM

## 2019-10-05 NOTE — Telephone Encounter (Signed)
Oral Oncology Patient Advocate Encounter   Was successful in obtaining a copay card for Revlimid.  This copay card will make the patients copay $25.00.  I have spoken with the patient.    The billing information is as follows:   RxBin: [id number] PCN: Loyalty Member ID: [id number] Group ID: [id number]   Stephanie Galvan Patient [place] Phone [phone] Fax [phone] 10/05/2019 9:09 AM

## 2019-10-06 ENCOUNTER — Encounter (HOSPITAL_COMMUNITY): Payer: Self-pay | Admitting: *Deleted

## 2019-10-06 NOTE — Progress Notes (Signed)
Patient called with concerns about starting her Revlimid today when she gets it, she recalls having a conversation with Dr. Alvie Heidelberg at [facility] and her saying that a different pill would be prescribed.    I called and spoke with Catalina Antigua, RN in triage at Dr. Kendell Bane clinic. He confirmed with Dr. Alvie Heidelberg herself and they did talk about cytoxan but only if the Revlimid was not obtainable or affordable.  She said since patient is getting Revlimid today, there is no need in changing therapy.  Patient is to start taking the Revlimid as soon as she gets it in the mail.  I returned the call to the patient and explained the above.  She verbalizes recalling that conversation at this time. She understands to start taking the Revlimid when she gets it.  She was advised to call our office should she have any more questions or concerns.

## 2019-10-08 ENCOUNTER — Inpatient Hospital Stay (HOSPITAL_COMMUNITY): Payer: BC Managed Care – PPO

## 2019-10-08 ENCOUNTER — Encounter (HOSPITAL_COMMUNITY): Payer: Self-pay | Admitting: Hematology

## 2019-10-08 ENCOUNTER — Ambulatory Visit (HOSPITAL_COMMUNITY): Payer: BC Managed Care – PPO | Admitting: Hematology

## 2019-10-08 ENCOUNTER — Other Ambulatory Visit: Payer: Self-pay

## 2019-10-08 ENCOUNTER — Other Ambulatory Visit (HOSPITAL_COMMUNITY): Payer: Self-pay | Admitting: *Deleted

## 2019-10-08 ENCOUNTER — Ambulatory Visit (HOSPITAL_COMMUNITY): Payer: BC Managed Care – PPO

## 2019-10-08 ENCOUNTER — Inpatient Hospital Stay (HOSPITAL_BASED_OUTPATIENT_CLINIC_OR_DEPARTMENT_OTHER): Payer: BC Managed Care – PPO | Admitting: Hematology

## 2019-10-08 ENCOUNTER — Other Ambulatory Visit (HOSPITAL_COMMUNITY): Payer: BC Managed Care – PPO

## 2019-10-08 DIAGNOSIS — C9000 Multiple myeloma not having achieved remission: Secondary | ICD-10-CM

## 2019-10-08 DIAGNOSIS — D472 Monoclonal gammopathy: Secondary | ICD-10-CM

## 2019-10-08 LAB — LACTATE DEHYDROGENASE: LDH: 127 U/L (ref 98–192)

## 2019-10-08 LAB — COMPREHENSIVE METABOLIC PANEL
ALT: 14 U/L (ref 0–44)
AST: 15 U/L (ref 15–41)
Albumin: 3.8 g/dL (ref 3.5–5.0)
Alkaline Phosphatase: 181 U/L — ABNORMAL HIGH (ref 38–126)
Anion gap: 9 (ref 5–15)
BUN: 16 mg/dL (ref 8–23)
CO2: 28 mmol/L (ref 22–32)
Calcium: 9 mg/dL (ref 8.9–10.3)
Chloride: 99 mmol/L (ref 98–111)
Creatinine, Ser: 0.76 mg/dL (ref 0.44–1.00)
GFR calc Af Amer: >60 mL/min (ref >60)
GFR calc non Af Amer: >60 mL/min (ref >60)
Glucose, Bld: 99 mg/dL (ref 70–99)
Potassium: 3.2 mmol/L — ABNORMAL LOW (ref 3.5–5.1)
Sodium: 136 mmol/L (ref 135–145)
Total Bilirubin: 0.8 mg/dL (ref 0.3–1.2)
Total Protein: 6.9 g/dL (ref 6.5–8.1)

## 2019-10-08 LAB — CBC WITH DIFFERENTIAL/PLATELET
Abs Immature Granulocytes: 0.01 10*3/uL (ref 0.00–0.07)
Basophils Absolute: 0 10*3/uL (ref 0.0–0.1)
Basophils Relative: 1 %
Eosinophils Absolute: 0.2 10*3/uL (ref 0.0–0.5)
Eosinophils Relative: 6 %
HCT: 35.8 % — ABNORMAL LOW (ref 36.0–46.0)
Hemoglobin: 11.5 g/dL — ABNORMAL LOW (ref 12.0–15.0)
Immature Granulocytes: 0 %
Lymphocytes Relative: 23 %
Lymphs Abs: 0.8 10*3/uL (ref 0.7–4.0)
MCH: 29.8 pg (ref 26.0–34.0)
MCHC: 32.1 g/dL (ref 30.0–36.0)
MCV: 92.7 fL (ref 80.0–100.0)
Monocytes Absolute: 0.4 10*3/uL (ref 0.1–1.0)
Monocytes Relative: 11 %
Neutro Abs: 2 10*3/uL (ref 1.7–7.7)
Neutrophils Relative %: 59 %
Platelets: 221 10*3/uL (ref 150–400)
RBC: 3.86 MIL/uL — ABNORMAL LOW (ref 3.87–5.11)
RDW: 14.6 % (ref 11.5–15.5)
WBC: 3.3 10*3/uL — ABNORMAL LOW (ref 4.0–10.5)
nRBC: 0.6 % — ABNORMAL HIGH (ref 0.0–0.2)

## 2019-10-08 MED ORDER — BORTEZOMIB CHEMO SQ INJECTION 3.5 MG (2.5MG/ML)
1.3000 mg/m2 | Freq: Once | INTRAMUSCULAR | Status: AC
  Administered 2019-10-08: 2.75 mg via SUBCUTANEOUS
  Filled 2019-10-08: qty 1.1

## 2019-10-08 MED ORDER — PROCHLORPERAZINE MALEATE 10 MG PO TABS: 10.0000 mg | ORAL_TABLET | Freq: Once | ORAL | Status: DC

## 2019-10-08 NOTE — Patient Instructions (Signed)
[facility] Discharge Instructions for Patients Receiving Chemotherapy   Beginning January 23rd 2017 lab work for the Cancer Center will be done in the  Main lab at [place] [facility] on 1st floor. If you have a lab appointment with the Cancer Center please come in thru the  Main Entrance and check in at the main information desk   Today you received the following chemotherapy agents Velcade  To help prevent nausea and vomiting after your treatment, we encourage you to take your nausea medication    If you develop nausea and vomiting, or diarrhea that is not controlled by your medication, call the clinic.  The clinic phone number is [phone]. Office hours are Monday-Friday 8:30am-5:00pm.  BELOW ARE SYMPTOMS THAT SHOULD BE REPORTED IMMEDIATELY:  *FEVER GREATER THAN 101.0 F  *CHILLS WITH OR WITHOUT FEVER  NAUSEA AND VOMITING THAT IS NOT CONTROLLED WITH YOUR NAUSEA MEDICATION  *UNUSUAL SHORTNESS OF BREATH  *UNUSUAL BRUISING OR BLEEDING  TENDERNESS IN MOUTH AND THROAT WITH OR WITHOUT PRESENCE OF ULCERS  *URINARY PROBLEMS  *BOWEL PROBLEMS  UNUSUAL RASH Items with * indicate a potential emergency and should be followed up as soon as possible. If you have an emergency after office hours please contact your primary care physician or go to the nearest emergency department.  Please call the clinic during office hours if you have any questions or concerns.   You may also contact the Patient Navigator at [phone] should you have any questions or need assistance in obtaining follow up care.      Resources For Cancer Patients and their Caregivers ? American Cancer Society: Can assist with transportation, wigs, general needs, runs Look Good Feel Better.        1-888-227-6333 ? Cancer Care: Provides financial assistance, online support groups, medication/co-pay assistance.  1-800-813-HOPE (4673) ? [facility] Assists [place] cancer  patients and their families through emotional , educational and financial support.  336-427-4357 ? [place] DSS Where to apply for food stamps, Medicaid and utility assistance. 336-342-1394 ? [facility]: Transportation to medical appointments. 336-347-2287 ? Social Security Administration: May apply for disability if have a Stage IV cancer. 336-342-7796 1-800-772-1213 ? [place] Aging, Disability and Transit Services: Assists with nutrition, care and transit needs. 336-349-2343          

## 2019-10-08 NOTE — Patient Instructions (Addendum)
[place] at [facility] Discharge Instructions  You were seen today by Dr. Delton Coombes. He went over your recent lab results. Continue Velcade and Revlimid. He will see you back in 3 weeks for labs and follow up.   Thank you for choosing [place] at [facility] to provide your oncology and hematology care.  To afford each patient quality time with our provider, please arrive at least 15 minutes before your scheduled appointment time.   If you have a lab appointment with the [facility] please come in thru the  Main Entrance and check in at the main information desk  You need to re-schedule your appointment should you arrive 10 or more minutes late.  We strive to give you quality time with our providers, and arriving late affects you and other patients whose appointments are after yours.  Also, if you no show three or more times for appointments you may be dismissed from the clinic at the providers discretion.     Again, thank you for choosing [facility].  Our hope is that these requests will decrease the amount of time that you wait before being seen by our physicians.       _____________________________________________________________  Should you have questions after your visit to [facility], please contact our office at [phone] between the hours of 8:00 a.m. and 4:30 p.m.  Voicemails left after 4:00 p.m. will not be returned until the following business day.  For prescription refill requests, have your pharmacy contact our office and allow 72 hours.    Cancer Center Support Programs:   > Cancer Support Group  2nd Tuesday of the month 1pm-2pm, Journey Room

## 2019-10-08 NOTE — Progress Notes (Signed)
Stephanie Galvan, Stephanie Galvan [id number]   CLINIC:  Medical Oncology/Hematology  PCP:  [name], MD [place] [address] [phone]   REASON FOR VISIT:  Follow-up for monoclonal gammopathy.  CURRENT THERAPY: RVD    INTERVAL HISTORY:  [name] 64 y.o. female for follow-up of multiple myeloma.  She received her Revlimid and started taking it on 10/06/2019.  Reports some soreness in one of her lower jaw tooth.  Appetite and energy levels are 100%.  Pain in the lower back is reported as 5 out of 10.  She is taking hydrocodone twice daily as needed.  She had diarrhea for 1 day over the weekend up to 5 times.  Denies any tingling or numbness in the extremities.  REVIEW OF SYSTEMS:  Review of Systems  Gastrointestinal: Positive for diarrhea.  Psychiatric/Behavioral: Positive for sleep disturbance.  All other systems reviewed and are negative.    PAST MEDICAL/SURGICAL HISTORY:  Past Medical History:  Diagnosis Date  . Breast cancer ([place]) 2006   left breast  . Breast mass, right   . Compression fracture of cervical spine ([place]) 06/18/2019   recent Chest xray stating had fx.  . High cholesterol   . History of bladder infections   . Hypertension   . Rash Aug 2013   Past Surgical History:  Procedure Laterality Date  . ABDOMINAL HYSTERECTOMY     just uterus removed  . BREAST BIOPSY    . BREAST LUMPECTOMY WITH RADIOACTIVE SEED LOCALIZATION Right 07/24/2018   Procedure: RIGHT BREAST LUMPECTOMY WITH RADIOACTIVE SEED LOCALIZATION;  Surgeon: Coralie Keens, MD;  Location: [facility];  Service: General;  Laterality: Right;  . MASTECTOMY  5/06   left  . PARTIAL HYSTERECTOMY    . PORT-A-CATH REMOVAL    . PORTACATH PLACEMENT       SOCIAL HISTORY:  Social History   Socioeconomic History  . Marital status: Married    Spouse name: Not on file  . Number of children: 2  . Years of education: Not on file  .  Highest education level: Not on file  Occupational History    Employer: [employer]  Tobacco Use  . Smoking status: Never Smoker  . Smokeless tobacco: Never Used  Substance and Sexual Activity  . Alcohol use: No  . Drug use: No  . Sexual activity: Yes    Birth control/protection: Surgical  Other Topics Concern  . Not on file  Social History Narrative  . Not on file   Social Determinants of Health   Financial Resource Strain:   . Difficulty of Paying Living Expenses: Not on file  Food Insecurity:   . Worried About Charity fundraiser in the Last Year: Not on file  . Ran Out of Food in the Last Year: Not on file  Transportation Needs:   . Lack of Transportation (Medical): Not on file  . Lack of Transportation (Non-Medical): Not on file  Physical Activity:   . Days of Exercise per Week: Not on file  . Minutes of Exercise per Session: Not on file  Stress:   . Feeling of Stress : Not on file  Social Connections:   . Frequency of Communication with Friends and Family: Not on file  . Frequency of Social Gatherings with Friends and Family: Not on file  . Attends Religious Services: Not on file  . Active Member of Clubs or Organizations: Not on file  . Attends Club  or Organization Meetings: Not on file  . Marital Status: Not on file  Intimate Partner Violence:   . Fear of Current or Ex-Partner: Not on file  . Emotionally Abused: Not on file  . Physically Abused: Not on file  . Sexually Abused: Not on file    FAMILY HISTORY:  Family History  Problem Relation Age of Onset  . Dementia Mother   . Stomach cancer Father   . Cancer Brother   . Cancer Brother   . Diabetes Brother   . Colitis Daughter   . Hypertension Daughter   . Hypertension Daughter     CURRENT MEDICATIONS:  Outpatient Encounter Medications as of 10/08/2019  Medication Sig  . acyclovir (ZOVIRAX) 400 MG tablet Take 1 tablet (400 mg total) by mouth 2 (two) times daily.  Marland Kitchen aspirin EC 81 MG tablet Take 1  tablet (81 mg total) by mouth daily.  Marland Kitchen atenolol (TENORMIN) 50 MG tablet Take 50 mg by mouth daily.    . bortezomib IV (VELCADE) 3.5 MG injection Inject 2.75 mg into the vein once a week. Days 1, 8, 15 every 21 days   . dexamethasone (DECADRON) 4 MG tablet Take 10 tablets (40 mg) on days 1, 8, and 15 of chemo. Repeat every 21 days.  . ergocalciferol (VITAMIN D2) 1.25 MG (50000 UT) capsule Take 1 capsule (50,000 Units total) by mouth once a week.  . hydrochlorothiazide 25 MG tablet Take 25 mg by mouth daily.    Marland Kitchen lenalidomide (REVLIMID) 25 MG capsule Take one capsule daily on days 1-14 every 21 days.  . potassium chloride (K-DUR) 10 MEQ tablet Take 10 mEq by mouth daily.  . Aspirin-Salicylamide-Caffeine (BC HEADACHE POWDER PO) Take 1 tablet by mouth as needed.   . furosemide (LASIX) 20 MG tablet TAKE 1 TABLET BY MOUTH EVERY DAY AS NEEDED (Patient not taking: Reported on 10/08/2019)  . HYDROcodone-acetaminophen (NORCO) 10-325 MG tablet ORAL TAKE 1 TABLET UP TO 3 TIMES A DAY FOR SEVERE PAIN.  Marland Kitchen prochlorperazine (COMPAZINE) 10 MG tablet Take 1 tablet (10 mg total) by mouth every 6 (six) hours as needed for nausea or vomiting. (Patient not taking: Reported on 09/17/2019)  . [DISCONTINUED] cholecalciferol (VITAMIN D3) 25 MCG (1000 UT) tablet Take 1,000 Units by mouth daily.  . [DISCONTINUED] ibuprofen (ADVIL) 800 MG tablet Take 800 mg by mouth 3 (three) times daily.   No facility-administered encounter medications on file as of 10/08/2019.    ALLERGIES:  No Known Allergies   PHYSICAL EXAM:  ECOG Performance status: 1  Vitals:   10/08/19 1059  BP: 134/88  Pulse: 83  Resp: 18  Temp: (!) 97.5 F (36.4 C)  SpO2: 100%   Filed Weights   10/08/19 1059  Weight: 173 lb (78.5 kg)    Physical Exam Vitals reviewed.  Constitutional:      Appearance: Normal appearance.  Cardiovascular:     Rate and Rhythm: Normal rate and regular rhythm.     Heart sounds: Normal heart sounds.  Pulmonary:      Effort: Pulmonary effort is normal.     Breath sounds: Normal breath sounds.  Abdominal:     General: There is no distension.     Palpations: Abdomen is soft. There is no mass.  Musculoskeletal:     Right lower leg: Edema present.     Left lower leg: Edema present.  Skin:    General: Skin is warm.  Neurological:     General: No focal deficit present.  Mental Status: She is alert and oriented to person, place, and time.  Psychiatric:        Mood and Affect: Mood normal.        Behavior: Behavior normal.      LABORATORY DATA:  I have reviewed the labs as listed.  CBC    Component Value Date/Time   WBC 3.3 (L) 10/08/2019 1023   RBC 3.86 (L) 10/08/2019 1023   HGB 11.5 (L) 10/08/2019 1023   HCT 35.8 (L) 10/08/2019 1023   PLT 221 10/08/2019 1023   MCV 92.7 10/08/2019 1023   MCH 29.8 10/08/2019 1023   MCHC 32.1 10/08/2019 1023   RDW 14.6 10/08/2019 1023   LYMPHSABS 0.8 10/08/2019 1023   MONOABS 0.4 10/08/2019 1023   EOSABS 0.2 10/08/2019 1023   BASOSABS 0.0 10/08/2019 1023   CMP Latest Ref Rng & Units 10/08/2019 10/01/2019 09/24/2019  Glucose 70 - 99 mg/dL 99 75 86  BUN 8 - 23 mg/dL 16 12 12   Creatinine 0.44 - 1.00 mg/dL 0.76 0.73 0.74  Sodium 135 - 145 mmol/L 136 139 138  Potassium 3.5 - 5.1 mmol/L 3.2(L) 4.1 3.3(L)  Chloride 98 - 111 mmol/L 99 104 104  CO2 22 - 32 mmol/L 28 28 27   Calcium 8.9 - 10.3 mg/dL 9.0 8.7(L) 8.3(L)  Total Protein 6.5 - 8.1 g/dL 6.9 6.9 6.7  Total Bilirubin 0.3 - 1.2 mg/dL 0.8 0.6 0.7  Alkaline Phos 38 - 126 U/L 181(H) 241(H) 247(H)  AST 15 - 41 U/L 15 20 38  ALT 0 - 44 U/L 14 22 45(H)       DIAGNOSTIC IMAGING:  I have independently reviewed the scans and discussed with the patient.     ASSESSMENT & PLAN:   Multiple myeloma (HCC) 1.  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  Kappa light chain 16.1, lambda light chains 140.4, ratio 0.11.  Beta-2 microglobulin 3.8.  LDH was  normal.  Creatinine and calcium are normal. - Skeletal survey on 06/02/2019 showed old L1 compression fracture.  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  No evidence of lytic lesions. -24-hour urine shows total protein increased at 190 mg.  Bence-Jones protein positive, lambda type.  Immunofixation was positive. -Bone density test on 07/21/2019 shows T score of -2.2, osteopenic range. -Bone marrow biopsy on 06/22/2019 shows 50% lambda restricted plasma cells.  Hypercellular marrow with trilineage hematopoiesis.  Chromosome analysis was 54, XX.  Myeloma FISH panel was normal. -Recently had worsening back pain.  MRI of the thoracic spine showed acute to subacute benign appearing compression fractures of T8 and T10 with mild to moderate loss of height.  Chronic T12 compression fracture.  MRI of the lumbar spine on 06/25/2019 showed acute to subacute L1 and L3 compression fractures with up to 50% central loss of the height. -Bone density test on 07/21/2019 shows T score of -2.2 consistent with osteopenia. -Because of her vertebral fractures, treatment for myeloma was recommended. -Cycle 1 of RVD on 08/19/2019. -Myeloma panel on 09/10/2019 shows M spike improved to 1.2 g.  We have sent another myeloma panel which is pending. -She started taking Revlimid on 10/06/2019.  She had diarrhea on Sunday 5 times.  She thinks she might have eaten something different. -She met with Dr. Alvie Heidelberg  At [facility].  I have reviewed her records.  She was recommended to have bone marrow transplant done after 4 cycles. -I reviewed her labs  today.  She will proceed with cycle 3-day 1 of Velcade.  She will continue weekly Velcade.  I will see her back in 3 weeks for follow-up.  2.  Back pain: -Pain has improved since the start of treatment for myeloma.  She is not requiring hydrocodone on regular basis.  She takes it twice daily as needed.  3.  Breast cancer: -Patient had a history of right breast cancer in 2000 and  02/2006.  She was reportedly treated with chemotherapy followed by 1 year of Herceptin.  4.  Bone protection: -She reports that her tooth was sensitive in the lower jaw.  She will see her dentist. -We will initiate Zometa after dental evaluation.  5.  Hypokalemia: -She will continue potassium 20 mEq twice daily. -Her potassium today is 3.2.  She will receive additional dose today.  6.  ID prophylaxis: -She will continue acyclovir 400 mg twice daily. -She will also continue aspirin 81 mg for thromboprophylaxis.     Orders placed this encounter:  No orders of the defined types were placed in this encounter. Total time spent is 30 minutes with more than 50% of the time spent face-to-face, reviewing of records from outside institution, discussion about risks of bisphosphonate therapy, counseling and coordination of care.    Derek Jack, MD [place] [phone]

## 2019-10-08 NOTE — Progress Notes (Signed)
Stephanie Galvan tolerated injection without incident or complaint. See MAR for details. Discharged in satisfactory condition with follow up instructions.

## 2019-10-08 NOTE — Progress Notes (Signed)
Patient has been assessed, vital signs and labs have been reviewed by Dr. Delton Coombes. ANC, Creatinine, LFTs, and Platelets are within treatment parameters per Dr. Delton Coombes. The patient is good to proceed with Velcade at this time.

## 2019-10-09 LAB — PROTEIN ELECTROPHORESIS, SERUM
A/G Ratio: 1.2 (ref 0.7–1.7)
Albumin ELP: 3.4 g/dL (ref 2.9–4.4)
Alpha-1-Globulin: 0.3 g/dL (ref 0.0–0.4)
Alpha-2-Globulin: 0.8 g/dL (ref 0.4–1.0)
Beta Globulin: 1.1 g/dL (ref 0.7–1.3)
Gamma Globulin: 0.6 g/dL (ref 0.4–1.8)
Globulin, Total: 2.8 g/dL (ref 2.2–3.9)
M-Spike, %: 0.2 g/dL — ABNORMAL HIGH
Total Protein ELP: 6.2 g/dL (ref 6.0–8.5)

## 2019-10-09 LAB — KAPPA/LAMBDA LIGHT CHAINS
Kappa free light chain: 13.8 mg/L (ref 3.3–19.4)
Kappa, lambda light chain ratio: 1.33 (ref 0.26–1.65)
Lambda free light chains: 10.4 mg/L (ref 5.7–26.3)

## 2019-10-11 NOTE — Assessment & Plan Note (Signed)
1.  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  Kappa light chain 16.1, lambda light chains 140.4, ratio 0.11.  Beta-2 microglobulin 3.8.  LDH was normal.  Creatinine and calcium are normal. - Skeletal survey on 06/02/2019 showed old L1 compression fracture.  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  No evidence of lytic lesions. -24-hour urine shows total protein increased at 190 mg.  Bence-Jones protein positive, lambda type.  Immunofixation was positive. -Bone density test on 07/21/2019 shows T score of -2.2, osteopenic range. -Bone marrow biopsy on 06/22/2019 shows 50% lambda restricted plasma cells.  Hypercellular marrow with trilineage hematopoiesis.  Chromosome analysis was 23, XX.  Myeloma FISH panel was normal. -Recently had worsening back pain.  MRI of the thoracic spine showed acute to subacute benign appearing compression fractures of T8 and T10 with mild to moderate loss of height.  Chronic T12 compression fracture.  MRI of the lumbar spine on 06/25/2019 showed acute to subacute L1 and L3 compression fractures with up to 50% central loss of the height. -Bone density test on 07/21/2019 shows T score of -2.2 consistent with osteopenia. -Because of her vertebral fractures, treatment for myeloma was recommended. -Cycle 1 of RVD on 08/19/2019. -Myeloma panel on 09/10/2019 shows M spike improved to 1.2 g.  We have sent another myeloma panel which is pending. -She started taking Revlimid on 10/06/2019.  She had diarrhea on Sunday 5 times.  She thinks she might have eaten something different. -She met with Dr. Alvie Heidelberg  At [facility].  I have reviewed her records.  She was recommended to have bone marrow transplant done after 4 cycles. -I reviewed her labs today.  She will proceed with cycle 3-day 1 of Velcade.  She will continue weekly Velcade.  I will see her back in 3 weeks for follow-up.  2.  Back pain: -Pain has improved since the  start of treatment for myeloma.  She is not requiring hydrocodone on regular basis.  She takes it twice daily as needed.  3.  Breast cancer: -Patient had a history of right breast cancer in 2000 and 02/2006.  She was reportedly treated with chemotherapy followed by 1 year of Herceptin.  4.  Bone protection: -She reports that her tooth was sensitive in the lower jaw.  She will see her dentist. -We will initiate Zometa after dental evaluation.  5.  Hypokalemia: -She will continue potassium 20 mEq twice daily. -Her potassium today is 3.2.  She will receive additional dose today.  6.  ID prophylaxis: -She will continue acyclovir 400 mg twice daily. -She will also continue aspirin 81 mg for thromboprophylaxis.

## 2019-10-15 ENCOUNTER — Inpatient Hospital Stay (HOSPITAL_COMMUNITY): Payer: BC Managed Care – PPO | Attending: Hematology

## 2019-10-15 ENCOUNTER — Encounter (HOSPITAL_COMMUNITY): Payer: Self-pay | Admitting: *Deleted

## 2019-10-15 ENCOUNTER — Encounter (HOSPITAL_COMMUNITY): Payer: Self-pay

## 2019-10-15 ENCOUNTER — Inpatient Hospital Stay (HOSPITAL_COMMUNITY): Payer: BC Managed Care – PPO

## 2019-10-15 ENCOUNTER — Other Ambulatory Visit: Payer: Self-pay

## 2019-10-15 VITALS — BP 129/73 | HR 67 | Temp 97.1°F | Resp 18

## 2019-10-15 DIAGNOSIS — Z800 Family history of malignant neoplasm of digestive organs: Secondary | ICD-10-CM | POA: Insufficient documentation

## 2019-10-15 DIAGNOSIS — Z8379 Family history of other diseases of the digestive system: Secondary | ICD-10-CM | POA: Insufficient documentation

## 2019-10-15 DIAGNOSIS — R197 Diarrhea, unspecified: Secondary | ICD-10-CM | POA: Diagnosis not present

## 2019-10-15 DIAGNOSIS — E876 Hypokalemia: Secondary | ICD-10-CM | POA: Diagnosis not present

## 2019-10-15 DIAGNOSIS — M7989 Other specified soft tissue disorders: Secondary | ICD-10-CM | POA: Diagnosis not present

## 2019-10-15 DIAGNOSIS — Z809 Family history of malignant neoplasm, unspecified: Secondary | ICD-10-CM | POA: Diagnosis not present

## 2019-10-15 DIAGNOSIS — Z79899 Other long term (current) drug therapy: Secondary | ICD-10-CM | POA: Insufficient documentation

## 2019-10-15 DIAGNOSIS — Z87440 Personal history of urinary (tract) infections: Secondary | ICD-10-CM | POA: Diagnosis not present

## 2019-10-15 DIAGNOSIS — M549 Dorsalgia, unspecified: Secondary | ICD-10-CM | POA: Diagnosis not present

## 2019-10-15 DIAGNOSIS — Z9221 Personal history of antineoplastic chemotherapy: Secondary | ICD-10-CM | POA: Insufficient documentation

## 2019-10-15 DIAGNOSIS — Z853 Personal history of malignant neoplasm of breast: Secondary | ICD-10-CM | POA: Insufficient documentation

## 2019-10-15 DIAGNOSIS — Z818 Family history of other mental and behavioral disorders: Secondary | ICD-10-CM | POA: Insufficient documentation

## 2019-10-15 DIAGNOSIS — Z8249 Family history of ischemic heart disease and other diseases of the circulatory system: Secondary | ICD-10-CM | POA: Insufficient documentation

## 2019-10-15 DIAGNOSIS — R29890 Loss of height: Secondary | ICD-10-CM | POA: Insufficient documentation

## 2019-10-15 DIAGNOSIS — C9000 Multiple myeloma not having achieved remission: Secondary | ICD-10-CM | POA: Insufficient documentation

## 2019-10-15 DIAGNOSIS — D472 Monoclonal gammopathy: Secondary | ICD-10-CM

## 2019-10-15 DIAGNOSIS — Z5112 Encounter for antineoplastic immunotherapy: Secondary | ICD-10-CM | POA: Diagnosis present

## 2019-10-15 DIAGNOSIS — G479 Sleep disorder, unspecified: Secondary | ICD-10-CM | POA: Diagnosis not present

## 2019-10-15 DIAGNOSIS — Z833 Family history of diabetes mellitus: Secondary | ICD-10-CM | POA: Insufficient documentation

## 2019-10-15 DIAGNOSIS — Z7982 Long term (current) use of aspirin: Secondary | ICD-10-CM | POA: Diagnosis not present

## 2019-10-15 LAB — CBC WITH DIFFERENTIAL/PLATELET
Abs Immature Granulocytes: 0.01 10*3/uL (ref 0.00–0.07)
Basophils Absolute: 0 10*3/uL (ref 0.0–0.1)
Basophils Relative: 0 %
Eosinophils Absolute: 0 10*3/uL (ref 0.0–0.5)
Eosinophils Relative: 0 %
HCT: 38.5 % (ref 36.0–46.0)
Hemoglobin: 12.1 g/dL (ref 12.0–15.0)
Immature Granulocytes: 0 %
Lymphocytes Relative: 10 %
Lymphs Abs: 0.4 10*3/uL — ABNORMAL LOW (ref 0.7–4.0)
MCH: 29.3 pg (ref 26.0–34.0)
MCHC: 31.4 g/dL (ref 30.0–36.0)
MCV: 93.2 fL (ref 80.0–100.0)
Monocytes Absolute: 0 10*3/uL — ABNORMAL LOW (ref 0.1–1.0)
Monocytes Relative: 1 %
Neutro Abs: 3.8 10*3/uL (ref 1.7–7.7)
Neutrophils Relative %: 89 %
Platelets: 221 10*3/uL (ref 150–400)
RBC: 4.13 MIL/uL (ref 3.87–5.11)
RDW: 14.8 % (ref 11.5–15.5)
WBC: 4.3 10*3/uL (ref 4.0–10.5)
nRBC: 0 % (ref 0.0–0.2)

## 2019-10-15 LAB — COMPREHENSIVE METABOLIC PANEL
ALT: 33 U/L (ref 0–44)
AST: 48 U/L — ABNORMAL HIGH (ref 15–41)
Albumin: 3.9 g/dL (ref 3.5–5.0)
Alkaline Phosphatase: 193 U/L — ABNORMAL HIGH (ref 38–126)
Anion gap: 10 (ref 5–15)
BUN: 13 mg/dL (ref 8–23)
CO2: 26 mmol/L (ref 22–32)
Calcium: 9 mg/dL (ref 8.9–10.3)
Chloride: 101 mmol/L (ref 98–111)
Creatinine, Ser: 0.64 mg/dL (ref 0.44–1.00)
GFR calc Af Amer: >60 mL/min (ref >60)
GFR calc non Af Amer: >60 mL/min (ref >60)
Glucose, Bld: 123 mg/dL — ABNORMAL HIGH (ref 70–99)
Potassium: 3.5 mmol/L (ref 3.5–5.1)
Sodium: 137 mmol/L (ref 135–145)
Total Bilirubin: 0.6 mg/dL (ref 0.3–1.2)
Total Protein: 6.8 g/dL (ref 6.5–8.1)

## 2019-10-15 MED ORDER — BORTEZOMIB CHEMO SQ INJECTION 3.5 MG (2.5MG/ML)
1.3000 mg/m2 | Freq: Once | INTRAMUSCULAR | Status: AC
  Administered 2019-10-15: 16:00:00 2.75 mg via SUBCUTANEOUS
  Filled 2019-10-15: qty 1.1

## 2019-10-15 MED ORDER — PROCHLORPERAZINE MALEATE 10 MG PO TABS: 10.0000 mg | ORAL_TABLET | Freq: Once | ORAL | Status: DC

## 2019-10-15 NOTE — Progress Notes (Signed)
To treatment room for velcade injection.  Patient stated she is having new posterior calf bruising for the past week along with lower leg and ankle swelling.  She has lasix at home but has not taken any for the swelling.  Denied warm, hot spots on her calves.  No SOB or coughing.  No s/s of distress noted.  Reviewed with Francene Finders, NP, for velcade injection today.   Patient seen by Francene Finders, NP, with verbal order to start lasix with understanding verbalized by the patient.    Patient is scheduling a tooth extraction with the dentist.  They are requesting the cancer center sends a letter stating it is ok to have the tooth pulled.  Request sent to physicians nurse.     Patient tolerated Velcade injection with no complaints voiced.  Site clean and dry with no bruising or swelling noted at site.  Band aid applied.  Vss with discharge and left ambulatory with no s/s of distress noted.

## 2019-10-19 ENCOUNTER — Other Ambulatory Visit (HOSPITAL_COMMUNITY): Payer: Self-pay | Admitting: *Deleted

## 2019-10-19 DIAGNOSIS — D472 Monoclonal gammopathy: Secondary | ICD-10-CM

## 2019-10-19 MED ORDER — LENALIDOMIDE 25 MG PO CAPS: ORAL_CAPSULE | ORAL | 0 refills | Status: DC

## 2019-10-22 ENCOUNTER — Inpatient Hospital Stay (HOSPITAL_COMMUNITY): Payer: BC Managed Care – PPO

## 2019-10-22 ENCOUNTER — Other Ambulatory Visit (HOSPITAL_COMMUNITY): Payer: Self-pay | Admitting: *Deleted

## 2019-10-22 ENCOUNTER — Other Ambulatory Visit: Payer: Self-pay

## 2019-10-22 ENCOUNTER — Encounter (HOSPITAL_COMMUNITY): Payer: Self-pay

## 2019-10-22 VITALS — BP 135/70 | HR 69 | Temp 97.7°F | Resp 18

## 2019-10-22 DIAGNOSIS — C9000 Multiple myeloma not having achieved remission: Secondary | ICD-10-CM | POA: Diagnosis not present

## 2019-10-22 DIAGNOSIS — D472 Monoclonal gammopathy: Secondary | ICD-10-CM

## 2019-10-22 LAB — COMPREHENSIVE METABOLIC PANEL
ALT: 48 U/L — ABNORMAL HIGH (ref 0–44)
AST: 49 U/L — ABNORMAL HIGH (ref 15–41)
Albumin: 3.7 g/dL (ref 3.5–5.0)
Alkaline Phosphatase: 197 U/L — ABNORMAL HIGH (ref 38–126)
Anion gap: 10 (ref 5–15)
BUN: 22 mg/dL (ref 8–23)
CO2: 24 mmol/L (ref 22–32)
Calcium: 8.8 mg/dL — ABNORMAL LOW (ref 8.9–10.3)
Chloride: 103 mmol/L (ref 98–111)
Creatinine, Ser: 0.77 mg/dL (ref 0.44–1.00)
GFR calc Af Amer: >60 mL/min (ref >60)
GFR calc non Af Amer: >60 mL/min (ref >60)
Glucose, Bld: 109 mg/dL — ABNORMAL HIGH (ref 70–99)
Potassium: 3.3 mmol/L — ABNORMAL LOW (ref 3.5–5.1)
Sodium: 137 mmol/L (ref 135–145)
Total Bilirubin: 0.8 mg/dL (ref 0.3–1.2)
Total Protein: 6.8 g/dL (ref 6.5–8.1)

## 2019-10-22 LAB — CBC WITH DIFFERENTIAL/PLATELET
Abs Immature Granulocytes: 0.01 10*3/uL (ref 0.00–0.07)
Basophils Absolute: 0 10*3/uL (ref 0.0–0.1)
Basophils Relative: 0 %
Eosinophils Absolute: 0 10*3/uL (ref 0.0–0.5)
Eosinophils Relative: 0 %
HCT: 38.3 % (ref 36.0–46.0)
Hemoglobin: 12 g/dL (ref 12.0–15.0)
Immature Granulocytes: 0 %
Lymphocytes Relative: 16 %
Lymphs Abs: 0.5 10*3/uL — ABNORMAL LOW (ref 0.7–4.0)
MCH: 29.4 pg (ref 26.0–34.0)
MCHC: 31.3 g/dL (ref 30.0–36.0)
MCV: 93.9 fL (ref 80.0–100.0)
Monocytes Absolute: 0.1 10*3/uL (ref 0.1–1.0)
Monocytes Relative: 3 %
Neutro Abs: 2.4 10*3/uL (ref 1.7–7.7)
Neutrophils Relative %: 81 %
Platelets: 224 10*3/uL (ref 150–400)
RBC: 4.08 MIL/uL (ref 3.87–5.11)
RDW: 15.1 % (ref 11.5–15.5)
WBC: 3 10*3/uL — ABNORMAL LOW (ref 4.0–10.5)
nRBC: 0 % (ref 0.0–0.2)

## 2019-10-22 LAB — LACTATE DEHYDROGENASE: LDH: 162 U/L (ref 98–192)

## 2019-10-22 MED ORDER — BORTEZOMIB CHEMO SQ INJECTION 3.5 MG (2.5MG/ML)
1.3000 mg/m2 | Freq: Once | INTRAMUSCULAR | Status: AC
  Administered 2019-10-22: 16:00:00 2.75 mg via SUBCUTANEOUS
  Filled 2019-10-22: qty 1.1

## 2019-10-22 MED ORDER — PROCHLORPERAZINE MALEATE 10 MG PO TABS: 10.0000 mg | ORAL_TABLET | Freq: Once | ORAL | Status: DC

## 2019-10-22 MED ORDER — ERGOCALCIFEROL 1.25 MG (50000 UT) PO CAPS: 50000.0000 [IU] | ORAL_CAPSULE | ORAL | 1 refills | Status: DC

## 2019-10-22 NOTE — Progress Notes (Signed)
To treatment room for velcade. Patient stated her leg swelling is better after starting her lasix again.  No complaints of calf bruising.  Her tooth extraction is scheduled for Monday, October 26, 2019.  Pharmacy notified.   Patient stated prn diarrhea and using imodium as directed on the box.  Two loose stools on Friday and the same on Saturday.  Takes potassium tabs as directed.  Patient given the hand out from the cancer center for review on diarrhea and to carry home.  Reviewed revlimid and velcade with all questions asked.   No s/s of distress noted.   Patient instructed to remind the cancer center staff regarding her tooth extraction for the scheduling of her zometa.  Patient verbalized understanding.   Potassium 3.3 today.  Dr. Delton Coombes and pharmacy notified. No orders received from oncologist.   Patient tolerated Velcade injection with no complaints voiced.  Site clean and dry with no bruising or swelling noted at site.  Band aid applied.  Vss with discharge and left ambulatory with no s/s of distress noted.

## 2019-10-23 LAB — PROTEIN ELECTROPHORESIS, SERUM
A/G Ratio: 1.3 (ref 0.7–1.7)
Albumin ELP: 3.5 g/dL (ref 2.9–4.4)
Alpha-1-Globulin: 0.2 g/dL (ref 0.0–0.4)
Alpha-2-Globulin: 0.8 g/dL (ref 0.4–1.0)
Beta Globulin: 1 g/dL (ref 0.7–1.3)
Gamma Globulin: 0.6 g/dL (ref 0.4–1.8)
Globulin, Total: 2.7 g/dL (ref 2.2–3.9)
Total Protein ELP: 6.2 g/dL (ref 6.0–8.5)

## 2019-10-23 LAB — KAPPA/LAMBDA LIGHT CHAINS
Kappa free light chain: 16.8 mg/L (ref 3.3–19.4)
Kappa, lambda light chain ratio: 1.03 (ref 0.26–1.65)
Lambda free light chains: 16.3 mg/L (ref 5.7–26.3)

## 2019-10-29 ENCOUNTER — Other Ambulatory Visit (HOSPITAL_COMMUNITY): Payer: BC Managed Care – PPO

## 2019-10-29 ENCOUNTER — Ambulatory Visit (HOSPITAL_COMMUNITY): Payer: BC Managed Care – PPO

## 2019-10-29 ENCOUNTER — Ambulatory Visit (HOSPITAL_COMMUNITY): Payer: BC Managed Care – PPO | Admitting: Hematology

## 2019-11-03 ENCOUNTER — Encounter (HOSPITAL_COMMUNITY): Payer: Self-pay

## 2019-11-03 ENCOUNTER — Inpatient Hospital Stay (HOSPITAL_COMMUNITY): Payer: BC Managed Care – PPO | Admitting: Hematology

## 2019-11-03 ENCOUNTER — Encounter (HOSPITAL_COMMUNITY): Payer: Self-pay | Admitting: Hematology

## 2019-11-03 ENCOUNTER — Other Ambulatory Visit: Payer: Self-pay

## 2019-11-03 ENCOUNTER — Inpatient Hospital Stay (HOSPITAL_COMMUNITY): Payer: BC Managed Care – PPO

## 2019-11-03 VITALS — BP 116/70 | HR 64 | Temp 97.9°F | Resp 16 | Wt 170.0 lb

## 2019-11-03 DIAGNOSIS — D472 Monoclonal gammopathy: Secondary | ICD-10-CM

## 2019-11-03 DIAGNOSIS — C9000 Multiple myeloma not having achieved remission: Secondary | ICD-10-CM

## 2019-11-03 LAB — COMPREHENSIVE METABOLIC PANEL
ALT: 28 U/L (ref 0–44)
AST: 35 U/L (ref 15–41)
Albumin: 3.9 g/dL (ref 3.5–5.0)
Alkaline Phosphatase: 196 U/L — ABNORMAL HIGH (ref 38–126)
Anion gap: 11 (ref 5–15)
BUN: 14 mg/dL (ref 8–23)
CO2: 29 mmol/L (ref 22–32)
Calcium: 9 mg/dL (ref 8.9–10.3)
Chloride: 100 mmol/L (ref 98–111)
Creatinine, Ser: 0.75 mg/dL (ref 0.44–1.00)
GFR calc Af Amer: >60 mL/min (ref >60)
GFR calc non Af Amer: >60 mL/min (ref >60)
Glucose, Bld: 98 mg/dL (ref 70–99)
Potassium: 3.1 mmol/L — ABNORMAL LOW (ref 3.5–5.1)
Sodium: 140 mmol/L (ref 135–145)
Total Bilirubin: 0.8 mg/dL (ref 0.3–1.2)
Total Protein: 6.9 g/dL (ref 6.5–8.1)

## 2019-11-03 LAB — CBC WITH DIFFERENTIAL/PLATELET
Abs Immature Granulocytes: 0 10*3/uL (ref 0.00–0.07)
Basophils Absolute: 0 10*3/uL (ref 0.0–0.1)
Basophils Relative: 1 %
Eosinophils Absolute: 0.1 10*3/uL (ref 0.0–0.5)
Eosinophils Relative: 2 %
HCT: 37.5 % (ref 36.0–46.0)
Hemoglobin: 12.1 g/dL (ref 12.0–15.0)
Immature Granulocytes: 0 %
Lymphocytes Relative: 9 %
Lymphs Abs: 0.4 10*3/uL — ABNORMAL LOW (ref 0.7–4.0)
MCH: 30.2 pg (ref 26.0–34.0)
MCHC: 32.3 g/dL (ref 30.0–36.0)
MCV: 93.5 fL (ref 80.0–100.0)
Monocytes Absolute: 0.1 10*3/uL (ref 0.1–1.0)
Monocytes Relative: 3 %
Neutro Abs: 3.3 10*3/uL (ref 1.7–7.7)
Neutrophils Relative %: 85 %
Platelets: 317 10*3/uL (ref 150–400)
RBC: 4.01 MIL/uL (ref 3.87–5.11)
RDW: 15.5 % (ref 11.5–15.5)
WBC: 3.9 10*3/uL — ABNORMAL LOW (ref 4.0–10.5)
nRBC: 0 % (ref 0.0–0.2)

## 2019-11-03 MED ORDER — BORTEZOMIB CHEMO SQ INJECTION 3.5 MG (2.5MG/ML)
1.3000 mg/m2 | Freq: Once | INTRAMUSCULAR | Status: AC
  Administered 2019-11-03: 2.75 mg via SUBCUTANEOUS
  Filled 2019-11-03: qty 1.1

## 2019-11-03 MED ORDER — PROCHLORPERAZINE MALEATE 10 MG PO TABS: 10.0000 mg | ORAL_TABLET | Freq: Once | ORAL | Status: DC

## 2019-11-03 NOTE — Assessment & Plan Note (Addendum)
1.  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  Chromosome analysis was normal.  Myeloma FISH panel was normal. -Cycle 1 RVD on 08/19/2019, Revlimid started on 10/06/2019 due to difficulty obtaining it. -Cycle 3 of RVD on 10/08/2019.  She started her Revlimid back on 10/23/2019.  She is tolerating 25 mg 2 weeks on 1 week off very well.  Last week her appointment was rescheduled due to bad weather. -We reviewed blood work from 10/22/2019.  SPEP shows no M spike.  Free light chain ratio was normalized to 1.03.  Kappa light chains are 16.8 and lambda light chains are 16.3. -She will proceed with Velcade today.  We have reviewed her CBC which is grossly within normal limits.  I will reevaluate her in 3 weeks.  She has mild diarrhea which starts a day after Velcade shot.  Usually last 2 days.  She was told to take Imodium as needed. -She has an appointment at [facility] in April for possible stem cell collection.  2.  Back pain: -MRI of the thoracic spine showed acute to subacute benign appearing compression fractures of T8 and T10 with mild to moderate loss of height.  Chronic T12 compression fracture.  MRI of the lumbar spine showed subacute L1 and L3 compression fractures with up to 50% central loss of the height. -Bone density test on 07/21/2019 shows T score of -2.2 consistent with osteopenia. -Her back pain has improved significantly.  She is requiring hydrocodone twice daily as needed.  3.  Hypokalemia: -She is taking potassium 20 mEq twice daily.  Potassium today is 3.1.  We will increase it to 20 mEq 3 times a day.  4.  Bone protection: -She had her tooth pulled on 10/26/2019.  We will initiate Zometa in 3 weeks.  5.  ID prophylaxis: -We will continue acyclovir 400 mg twice daily.  She will continue aspirin 81 mg for thromboprophylaxis.  6.  Breast cancer: -Patient had history of right breast cancer in 2000 and June 2007.   She was reportedly treated with chemotherapy followed by 1 year of Herceptin.  She does not have any evidence of recurrence at this time.

## 2019-11-03 NOTE — Patient Instructions (Signed)
[place] at [facility] Discharge Instructions  You were seen today by Dr. Delton Coombes. He went over your recent lab results. Continue taking your medications as directed. He will see you back in 3 weeks for labs and follow up.   Thank you for choosing [place] at [facility] to provide your oncology and hematology care.  To afford each patient quality time with our provider, please arrive at least 15 minutes before your scheduled appointment time.   If you have a lab appointment with the [facility] please come in thru the  Main Entrance and check in at the main information desk  You need to re-schedule your appointment should you arrive 10 or more minutes late.  We strive to give you quality time with our providers, and arriving late affects you and other patients whose appointments are after yours.  Also, if you no show three or more times for appointments you may be dismissed from the clinic at the providers discretion.     Again, thank you for choosing [facility].  Our hope is that these requests will decrease the amount of time that you wait before being seen by our physicians.       _____________________________________________________________  Should you have questions after your visit to [facility], please contact our office at [phone] between the hours of 8:00 a.m. and 4:30 p.m.  Voicemails left after 4:00 p.m. will not be returned until the following business day.  For prescription refill requests, have your pharmacy contact our office and allow 72 hours.    Cancer Center Support Programs:   > Cancer Support Group  2nd Tuesday of the month 1pm-2pm, Journey Room

## 2019-11-03 NOTE — Progress Notes (Signed)
[place], [place] [address]   CLINIC:  Medical Oncology/Hematology  PCP:  [name], MD [place] [address] [phone]   REASON FOR VISIT:  Follow-up for monoclonal gammopathy.  CURRENT THERAPY: RVD    INTERVAL HISTORY:  Ms. Thorson 64 y.o. female seen for follow-up of multiple myeloma.  Reports her pain is improved in the back.  She is requiring hydrocodone twice daily as needed.  She does not take it every day.  She reported pressure in the back when she takes Revlimid.  She also reported diarrhea 1 day after each Velcade injection.  She is also taking potassium 20 mEq twice daily.  She had tooth pulled on 10/26/2019.  Her appointment last week was rescheduled due to bad weather.  Mild leg swelling or improved with Lasix.  REVIEW OF SYSTEMS:  Review of Systems  Cardiovascular: Positive for leg swelling.  Psychiatric/Behavioral: Positive for sleep disturbance.  All other systems reviewed and are negative.    PAST MEDICAL/SURGICAL HISTORY:  Past Medical History:  Diagnosis Date  . Breast cancer ([facility]) 2006   left breast  . Breast mass, right   . Compression fracture of cervical spine ([facility]) 06/18/2019   recent Chest xray stating had fx.  . High cholesterol   . History of bladder infections   . Hypertension   . Rash Aug 2013   Past Surgical History:  Procedure Laterality Date  . ABDOMINAL HYSTERECTOMY     just uterus removed  . BREAST BIOPSY    . BREAST LUMPECTOMY WITH RADIOACTIVE SEED LOCALIZATION Right 07/24/2018   Procedure: RIGHT BREAST LUMPECTOMY WITH RADIOACTIVE SEED LOCALIZATION;  Surgeon: Coralie Keens, MD;  Location: [facility];  Service: General;  Laterality: Right;  . MASTECTOMY  5/06   left  . PARTIAL HYSTERECTOMY    . PORT-A-CATH REMOVAL    . PORTACATH PLACEMENT       SOCIAL HISTORY:  Social History   Socioeconomic History  . Marital status: Married    Spouse  name: Not on file  . Number of children: 2  . Years of education: Not on file  . Highest education level: Not on file  Occupational History    Employer: [employer]  Tobacco Use  . Smoking status: Never Smoker  . Smokeless tobacco: Never Used  Substance and Sexual Activity  . Alcohol use: No  . Drug use: No  . Sexual activity: Yes    Birth control/protection: Surgical  Other Topics Concern  . Not on file  Social History Narrative  . Not on file   Social Determinants of Health   Financial Resource Strain:   . Difficulty of Paying Living Expenses: Not on file  Food Insecurity:   . Worried About Charity fundraiser in the Last Year: Not on file  . Ran Out of Food in the Last Year: Not on file  Transportation Needs:   . Lack of Transportation (Medical): Not on file  . Lack of Transportation (Non-Medical): Not on file  Physical Activity:   . Days of Exercise per Week: Not on file  . Minutes of Exercise per Session: Not on file  Stress:   . Feeling of Stress : Not on file  Social Connections:   . Frequency of Communication with Friends and Family: Not on file  . Frequency of Social Gatherings with Friends and Family: Not on file  . Attends Religious Services: Not on file  . Active  Member of Clubs or Organizations: Not on file  . Attends Archivist Meetings: Not on file  . Marital Status: Not on file  Intimate Partner Violence:   . Fear of Current or Ex-Partner: Not on file  . Emotionally Abused: Not on file  . Physically Abused: Not on file  . Sexually Abused: Not on file    FAMILY HISTORY:  Family History  Problem Relation Age of Onset  . Dementia Mother   . Stomach cancer Father   . Cancer Brother   . Cancer Brother   . Diabetes Brother   . Colitis Daughter   . Hypertension Daughter   . Hypertension Daughter     CURRENT MEDICATIONS:  Outpatient Encounter Medications as of 11/03/2019  Medication Sig  . acyclovir (ZOVIRAX) 400 MG tablet Take 1  tablet (400 mg total) by mouth 2 (two) times daily.  Marland Kitchen aspirin EC 81 MG tablet Take 1 tablet (81 mg total) by mouth daily.  Marland Kitchen atenolol (TENORMIN) 50 MG tablet Take 50 mg by mouth daily.    . bortezomib IV (VELCADE) 3.5 MG injection Inject 2.75 mg into the vein once a week. Days 1, 8, 15 every 21 days   . dexamethasone (DECADRON) 4 MG tablet Take 10 tablets (40 mg) on days 1, 8, and 15 of chemo. Repeat every 21 days.  . ergocalciferol (VITAMIN D2) 1.25 MG (50000 UT) capsule Take 1 capsule (50,000 Units total) by mouth once a week.  . hydrochlorothiazide 25 MG tablet Take 25 mg by mouth daily.    Marland Kitchen lenalidomide (REVLIMID) 25 MG capsule Take one capsule daily on days 1-14 every 21 days.  . potassium chloride (K-DUR) 10 MEQ tablet Take 10 mEq by mouth daily.  . Aspirin-Salicylamide-Caffeine (BC HEADACHE POWDER PO) Take 1 tablet by mouth as needed.   . furosemide (LASIX) 20 MG tablet TAKE 1 TABLET BY MOUTH EVERY DAY AS NEEDED (Patient not taking: Reported on 11/03/2019)  . HYDROcodone-acetaminophen (NORCO) 10-325 MG tablet ORAL TAKE 1 TABLET UP TO 3 TIMES A DAY FOR SEVERE PAIN.  Marland Kitchen prochlorperazine (COMPAZINE) 10 MG tablet Take 1 tablet (10 mg total) by mouth every 6 (six) hours as needed for nausea or vomiting. (Patient not taking: Reported on 11/03/2019)   No facility-administered encounter medications on file as of 11/03/2019.    ALLERGIES:  No Known Allergies   PHYSICAL EXAM:  ECOG Performance status: 1  Vitals:   11/03/19 1110  BP: 116/70  Pulse: 64  Resp: 16  Temp: 97.9 F (36.6 C)  SpO2: 98%   Filed Weights   11/03/19 1110  Weight: 170 lb (77.1 kg)    Physical Exam Vitals reviewed.  Constitutional:      Appearance: Normal appearance.  Cardiovascular:     Rate and Rhythm: Normal rate and regular rhythm.     Heart sounds: Normal heart sounds.  Pulmonary:     Effort: Pulmonary effort is normal.     Breath sounds: Normal breath sounds.  Abdominal:     General: There is no  distension.     Palpations: Abdomen is soft. There is no mass.  Musculoskeletal:     Right lower leg: Edema present.     Left lower leg: Edema present.  Skin:    General: Skin is warm.  Neurological:     General: No focal deficit present.     Mental Status: She is alert and oriented to person, place, and time.  Psychiatric:  Mood and Affect: Mood normal.        Behavior: Behavior normal.      LABORATORY DATA:  I have reviewed the labs as listed.  CBC    Component Value Date/Time   WBC 3.9 (L) 11/03/2019 1029   RBC 4.01 11/03/2019 1029   HGB 12.1 11/03/2019 1029   HCT 37.5 11/03/2019 1029   PLT 317 11/03/2019 1029   MCV 93.5 11/03/2019 1029   MCH 30.2 11/03/2019 1029   MCHC 32.3 11/03/2019 1029   RDW 15.5 11/03/2019 1029   LYMPHSABS 0.4 (L) 11/03/2019 1029   MONOABS 0.1 11/03/2019 1029   EOSABS 0.1 11/03/2019 1029   BASOSABS 0.0 11/03/2019 1029   CMP Latest Ref Rng & Units 11/03/2019 10/22/2019 10/15/2019  Glucose 70 - 99 mg/dL 98 109(H) 123(H)  BUN 8 - 23 mg/dL 14 22 13   Creatinine 0.44 - 1.00 mg/dL 0.75 0.77 0.64  Sodium 135 - 145 mmol/L 140 137 137  Potassium 3.5 - 5.1 mmol/L 3.1(L) 3.3(L) 3.5  Chloride 98 - 111 mmol/L 100 103 101  CO2 22 - 32 mmol/L 29 24 26   Calcium 8.9 - 10.3 mg/dL 9.0 8.8(L) 9.0  Total Protein 6.5 - 8.1 g/dL 6.9 6.8 6.8  Total Bilirubin 0.3 - 1.2 mg/dL 0.8 0.8 0.6  Alkaline Phos 38 - 126 U/L 196(H) 197(H) 193(H)  AST 15 - 41 U/L 35 49(H) 48(H)  ALT 0 - 44 U/L 28 48(H) 33       DIAGNOSTIC IMAGING:  I have independently reviewed the scans and discussed with the patient.     ASSESSMENT & PLAN:   Multiple myeloma ([facility]) 1.  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  Chromosome analysis was normal.  Myeloma FISH panel was normal. -Cycle 1 RVD on 08/19/2019, Revlimid started on 10/06/2019 due to difficulty obtaining it. -Cycle 3 of RVD on 10/08/2019.  She  started her Revlimid back on 10/23/2019.  She is tolerating 25 mg 2 weeks on 1 week off very well.  Last week her appointment was rescheduled due to bad weather. -We reviewed blood work from 10/22/2019.  SPEP shows no M spike.  Free light chain ratio was normalized to 1.03.  Kappa light chains are 16.8 and lambda light chains are 16.3. -She will proceed with Velcade today.  We have reviewed her CBC which is grossly within normal limits.  I will reevaluate her in 3 weeks.  She has mild diarrhea which starts a day after Velcade shot.  Usually last 2 days.  She was told to take Imodium as needed. -She has an appointment at [facility] in April for possible stem cell collection.  2.  Back pain: -MRI of the thoracic spine showed acute to subacute benign appearing compression fractures of T8 and T10 with mild to moderate loss of height.  Chronic T12 compression fracture.  MRI of the lumbar spine showed subacute L1 and L3 compression fractures with up to 50% central loss of the height. -Bone density test on 07/21/2019 shows T score of -2.2 consistent with osteopenia. -Her back pain has improved significantly.  She is requiring hydrocodone twice daily as needed.  3.  Hypokalemia: -She is taking potassium 20 mEq twice daily.  Potassium today is 3.1.  We will increase it to 20 mEq 3 times a day.  4.  Bone protection: -She had her tooth pulled on 10/26/2019.  We will initiate Zometa in 3 weeks.  5.  ID prophylaxis: -We will continue acyclovir 400 mg twice daily.  She will continue aspirin 81 mg for thromboprophylaxis.  6.  Breast cancer: -Patient had history of right breast cancer in 2000 and June 2007.  She was reportedly treated with chemotherapy followed by 1 year of Herceptin.  She does not have any evidence of recurrence at this time.   Orders placed this encounter:  Orders Placed This Encounter  Procedures  . CBC with Differential/Platelet  . Comprehensive metabolic panel  . Protein electrophoresis,  serum  . Kappa/lambda light chains  . Lactate dehydrogenase  . Immunofixation electrophoresis      Derek Jack, MD [place] [phone]

## 2019-11-03 NOTE — Progress Notes (Signed)
Patient tolerated Velcade injection with no complaints voiced.  Site clean and dry with no bruising or swelling noted at site.  Band aid applied.  Vss with discharge and left ambulatory with no s/s of distress noted.  

## 2019-11-06 ENCOUNTER — Other Ambulatory Visit (HOSPITAL_COMMUNITY): Payer: Self-pay | Admitting: Hematology

## 2019-11-06 DIAGNOSIS — D472 Monoclonal gammopathy: Secondary | ICD-10-CM

## 2019-11-09 ENCOUNTER — Other Ambulatory Visit (HOSPITAL_COMMUNITY): Payer: Self-pay | Admitting: *Deleted

## 2019-11-09 DIAGNOSIS — D472 Monoclonal gammopathy: Secondary | ICD-10-CM

## 2019-11-09 MED ORDER — LENALIDOMIDE 25 MG PO CAPS: ORAL_CAPSULE | ORAL | 0 refills | Status: DC

## 2019-11-10 ENCOUNTER — Inpatient Hospital Stay (HOSPITAL_COMMUNITY): Payer: BC Managed Care – PPO | Attending: Hematology

## 2019-11-10 ENCOUNTER — Inpatient Hospital Stay (HOSPITAL_COMMUNITY): Payer: BC Managed Care – PPO

## 2019-11-10 ENCOUNTER — Other Ambulatory Visit: Payer: Self-pay

## 2019-11-10 VITALS — BP 140/76 | HR 70 | Temp 97.1°F | Resp 18 | Wt 173.4 lb

## 2019-11-10 DIAGNOSIS — Z8379 Family history of other diseases of the digestive system: Secondary | ICD-10-CM | POA: Insufficient documentation

## 2019-11-10 DIAGNOSIS — M549 Dorsalgia, unspecified: Secondary | ICD-10-CM | POA: Insufficient documentation

## 2019-11-10 DIAGNOSIS — Z818 Family history of other mental and behavioral disorders: Secondary | ICD-10-CM | POA: Diagnosis not present

## 2019-11-10 DIAGNOSIS — R197 Diarrhea, unspecified: Secondary | ICD-10-CM | POA: Insufficient documentation

## 2019-11-10 DIAGNOSIS — E876 Hypokalemia: Secondary | ICD-10-CM | POA: Insufficient documentation

## 2019-11-10 DIAGNOSIS — Z800 Family history of malignant neoplasm of digestive organs: Secondary | ICD-10-CM | POA: Insufficient documentation

## 2019-11-10 DIAGNOSIS — Z833 Family history of diabetes mellitus: Secondary | ICD-10-CM | POA: Diagnosis not present

## 2019-11-10 DIAGNOSIS — Z809 Family history of malignant neoplasm, unspecified: Secondary | ICD-10-CM | POA: Diagnosis not present

## 2019-11-10 DIAGNOSIS — Z79899 Other long term (current) drug therapy: Secondary | ICD-10-CM | POA: Insufficient documentation

## 2019-11-10 DIAGNOSIS — C9000 Multiple myeloma not having achieved remission: Secondary | ICD-10-CM | POA: Diagnosis not present

## 2019-11-10 DIAGNOSIS — Z853 Personal history of malignant neoplasm of breast: Secondary | ICD-10-CM | POA: Insufficient documentation

## 2019-11-10 DIAGNOSIS — Z8249 Family history of ischemic heart disease and other diseases of the circulatory system: Secondary | ICD-10-CM | POA: Insufficient documentation

## 2019-11-10 DIAGNOSIS — D472 Monoclonal gammopathy: Secondary | ICD-10-CM

## 2019-11-10 DIAGNOSIS — Z9221 Personal history of antineoplastic chemotherapy: Secondary | ICD-10-CM | POA: Insufficient documentation

## 2019-11-10 DIAGNOSIS — Z5112 Encounter for antineoplastic immunotherapy: Secondary | ICD-10-CM | POA: Diagnosis not present

## 2019-11-10 LAB — CBC WITH DIFFERENTIAL/PLATELET
Abs Immature Granulocytes: 0 10*3/uL (ref 0.00–0.07)
Basophils Absolute: 0 10*3/uL (ref 0.0–0.1)
Basophils Relative: 1 %
Eosinophils Absolute: 0.2 10*3/uL (ref 0.0–0.5)
Eosinophils Relative: 7 %
HCT: 37.2 % (ref 36.0–46.0)
Hemoglobin: 11.9 g/dL — ABNORMAL LOW (ref 12.0–15.0)
Immature Granulocytes: 0 %
Lymphocytes Relative: 18 %
Lymphs Abs: 0.5 10*3/uL — ABNORMAL LOW (ref 0.7–4.0)
MCH: 30.1 pg (ref 26.0–34.0)
MCHC: 32 g/dL (ref 30.0–36.0)
MCV: 93.9 fL (ref 80.0–100.0)
Monocytes Absolute: 0.5 10*3/uL (ref 0.1–1.0)
Monocytes Relative: 17 %
Neutro Abs: 1.7 10*3/uL (ref 1.7–7.7)
Neutrophils Relative %: 57 %
Platelets: 227 10*3/uL (ref 150–400)
RBC: 3.96 MIL/uL (ref 3.87–5.11)
RDW: 15.4 % (ref 11.5–15.5)
WBC: 3 10*3/uL — ABNORMAL LOW (ref 4.0–10.5)
nRBC: 0 % (ref 0.0–0.2)

## 2019-11-10 LAB — COMPREHENSIVE METABOLIC PANEL
ALT: 24 U/L (ref 0–44)
AST: 25 U/L (ref 15–41)
Albumin: 3.7 g/dL (ref 3.5–5.0)
Alkaline Phosphatase: 167 U/L — ABNORMAL HIGH (ref 38–126)
Anion gap: 9 (ref 5–15)
BUN: 15 mg/dL (ref 8–23)
CO2: 25 mmol/L (ref 22–32)
Calcium: 8.8 mg/dL — ABNORMAL LOW (ref 8.9–10.3)
Chloride: 103 mmol/L (ref 98–111)
Creatinine, Ser: 0.77 mg/dL (ref 0.44–1.00)
GFR calc Af Amer: >60 mL/min (ref >60)
GFR calc non Af Amer: >60 mL/min (ref >60)
Glucose, Bld: 93 mg/dL (ref 70–99)
Potassium: 3.4 mmol/L — ABNORMAL LOW (ref 3.5–5.1)
Sodium: 137 mmol/L (ref 135–145)
Total Bilirubin: 0.7 mg/dL (ref 0.3–1.2)
Total Protein: 6.4 g/dL — ABNORMAL LOW (ref 6.5–8.1)

## 2019-11-10 MED ORDER — PROCHLORPERAZINE MALEATE 10 MG PO TABS: 10.0000 mg | ORAL_TABLET | Freq: Once | ORAL | Status: DC

## 2019-11-10 MED ORDER — BORTEZOMIB CHEMO SQ INJECTION 3.5 MG (2.5MG/ML)
1.3000 mg/m2 | Freq: Once | INTRAMUSCULAR | Status: AC
  Administered 2019-11-10: 2.75 mg via SUBCUTANEOUS
  Filled 2019-11-10: qty 1.1

## 2019-11-10 NOTE — Progress Notes (Signed)
Stephanie Galvan presents today for Velcade injection. Lab results reviewed and parameters met for treatment. Pt reports no changes since last week. Injection tolerated without incident or complaint. VSS. Discharged in satisfactory condition with follow up instructions. 

## 2019-11-10 NOTE — Patient Instructions (Signed)
[facility] Discharge Instructions for Patients Receiving Chemotherapy   Beginning January 23rd 2017 lab work for the Cancer Center will be done in the  Main lab at [facility] on 1st floor. If you have a lab appointment with the Cancer Center please come in thru the  Main Entrance and check in at the main information desk   Today you received the following chemotherapy agents Velcade  To help prevent nausea and vomiting after your treatment, we encourage you to take your nausea medication    If you develop nausea and vomiting, or diarrhea that is not controlled by your medication, call the clinic.  The clinic phone number is [phone]. Office hours are Monday-Friday 8:30am-5:00pm.  BELOW ARE SYMPTOMS THAT SHOULD BE REPORTED IMMEDIATELY:  *FEVER GREATER THAN 101.0 F  *CHILLS WITH OR WITHOUT FEVER  NAUSEA AND VOMITING THAT IS NOT CONTROLLED WITH YOUR NAUSEA MEDICATION  *UNUSUAL SHORTNESS OF BREATH  *UNUSUAL BRUISING OR BLEEDING  TENDERNESS IN MOUTH AND THROAT WITH OR WITHOUT PRESENCE OF ULCERS  *URINARY PROBLEMS  *BOWEL PROBLEMS  UNUSUAL RASH Items with * indicate a potential emergency and should be followed up as soon as possible. If you have an emergency after office hours please contact your primary care physician or go to the nearest emergency department.  Please call the clinic during office hours if you have any questions or concerns.   You may also contact the Patient Navigator at [phone] should you have any questions or need assistance in obtaining follow up care.      Resources For Cancer Patients and their Caregivers ? American Cancer Society: Can assist with transportation, wigs, general needs, runs Look Good Feel Better.        1-888-227-6333 ? Cancer Care: Provides financial assistance, online support groups, medication/co-pay assistance.  1-800-813-HOPE (4673) ? [facility] Assists [place] cancer  patients and their families through emotional , educational and financial support.  336-427-4357 ? [place] DSS Where to apply for food stamps, Medicaid and utility assistance. 336-342-1394 ? [facility]: Transportation to medical appointments. 336-347-2287 ? Social Security Administration: May apply for disability if have a Stage IV cancer. 336-342-7796 1-800-772-1213 ? [place] Aging, Disability and Transit Services: Assists with nutrition, care and transit needs. 336-349-2343          

## 2019-11-17 ENCOUNTER — Encounter (HOSPITAL_COMMUNITY): Payer: Self-pay

## 2019-11-17 ENCOUNTER — Inpatient Hospital Stay (HOSPITAL_COMMUNITY): Payer: BC Managed Care – PPO

## 2019-11-17 ENCOUNTER — Other Ambulatory Visit: Payer: Self-pay

## 2019-11-17 VITALS — BP 152/86 | HR 72 | Temp 96.9°F | Resp 18

## 2019-11-17 DIAGNOSIS — D472 Monoclonal gammopathy: Secondary | ICD-10-CM

## 2019-11-17 DIAGNOSIS — Z5112 Encounter for antineoplastic immunotherapy: Secondary | ICD-10-CM | POA: Diagnosis not present

## 2019-11-17 LAB — COMPREHENSIVE METABOLIC PANEL
ALT: 19 U/L (ref 0–44)
AST: 17 U/L (ref 15–41)
Albumin: 3.5 g/dL (ref 3.5–5.0)
Alkaline Phosphatase: 159 U/L — ABNORMAL HIGH (ref 38–126)
Anion gap: 8 (ref 5–15)
BUN: 15 mg/dL (ref 8–23)
CO2: 25 mmol/L (ref 22–32)
Calcium: 8.9 mg/dL (ref 8.9–10.3)
Chloride: 107 mmol/L (ref 98–111)
Creatinine, Ser: 0.75 mg/dL (ref 0.44–1.00)
GFR calc Af Amer: >60 mL/min (ref >60)
GFR calc non Af Amer: >60 mL/min (ref >60)
Glucose, Bld: 96 mg/dL (ref 70–99)
Potassium: 3.8 mmol/L (ref 3.5–5.1)
Sodium: 140 mmol/L (ref 135–145)
Total Bilirubin: 0.7 mg/dL (ref 0.3–1.2)
Total Protein: 6.3 g/dL — ABNORMAL LOW (ref 6.5–8.1)

## 2019-11-17 LAB — LACTATE DEHYDROGENASE: LDH: 141 U/L (ref 98–192)

## 2019-11-17 LAB — CBC WITH DIFFERENTIAL/PLATELET
Abs Immature Granulocytes: 0.01 10*3/uL (ref 0.00–0.07)
Basophils Absolute: 0 10*3/uL (ref 0.0–0.1)
Basophils Relative: 1 %
Eosinophils Absolute: 0.1 10*3/uL (ref 0.0–0.5)
Eosinophils Relative: 4 %
HCT: 36.9 % (ref 36.0–46.0)
Hemoglobin: 11.9 g/dL — ABNORMAL LOW (ref 12.0–15.0)
Immature Granulocytes: 0 %
Lymphocytes Relative: 14 %
Lymphs Abs: 0.5 10*3/uL — ABNORMAL LOW (ref 0.7–4.0)
MCH: 30.2 pg (ref 26.0–34.0)
MCHC: 32.2 g/dL (ref 30.0–36.0)
MCV: 93.7 fL (ref 80.0–100.0)
Monocytes Absolute: 0.4 10*3/uL (ref 0.1–1.0)
Monocytes Relative: 9 %
Neutro Abs: 2.7 10*3/uL (ref 1.7–7.7)
Neutrophils Relative %: 72 %
Platelets: 201 10*3/uL (ref 150–400)
RBC: 3.94 MIL/uL (ref 3.87–5.11)
RDW: 15.6 % — ABNORMAL HIGH (ref 11.5–15.5)
WBC: 3.7 10*3/uL — ABNORMAL LOW (ref 4.0–10.5)
nRBC: 0 % (ref 0.0–0.2)

## 2019-11-17 MED ORDER — PROCHLORPERAZINE MALEATE 10 MG PO TABS: 10.0000 mg | ORAL_TABLET | Freq: Once | ORAL | Status: DC

## 2019-11-17 MED ORDER — BORTEZOMIB CHEMO SQ INJECTION 3.5 MG (2.5MG/ML)
1.3000 mg/m2 | Freq: Once | INTRAMUSCULAR | Status: AC
  Administered 2019-11-17: 2.75 mg via SUBCUTANEOUS
  Filled 2019-11-17: qty 1.1

## 2019-11-17 NOTE — Patient Instructions (Signed)
[facility] Discharge Instructions for Patients Receiving Chemotherapy   Beginning January 23rd 2017 lab work for the Cancer Center will be done in the  Main lab at [facility] on 1st floor. If you have a lab appointment with the Cancer Center please come in thru the  Main Entrance and check in at the main information desk   Today you received the following chemotherapy agents Velcade injection. Follow-up as scheduled. Call clinic for any questions or concerns  To help prevent nausea and vomiting after your treatment, we encourage you to take your nausea medication   If you develop nausea and vomiting, or diarrhea that is not controlled by your medication, call the clinic.  The clinic phone number is [phone]. Office hours are Monday-Friday 8:30am-5:00pm.  BELOW ARE SYMPTOMS THAT SHOULD BE REPORTED IMMEDIATELY:  *FEVER GREATER THAN 101.0 F  *CHILLS WITH OR WITHOUT FEVER  NAUSEA AND VOMITING THAT IS NOT CONTROLLED WITH YOUR NAUSEA MEDICATION  *UNUSUAL SHORTNESS OF BREATH  *UNUSUAL BRUISING OR BLEEDING  TENDERNESS IN MOUTH AND THROAT WITH OR WITHOUT PRESENCE OF ULCERS  *URINARY PROBLEMS  *BOWEL PROBLEMS  UNUSUAL RASH Items with * indicate a potential emergency and should be followed up as soon as possible. If you have an emergency after office hours please contact your primary care physician or go to the nearest emergency department.  Please call the clinic during office hours if you have any questions or concerns.   You may also contact the Patient Navigator at [phone] should you have any questions or need assistance in obtaining follow up care.      Resources For Cancer Patients and their Caregivers ? American Cancer Society: Can assist with transportation, wigs, general needs, runs Look Good Feel Better.        1-888-227-6333 ? Cancer Care: Provides financial assistance, online support groups, medication/co-pay assistance.   1-800-813-HOPE (4673) ? [facility] Assists [place] cancer patients and their families through emotional , educational and financial support.  336-427-4357 ? [place] DSS Where to apply for food stamps, Medicaid and utility assistance. 336-342-1394 ? RC[facility]: Transportation to medical appointments. 336-347-2287 ? Social Security Administration: May apply for disability if have a Stage IV cancer. 336-342-7796 1-800-772-1213 ? [place] Aging, Disability and Transit Services: Assists with nutrition, care and transit needs. 336-349-2343         

## 2019-11-17 NOTE — Progress Notes (Signed)
Stephanie Galvan tolerated Velcade injection well without complaints or incident. VSS. Labs reviewed prior to administering this medication. Pt discharged self ambulatory in satisfactory condition

## 2019-11-18 LAB — PROTEIN ELECTROPHORESIS, SERUM
A/G Ratio: 1.4 (ref 0.7–1.7)
Albumin ELP: 3.4 g/dL (ref 2.9–4.4)
Alpha-1-Globulin: 0.2 g/dL (ref 0.0–0.4)
Alpha-2-Globulin: 0.7 g/dL (ref 0.4–1.0)
Beta Globulin: 1 g/dL (ref 0.7–1.3)
Gamma Globulin: 0.5 g/dL (ref 0.4–1.8)
Globulin, Total: 2.4 g/dL (ref 2.2–3.9)
Total Protein ELP: 5.8 g/dL — ABNORMAL LOW (ref 6.0–8.5)

## 2019-11-18 LAB — KAPPA/LAMBDA LIGHT CHAINS
Kappa free light chain: 14.8 mg/L (ref 3.3–19.4)
Kappa, lambda light chain ratio: 1.23 (ref 0.26–1.65)
Lambda free light chains: 12 mg/L (ref 5.7–26.3)

## 2019-11-19 ENCOUNTER — Other Ambulatory Visit (HOSPITAL_COMMUNITY): Payer: Self-pay | Admitting: *Deleted

## 2019-11-19 DIAGNOSIS — D472 Monoclonal gammopathy: Secondary | ICD-10-CM

## 2019-11-19 MED ORDER — DEXAMETHASONE 4 MG PO TABS: ORAL_TABLET | ORAL | 3 refills | Status: DC

## 2019-11-20 LAB — IMMUNOFIXATION ELECTROPHORESIS
IgA: 49 mg/dL — ABNORMAL LOW (ref 87–352)
IgG (Immunoglobin G), Serum: 676 mg/dL (ref 586–1602)
IgM (Immunoglobulin M), Srm: 53 mg/dL (ref 26–217)
Total Protein ELP: 5.7 g/dL — ABNORMAL LOW (ref 6.0–8.5)

## 2019-11-24 ENCOUNTER — Encounter (HOSPITAL_COMMUNITY): Payer: Self-pay | Admitting: Hematology

## 2019-11-24 ENCOUNTER — Inpatient Hospital Stay (HOSPITAL_COMMUNITY): Payer: BC Managed Care – PPO

## 2019-11-24 ENCOUNTER — Inpatient Hospital Stay (HOSPITAL_COMMUNITY): Payer: BC Managed Care – PPO | Admitting: Hematology

## 2019-11-24 ENCOUNTER — Other Ambulatory Visit: Payer: Self-pay

## 2019-11-24 VITALS — BP 139/69 | HR 70 | Temp 97.5°F | Resp 18 | Wt 172.1 lb

## 2019-11-24 DIAGNOSIS — C9000 Multiple myeloma not having achieved remission: Secondary | ICD-10-CM

## 2019-11-24 DIAGNOSIS — Z5112 Encounter for antineoplastic immunotherapy: Secondary | ICD-10-CM | POA: Diagnosis not present

## 2019-11-24 DIAGNOSIS — D472 Monoclonal gammopathy: Secondary | ICD-10-CM

## 2019-11-24 LAB — CBC WITH DIFFERENTIAL/PLATELET
Abs Immature Granulocytes: 0.01 10*3/uL (ref 0.00–0.07)
Basophils Absolute: 0 10*3/uL (ref 0.0–0.1)
Basophils Relative: 1 %
Eosinophils Absolute: 0.2 10*3/uL (ref 0.0–0.5)
Eosinophils Relative: 5 %
HCT: 36.9 % (ref 36.0–46.0)
Hemoglobin: 11.7 g/dL — ABNORMAL LOW (ref 12.0–15.0)
Immature Granulocytes: 0 %
Lymphocytes Relative: 11 %
Lymphs Abs: 0.4 10*3/uL — ABNORMAL LOW (ref 0.7–4.0)
MCH: 29.5 pg (ref 26.0–34.0)
MCHC: 31.7 g/dL (ref 30.0–36.0)
MCV: 93.2 fL (ref 80.0–100.0)
Monocytes Absolute: 0.1 10*3/uL (ref 0.1–1.0)
Monocytes Relative: 3 %
Neutro Abs: 3.1 10*3/uL (ref 1.7–7.7)
Neutrophils Relative %: 80 %
Platelets: 179 10*3/uL (ref 150–400)
RBC: 3.96 MIL/uL (ref 3.87–5.11)
RDW: 15.9 % — ABNORMAL HIGH (ref 11.5–15.5)
WBC: 3.9 10*3/uL — ABNORMAL LOW (ref 4.0–10.5)
nRBC: 0 % (ref 0.0–0.2)

## 2019-11-24 LAB — COMPREHENSIVE METABOLIC PANEL
ALT: 22 U/L (ref 0–44)
AST: 27 U/L (ref 15–41)
Albumin: 3.7 g/dL (ref 3.5–5.0)
Alkaline Phosphatase: 147 U/L — ABNORMAL HIGH (ref 38–126)
Anion gap: 10 (ref 5–15)
BUN: 19 mg/dL (ref 8–23)
CO2: 25 mmol/L (ref 22–32)
Calcium: 8.7 mg/dL — ABNORMAL LOW (ref 8.9–10.3)
Chloride: 105 mmol/L (ref 98–111)
Creatinine, Ser: 0.87 mg/dL (ref 0.44–1.00)
GFR calc Af Amer: >60 mL/min (ref >60)
GFR calc non Af Amer: >60 mL/min (ref >60)
Glucose, Bld: 94 mg/dL (ref 70–99)
Potassium: 3.6 mmol/L (ref 3.5–5.1)
Sodium: 140 mmol/L (ref 135–145)
Total Bilirubin: 0.7 mg/dL (ref 0.3–1.2)
Total Protein: 6.2 g/dL — ABNORMAL LOW (ref 6.5–8.1)

## 2019-11-24 MED ORDER — HYDROCODONE-ACETAMINOPHEN 10-325 MG PO TABS: 1.0000 | ORAL_TABLET | Freq: Three times a day (TID) | ORAL | 0 refills | Status: DC | PRN

## 2019-11-24 MED ORDER — PROCHLORPERAZINE MALEATE 10 MG PO TABS: 10.0000 mg | ORAL_TABLET | Freq: Once | ORAL | Status: DC

## 2019-11-24 MED ORDER — BORTEZOMIB CHEMO SQ INJECTION 3.5 MG (2.5MG/ML)
1.3000 mg/m2 | Freq: Once | INTRAMUSCULAR | Status: AC
  Administered 2019-11-24: 12:00:00 2.75 mg via SUBCUTANEOUS
  Filled 2019-11-24: qty 1.1

## 2019-11-24 NOTE — Patient Instructions (Signed)
[facility] Discharge Instructions for Patients Receiving Chemotherapy   Beginning January 23rd 2017 lab work for the Cancer Center will be done in the  Main lab at [facility] on 1st floor. If you have a lab appointment with the Cancer Center please come in thru the  Main Entrance and check in at the main information desk   Today you received the following chemotherapy agents Velcade injection. Follow-up as scheduled. Call clinic for any questions or concerns  To help prevent nausea and vomiting after your treatment, we encourage you to take your nausea medication   If you develop nausea and vomiting, or diarrhea that is not controlled by your medication, call the clinic.  The clinic phone number is [phone]. Office hours are Monday-Friday 8:30am-5:00pm.  BELOW ARE SYMPTOMS THAT SHOULD BE REPORTED IMMEDIATELY:  *FEVER GREATER THAN 101.0 F  *CHILLS WITH OR WITHOUT FEVER  NAUSEA AND VOMITING THAT IS NOT CONTROLLED WITH YOUR NAUSEA MEDICATION  *UNUSUAL SHORTNESS OF BREATH  *UNUSUAL BRUISING OR BLEEDING  TENDERNESS IN MOUTH AND THROAT WITH OR WITHOUT PRESENCE OF ULCERS  *URINARY PROBLEMS  *BOWEL PROBLEMS  UNUSUAL RASH Items with * indicate a potential emergency and should be followed up as soon as possible. If you have an emergency after office hours please contact your primary care physician or go to the nearest emergency department.  Please call the clinic during office hours if you have any questions or concerns.   You may also contact the Patient Navigator at [phone] should you have any questions or need assistance in obtaining follow up care.      Resources For Cancer Patients and their Caregivers ? American Cancer Society: Can assist with transportation, wigs, general needs, runs Look Good Feel Better.        1-888-227-6333 ? Cancer Care: Provides financial assistance, online support groups, medication/co-pay assistance.   1-800-813-HOPE (4673) ? [facility] Assists [place] cancer patients and their families through emotional , educational and financial support.  336-427-4357 ? [place] DSS Where to apply for food stamps, Medicaid and utility assistance. 336-342-1394 ? RCATS: Transportation to medical appointments. 336-347-2287 ? Social Security Administration: May apply for disability if have a Stage IV cancer. 336-342-7796 1-800-772-1213 ? [place] Aging, Disability and Transit Services: Assists with nutrition, care and transit needs. 336-349-2343         

## 2019-11-24 NOTE — Patient Instructions (Signed)
[place] [facility] Discharge Instructions  You were seen today by Dr. Delton Coombes. He went over your recent lab results. Your myeloma labs are normal. Continue weekly injections.He will see you back in 3 weeks for labs and follow up.   Thank you for choosing [facility] at [facility] to provide your oncology and hematology care.  To afford each patient quality time with our provider, please arrive at least 15 minutes before your scheduled appointment time.   If you have a lab appointment with the [facility] please come in thru the  Main Entrance and check in at the main information desk  You need to re-schedule your appointment should you arrive 10 or more minutes late.  We strive to give you quality time with our providers, and arriving late affects you and other patients whose appointments are after yours.  Also, if you no show three or more times for appointments you may be dismissed from the clinic at the providers discretion.     Again, thank you for choosing [facility].  Our hope is that these requests will decrease the amount of time that you wait before being seen by our physicians.       _____________________________________________________________  Should you have questions after your visit to [facility], please contact our office at [phone] between the hours of 8:00 a.m. and 4:30 p.m.  Voicemails left after 4:00 p.m. will not be returned until the following business day.  For prescription refill requests, have your pharmacy contact our office and allow 72 hours.    Cancer Center Support Programs:   > Cancer Support Group  2nd Tuesday of the month 1pm-2pm, Journey Room

## 2019-11-24 NOTE — Progress Notes (Signed)
1113 Labs reviewed with and pt seen by Dr. Delton Coombes and pt approved for Velcade injection today per MD                                  Stephanie Galvan tolerated Velcade injection well without complaints or incident.Pt discharged self ambulatory in satisfactory condition

## 2019-11-24 NOTE — Assessment & Plan Note (Signed)
1.  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  Chromosome analysis was normal.  Myeloma FISH panel was normal. -4 cycles of RVD from 08/19/2019 through 11/03/2019 with Revlimid started on 10/06/2019. -She started her third bottle of Revlimid on 11/17/2019. -She gets diarrhea for 1 day after taking Velcade injection.  Otherwise denies any neuropathy. -We reviewed myeloma panel from 11/17/2019.  SPEP did not reveal any M spike.  Light chain ratio is normal at 1.23.  Lambda light chains are 12.  Immunofixation is negative. -We reviewed her CBC and LFTs which are adequate to proceed with her treatment today. -I will see her back in 3 weeks for follow-up.  She has an appointment at [facility] in April for possible stem cell collection. -She would like to do a bone marrow transplant after she enrolls on Medicare in October.  2.  Back pain: -Previous MRI of the T-spine showed compression fractures of T8 and T10 with mild to moderate loss of height.  Chronic T12 compression fracture.  MRI of the L-spine showed subacute L1 and L3 compression fractures up to 50% loss of height. -Bone density on 07/21/2019 shows T score -2.2 consistent with osteopenia. -She still has some pressure in the lower back.  She is taking BC powder as needed.  I have told her to stay away from [facility] powders. -I have sent a prescription for hydrocodone 10 mg to be taken as needed.  3.  Hypokalemia: -She will continue potassium 20 mg 3 times daily.  Potassium today is 3.6.  4.  Bone protection: -She had her tooth pulled on 10/26/2019. -Creatinine today 0.87 calcium 8.7.  She will start her Zometa today.  5.  ID prophylaxis: -She will continue acyclovir twice daily.  She will continue aspirin 81 mg for thromboprophylaxis.  6.  Breast cancer: -She had a history of right breast cancer in 2000 and June 2007.  Reportedly treated with chemotherapy followed by  1 year of Herceptin.  Did not have any evidence of recurrence at this time.

## 2019-11-24 NOTE — Progress Notes (Signed)
[place], [place] [address]   CLINIC:  Medical Oncology/Hematology  PCP:  [name], MD [place] [address] [phone]   REASON FOR VISIT:  Multiple myeloma.  CURRENT THERAPY: RVD    INTERVAL HISTORY:  [name] 64 y.o. female seen for follow-up and toxicity assessment prior to cycle 5 of Velcade.  She started third bottle of Revlimid on 11/17/2019.  She reports some pressure in the lower back all the time.  She is taking BC powders as needed.  She has diarrhea for 1 day after each Velcade injection.  Imodium helps.  Denies any tingling or numbness in extremities.  REVIEW OF SYSTEMS:  Review of Systems  Musculoskeletal: Positive for back pain.  All other systems reviewed and are negative.    PAST MEDICAL/SURGICAL HISTORY:  Past Medical History:  Diagnosis Date  . Breast cancer ([facility]) 2006   left breast  . Breast mass, right   . Compression fracture of cervical spine ([facility]) 06/18/2019   recent Chest xray stating had fx.  . High cholesterol   . History of bladder infections   . Hypertension   . Rash Aug 2013   Past Surgical History:  Procedure Laterality Date  . ABDOMINAL HYSTERECTOMY     just uterus removed  . BREAST BIOPSY    . BREAST LUMPECTOMY WITH RADIOACTIVE SEED LOCALIZATION Right 07/24/2018   Procedure: RIGHT BREAST LUMPECTOMY WITH RADIOACTIVE SEED LOCALIZATION;  Surgeon: Coralie Keens, MD;  Location: [facility];  Service: General;  Laterality: Right;  . MASTECTOMY  5/06   left  . PARTIAL HYSTERECTOMY    . PORT-A-CATH REMOVAL    . PORTACATH PLACEMENT       SOCIAL HISTORY:  Social History   Socioeconomic History  . Marital status: Married    Spouse name: Not on file  . Number of children: 2  . Years of education: Not on file  . Highest education level: Not on file  Occupational History    Employer: [employer]  Tobacco Use  . Smoking status: Never Smoker  .  Smokeless tobacco: Never Used  Substance and Sexual Activity  . Alcohol use: No  . Drug use: No  . Sexual activity: Yes    Birth control/protection: Surgical  Other Topics Concern  . Not on file  Social History Narrative  . Not on file   Social Determinants of Health   Financial Resource Strain:   . Difficulty of Paying Living Expenses:   Food Insecurity:   . Worried About Charity fundraiser in the Last Year:   . Arboriculturist in the Last Year:   Transportation Needs:   . Film/video editor (Medical):   Marland Kitchen Lack of Transportation (Non-Medical):   Physical Activity:   . Days of Exercise per Week:   . Minutes of Exercise per Session:   Stress:   . Feeling of Stress :   Social Connections:   . Frequency of Communication with Friends and Family:   . Frequency of Social Gatherings with Friends and Family:   . Attends Religious Services:   . Active Member of Clubs or Organizations:   . Attends Archivist Meetings:   Marland Kitchen Marital Status:   Intimate Partner Violence:   . Fear of Current or Ex-Partner:   . Emotionally Abused:   Marland Kitchen Physically Abused:   . Sexually Abused:     FAMILY HISTORY:  Family History  Problem  Relation Age of Onset  . Dementia Mother   . Stomach cancer Father   . Cancer Brother   . Cancer Brother   . Diabetes Brother   . Colitis Daughter   . Hypertension Daughter   . Hypertension Daughter     CURRENT MEDICATIONS:  Outpatient Encounter Medications as of 11/24/2019  Medication Sig  . acyclovir (ZOVIRAX) 400 MG tablet TAKE 1 TABLET BY MOUTH TWICE A DAY  . aspirin EC 81 MG tablet Take 1 tablet (81 mg total) by mouth daily.  . Aspirin-Salicylamide-Caffeine (BC HEADACHE POWDER PO) Take 1 tablet by mouth as needed.   Marland Kitchen atenolol (TENORMIN) 50 MG tablet Take 50 mg by mouth daily.    . bortezomib IV (VELCADE) 3.5 MG injection Inject 2.75 mg into the vein once a week. Days 1, 8, 15 every 21 days   . dexamethasone (DECADRON) 4 MG tablet Take 10  tablets (40 mg) on days 1, 8, and 15 of chemo. Repeat every 21 days.  . ergocalciferol (VITAMIN D2) 1.25 MG (50000 UT) capsule Take 1 capsule (50,000 Units total) by mouth once a week.  . furosemide (LASIX) 20 MG tablet TAKE 1 TABLET BY MOUTH EVERY DAY AS NEEDED  . hydrochlorothiazide 25 MG tablet Take 25 mg by mouth daily.    Marland Kitchen HYDROcodone-acetaminophen (NORCO) 10-325 MG tablet Take 1 tablet by mouth every 8 (eight) hours as needed.  Marland Kitchen lenalidomide (REVLIMID) 25 MG capsule Take one capsule daily on days 1-14 every 21 days.  . potassium chloride (K-DUR) 10 MEQ tablet Take 10 mEq by mouth daily.  . prochlorperazine (COMPAZINE) 10 MG tablet Take 1 tablet (10 mg total) by mouth every 6 (six) hours as needed for nausea or vomiting.  . [DISCONTINUED] HYDROcodone-acetaminophen (NORCO) 10-325 MG tablet ORAL TAKE 1 TABLET UP TO 3 TIMES A DAY FOR SEVERE PAIN.   No facility-administered encounter medications on file as of 11/24/2019.    ALLERGIES:  No Known Allergies   PHYSICAL EXAM:  ECOG Performance status: 1  Vitals:   11/24/19 1000  BP: 139/69  Pulse: 70  Resp: 18  Temp: (!) 97.5 F (36.4 C)  SpO2: 100%   Filed Weights   11/24/19 1000  Weight: 172 lb 2 oz (78.1 kg)    Physical Exam Vitals reviewed.  Constitutional:      Appearance: Normal appearance.  Cardiovascular:     Rate and Rhythm: Normal rate and regular rhythm.     Heart sounds: Normal heart sounds.  Pulmonary:     Effort: Pulmonary effort is normal.     Breath sounds: Normal breath sounds.  Abdominal:     General: There is no distension.     Palpations: Abdomen is soft. There is no mass.  Musculoskeletal:     Right lower leg: Edema present.     Left lower leg: Edema present.  Skin:    General: Skin is warm.  Neurological:     General: No focal deficit present.     Mental Status: She is alert and oriented to person, place, and time.  Psychiatric:        Mood and Affect: Mood normal.        Behavior: Behavior  normal.      LABORATORY DATA:  I have reviewed the labs as listed.  CBC    Component Value Date/Time   WBC 3.9 (L) 11/24/2019 1014   RBC 3.96 11/24/2019 1014   HGB 11.7 (L) 11/24/2019 1014   HCT 36.9 11/24/2019 1014  PLT 179 11/24/2019 1014   MCV 93.2 11/24/2019 1014   MCH 29.5 11/24/2019 1014   MCHC 31.7 11/24/2019 1014   RDW 15.9 (H) 11/24/2019 1014   LYMPHSABS 0.4 (L) 11/24/2019 1014   MONOABS 0.1 11/24/2019 1014   EOSABS 0.2 11/24/2019 1014   BASOSABS 0.0 11/24/2019 1014   CMP Latest Ref Rng & Units 11/24/2019 11/17/2019 11/10/2019  Glucose 70 - 99 mg/dL 94 96 93  BUN 8 - 23 mg/dL 19 15 15   Creatinine 0.44 - 1.00 mg/dL 0.87 0.75 0.77  Sodium 135 - 145 mmol/L 140 140 137  Potassium 3.5 - 5.1 mmol/L 3.6 3.8 3.4(L)  Chloride 98 - 111 mmol/L 105 107 103  CO2 22 - 32 mmol/L 25 25 25   Calcium 8.9 - 10.3 mg/dL 8.7(L) 8.9 8.8(L)  Total Protein 6.5 - 8.1 g/dL 6.2(L) 6.3(L) 6.4(L)  Total Bilirubin 0.3 - 1.2 mg/dL 0.7 0.7 0.7  Alkaline Phos 38 - 126 U/L 147(H) 159(H) 167(H)  AST 15 - 41 U/L 27 17 25   ALT 0 - 44 U/L 22 19 24        DIAGNOSTIC IMAGING:  I have reviewed her scans.     ASSESSMENT & PLAN:   Multiple myeloma (H[facility]) 1.  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  Chromosome analysis was normal.  Myeloma FISH panel was normal. -4 cycles of RVD from 08/19/2019 through 11/03/2019 with Revlimid started on 10/06/2019. -She started her third bottle of Revlimid on 11/17/2019. -She gets diarrhea for 1 day after taking Velcade injection.  Otherwise denies any neuropathy. -We reviewed myeloma panel from 11/17/2019.  SPEP did not reveal any M spike.  Light chain ratio is normal at 1.23.  Lambda light chains are 12.  Immunofixation is negative. -We reviewed her CBC and LFTs which are adequate to proceed with her treatment today. -I will see her back in 3 weeks for follow-up.  She has an  appointment at [facility] in April for possible stem cell collection. -She would like to do a bone marrow transplant after she enrolls on Medicare in October.  2.  Back pain: -Previous MRI of the T-spine showed compression fractures of T8 and T10 with mild to moderate loss of height.  Chronic T12 compression fracture.  MRI of the L-spine showed subacute L1 and L3 compression fractures up to 50% loss of height. -Bone density on 07/21/2019 shows T score -2.2 consistent with osteopenia. -She still has some pressure in the lower back.  She is taking BC powder as needed.  I have told her to stay away from [facility] powders. -I have sent a prescription for hydrocodone 10 mg to be taken as needed.  3.  Hypokalemia: -She will continue potassium 20 mg 3 times daily.  Potassium today is 3.6.  4.  Bone protection: -She had her tooth pulled on 10/26/2019. -Creatinine today 0.87 calcium 8.7.  She will start her Zometa today.  5.  ID prophylaxis: -She will continue acyclovir twice daily.  She will continue aspirin 81 mg for thromboprophylaxis.  6.  Breast cancer: -She had a history of right breast cancer in 2000 and June 2007.  Reportedly treated with chemotherapy followed by 1 year of Herceptin.  Did not have any evidence of recurrence at this time.   Orders placed this encounter:  Orders Placed This Encounter  Procedures  . CBC with Differential/Platelet  . Comprehensive metabolic panel  . Protein electrophoresis, serum  .  Kappa/lambda light chains  . Lactate dehydrogenase      Derek Jack, MD [place] [phone]

## 2019-11-27 ENCOUNTER — Ambulatory Visit: Payer: BC Managed Care – PPO | Attending: Internal Medicine

## 2019-11-27 DIAGNOSIS — Z23 Encounter for immunization: Secondary | ICD-10-CM

## 2019-11-27 NOTE — Progress Notes (Signed)
   Covid-19 Vaccination Clinic  Name:  Stephanie Galvan    MRN: [id number] DOB: [birth year]  11/27/2019  Stephanie Galvan was observed post Covid-19 immunization for 15 minutes without incident. She was provided with Vaccine Information Sheet and instruction to access the V-Safe system.   Stephanie Galvan was instructed to call 911 with any severe reactions post vaccine: Marland Kitchen Difficulty breathing  . Swelling of face and throat  . A fast heartbeat  . A bad rash all over body  . Dizziness and weakness   Immunizations Administered    Name Date Dose VIS Date Route   Moderna COVID-19 Vaccine 11/27/2019 11:27 AM 0.5 mL 08/11/2019 Intramuscular   Manufacturer: Moderna   Lot: [id number]   [id number]

## 2019-12-01 ENCOUNTER — Other Ambulatory Visit: Payer: Self-pay

## 2019-12-01 ENCOUNTER — Other Ambulatory Visit (HOSPITAL_COMMUNITY): Payer: BC Managed Care – PPO

## 2019-12-01 ENCOUNTER — Other Ambulatory Visit (HOSPITAL_COMMUNITY): Payer: Self-pay | Admitting: *Deleted

## 2019-12-01 ENCOUNTER — Inpatient Hospital Stay (HOSPITAL_COMMUNITY): Payer: BC Managed Care – PPO

## 2019-12-01 ENCOUNTER — Ambulatory Visit (HOSPITAL_COMMUNITY): Payer: BC Managed Care – PPO

## 2019-12-01 ENCOUNTER — Encounter (HOSPITAL_COMMUNITY): Payer: Self-pay

## 2019-12-01 VITALS — BP 126/69 | HR 65 | Temp 97.3°F | Resp 18 | Wt 173.4 lb

## 2019-12-01 DIAGNOSIS — C9000 Multiple myeloma not having achieved remission: Secondary | ICD-10-CM

## 2019-12-01 DIAGNOSIS — Z5112 Encounter for antineoplastic immunotherapy: Secondary | ICD-10-CM | POA: Diagnosis not present

## 2019-12-01 DIAGNOSIS — D472 Monoclonal gammopathy: Secondary | ICD-10-CM

## 2019-12-01 LAB — CBC WITH DIFFERENTIAL/PLATELET
Abs Immature Granulocytes: 0.01 10*3/uL (ref 0.00–0.07)
Basophils Absolute: 0 10*3/uL (ref 0.0–0.1)
Basophils Relative: 1 %
Eosinophils Absolute: 0.1 10*3/uL (ref 0.0–0.5)
Eosinophils Relative: 3 %
HCT: 37.1 % (ref 36.0–46.0)
Hemoglobin: 11.8 g/dL — ABNORMAL LOW (ref 12.0–15.0)
Immature Granulocytes: 0 %
Lymphocytes Relative: 9 %
Lymphs Abs: 0.3 10*3/uL — ABNORMAL LOW (ref 0.7–4.0)
MCH: 29.6 pg (ref 26.0–34.0)
MCHC: 31.8 g/dL (ref 30.0–36.0)
MCV: 93.2 fL (ref 80.0–100.0)
Monocytes Absolute: 0.2 10*3/uL (ref 0.1–1.0)
Monocytes Relative: 5 %
Neutro Abs: 3.1 10*3/uL (ref 1.7–7.7)
Neutrophils Relative %: 82 %
Platelets: 184 10*3/uL (ref 150–400)
RBC: 3.98 MIL/uL (ref 3.87–5.11)
RDW: 15.8 % — ABNORMAL HIGH (ref 11.5–15.5)
WBC: 3.8 10*3/uL — ABNORMAL LOW (ref 4.0–10.5)
nRBC: 0 % (ref 0.0–0.2)

## 2019-12-01 LAB — COMPREHENSIVE METABOLIC PANEL
ALT: 75 U/L — ABNORMAL HIGH (ref 0–44)
AST: 77 U/L — ABNORMAL HIGH (ref 15–41)
Albumin: 3.8 g/dL (ref 3.5–5.0)
Alkaline Phosphatase: 188 U/L — ABNORMAL HIGH (ref 38–126)
Anion gap: 8 (ref 5–15)
BUN: 15 mg/dL (ref 8–23)
CO2: 27 mmol/L (ref 22–32)
Calcium: 8.7 mg/dL — ABNORMAL LOW (ref 8.9–10.3)
Chloride: 102 mmol/L (ref 98–111)
Creatinine, Ser: 0.71 mg/dL (ref 0.44–1.00)
GFR calc Af Amer: >60 mL/min (ref >60)
GFR calc non Af Amer: >60 mL/min (ref >60)
Glucose, Bld: 95 mg/dL (ref 70–99)
Potassium: 3.4 mmol/L — ABNORMAL LOW (ref 3.5–5.1)
Sodium: 137 mmol/L (ref 135–145)
Total Bilirubin: 0.8 mg/dL (ref 0.3–1.2)
Total Protein: 6.7 g/dL (ref 6.5–8.1)

## 2019-12-01 MED ORDER — ZOLEDRONIC ACID 4 MG/100ML IV SOLN
4.0000 mg | Freq: Once | INTRAVENOUS | Status: AC
  Administered 2019-12-01: 4 mg via INTRAVENOUS
  Filled 2019-12-01: qty 100

## 2019-12-01 MED ORDER — PROCHLORPERAZINE MALEATE 10 MG PO TABS
10.0000 mg | ORAL_TABLET | Freq: Once | ORAL | Status: DC
  Filled 2019-12-01: qty 1

## 2019-12-01 MED ORDER — BORTEZOMIB CHEMO SQ INJECTION 3.5 MG (2.5MG/ML)
1.3000 mg/m2 | Freq: Once | INTRAMUSCULAR | Status: AC
  Administered 2019-12-01: 2.75 mg via SUBCUTANEOUS
  Filled 2019-12-01: qty 1.1

## 2019-12-01 MED ORDER — LENALIDOMIDE 25 MG PO CAPS: ORAL_CAPSULE | ORAL | 0 refills | Status: DC

## 2019-12-01 MED ORDER — SODIUM CHLORIDE 0.9 % IV SOLN: Freq: Once | INTRAVENOUS | Status: AC

## 2019-12-01 NOTE — Telephone Encounter (Signed)
Chart reviewed, revlimid refilled. 

## 2019-12-01 NOTE — Progress Notes (Signed)
Patient presents today for Zometa and Velcade injection. Vital signs are stable. Labs within parameters for treatment. Patient has no complaints of any changes since her last visit. Consent for Zometa 4mg  obtained today. Patient states she is taking Ergocalciferol 1.25mg  PO as prescribed. Patient is taking Revlimid 25mg  1-14 days every 21 days as prescribed.    Message received from [facility] LPN/ Dr. Delton Coombes labs reviewed, proceed with treatment. Reported to [facility] LPN that patient needs a refill on Revlimid 25 mg. Patient took her last dose yesterday.    Treatment given today per MD orders. Tolerated infusion without adverse affects. Vital signs stable. No complaints at this time. Discharged from clinic ambulatory. F/U with [facility] as scheduled.

## 2019-12-01 NOTE — Patient Instructions (Signed)
[facility] Discharge Instructions for Patients Receiving Chemotherapy  Today you received the following chemotherapy agents   To help prevent nausea and vomiting after your treatment, we encourage you to take your nausea medication   If you develop nausea and vomiting that is not controlled by your nausea medication, call the clinic.   BELOW ARE SYMPTOMS THAT SHOULD BE REPORTED IMMEDIATELY:  *FEVER GREATER THAN 100.5 F  *CHILLS WITH OR WITHOUT FEVER  NAUSEA AND VOMITING THAT IS NOT CONTROLLED WITH YOUR NAUSEA MEDICATION  *UNUSUAL SHORTNESS OF BREATH  *UNUSUAL BRUISING OR BLEEDING  TENDERNESS IN MOUTH AND THROAT WITH OR WITHOUT PRESENCE OF ULCERS  *URINARY PROBLEMS  *BOWEL PROBLEMS  UNUSUAL RASH Items with * indicate a potential emergency and should be followed up as soon as possible.  Feel free to call the clinic should you have any questions or concerns. The clinic phone number is [phone].  Please show the CHEMO ALERT CARD at check-in to the Emergency Department and triage nurse.   

## 2019-12-03 NOTE — Progress Notes (Signed)

## 2019-12-07 ENCOUNTER — Other Ambulatory Visit (HOSPITAL_COMMUNITY): Payer: Self-pay | Admitting: *Deleted

## 2019-12-07 DIAGNOSIS — C9000 Multiple myeloma not having achieved remission: Secondary | ICD-10-CM

## 2019-12-08 ENCOUNTER — Other Ambulatory Visit: Payer: Self-pay

## 2019-12-08 ENCOUNTER — Inpatient Hospital Stay (HOSPITAL_COMMUNITY): Payer: BC Managed Care – PPO

## 2019-12-08 VITALS — BP 141/81 | HR 81 | Temp 97.3°F | Resp 18 | Wt 168.2 lb

## 2019-12-08 DIAGNOSIS — Z5112 Encounter for antineoplastic immunotherapy: Secondary | ICD-10-CM | POA: Diagnosis not present

## 2019-12-08 DIAGNOSIS — D472 Monoclonal gammopathy: Secondary | ICD-10-CM

## 2019-12-08 LAB — COMPREHENSIVE METABOLIC PANEL
ALT: 30 U/L (ref 0–44)
AST: 17 U/L (ref 15–41)
Albumin: 3.8 g/dL (ref 3.5–5.0)
Alkaline Phosphatase: 156 U/L — ABNORMAL HIGH (ref 38–126)
Anion gap: 9 (ref 5–15)
BUN: 8 mg/dL (ref 8–23)
CO2: 23 mmol/L (ref 22–32)
Calcium: 7.2 mg/dL — ABNORMAL LOW (ref 8.9–10.3)
Chloride: 103 mmol/L (ref 98–111)
Creatinine, Ser: 0.69 mg/dL (ref 0.44–1.00)
GFR calc Af Amer: >60 mL/min (ref >60)
GFR calc non Af Amer: >60 mL/min (ref >60)
Glucose, Bld: 90 mg/dL (ref 70–99)
Potassium: 3.7 mmol/L (ref 3.5–5.1)
Sodium: 135 mmol/L (ref 135–145)
Total Bilirubin: 0.7 mg/dL (ref 0.3–1.2)
Total Protein: 6.5 g/dL (ref 6.5–8.1)

## 2019-12-08 LAB — CBC WITH DIFFERENTIAL/PLATELET
Abs Immature Granulocytes: 0 10*3/uL (ref 0.00–0.07)
Basophils Absolute: 0 10*3/uL (ref 0.0–0.1)
Basophils Relative: 1 %
Eosinophils Absolute: 0.1 10*3/uL (ref 0.0–0.5)
Eosinophils Relative: 3 %
HCT: 38.3 % (ref 36.0–46.0)
Hemoglobin: 12.2 g/dL (ref 12.0–15.0)
Immature Granulocytes: 0 %
Lymphocytes Relative: 24 %
Lymphs Abs: 0.8 10*3/uL (ref 0.7–4.0)
MCH: 29.8 pg (ref 26.0–34.0)
MCHC: 31.9 g/dL (ref 30.0–36.0)
MCV: 93.4 fL (ref 80.0–100.0)
Monocytes Absolute: 0.6 10*3/uL (ref 0.1–1.0)
Monocytes Relative: 19 %
Neutro Abs: 1.6 10*3/uL — ABNORMAL LOW (ref 1.7–7.7)
Neutrophils Relative %: 53 %
Platelets: 203 10*3/uL (ref 150–400)
RBC: 4.1 MIL/uL (ref 3.87–5.11)
RDW: 15.8 % — ABNORMAL HIGH (ref 11.5–15.5)
WBC: 3.1 10*3/uL — ABNORMAL LOW (ref 4.0–10.5)
nRBC: 0 % (ref 0.0–0.2)

## 2019-12-08 MED ORDER — BORTEZOMIB CHEMO SQ INJECTION 3.5 MG (2.5MG/ML)
1.3000 mg/m2 | Freq: Once | INTRAMUSCULAR | Status: AC
  Administered 2019-12-08: 2.75 mg via SUBCUTANEOUS
  Filled 2019-12-08: qty 1.1

## 2019-12-08 MED ORDER — PROCHLORPERAZINE MALEATE 10 MG PO TABS: 10.0000 mg | ORAL_TABLET | Freq: Once | ORAL | Status: DC

## 2019-12-08 NOTE — Patient Instructions (Signed)
[facility] Discharge Instructions for Patients Receiving Chemotherapy   Beginning January 23rd 2017 lab work for the Cancer Center will be done in the  Main lab at [facility] on 1st floor. If you have a lab appointment with the Cancer Center please come in thru the  Main Entrance and check in at the main information desk   Today you received the following chemotherapy agents Velcade  To help prevent nausea and vomiting after your treatment, we encourage you to take your nausea medication    If you develop nausea and vomiting, or diarrhea that is not controlled by your medication, call the clinic.  The clinic phone number is [phone]. Office hours are Monday-Friday 8:30am-5:00pm.  BELOW ARE SYMPTOMS THAT SHOULD BE REPORTED IMMEDIATELY:  *FEVER GREATER THAN 101.0 F  *CHILLS WITH OR WITHOUT FEVER  NAUSEA AND VOMITING THAT IS NOT CONTROLLED WITH YOUR NAUSEA MEDICATION  *UNUSUAL SHORTNESS OF BREATH  *UNUSUAL BRUISING OR BLEEDING  TENDERNESS IN MOUTH AND THROAT WITH OR WITHOUT PRESENCE OF ULCERS  *URINARY PROBLEMS  *BOWEL PROBLEMS  UNUSUAL RASH Items with * indicate a potential emergency and should be followed up as soon as possible. If you have an emergency after office hours please contact your primary care physician or go to the nearest emergency department.  Please call the clinic during office hours if you have any questions or concerns.   You may also contact the Patient Navigator at [phone] should you have any questions or need assistance in obtaining follow up care.      Resources For Cancer Patients and their Caregivers ? American Cancer Society: Can assist with transportation, wigs, general needs, runs Look Good Feel Better.        1-888-227-6333 ? Cancer Care: Provides financial assistance, online support groups, medication/co-pay assistance.  1-800-813-HOPE (4673) ? [facility] Assists [place] cancer  patients and their families through emotional , educational and financial support.  336-427-4357 ? [place] DSS Where to apply for food stamps, Medicaid and utility assistance. 336-342-1394 ? [facility]: Transportation to medical appointments. 336-347-2287 ? Social Security Administration: May apply for disability if have a Stage IV cancer. 336-342-7796 1-800-772-1213 ? [place] Aging, Disability and Transit Services: Assists with nutrition, care and transit needs. 336-349-2343          

## 2019-12-08 NOTE — Progress Notes (Signed)
Stephanie Galvan presents today for Velcade injection. VSS, lab results reviewed. Ca 7.2. Pt states she has been taking her Tums 1000mg  a day. Understanding verbalized. Pt reports she has been having increasing diarrhea and bloating/gas pains after her shots. I spoke with Francene Finders, NP about this and she states to have the pt take Imodium 4 times a day after each bowel movement, BeanO before every meal to help prevent the gas. She also said to have the pt start taking calcium supplements twice a day instead of the Tums. Pt verbalized understanding to all instructions. Pt instructed to let us know if this does not help. Velcade injection administered into right abdomen without incident or complaint. Discharged in satisfactory condition with follow up instructions.

## 2019-12-15 ENCOUNTER — Inpatient Hospital Stay (HOSPITAL_COMMUNITY): Payer: BC Managed Care – PPO | Attending: Hematology

## 2019-12-15 ENCOUNTER — Encounter (HOSPITAL_COMMUNITY): Payer: Self-pay | Admitting: Hematology

## 2019-12-15 ENCOUNTER — Inpatient Hospital Stay (HOSPITAL_BASED_OUTPATIENT_CLINIC_OR_DEPARTMENT_OTHER): Payer: BC Managed Care – PPO | Admitting: Hematology

## 2019-12-15 ENCOUNTER — Inpatient Hospital Stay (HOSPITAL_COMMUNITY): Payer: BC Managed Care – PPO

## 2019-12-15 ENCOUNTER — Other Ambulatory Visit: Payer: Self-pay

## 2019-12-15 VITALS — BP 151/81 | HR 67 | Temp 97.1°F | Resp 16 | Wt 161.7 lb

## 2019-12-15 DIAGNOSIS — Z79899 Other long term (current) drug therapy: Secondary | ICD-10-CM | POA: Diagnosis not present

## 2019-12-15 DIAGNOSIS — Z9221 Personal history of antineoplastic chemotherapy: Secondary | ICD-10-CM | POA: Insufficient documentation

## 2019-12-15 DIAGNOSIS — R197 Diarrhea, unspecified: Secondary | ICD-10-CM | POA: Insufficient documentation

## 2019-12-15 DIAGNOSIS — C9000 Multiple myeloma not having achieved remission: Secondary | ICD-10-CM

## 2019-12-15 DIAGNOSIS — R200 Anesthesia of skin: Secondary | ICD-10-CM | POA: Insufficient documentation

## 2019-12-15 DIAGNOSIS — Z818 Family history of other mental and behavioral disorders: Secondary | ICD-10-CM | POA: Diagnosis not present

## 2019-12-15 DIAGNOSIS — R112 Nausea with vomiting, unspecified: Secondary | ICD-10-CM | POA: Diagnosis not present

## 2019-12-15 DIAGNOSIS — Z7982 Long term (current) use of aspirin: Secondary | ICD-10-CM | POA: Insufficient documentation

## 2019-12-15 DIAGNOSIS — Z833 Family history of diabetes mellitus: Secondary | ICD-10-CM | POA: Insufficient documentation

## 2019-12-15 DIAGNOSIS — Z809 Family history of malignant neoplasm, unspecified: Secondary | ICD-10-CM | POA: Insufficient documentation

## 2019-12-15 DIAGNOSIS — Z596 Low income: Secondary | ICD-10-CM | POA: Insufficient documentation

## 2019-12-15 DIAGNOSIS — E876 Hypokalemia: Secondary | ICD-10-CM | POA: Diagnosis not present

## 2019-12-15 DIAGNOSIS — Z7952 Long term (current) use of systemic steroids: Secondary | ICD-10-CM | POA: Insufficient documentation

## 2019-12-15 DIAGNOSIS — D472 Monoclonal gammopathy: Secondary | ICD-10-CM

## 2019-12-15 DIAGNOSIS — M545 Low back pain: Secondary | ICD-10-CM | POA: Diagnosis not present

## 2019-12-15 DIAGNOSIS — Z800 Family history of malignant neoplasm of digestive organs: Secondary | ICD-10-CM | POA: Insufficient documentation

## 2019-12-15 DIAGNOSIS — Z8249 Family history of ischemic heart disease and other diseases of the circulatory system: Secondary | ICD-10-CM | POA: Diagnosis not present

## 2019-12-15 DIAGNOSIS — R5383 Other fatigue: Secondary | ICD-10-CM | POA: Insufficient documentation

## 2019-12-15 DIAGNOSIS — Z5112 Encounter for antineoplastic immunotherapy: Secondary | ICD-10-CM | POA: Diagnosis not present

## 2019-12-15 DIAGNOSIS — Z8379 Family history of other diseases of the digestive system: Secondary | ICD-10-CM | POA: Insufficient documentation

## 2019-12-15 DIAGNOSIS — R140 Abdominal distension (gaseous): Secondary | ICD-10-CM | POA: Diagnosis not present

## 2019-12-15 LAB — CBC WITH DIFFERENTIAL/PLATELET
Abs Immature Granulocytes: 0 10*3/uL (ref 0.00–0.07)
Basophils Absolute: 0 10*3/uL (ref 0.0–0.1)
Basophils Relative: 1 %
Eosinophils Absolute: 0.2 10*3/uL (ref 0.0–0.5)
Eosinophils Relative: 6 %
HCT: 38.3 % (ref 36.0–46.0)
Hemoglobin: 12.6 g/dL (ref 12.0–15.0)
Immature Granulocytes: 0 %
Lymphocytes Relative: 12 %
Lymphs Abs: 0.3 10*3/uL — ABNORMAL LOW (ref 0.7–4.0)
MCH: 30.4 pg (ref 26.0–34.0)
MCHC: 32.9 g/dL (ref 30.0–36.0)
MCV: 92.5 fL (ref 80.0–100.0)
Monocytes Absolute: 0.3 10*3/uL (ref 0.1–1.0)
Monocytes Relative: 10 %
Neutro Abs: 1.9 10*3/uL (ref 1.7–7.7)
Neutrophils Relative %: 71 %
Platelets: 181 10*3/uL (ref 150–400)
RBC: 4.14 MIL/uL (ref 3.87–5.11)
RDW: 16.1 % — ABNORMAL HIGH (ref 11.5–15.5)
WBC: 2.7 10*3/uL — ABNORMAL LOW (ref 4.0–10.5)
nRBC: 0 % (ref 0.0–0.2)

## 2019-12-15 LAB — COMPREHENSIVE METABOLIC PANEL
ALT: 17 U/L (ref 0–44)
AST: 18 U/L (ref 15–41)
Albumin: 4.1 g/dL (ref 3.5–5.0)
Alkaline Phosphatase: 121 U/L (ref 38–126)
Anion gap: 11 (ref 5–15)
BUN: 13 mg/dL (ref 8–23)
CO2: 26 mmol/L (ref 22–32)
Calcium: 8.8 mg/dL — ABNORMAL LOW (ref 8.9–10.3)
Chloride: 98 mmol/L (ref 98–111)
Creatinine, Ser: 0.97 mg/dL (ref 0.44–1.00)
GFR calc Af Amer: >60 mL/min (ref >60)
GFR calc non Af Amer: >60 mL/min (ref >60)
Glucose, Bld: 103 mg/dL — ABNORMAL HIGH (ref 70–99)
Potassium: 3.2 mmol/L — ABNORMAL LOW (ref 3.5–5.1)
Sodium: 135 mmol/L (ref 135–145)
Total Bilirubin: 1.2 mg/dL (ref 0.3–1.2)
Total Protein: 7 g/dL (ref 6.5–8.1)

## 2019-12-15 LAB — LACTATE DEHYDROGENASE: LDH: 130 U/L (ref 98–192)

## 2019-12-15 MED ORDER — BORTEZOMIB CHEMO SQ INJECTION 3.5 MG (2.5MG/ML)
1.3000 mg/m2 | Freq: Once | INTRAMUSCULAR | Status: AC
  Administered 2019-12-15: 12:00:00 2.75 mg via SUBCUTANEOUS
  Filled 2019-12-15: qty 1.1

## 2019-12-15 MED ORDER — PROCHLORPERAZINE MALEATE 10 MG PO TABS: 10.0000 mg | ORAL_TABLET | Freq: Once | ORAL | Status: DC

## 2019-12-15 NOTE — Patient Instructions (Signed)
[facility] Discharge Instructions for Patients Receiving Chemotherapy   Beginning January 23rd 2017 lab work for the Cancer Center will be done in the  Main lab at  on 1st floor. If you have a lab appointment with the Cancer Center please come in thru the  Main Entrance and check in at the main information desk   Today you received the following chemotherapy agents Velcade  To help prevent nausea and vomiting after your treatment, we encourage you to take your nausea medication    If you develop nausea and vomiting, or diarrhea that is not controlled by your medication, call the clinic.  The clinic phone number is [phone]. Office hours are Monday-Friday 8:30am-5:00pm.  BELOW ARE SYMPTOMS THAT SHOULD BE REPORTED IMMEDIATELY:  *FEVER GREATER THAN 101.0 F  *CHILLS WITH OR WITHOUT FEVER  NAUSEA AND VOMITING THAT IS NOT CONTROLLED WITH YOUR NAUSEA MEDICATION  *UNUSUAL SHORTNESS OF BREATH  *UNUSUAL BRUISING OR BLEEDING  TENDERNESS IN MOUTH AND THROAT WITH OR WITHOUT PRESENCE OF ULCERS  *URINARY PROBLEMS  *BOWEL PROBLEMS  UNUSUAL RASH Items with * indicate a potential emergency and should be followed up as soon as possible. If you have an emergency after office hours please contact your primary care physician or go to the nearest emergency department.  Please call the clinic during office hours if you have any questions or concerns.   You may also contact the Patient Navigator at [phone] should you have any questions or need assistance in obtaining follow up care.      Resources For Cancer Patients and their Caregivers ? American Cancer Society: Can assist with transportation, wigs, general needs, runs Look Good Feel Better.        1-888-227-6333 ? Cancer Care: Provides financial assistance, online support groups, medication/co-pay assistance.  1-800-813-HOPE (4673) ? [facility] Assists [place] cancer  patients and their families through emotional , educational and financial support.  336-427-4357 ? [place] DSS Where to apply for food stamps, Medicaid and utility assistance. 336-342-1394 ? [facility]: Transportation to medical appointments. 336-347-2287 ? Social Security Administration: May apply for disability if have a Stage IV cancer. 336-342-7796 1-800-772-1213 ? [place] Aging, Disability and Transit Services: Assists with nutrition, care and transit needs. 336-349-2343          

## 2019-12-15 NOTE — Progress Notes (Signed)
[place] [place], [place] [address]   CLINIC:  Medical Oncology/Hematology  PCP:  [name], MD [place] [address] [phone]   REASON FOR VISIT:  Multiple myeloma.  CURRENT THERAPY: RVD    INTERVAL HISTORY:  Ms. Hulet 64 y.o. female seen for follow-up and toxicity assessment prior to next cycle of Velcade.  Appetite is 25%.  Energy levels are 25%.  Started taking fourth bottle of Revlimid on 12/08/2019.  She is tolerating Revlimid very well.  However she gets diarrhea the next day after each Velcade injection.  She also reported gas and pressure-like sensation in the epigastric and left upper quadrant for the last 1 month.  She has tried taking Gas-X and Beano which did not help.  She thinks it is coming from [place].  Denies any tingling or numbness in the extremities.  However had some spasms in the hands for the last couple of days.  Back pain is fairly well controlled.  REVIEW OF SYSTEMS:  Review of Systems  Gastrointestinal: Positive for diarrhea and vomiting.  Musculoskeletal: Positive for back pain.  All other systems reviewed and are negative.    PAST MEDICAL/SURGICAL HISTORY:  Past Medical History:  Diagnosis Date  . Breast cancer ([facility]) 2006   left breast  . Breast mass, right   . Compression fracture of cervical spine ([facility]) 06/18/2019   recent Chest xray stating had fx.  . High cholesterol   . History of bladder infections   . Hypertension   . Rash Aug 2013   Past Surgical History:  Procedure Laterality Date  . ABDOMINAL HYSTERECTOMY     just uterus removed  . BREAST BIOPSY    . BREAST LUMPECTOMY WITH RADIOACTIVE SEED LOCALIZATION Right 07/24/2018   Procedure: RIGHT BREAST LUMPECTOMY WITH RADIOACTIVE SEED LOCALIZATION;  Surgeon: Coralie Keens, MD;  Location: [facility];  Service: General;  Laterality: Right;  . MASTECTOMY  5/06   left  . PARTIAL HYSTERECTOMY    . PORT-A-CATH  REMOVAL    . PORTACATH PLACEMENT       SOCIAL HISTORY:  Social History   Socioeconomic History  . Marital status: Married    Spouse name: Not on file  . Number of children: 2  . Years of education: Not on file  . Highest education level: Not on file  Occupational History    Employer: [employer]  Tobacco Use  . Smoking status: Never Smoker  . Smokeless tobacco: Never Used  Substance and Sexual Activity  . Alcohol use: No  . Drug use: No  . Sexual activity: Yes    Birth control/protection: Surgical  Other Topics Concern  . Not on file  Social History Narrative  . Not on file   Social Determinants of Health   Financial Resource Strain:   . Difficulty of Paying Living Expenses:   Food Insecurity:   . Worried About Charity fundraiser in the Last Year:   . Arboriculturist in the Last Year:   Transportation Needs:   . Film/video editor (Medical):   Marland Kitchen Lack of Transportation (Non-Medical):   Physical Activity:   . Days of Exercise per Week:   . Minutes of Exercise per Session:   Stress:   . Feeling of Stress :   Social Connections:   . Frequency of Communication with Friends and Family:   . Frequency of Social Gatherings with Friends and Family:   . Attends  Religious Services:   . Active Member of Clubs or Organizations:   . Attends Archivist Meetings:   Marland Kitchen Marital Status:   Intimate Partner Violence:   . Fear of Current or Ex-Partner:   . Emotionally Abused:   Marland Kitchen Physically Abused:   . Sexually Abused:     FAMILY HISTORY:  Family History  Problem Relation Age of Onset  . Dementia Mother   . Stomach cancer Father   . Cancer Brother   . Cancer Brother   . Diabetes Brother   . Colitis Daughter   . Hypertension Daughter   . Hypertension Daughter     CURRENT MEDICATIONS:  Outpatient Encounter Medications as of 12/15/2019  Medication Sig  . acyclovir (ZOVIRAX) 400 MG tablet TAKE 1 TABLET BY MOUTH TWICE A DAY  . aspirin EC 81 MG tablet Take  1 tablet (81 mg total) by mouth daily.  Marland Kitchen atenolol (TENORMIN) 50 MG tablet Take 50 mg by mouth daily.    . bortezomib IV (VELCADE) 3.5 MG injection Inject 2.75 mg into the vein once a week. Days 1, 8, 15 every 21 days   . calcium carbonate (TUMS - DOSED IN MG ELEMENTAL CALCIUM) 500 MG chewable tablet Chew 2 tablets by mouth 2 (two) times daily.  Marland Kitchen dexamethasone (DECADRON) 4 MG tablet Take 10 tablets (40 mg) on days 1, 8, and 15 of chemo. Repeat every 21 days.  . ergocalciferol (VITAMIN D2) 1.25 MG (50000 UT) capsule Take 1 capsule (50,000 Units total) by mouth once a week.  . hydrochlorothiazide 25 MG tablet Take 25 mg by mouth daily.    Marland Kitchen lenalidomide (REVLIMID) 25 MG capsule Take one capsule daily on days 1-14 every 21 days.  . potassium chloride (K-DUR) 10 MEQ tablet Take 10 mEq by mouth daily.  . Aspirin-Salicylamide-Caffeine (BC HEADACHE POWDER PO) Take 1 tablet by mouth as needed.   . furosemide (LASIX) 20 MG tablet TAKE 1 TABLET BY MOUTH EVERY DAY AS NEEDED (Patient not taking: Reported on 12/15/2019)  . HYDROcodone-acetaminophen (NORCO) 10-325 MG tablet Take 1 tablet by mouth every 8 (eight) hours as needed. (Patient not taking: Reported on 12/15/2019)  . prochlorperazine (COMPAZINE) 10 MG tablet Take 1 tablet (10 mg total) by mouth every 6 (six) hours as needed for nausea or vomiting. (Patient not taking: Reported on 12/15/2019)   No facility-administered encounter medications on file as of 12/15/2019.    ALLERGIES:  No Known Allergies   PHYSICAL EXAM:  ECOG Performance status: 1  Vitals:   12/15/19 1059  BP: (!) 151/81  Pulse: 67  Resp: 16  Temp: (!) 97.1 F (36.2 C)  SpO2: 100%   Filed Weights   12/15/19 1059  Weight: 161 lb 11.2 oz (73.3 kg)    Physical Exam Vitals reviewed.  Constitutional:      Appearance: Normal appearance.  Cardiovascular:     Rate and Rhythm: Normal rate and regular rhythm.     Heart sounds: Normal heart sounds.  Pulmonary:     Effort: Pulmonary  effort is normal.     Breath sounds: Normal breath sounds.  Abdominal:     General: There is no distension.     Palpations: Abdomen is soft. There is no mass.  Musculoskeletal:     Right lower leg: Edema present.     Left lower leg: Edema present.  Skin:    General: Skin is warm.  Neurological:     General: No focal deficit present.     Mental  Status: She is alert and oriented to person, place, and time.  Psychiatric:        Mood and Affect: Mood normal.        Behavior: Behavior normal.      LABORATORY DATA:  I have reviewed the labs as listed.  CBC    Component Value Date/Time   WBC 2.7 (L) 12/15/2019 0948   RBC 4.14 12/15/2019 0948   HGB 12.6 12/15/2019 0948   HCT 38.3 12/15/2019 0948   PLT 181 12/15/2019 0948   MCV 92.5 12/15/2019 0948   MCH 30.4 12/15/2019 0948   MCHC 32.9 12/15/2019 0948   RDW 16.1 (H) 12/15/2019 0948   LYMPHSABS 0.3 (L) 12/15/2019 0948   MONOABS 0.3 12/15/2019 0948   EOSABS 0.2 12/15/2019 0948   BASOSABS 0.0 12/15/2019 0948   CMP Latest Ref Rng & Units 12/15/2019 12/08/2019 12/01/2019  Glucose 70 - 99 mg/dL 103(H) 90 95  BUN 8 - 23 mg/dL 13 8 15   Creatinine 0.44 - 1.00 mg/dL 0.97 0.69 0.71  Sodium 135 - 145 mmol/L 135 135 137  Potassium 3.5 - 5.1 mmol/L 3.2(L) 3.7 3.4(L)  Chloride 98 - 111 mmol/L 98 103 102  CO2 22 - 32 mmol/L 26 23 27   Calcium 8.9 - 10.3 mg/dL 8.8(L) 7.2(L) 8.7(L)  Total Protein 6.5 - 8.1 g/dL 7.0 6.5 6.7  Total Bilirubin 0.3 - 1.2 mg/dL 1.2 0.7 0.8  Alkaline Phos 38 - 126 U/L 121 156(H) 188(H)  AST 15 - 41 U/L 18 17 77(H)  ALT 0 - 44 U/L 17 30 75(H)       DIAGNOSTIC IMAGING:  I have reviewed her scans.     ASSESSMENT & PLAN:   Multiple myeloma ([facility]) 1.  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  Chromosome analysis was normal.  Myeloma FISH panel was normal. -5 cycles of RVD from 08/19/2019 through 11/24/2019.  Revlimid  started on 10/06/2019. -She started fourth bottle of Revlimid on 12/08/2019. -We reviewed myeloma panel from 11/17/2019.  SPEP is negative.  Immunofixation was normal.  Free light chain ratio was 1.23. -She has a follow-up appointment with [facility] transplant team on 12/30/2019.  She will have blood drawn prior to that. -She reported diarrhea 1 day after each Velcade injection.  Lately she is also having bloating sensation in the abdomen all the time.  She has tried Gas-X and Beano without much help.  She had vomited twice last week the day after Zometa infusion. -We reviewed her labs from today.  White count is 2.7 with ANC of 1.9.  Hemoglobin was 12.6 and platelet count was normal.  Creatinine was normal. -She will proceed with her Velcade today.  I plan to see her back in 3-4 weeks for follow-up.  2.  Back pain: -MRI of the T-spine showed compression fractures of T8, T10.  Chronic T12 compression fracture.  MRI of the L-spine showed subacute L1 and L3 compression fractures. -Bone density on 07/21/2019 shows T score -2.2 consistent with osteopenia. -She complains of pressure like sensation in the lower back.  She is taking hydrocodone 10 mg as needed which is helping.  3.  Hypokalemia: -She is taking potassium 20 mEq 3 times a day.  Potassium today is 3.2.  4.  Bone protection: -She had dental work done on 10/26/2019. -She received first dose of Zometa on 11/24/2019.  She developed nausea and vomited twice the day after Zometa.  We do not know if it is related to Zometa.  5.  ID prophylaxis: -She will continue acyclovir twice daily.  She will continue aspirin 81 mg for thromboprophylaxis.  6.  Breast cancer: -History of right breast cancer in 2000 and June 2007.  Treated with chemotherapy followed by 1 year of Herceptin.  Did not have any evidence of recurrence at this time.   Orders placed this encounter:  Orders Placed This Encounter  Procedures  . Lactate dehydrogenase  . Protein  electrophoresis, serum  . Kappa/lambda light chains      Derek Jack, [place] [phone]

## 2019-12-15 NOTE — Assessment & Plan Note (Signed)
1.  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  Chromosome analysis was normal.  Myeloma FISH panel was normal. -5 cycles of RVD from 08/19/2019 through 11/24/2019.  Revlimid started on 10/06/2019. -She started fourth bottle of Revlimid on 12/08/2019. -We reviewed myeloma panel from 11/17/2019.  SPEP is negative.  Immunofixation was normal.  Free light chain ratio was 1.23. -She has a follow-up appointment with [facility] transplant team on 12/30/2019.  She will have blood drawn prior to that. -She reported diarrhea 1 day after each Velcade injection.  Lately she is also having bloating sensation in the abdomen all the time.  She has tried Gas-X and Beano without much help.  She had vomited twice last week the day after Zometa infusion. -We reviewed her labs from today.  White count is 2.7 with ANC of 1.9.  Hemoglobin was 12.6 and platelet count was normal.  Creatinine was normal. -She will proceed with her Velcade today.  I plan to see her back in 3-4 weeks for follow-up.  2.  Back pain: -MRI of the T-spine showed compression fractures of T8, T10.  Chronic T12 compression fracture.  MRI of the L-spine showed subacute L1 and L3 compression fractures. -Bone density on 07/21/2019 shows T score -2.2 consistent with osteopenia. -She complains of pressure like sensation in the lower back.  She is taking hydrocodone 10 mg as needed which is helping.  3.  Hypokalemia: -She is taking potassium 20 mEq 3 times a day.  Potassium today is 3.2.  4.  Bone protection: -She had dental work done on 10/26/2019. -She received first dose of Zometa on 11/24/2019.  She developed nausea and vomited twice the day after Zometa.  We do not know if it is related to Zometa.  5.  ID prophylaxis: -She will continue acyclovir twice daily.  She will continue aspirin 81 mg for thromboprophylaxis.  6.  Breast cancer: -History of right breast cancer  in 2000 and June 2007.  Treated with chemotherapy followed by 1 year of Herceptin.  Did not have any evidence of recurrence at this time.

## 2019-12-15 NOTE — Progress Notes (Signed)
1130- lab results and vitals reviewed and patient seen by Dr. Delton Coombes who approved patient for treatment today.   Pt reports her diarrhea has been better but her bloating and cramping is the same. She states the beano did not work. She talked to Dr. Raliegh Ip about this as well. Stephanie Galvan tolerated Velcade injection into left abdomen without incident or complaint. Discharged in satisfactory condition with follow up instructions.

## 2019-12-15 NOTE — Patient Instructions (Addendum)
[place] at [facility] Discharge Instructions  You were seen today by Dr. Delton Coombes. He went over your recent lab results. Some of your lab results are still pending.  You will receive your Velcade injection today.  He will see you back in 3 weeks for labs and follow up.   Thank you for choosing [facility] at [facility] to provide your oncology and hematology care.  To afford each patient quality time with our provider, please arrive at least 15 minutes before your scheduled appointment time.   If you have a lab appointment with the [facility] please come in thru the  Main Entrance and check in at the main information desk  You need to re-schedule your appointment should you arrive 10 or more minutes late.  We strive to give you quality time with our providers, and arriving late affects you and other patients whose appointments are after yours.  Also, if you no show three or more times for appointments you may be dismissed from the clinic at the providers discretion.     Again, thank you for choosing [facility].  Our hope is that these requests will decrease the amount of time that you wait before being seen by our physicians.       _____________________________________________________________  Should you have questions after your visit to [facility], please contact our office at [phone] between the hours of 8:00 a.m. and 4:30 p.m.  Voicemails left after 4:00 p.m. will not be returned until the following business day.  For prescription refill requests, have your pharmacy contact our office and allow 72 hours.    Cancer Center Support Programs:   > Cancer Support Group  2nd Tuesday of the month 1pm-2pm, Journey Room

## 2019-12-16 LAB — PROTEIN ELECTROPHORESIS, SERUM
A/G Ratio: 1.4 (ref 0.7–1.7)
Albumin ELP: 3.8 g/dL (ref 2.9–4.4)
Alpha-1-Globulin: 0.2 g/dL (ref 0.0–0.4)
Alpha-2-Globulin: 0.8 g/dL (ref 0.4–1.0)
Beta Globulin: 1.1 g/dL (ref 0.7–1.3)
Gamma Globulin: 0.5 g/dL (ref 0.4–1.8)
Globulin, Total: 2.7 g/dL (ref 2.2–3.9)
Total Protein ELP: 6.5 g/dL (ref 6.0–8.5)

## 2019-12-16 LAB — KAPPA/LAMBDA LIGHT CHAINS
Kappa free light chain: 13.6 mg/L (ref 3.3–19.4)
Kappa, lambda light chain ratio: 1.36 (ref 0.26–1.65)
Lambda free light chains: 10 mg/L (ref 5.7–26.3)

## 2019-12-16 NOTE — Progress Notes (Signed)

## 2019-12-17 ENCOUNTER — Other Ambulatory Visit (HOSPITAL_COMMUNITY): Payer: Self-pay | Admitting: *Deleted

## 2019-12-17 DIAGNOSIS — D472 Monoclonal gammopathy: Secondary | ICD-10-CM

## 2019-12-17 MED ORDER — LENALIDOMIDE 25 MG PO CAPS: ORAL_CAPSULE | ORAL | 0 refills | Status: DC

## 2019-12-21 ENCOUNTER — Encounter (HOSPITAL_COMMUNITY): Payer: Self-pay | Admitting: *Deleted

## 2019-12-21 ENCOUNTER — Other Ambulatory Visit (HOSPITAL_COMMUNITY): Payer: Self-pay | Admitting: *Deleted

## 2019-12-21 MED ORDER — POTASSIUM CHLORIDE ER 10 MEQ PO TBCR: 20.0000 meq | EXTENDED_RELEASE_TABLET | Freq: Three times a day (TID) | ORAL | 1 refills | Status: DC

## 2019-12-21 NOTE — Progress Notes (Signed)
Patient called clinic wanting to know if she can come off the velcade for a while. She is having so much trouble with the side effects.  She is having excessive bloating feeling and abdominal cramping. She has diarrhea that she says comes and goes. She is not having any good days, she is still feeling poorly today and is coming tomorrow for her next velcade injection. She states that today she has felt a little light headed.    I advised for her to increase her fluid intake and still come for labs tomorrow.  Per Dr. Delton Coombes, we will hold her velcade injections until her next follow up with him which is scheduled for 4/28.  She will see [facility] in 2 weeks and then follow up with [place] and at that time Dr. Delton Coombes will discuss restarted the velcade.  She knows that during this time she is to continue taking her revlimid.  She voices that this coming week is her week off.  She was educated on directions for the imodium and she verbalizes understanding of all the above.    She reports at this time needing a refill on her potassium, that has been sent to her pharmacy.

## 2019-12-21 NOTE — Telephone Encounter (Signed)
Potassium refilled based on Dr. Tomie China previous office note.

## 2019-12-22 ENCOUNTER — Ambulatory Visit (HOSPITAL_COMMUNITY): Payer: BC Managed Care – PPO

## 2019-12-22 ENCOUNTER — Inpatient Hospital Stay (HOSPITAL_COMMUNITY): Payer: BC Managed Care – PPO

## 2019-12-22 DIAGNOSIS — C9000 Multiple myeloma not having achieved remission: Secondary | ICD-10-CM

## 2019-12-22 DIAGNOSIS — Z5112 Encounter for antineoplastic immunotherapy: Secondary | ICD-10-CM | POA: Diagnosis not present

## 2019-12-22 LAB — CBC WITH DIFFERENTIAL/PLATELET
Abs Immature Granulocytes: 0.01 10*3/uL (ref 0.00–0.07)
Basophils Absolute: 0 10*3/uL (ref 0.0–0.1)
Basophils Relative: 1 %
Eosinophils Absolute: 0.3 10*3/uL (ref 0.0–0.5)
Eosinophils Relative: 7 %
HCT: 40.8 % (ref 36.0–46.0)
Hemoglobin: 13.2 g/dL (ref 12.0–15.0)
Immature Granulocytes: 0 %
Lymphocytes Relative: 17 %
Lymphs Abs: 0.7 10*3/uL (ref 0.7–4.0)
MCH: 29.9 pg (ref 26.0–34.0)
MCHC: 32.4 g/dL (ref 30.0–36.0)
MCV: 92.5 fL (ref 80.0–100.0)
Monocytes Absolute: 0.5 10*3/uL (ref 0.1–1.0)
Monocytes Relative: 13 %
Neutro Abs: 2.4 10*3/uL (ref 1.7–7.7)
Neutrophils Relative %: 62 %
Platelets: 197 10*3/uL (ref 150–400)
RBC: 4.41 MIL/uL (ref 3.87–5.11)
RDW: 15.5 % (ref 11.5–15.5)
WBC: 3.9 10*3/uL — ABNORMAL LOW (ref 4.0–10.5)
nRBC: 0 % (ref 0.0–0.2)

## 2019-12-22 LAB — COMPREHENSIVE METABOLIC PANEL
ALT: 26 U/L (ref 0–44)
AST: 28 U/L (ref 15–41)
Albumin: 4.3 g/dL (ref 3.5–5.0)
Alkaline Phosphatase: 115 U/L (ref 38–126)
Anion gap: 13 (ref 5–15)
BUN: 15 mg/dL (ref 8–23)
CO2: 29 mmol/L (ref 22–32)
Calcium: 9 mg/dL (ref 8.9–10.3)
Chloride: 90 mmol/L — ABNORMAL LOW (ref 98–111)
Creatinine, Ser: 0.91 mg/dL (ref 0.44–1.00)
GFR calc Af Amer: >60 mL/min (ref >60)
GFR calc non Af Amer: >60 mL/min (ref >60)
Glucose, Bld: 154 mg/dL — ABNORMAL HIGH (ref 70–99)
Potassium: 2.4 mmol/L — CL (ref 3.5–5.1)
Sodium: 132 mmol/L — ABNORMAL LOW (ref 135–145)
Total Bilirubin: 1.5 mg/dL — ABNORMAL HIGH (ref 0.3–1.2)
Total Protein: 6.5 g/dL (ref 6.5–8.1)

## 2019-12-23 ENCOUNTER — Inpatient Hospital Stay (HOSPITAL_COMMUNITY): Payer: BC Managed Care – PPO

## 2019-12-23 ENCOUNTER — Other Ambulatory Visit: Payer: Self-pay

## 2019-12-23 ENCOUNTER — Other Ambulatory Visit (HOSPITAL_COMMUNITY): Payer: Self-pay | Admitting: *Deleted

## 2019-12-23 DIAGNOSIS — C9000 Multiple myeloma not having achieved remission: Secondary | ICD-10-CM

## 2019-12-23 DIAGNOSIS — Z5112 Encounter for antineoplastic immunotherapy: Secondary | ICD-10-CM | POA: Diagnosis not present

## 2019-12-23 LAB — BASIC METABOLIC PANEL
Anion gap: 12 (ref 5–15)
BUN: 14 mg/dL (ref 8–23)
CO2: 28 mmol/L (ref 22–32)
Calcium: 9.4 mg/dL (ref 8.9–10.3)
Chloride: 96 mmol/L — ABNORMAL LOW (ref 98–111)
Creatinine, Ser: 0.8 mg/dL (ref 0.44–1.00)
GFR calc Af Amer: >60 mL/min (ref >60)
GFR calc non Af Amer: >60 mL/min (ref >60)
Glucose, Bld: 93 mg/dL (ref 70–99)
Potassium: 2.9 mmol/L — ABNORMAL LOW (ref 3.5–5.1)
Sodium: 136 mmol/L (ref 135–145)

## 2019-12-24 ENCOUNTER — Telehealth (HOSPITAL_COMMUNITY): Payer: Self-pay | Admitting: Hematology

## 2019-12-24 NOTE — Telephone Encounter (Signed)
Per pts request. Itemized statements for her chemo tx were printed and emailed to her.

## 2019-12-29 ENCOUNTER — Ambulatory Visit (HOSPITAL_COMMUNITY): Payer: BC Managed Care – PPO

## 2019-12-29 ENCOUNTER — Other Ambulatory Visit: Payer: Self-pay

## 2019-12-29 ENCOUNTER — Other Ambulatory Visit (HOSPITAL_COMMUNITY): Payer: BC Managed Care – PPO

## 2019-12-29 ENCOUNTER — Inpatient Hospital Stay (HOSPITAL_COMMUNITY): Payer: BC Managed Care – PPO

## 2019-12-29 DIAGNOSIS — C9000 Multiple myeloma not having achieved remission: Secondary | ICD-10-CM

## 2019-12-29 DIAGNOSIS — Z5112 Encounter for antineoplastic immunotherapy: Secondary | ICD-10-CM | POA: Diagnosis not present

## 2019-12-29 LAB — CBC WITH DIFFERENTIAL/PLATELET
Abs Immature Granulocytes: 0 10*3/uL (ref 0.00–0.07)
Basophils Absolute: 0 10*3/uL (ref 0.0–0.1)
Basophils Relative: 1 %
Eosinophils Absolute: 0.1 10*3/uL (ref 0.0–0.5)
Eosinophils Relative: 3 %
HCT: 35.5 % — ABNORMAL LOW (ref 36.0–46.0)
Hemoglobin: 11.4 g/dL — ABNORMAL LOW (ref 12.0–15.0)
Immature Granulocytes: 0 %
Lymphocytes Relative: 23 %
Lymphs Abs: 0.8 10*3/uL (ref 0.7–4.0)
MCH: 30.6 pg (ref 26.0–34.0)
MCHC: 32.1 g/dL (ref 30.0–36.0)
MCV: 95.4 fL (ref 80.0–100.0)
Monocytes Absolute: 0.6 10*3/uL (ref 0.1–1.0)
Monocytes Relative: 19 %
Neutro Abs: 1.8 10*3/uL (ref 1.7–7.7)
Neutrophils Relative %: 54 %
Platelets: 283 10*3/uL (ref 150–400)
RBC: 3.72 MIL/uL — ABNORMAL LOW (ref 3.87–5.11)
RDW: 15.9 % — ABNORMAL HIGH (ref 11.5–15.5)
WBC: 3.2 10*3/uL — ABNORMAL LOW (ref 4.0–10.5)
nRBC: 0 % (ref 0.0–0.2)

## 2019-12-29 LAB — COMPREHENSIVE METABOLIC PANEL
ALT: 21 U/L (ref 0–44)
AST: 14 U/L — ABNORMAL LOW (ref 15–41)
Albumin: 3.6 g/dL (ref 3.5–5.0)
Alkaline Phosphatase: 129 U/L — ABNORMAL HIGH (ref 38–126)
Anion gap: 10 (ref 5–15)
BUN: 13 mg/dL (ref 8–23)
CO2: 27 mmol/L (ref 22–32)
Calcium: 8.7 mg/dL — ABNORMAL LOW (ref 8.9–10.3)
Chloride: 102 mmol/L (ref 98–111)
Creatinine, Ser: 0.71 mg/dL (ref 0.44–1.00)
GFR calc Af Amer: >60 mL/min (ref >60)
GFR calc non Af Amer: >60 mL/min (ref >60)
Glucose, Bld: 89 mg/dL (ref 70–99)
Potassium: 3.7 mmol/L (ref 3.5–5.1)
Sodium: 139 mmol/L (ref 135–145)
Total Bilirubin: 0.7 mg/dL (ref 0.3–1.2)
Total Protein: 6.3 g/dL — ABNORMAL LOW (ref 6.5–8.1)

## 2019-12-30 ENCOUNTER — Ambulatory Visit: Payer: BC Managed Care – PPO | Attending: Internal Medicine

## 2019-12-30 ENCOUNTER — Ambulatory Visit: Payer: BC Managed Care – PPO

## 2019-12-30 DIAGNOSIS — Z23 Encounter for immunization: Secondary | ICD-10-CM

## 2019-12-30 NOTE — Progress Notes (Signed)
   Covid-19 Vaccination Clinic  Name:  Stephanie Galvan    MRN: [id number] DOB: 1955-10-09  12/30/2019  [name] was observed post Covid-19 immunization for 15 minutes without incident. She was provided with Vaccine Information Sheet and instruction to access the V-Safe system.   [name] was instructed to call 911 with any severe reactions post vaccine: Marland Kitchen Difficulty breathing  . Swelling of face and throat  . A fast heartbeat  . A bad rash all over body  . Dizziness and weakness   Immunizations Administered    Name Date Dose VIS Date Route   Moderna COVID-19 Vaccine 12/30/2019  3:30 AM 0.5 mL 08/2019 Intramuscular   Manufacturer: Moderna   Lot: [id number]   [id number]

## 2020-01-01 ENCOUNTER — Encounter (HOSPITAL_COMMUNITY): Payer: Self-pay

## 2020-01-05 ENCOUNTER — Ambulatory Visit: Payer: BC Managed Care – PPO

## 2020-01-05 NOTE — Progress Notes (Signed)

## 2020-01-06 ENCOUNTER — Inpatient Hospital Stay (HOSPITAL_BASED_OUTPATIENT_CLINIC_OR_DEPARTMENT_OTHER): Payer: BC Managed Care – PPO | Admitting: Hematology

## 2020-01-06 ENCOUNTER — Other Ambulatory Visit: Payer: Self-pay

## 2020-01-06 ENCOUNTER — Encounter (HOSPITAL_COMMUNITY): Payer: Self-pay | Admitting: Hematology

## 2020-01-06 ENCOUNTER — Inpatient Hospital Stay (HOSPITAL_COMMUNITY): Payer: BC Managed Care – PPO

## 2020-01-06 DIAGNOSIS — C9000 Multiple myeloma not having achieved remission: Secondary | ICD-10-CM

## 2020-01-06 DIAGNOSIS — Z5112 Encounter for antineoplastic immunotherapy: Secondary | ICD-10-CM | POA: Diagnosis not present

## 2020-01-06 LAB — LACTATE DEHYDROGENASE: LDH: 145 U/L (ref 98–192)

## 2020-01-06 LAB — COMPREHENSIVE METABOLIC PANEL
ALT: 22 U/L (ref 0–44)
AST: 25 U/L (ref 15–41)
Albumin: 3.7 g/dL (ref 3.5–5.0)
Alkaline Phosphatase: 147 U/L — ABNORMAL HIGH (ref 38–126)
Anion gap: 11 (ref 5–15)
BUN: 12 mg/dL (ref 8–23)
CO2: 28 mmol/L (ref 22–32)
Calcium: 9.2 mg/dL (ref 8.9–10.3)
Chloride: 100 mmol/L (ref 98–111)
Creatinine, Ser: 0.74 mg/dL (ref 0.44–1.00)
GFR calc Af Amer: >60 mL/min (ref >60)
GFR calc non Af Amer: >60 mL/min (ref >60)
Glucose, Bld: 104 mg/dL — ABNORMAL HIGH (ref 70–99)
Potassium: 3.5 mmol/L (ref 3.5–5.1)
Sodium: 139 mmol/L (ref 135–145)
Total Bilirubin: 0.8 mg/dL (ref 0.3–1.2)
Total Protein: 6.7 g/dL (ref 6.5–8.1)

## 2020-01-06 LAB — CBC WITH DIFFERENTIAL/PLATELET
Abs Immature Granulocytes: 0.01 10*3/uL (ref 0.00–0.07)
Basophils Absolute: 0 10*3/uL (ref 0.0–0.1)
Basophils Relative: 0 %
Eosinophils Absolute: 0.2 10*3/uL (ref 0.0–0.5)
Eosinophils Relative: 7 %
HCT: 35.9 % — ABNORMAL LOW (ref 36.0–46.0)
Hemoglobin: 11.5 g/dL — ABNORMAL LOW (ref 12.0–15.0)
Immature Granulocytes: 0 %
Lymphocytes Relative: 22 %
Lymphs Abs: 0.6 10*3/uL — ABNORMAL LOW (ref 0.7–4.0)
MCH: 30.7 pg (ref 26.0–34.0)
MCHC: 32 g/dL (ref 30.0–36.0)
MCV: 95.7 fL (ref 80.0–100.0)
Monocytes Absolute: 0.2 10*3/uL (ref 0.1–1.0)
Monocytes Relative: 6 %
Neutro Abs: 1.8 10*3/uL (ref 1.7–7.7)
Neutrophils Relative %: 65 %
Platelets: 212 10*3/uL (ref 150–400)
RBC: 3.75 MIL/uL — ABNORMAL LOW (ref 3.87–5.11)
RDW: 15.8 % — ABNORMAL HIGH (ref 11.5–15.5)
WBC: 2.9 10*3/uL — ABNORMAL LOW (ref 4.0–10.5)
nRBC: 0 % (ref 0.0–0.2)

## 2020-01-06 MED ORDER — ZOLEDRONIC ACID 4 MG/100ML IV SOLN
INTRAVENOUS | Status: AC
  Filled 2020-01-06: qty 100

## 2020-01-06 MED ORDER — SODIUM CHLORIDE 0.9 % IV SOLN: Freq: Once | INTRAVENOUS | Status: AC

## 2020-01-06 MED ORDER — ZOLEDRONIC ACID 4 MG/100ML IV SOLN
4.0000 mg | Freq: Once | INTRAVENOUS | Status: AC
  Administered 2020-01-06: 13:00:00 4 mg via INTRAVENOUS

## 2020-01-06 NOTE — Progress Notes (Signed)
 [facility] [address] [place], [place] [address]   CLINIC:  Medical Oncology/Hematology  PCP:  Knowlton, Steve, MD [address] [place]  [address] [phone]   REASON FOR VISIT:  Multiple myeloma.  CURRENT THERAPY: RVD    INTERVAL HISTORY:  Stephanie Galvan 64 y.o. female seen for follow-up and toxicity assessment prior to her myeloma treatments.  She is taking Revlimid.  She stopped taking dexamethasone.  She was evaluated at [facility].  She is taking hydrocodone 1 tablet daily.  Reports appetite and energy levels of 75%.  Low back pain is reported as 5 out of 10.  Numbness in the feet has been stable.  She reports complete resolution of abdominal symptoms after Velcade was stopped.  REVIEW OF SYSTEMS:  Review of Systems  Constitutional: Positive for fatigue.  Neurological: Positive for numbness.  All other systems reviewed and are negative.    PAST MEDICAL/SURGICAL HISTORY:  Past Medical History:  Diagnosis Date  . Breast cancer (HCC) 2006   left breast  . Breast mass, right   . Compression fracture of cervical spine (HCC) 06/18/2019   recent Chest xray stating had fx.  . High cholesterol   . History of bladder infections   . Hypertension   . Rash Aug 2013   Past Surgical History:  Procedure Laterality Date  . ABDOMINAL HYSTERECTOMY     just uterus removed  . BREAST BIOPSY    . BREAST LUMPECTOMY WITH RADIOACTIVE SEED LOCALIZATION Right 07/24/2018   Procedure: RIGHT BREAST LUMPECTOMY WITH RADIOACTIVE SEED LOCALIZATION;  Surgeon: Blackman, Douglas, MD;  Location: [facility];  Service: General;  Laterality: Right;  . MASTECTOMY  5/06   left  . PARTIAL HYSTERECTOMY    . PORT-A-CATH REMOVAL    . PORTACATH PLACEMENT       SOCIAL HISTORY:  Social History   Socioeconomic History  . Marital status: Married    Spouse name: Not on file  . Number of children: 2  . Years of education: Not on file  . Highest education level: Not  on file  Occupational History    Employer: [employer]  Tobacco Use  . Smoking status: Never Smoker  . Smokeless tobacco: Never Used  Substance and Sexual Activity  . Alcohol use: No  . Drug use: No  . Sexual activity: Yes    Birth control/protection: Surgical  Other Topics Concern  . Not on file  Social History Narrative  . Not on file   Social Determinants of Health   Financial Resource Strain:   . Difficulty of Paying Living Expenses:   Food Insecurity:   . Worried About Running Out of Food in the Last Year:   . Ran Out of Food in the Last Year:   Transportation Needs:   . Lack of Transportation (Medical):   . Lack of Transportation (Non-Medical):   Physical Activity:   . Days of Exercise per Week:   . Minutes of Exercise per Session:   Stress:   . Feeling of Stress :   Social Connections:   . Frequency of Communication with Friends and Family:   . Frequency of Social Gatherings with Friends and Family:   . Attends Religious Services:   . Active Member of Clubs or Organizations:   . Attends Club or Organization Meetings:   . Marital Status:   Intimate Partner Violence:   . Fear of Current or Ex-Partner:   . Emotionally Abused:   . Physically Abused:   .   Sexually Abused:     FAMILY HISTORY:  Family History  Problem Relation Age of Onset  . Dementia Mother   . Stomach cancer Father   . Cancer Brother   . Cancer Brother   . Diabetes Brother   . Colitis Daughter   . Hypertension Daughter   . Hypertension Daughter     CURRENT MEDICATIONS:  Outpatient Encounter Medications as of 01/06/2020  Medication Sig  . acyclovir (ZOVIRAX) 400 MG tablet TAKE 1 TABLET BY MOUTH TWICE A DAY  . aspirin EC 81 MG tablet Take 1 tablet (81 mg total) by mouth daily.  . atenolol (TENORMIN) 50 MG tablet Take 50 mg by mouth daily.    . bortezomib IV (VELCADE) 3.5 MG injection Inject 2.75 mg into the vein once a week. Days 1, 8, 15 every 21 days   . calcium carbonate (TUMS -  DOSED IN MG ELEMENTAL CALCIUM) 500 MG chewable tablet Chew 2 tablets by mouth 2 (two) times daily.  . dexamethasone (DECADRON) 4 MG tablet Take 10 tablets (40 mg) on days 1, 8, and 15 of chemo. Repeat every 21 days.  . ergocalciferol (VITAMIN D2) 1.25 MG (50000 UT) capsule Take 1 capsule (50,000 Units total) by mouth once a week.  . hydrochlorothiazide 25 MG tablet Take 25 mg by mouth daily.    . lenalidomide (REVLIMID) 25 MG capsule Take one capsule daily on days 1-14 every 21 days.  . potassium chloride (KLOR-CON) 10 MEQ tablet Take 2 tablets (20 mEq total) by mouth 3 (three) times daily.  . Aspirin-Salicylamide-Caffeine (BC HEADACHE POWDER PO) Take 1 tablet by mouth as needed.   . furosemide (LASIX) 20 MG tablet TAKE 1 TABLET BY MOUTH EVERY DAY AS NEEDED (Patient not taking: Reported on 12/15/2019)  . HYDROcodone-acetaminophen (NORCO) 10-325 MG tablet Take 1 tablet by mouth every 8 (eight) hours as needed. (Patient not taking: Reported on 12/15/2019)  . prochlorperazine (COMPAZINE) 10 MG tablet Take 1 tablet (10 mg total) by mouth every 6 (six) hours as needed for nausea or vomiting. (Patient not taking: Reported on 12/15/2019)   No facility-administered encounter medications on file as of 01/06/2020.    ALLERGIES:  No Known Allergies   PHYSICAL EXAM:  ECOG Performance status: 1  Vitals:   01/06/20 1207  BP: 138/79  Pulse: (!) 58  Resp: 18  Temp: (!) 96.9 F (36.1 C)  SpO2: 100%   Filed Weights   01/06/20 1207  Weight: 162 lb (73.5 kg)    Physical Exam Vitals reviewed.  Constitutional:      Appearance: Normal appearance.  Cardiovascular:     Rate and Rhythm: Normal rate and regular rhythm.     Heart sounds: Normal heart sounds.  Pulmonary:     Effort: Pulmonary effort is normal.     Breath sounds: Normal breath sounds.  Abdominal:     General: There is no distension.     Palpations: Abdomen is soft. There is no mass.  Skin:    General: Skin is warm.  Neurological:      General: No focal deficit present.     Mental Status: She is alert and oriented to person, place, and time.  Psychiatric:        Mood and Affect: Mood normal.        Behavior: Behavior normal.      LABORATORY DATA:  I have reviewed the labs as listed.  CBC    Component Value Date/Time   WBC 2.9 (L) 01/06/2020 1113     RBC 3.75 (L) 01/06/2020 1113   HGB 11.5 (L) 01/06/2020 1113   HCT 35.9 (L) 01/06/2020 1113   PLT 212 01/06/2020 1113   MCV 95.7 01/06/2020 1113   MCH 30.7 01/06/2020 1113   MCHC 32.0 01/06/2020 1113   RDW 15.8 (H) 01/06/2020 1113   LYMPHSABS 0.6 (L) 01/06/2020 1113   MONOABS 0.2 01/06/2020 1113   EOSABS 0.2 01/06/2020 1113   BASOSABS 0.0 01/06/2020 1113   CMP Latest Ref Rng & Units 01/06/2020 12/29/2019 12/23/2019  Glucose 70 - 99 mg/dL 104(H) 89 93  BUN 8 - 23 mg/dL 12 13 14  Creatinine 0.44 - 1.00 mg/dL 0.74 0.71 0.80  Sodium 135 - 145 mmol/L 139 139 136  Potassium 3.5 - 5.1 mmol/L 3.5 3.7 2.9(L)  Chloride 98 - 111 mmol/L 100 102 96(L)  CO2 22 - 32 mmol/L 28 27 28  Calcium 8.9 - 10.3 mg/dL 9.2 8.7(L) 9.4  Total Protein 6.5 - 8.1 g/dL 6.7 6.3(L) -  Total Bilirubin 0.3 - 1.2 mg/dL 0.8 0.7 -  Alkaline Phos 38 - 126 U/L 147(H) 129(H) -  AST 15 - 41 U/L 25 14(L) -  ALT 0 - 44 U/L 22 21 -       DIAGNOSTIC IMAGING:  I reviewed scans.     ASSESSMENT & PLAN:   Multiple myeloma (HCC) 1.  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  Revlimid started on 10/06/2019. -As she was having gastric symptoms, she received her last dose of Velcade on 12/15/2019. -She reports complete improvement in her gastric symptoms after Velcade was held. -She went to [employer].  I reviewed labs and records from [facility].  Labs from 12/22/2019 shows M spike is absent.  Immunofixation is normal.  Free light chains are also normal. -She was recommended immediate bone marrow transplant.  Patient is considering if she  gets financial support.  They are waiting approval from her insurance. -She was told to continue Revlimid and dexamethasone at this time.  We will cut back on dexamethasone to 20 mg weekly. -If she does not get immediate transplant, stem cells will be collected and will be placed on Revlimid, Ninlaro and dexamethasone.  I will see her back in 3 weeks for follow-up.  Myeloma panel from today is pending.  2.  Back pain: -She had compression fractures of T8 and T10 and T12.  Also L1 and L3 compression fractures. -She is requiring hydrocodone 1 tablet/day.  3.  Hypokalemia: -She is taking potassium 20 mEq twice daily.  Potassium is normal today.  4.  Bone protection: -She received Zometa 4 mg on 12/01/2019.  She will continue monthly Zometa.  5.  ID prophylaxis: -She will continue acyclovir twice daily until she finishes the bottle.  Aspirin 81 mg for thromboprophylaxis.   Orders placed this encounter:  No orders of the defined types were placed in this encounter.     Sreedhar Katragadda, MD [facility] [phone]  

## 2020-01-06 NOTE — Patient Instructions (Signed)
[place] at [facility] Discharge Instructions  You were seen today by Dr. Delton Coombes. He went over your recent lab results. Be sure to take five tablets of the dexamethasone once a week. He will see you back in 3 weeks for labs and follow up.   Thank you for choosing [place] at [facility] to provide your oncology and hematology care.  To afford each patient quality time with our provider, please arrive at least 15 minutes before your scheduled appointment time.   If you have a lab appointment with the [facility] please come in thru the  Main Entrance and check in at the main information desk  You need to re-schedule your appointment should you arrive 10 or more minutes late.  We strive to give you quality time with our providers, and arriving late affects you and other patients whose appointments are after yours.  Also, if you no show three or more times for appointments you may be dismissed from the clinic at the providers discretion.     Again, thank you for choosing [facility].  Our hope is that these requests will decrease the amount of time that you wait before being seen by our physicians.       _____________________________________________________________  Should you have questions after your visit to [facility], please contact our office at [phone] between the hours of 8:00 a.m. and 4:30 p.m.  Voicemails left after 4:00 p.m. will not be returned until the following business day.  For prescription refill requests, have your pharmacy contact our office and allow 72 hours.    Cancer Center Support Programs:   > Cancer Support Group  2nd Tuesday of the month 1pm-2pm, Journey Room

## 2020-01-06 NOTE — Assessment & Plan Note (Signed)
1.  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  Revlimid started on 10/06/2019. -As she was having gastric symptoms, she received her last dose of Velcade on 12/15/2019. -She reports complete improvement in her gastric symptoms after Velcade was held. -She went to [facility].  I reviewed labs and records from [facility].  Labs from 12/22/2019 shows M spike is absent.  Immunofixation is normal.  Free light chains are also normal. -She was recommended immediate bone marrow transplant.  Patient is considering if she gets financial support.  They are waiting approval from her insurance. -She was told to continue Revlimid and dexamethasone at this time.  We will cut back on dexamethasone to 20 mg weekly. -If she does not get immediate transplant, stem cells will be collected and will be placed on Revlimid, Ninlaro and dexamethasone.  I will see her back in 3 weeks for follow-up.  Myeloma panel from today is pending.  2.  Back pain: -She had compression fractures of T8 and T10 and T12.  Also L1 and L3 compression fractures. -She is requiring hydrocodone 1 tablet/day.  3.  Hypokalemia: -She is taking potassium 20 mEq twice daily.  Potassium is normal today.  4.  Bone protection: -She received Zometa 4 mg on 12/01/2019.  She will continue monthly Zometa.  5.  ID prophylaxis: -She will continue acyclovir twice daily until she finishes the bottle.  Aspirin 81 mg for thromboprophylaxis.

## 2020-01-06 NOTE — Progress Notes (Signed)
Message received that patient is good for Zometa 4mg  today.   Called and spoke with [facility] LPN/ Dr. Delton Coombes. NO VELCADE today. Administer Zometa only per [facility] LPN.  Labs within parameters for treatment.   Zometa 4mg  given today per MD orders. Tolerated infusion without adverse affects. Vital signs stable. No complaints at this time. Discharged from clinic ambulatory. F/U with [facility] as scheduled.

## 2020-01-06 NOTE — Patient Instructions (Signed)
[facility] Discharge Instructions for Patients Receiving Chemotherapy  Today you received the following chemotherapy agents   To help prevent nausea and vomiting after your treatment, we encourage you to take your nausea medication   If you develop nausea and vomiting that is not controlled by your nausea medication, call the clinic.   BELOW ARE SYMPTOMS THAT SHOULD BE REPORTED IMMEDIATELY:  *FEVER GREATER THAN 100.5 F  *CHILLS WITH OR WITHOUT FEVER  NAUSEA AND VOMITING THAT IS NOT CONTROLLED WITH YOUR NAUSEA MEDICATION  *UNUSUAL SHORTNESS OF BREATH  *UNUSUAL BRUISING OR BLEEDING  TENDERNESS IN MOUTH AND THROAT WITH OR WITHOUT PRESENCE OF ULCERS  *URINARY PROBLEMS  *BOWEL PROBLEMS  UNUSUAL RASH Items with * indicate a potential emergency and should be followed up as soon as possible.  Feel free to call the clinic should you have any questions or concerns. The clinic phone number is [phone].  Please show the CHEMO ALERT CARD at check-in to the Emergency Department and triage nurse.   

## 2020-01-07 ENCOUNTER — Other Ambulatory Visit (HOSPITAL_COMMUNITY): Payer: Self-pay | Admitting: *Deleted

## 2020-01-07 DIAGNOSIS — D472 Monoclonal gammopathy: Secondary | ICD-10-CM

## 2020-01-07 LAB — PROTEIN ELECTROPHORESIS, SERUM
A/G Ratio: 1.2 (ref 0.7–1.7)
Albumin ELP: 3.3 g/dL (ref 2.9–4.4)
Alpha-1-Globulin: 0.3 g/dL (ref 0.0–0.4)
Alpha-2-Globulin: 0.9 g/dL (ref 0.4–1.0)
Beta Globulin: 1 g/dL (ref 0.7–1.3)
Gamma Globulin: 0.6 g/dL (ref 0.4–1.8)
Globulin, Total: 2.8 g/dL (ref 2.2–3.9)
Total Protein ELP: 6.1 g/dL (ref 6.0–8.5)

## 2020-01-07 LAB — KAPPA/LAMBDA LIGHT CHAINS
Kappa free light chain: 24.1 mg/L — ABNORMAL HIGH (ref 3.3–19.4)
Kappa, lambda light chain ratio: 1.21 (ref 0.26–1.65)
Lambda free light chains: 19.9 mg/L (ref 5.7–26.3)

## 2020-01-07 MED ORDER — LENALIDOMIDE 25 MG PO CAPS: ORAL_CAPSULE | ORAL | 0 refills | Status: DC

## 2020-01-11 ENCOUNTER — Other Ambulatory Visit (HOSPITAL_COMMUNITY): Payer: Self-pay | Admitting: *Deleted

## 2020-01-11 DIAGNOSIS — D472 Monoclonal gammopathy: Secondary | ICD-10-CM

## 2020-01-11 MED ORDER — LENALIDOMIDE 25 MG PO CAPS: ORAL_CAPSULE | ORAL | 0 refills | Status: DC

## 2020-01-11 NOTE — Telephone Encounter (Signed)
Okay per Dr. Delton Coombes to refill Revlimid.

## 2020-01-20 ENCOUNTER — Encounter (HOSPITAL_COMMUNITY): Payer: Self-pay

## 2020-01-21 ENCOUNTER — Other Ambulatory Visit: Payer: Self-pay

## 2020-01-21 ENCOUNTER — Inpatient Hospital Stay (HOSPITAL_COMMUNITY): Payer: BC Managed Care – PPO | Attending: Hematology

## 2020-01-21 DIAGNOSIS — Z833 Family history of diabetes mellitus: Secondary | ICD-10-CM | POA: Diagnosis not present

## 2020-01-21 DIAGNOSIS — Z8249 Family history of ischemic heart disease and other diseases of the circulatory system: Secondary | ICD-10-CM | POA: Diagnosis not present

## 2020-01-21 DIAGNOSIS — K5900 Constipation, unspecified: Secondary | ICD-10-CM | POA: Diagnosis not present

## 2020-01-21 DIAGNOSIS — E876 Hypokalemia: Secondary | ICD-10-CM | POA: Diagnosis not present

## 2020-01-21 DIAGNOSIS — Z853 Personal history of malignant neoplasm of breast: Secondary | ICD-10-CM | POA: Diagnosis not present

## 2020-01-21 DIAGNOSIS — R630 Anorexia: Secondary | ICD-10-CM | POA: Diagnosis not present

## 2020-01-21 DIAGNOSIS — Z818 Family history of other mental and behavioral disorders: Secondary | ICD-10-CM | POA: Insufficient documentation

## 2020-01-21 DIAGNOSIS — M549 Dorsalgia, unspecified: Secondary | ICD-10-CM | POA: Insufficient documentation

## 2020-01-21 DIAGNOSIS — C9000 Multiple myeloma not having achieved remission: Secondary | ICD-10-CM | POA: Diagnosis not present

## 2020-01-21 DIAGNOSIS — Z79899 Other long term (current) drug therapy: Secondary | ICD-10-CM | POA: Diagnosis not present

## 2020-01-21 DIAGNOSIS — Z800 Family history of malignant neoplasm of digestive organs: Secondary | ICD-10-CM | POA: Diagnosis not present

## 2020-01-21 DIAGNOSIS — Z809 Family history of malignant neoplasm, unspecified: Secondary | ICD-10-CM | POA: Diagnosis not present

## 2020-01-21 LAB — CBC WITH DIFFERENTIAL/PLATELET
Abs Immature Granulocytes: 0.01 10*3/uL (ref 0.00–0.07)
Basophils Absolute: 0.1 10*3/uL (ref 0.0–0.1)
Basophils Relative: 2 %
Eosinophils Absolute: 0.1 10*3/uL (ref 0.0–0.5)
Eosinophils Relative: 4 %
HCT: 38.3 % (ref 36.0–46.0)
Hemoglobin: 12.6 g/dL (ref 12.0–15.0)
Immature Granulocytes: 0 %
Lymphocytes Relative: 43 %
Lymphs Abs: 1.8 10*3/uL (ref 0.7–4.0)
MCH: 32.1 pg (ref 26.0–34.0)
MCHC: 32.9 g/dL (ref 30.0–36.0)
MCV: 97.7 fL (ref 80.0–100.0)
Monocytes Absolute: 0.7 10*3/uL (ref 0.1–1.0)
Monocytes Relative: 16 %
Neutro Abs: 1.4 10*3/uL — ABNORMAL LOW (ref 1.7–7.7)
Neutrophils Relative %: 35 %
Platelets: 239 10*3/uL (ref 150–400)
RBC: 3.92 MIL/uL (ref 3.87–5.11)
RDW: 16.1 % — ABNORMAL HIGH (ref 11.5–15.5)
WBC: 4 10*3/uL (ref 4.0–10.5)
nRBC: 0 % (ref 0.0–0.2)

## 2020-01-21 LAB — COMPREHENSIVE METABOLIC PANEL
ALT: 16 U/L (ref 0–44)
AST: 17 U/L (ref 15–41)
Albumin: 3.8 g/dL (ref 3.5–5.0)
Alkaline Phosphatase: 108 U/L (ref 38–126)
Anion gap: 10 (ref 5–15)
BUN: 14 mg/dL (ref 8–23)
CO2: 27 mmol/L (ref 22–32)
Calcium: 8.9 mg/dL (ref 8.9–10.3)
Chloride: 101 mmol/L (ref 98–111)
Creatinine, Ser: 0.77 mg/dL (ref 0.44–1.00)
GFR calc Af Amer: >60 mL/min (ref >60)
GFR calc non Af Amer: >60 mL/min (ref >60)
Glucose, Bld: 101 mg/dL — ABNORMAL HIGH (ref 70–99)
Potassium: 3 mmol/L — ABNORMAL LOW (ref 3.5–5.1)
Sodium: 138 mmol/L (ref 135–145)
Total Bilirubin: 0.8 mg/dL (ref 0.3–1.2)
Total Protein: 6.7 g/dL (ref 6.5–8.1)

## 2020-01-28 ENCOUNTER — Other Ambulatory Visit: Payer: Self-pay

## 2020-01-28 ENCOUNTER — Encounter (HOSPITAL_COMMUNITY): Payer: Self-pay | Admitting: Hematology

## 2020-01-28 ENCOUNTER — Other Ambulatory Visit (HOSPITAL_COMMUNITY): Payer: Self-pay | Admitting: Surgery

## 2020-01-28 ENCOUNTER — Inpatient Hospital Stay (HOSPITAL_BASED_OUTPATIENT_CLINIC_OR_DEPARTMENT_OTHER): Payer: BC Managed Care – PPO | Admitting: Hematology

## 2020-01-28 DIAGNOSIS — C9000 Multiple myeloma not having achieved remission: Secondary | ICD-10-CM | POA: Diagnosis not present

## 2020-01-28 MED ORDER — GABAPENTIN 100 MG PO CAPS
300.0000 mg | ORAL_CAPSULE | Freq: Every day | ORAL | 1 refills | Status: DC
Start: 2020-01-28 — End: 2020-05-23

## 2020-01-28 MED ORDER — HYDROCODONE-ACETAMINOPHEN 10-325 MG PO TABS: 1.0000 | ORAL_TABLET | Freq: Three times a day (TID) | ORAL | 0 refills | Status: DC | PRN

## 2020-01-28 NOTE — Progress Notes (Signed)
[place], [place] [address]   CLINIC:  Medical Oncology/Hematology  PCP:  [name], MD [place] / [place] [address]  [phone]  REASON FOR VISIT:  Follow-up for Multiple myeloma  CURRENT THERAPY: Revlimid  INTERVAL HISTORY:  [name] 64 y.o. female returns for routine follow-up for her multiple myeloma.Clarisa Fling was last seen on 01/06/2020.  She notes that she has been feeling burning and stabbing in her hands and feet, particularly at night. She is willing to try gabapentin to treat. She notes that her GI symptoms have resolved and she is no longer belching. She does continue to have pressure in her back, however. She has been utilizing a back brace.   REVIEW OF SYSTEMS:  Review of Systems  Constitutional: Positive for appetite change (mildly decreased) and fatigue (mild). Negative for chills, diaphoresis and fever.  HENT:   Negative for mouth sores, sore throat and trouble swallowing.   Eyes: Negative for eye problems.  Respiratory: Negative for cough, shortness of breath and wheezing.   Cardiovascular: Negative for chest pain, leg swelling and palpitations.  Gastrointestinal: Positive for constipation. Negative for abdominal pain, diarrhea, nausea and vomiting.  Genitourinary: Negative for bladder incontinence, dysuria and frequency.   Musculoskeletal: Negative for arthralgias, back pain and myalgias.  Skin: Negative for rash.  Neurological: Negative for dizziness, extremity weakness, headaches and numbness.  Hematological: Bruises/bleeds easily.  Psychiatric/Behavioral: Negative for depression and sleep disturbance. The patient is not nervous/anxious.     PAST MEDICAL/SURGICAL HISTORY:  Past Medical History:  Diagnosis Date  . Breast cancer ([place]) 2006   left breast  . Breast mass, right   . Compression fracture of cervical spine ([facility]) 06/18/2019   recent Chest xray stating had fx.  . High cholesterol   .  History of bladder infections   . Hypertension   . Rash Aug 2013   Past Surgical History:  Procedure Laterality Date  . ABDOMINAL HYSTERECTOMY     just uterus removed  . BREAST BIOPSY    . BREAST LUMPECTOMY WITH RADIOACTIVE SEED LOCALIZATION Right 07/24/2018   Procedure: RIGHT BREAST LUMPECTOMY WITH RADIOACTIVE SEED LOCALIZATION;  Surgeon: Coralie Keens, MD;  Location: [facility];  Service: General;  Laterality: Right;  . MASTECTOMY  5/06   left  . PARTIAL HYSTERECTOMY    . PORT-A-CATH REMOVAL    . PORTACATH PLACEMENT      SOCIAL HISTORY:  Social History   Socioeconomic History  . Marital status: Married    Spouse name: Not on file  . Number of children: 2  . Years of education: Not on file  . Highest education level: Not on file  Occupational History    Employer: [employer]  Tobacco Use  . Smoking status: Never Smoker  . Smokeless tobacco: Never Used  Substance and Sexual Activity  . Alcohol use: No  . Drug use: No  . Sexual activity: Yes    Birth control/protection: Surgical  Other Topics Concern  . Not on file  Social History Narrative  . Not on file   Social Determinants of Health   Financial Resource Strain:   . Difficulty of Paying Living Expenses:   Food Insecurity:   . Worried About Charity fundraiser in the Last Year:   . Arboriculturist in the Last Year:   Transportation Needs:   . Film/video editor (Medical):   Marland Kitchen Lack of Transportation (Non-Medical):   Physical  Activity:   . Days of Exercise per Week:   . Minutes of Exercise per Session:   Stress:   . Feeling of Stress :   Social Connections:   . Frequency of Communication with Friends and Family:   . Frequency of Social Gatherings with Friends and Family:   . Attends Religious Services:   . Active Member of Clubs or Organizations:   . Attends Archivist Meetings:   Marland Kitchen Marital Status:   Intimate Partner Violence:   . Fear of Current or Ex-Partner:   .  Emotionally Abused:   Marland Kitchen Physically Abused:   . Sexually Abused:     FAMILY HISTORY:  Family History  Problem Relation Age of Onset  . Dementia Mother   . Stomach cancer Father   . Cancer Brother   . Cancer Brother   . Diabetes Brother   . Colitis Daughter   . Hypertension Daughter   . Hypertension Daughter     CURRENT MEDICATIONS:  Current Outpatient Medications  Medication Sig Dispense Refill  . acyclovir (ZOVIRAX) 400 MG tablet TAKE 1 TABLET BY MOUTH TWICE A DAY 180 tablet 1  . aspirin EC 81 MG tablet Take 1 tablet (81 mg total) by mouth daily.    . Aspirin-Salicylamide-Caffeine (BC HEADACHE POWDER PO) Take 1 tablet by mouth as needed.     Marland Kitchen atenolol (TENORMIN) 50 MG tablet Take 50 mg by mouth daily.      . calcium carbonate (TUMS - DOSED IN MG ELEMENTAL CALCIUM) 500 MG chewable tablet Chew 2 tablets by mouth 2 (two) times daily.    Marland Kitchen dexamethasone (DECADRON) 4 MG tablet Take 10 tablets (40 mg) on days 1, 8, and 15 of chemo. Repeat every 21 days. 30 tablet 3  . ergocalciferol (VITAMIN D2) 1.25 MG (50000 UT) capsule Take 1 capsule (50,000 Units total) by mouth once a week. 12 capsule 1  . furosemide (LASIX) 20 MG tablet TAKE 1 TABLET BY MOUTH EVERY DAY AS NEEDED (Patient not taking: Reported on 12/15/2019) 90 tablet 1  . gabapentin (NEURONTIN) 100 MG capsule Take 3 capsules (300 mg total) by mouth at bedtime. 60 capsule 1  . hydrochlorothiazide 25 MG tablet Take 25 mg by mouth daily.      Marland Kitchen HYDROcodone-acetaminophen (NORCO) 10-325 MG tablet Take 1 tablet by mouth every 8 (eight) hours as needed. 30 tablet 0  . lenalidomide (REVLIMID) 25 MG capsule Take one capsule daily on days 1-14 every 21 days. 14 capsule 0  . potassium chloride (KLOR-CON) 10 MEQ tablet Take 2 tablets (20 mEq total) by mouth 3 (three) times daily. 180 tablet 1  . prochlorperazine (COMPAZINE) 10 MG tablet Take 1 tablet (10 mg total) by mouth every 6 (six) hours as needed for nausea or vomiting. (Patient not taking:  Reported on 12/15/2019) 45 tablet 2   No current facility-administered medications for this visit.    ALLERGIES:  No Known Allergies  PHYSICAL EXAM:  Performance status (ECOG): 1 - Symptomatic but completely ambulatory  Vitals:   01/28/20 1111  BP: 130/69  Pulse: (!) 58  Resp: 16  Temp: 97.8 F (36.6 C)  SpO2: 100%   Wt Readings from Last 3 Encounters:  01/28/20 160 lb 3.2 oz (72.7 kg)  01/06/20 162 lb (73.5 kg)  12/15/19 161 lb 11.2 oz (73.3 kg)   Physical Exam Constitutional:      General: She is not in acute distress.    Appearance: Normal appearance. She is normal weight. She is  not ill-appearing.  HENT:     Mouth/Throat:     Mouth: Mucous membranes are moist.     Pharynx: No oropharyngeal exudate or posterior oropharyngeal erythema.  Eyes:     Extraocular Movements: Extraocular movements intact.     Pupils: Pupils are equal, round, and reactive to light.  Cardiovascular:     Rate and Rhythm: Normal rate and regular rhythm.     Pulses: Normal pulses.     Heart sounds: Normal heart sounds. No murmur. No friction rub. No gallop.   Pulmonary:     Effort: Pulmonary effort is normal.     Breath sounds: Normal breath sounds. No wheezing, rhonchi or rales.  Abdominal:     Palpations: There is no mass.     Tenderness: There is no abdominal tenderness. There is no guarding.  Musculoskeletal:        General: No swelling or tenderness.     Right lower leg: No edema.     Left lower leg: No edema.  Skin:    Findings: No bruising or erythema.  Neurological:     Mental Status: She is alert and oriented to person, place, and time.     Sensory: No sensory deficit.  Psychiatric:        Mood and Affect: Mood normal.        Behavior: Behavior normal.        Thought Content: Thought content normal.        Judgment: Judgment normal.     LABORATORY DATA:  I have reviewed the labs as listed.  CBC Latest Ref Rng & Units 01/21/2020 01/06/2020 12/29/2019  WBC 4.0 - 10.5 K/uL 4.0  2.9(L) 3.2(L)  Hemoglobin 12.0 - 15.0 g/dL 12.6 11.5(L) 11.4(L)  Hematocrit 36.0 - 46.0 % 38.3 35.9(L) 35.5(L)  Platelets 150 - 400 K/uL 239 212 283   CMP Latest Ref Rng & Units 01/21/2020 01/06/2020 12/29/2019  Glucose 70 - 99 mg/dL 101(H) 104(H) 89  BUN 8 - 23 mg/dL 14 12 13   Creatinine 0.44 - 1.00 mg/dL 0.77 0.74 0.71  Sodium 135 - 145 mmol/L 138 139 139  Potassium 3.5 - 5.1 mmol/L 3.0(L) 3.5 3.7  Chloride 98 - 111 mmol/L 101 100 102  CO2 22 - 32 mmol/L 27 28 27   Calcium 8.9 - 10.3 mg/dL 8.9 9.2 8.7(L)  Total Protein 6.5 - 8.1 g/dL 6.7 6.7 6.3(L)  Total Bilirubin 0.3 - 1.2 mg/dL 0.8 0.8 0.7  Alkaline Phos 38 - 126 U/L 108 147(H) 129(H)  AST 15 - 41 U/L 17 25 14(L)  ALT 0 - 44 U/L 16 22 21        Component Value Date/Time   RBC 3.92 01/21/2020 0955   MCV 97.7 01/21/2020 0955   MCH 32.1 01/21/2020 0955   MCHC 32.9 01/21/2020 0955   RDW 16.1 (H) 01/21/2020 0955   LYMPHSABS 1.8 01/21/2020 0955   MONOABS 0.7 01/21/2020 0955   EOSABS 0.1 01/21/2020 0955   BASOSABS 0.1 01/21/2020 0955    DIAGNOSTIC IMAGING:  I have independently reviewed the scans and discussed with the patient.  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  Revlimid started on 10/06/2019 due to delay. -Velcade held since 12/15/2019 due to gastric symptoms, which have resolved completely. -She was evaluated by Dr. Samule Ohm at [facility].  Recommended to have immediate bone marrow transplant. -She has compression fractures of T8, T10 and  T12.  Also L1 and L3 compression fractures.  Plan: 1.  IgA lambda plasma cell myeloma: -She is continuing Revlimid and dexamethasone without any problems.  We have cut back dexamethasone to 20 mg weekly.  Denies any GI symptoms. -I reviewed myeloma panel from 12/29/2019 which did not show M spike.  Free light chain ratio is 1.21 with free kappa light chains of 24 and  lambda light chains 19.9. -I have reviewed routine labs which are grossly within normal limits. -I have told her to reach out to the transplant team at [facility].  I will see her back in 4 weeks for follow-up.  2.  Back pain: -She is wearing a brace today.  She takes hydrocodone 1 tablet as needed.  3.  Bone protection: -She was started on Zometa on 12/01/2019.  She is tolerating it very well.  4.  Hypokalemia: -Continue potassium 20 mg twice daily.  Potassium is normal today.  5.  ID prophylaxis: -Continue acyclovir twice daily.  Continue aspirin 81 mg for thromboprophylaxis.    Orders placed this encounter:  No orders of the defined types were placed in this encounter.         Derek Jack, MD, 01/29/20 2:56 PM  [place] [phone]   I, Jacqualyn Posey, am acting as a scribe for Dr. Sanda Linger.  I, Derek Jack MD, have reviewed the above documentation for accuracy and completeness, and I agree with the above.

## 2020-01-28 NOTE — Patient Instructions (Addendum)
[place] at [facility] Discharge Instructions  You were seen today by Dr. Delton Coombes. He went over your recent results. Your Myeloma labs from April looked good. He will see you back in 1 month for labs and follow up.    Thank you for choosing [place] at [facility] to provide your oncology and hematology care.  To afford each patient quality time with our provider, please arrive at least 15 minutes before your scheduled appointment time.   If you have a lab appointment with the [facility] please come in thru the  Main Entrance and check in at the main information desk  You need to re-schedule your appointment should you arrive 10 or more minutes late.  We strive to give you quality time with our providers, and arriving late affects you and other patients whose appointments are after yours.  Also, if you no show three or more times for appointments you may be dismissed from the clinic at the providers discretion.     Again, thank you for choosing [facility].  Our hope is that these requests will decrease the amount of time that you wait before being seen by our physicians.       _____________________________________________________________  Should you have questions after your visit to [facility], please contact our office at [phone] between the hours of 8:00 a.m. and 4:30 p.m.  Voicemails left after 4:00 p.m. will not be returned until the following business day.  For prescription refill requests, have your pharmacy contact our office and allow 72 hours.    Cancer Center Support Programs:   > Cancer Support Group  2nd Tuesday of the month 1pm-2pm, Journey Room

## 2020-01-29 NOTE — Assessment & Plan Note (Signed)
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  Revlimid started on 10/06/2019 due to delay. -Velcade held since 12/15/2019 due to gastric symptoms, which have resolved completely. -She was evaluated by Dr. Samule Ohm at [facility].  Recommended to have immediate bone marrow transplant. -She has compression fractures of T8, T10 and T12.  Also L1 and L3 compression fractures.  Plan: 1.  IgA lambda plasma cell myeloma: -She is continuing Revlimid and dexamethasone without any problems.  We have cut back dexamethasone to 20 mg weekly.  Denies any GI symptoms. -I reviewed myeloma panel from 12/29/2019 which did not show M spike.  Free light chain ratio is 1.21 with free kappa light chains of 24 and lambda light chains 19.9. -I have reviewed routine labs which are grossly within normal limits. -I have told her to reach out to the transplant team at [facility].  I will see her back in 4 weeks for follow-up.  2.  Back pain: -She is wearing a brace today.  She takes hydrocodone 1 tablet as needed.  3.  Bone protection: -She was started on Zometa on 12/01/2019.  She is tolerating it very well.  4.  Hypokalemia: -Continue potassium 20 mg twice daily.  Potassium is normal today.  5.  ID prophylaxis: -Continue acyclovir twice daily.  Continue aspirin 81 mg for thromboprophylaxis.

## 2020-02-03 ENCOUNTER — Other Ambulatory Visit (HOSPITAL_COMMUNITY): Payer: Self-pay | Admitting: *Deleted

## 2020-02-03 ENCOUNTER — Encounter (HOSPITAL_COMMUNITY): Payer: Self-pay

## 2020-02-03 ENCOUNTER — Other Ambulatory Visit: Payer: Self-pay

## 2020-02-03 ENCOUNTER — Inpatient Hospital Stay (HOSPITAL_COMMUNITY): Payer: BC Managed Care – PPO

## 2020-02-03 VITALS — BP 129/63 | HR 58 | Temp 97.7°F | Resp 18

## 2020-02-03 DIAGNOSIS — C9000 Multiple myeloma not having achieved remission: Secondary | ICD-10-CM | POA: Diagnosis not present

## 2020-02-03 DIAGNOSIS — D472 Monoclonal gammopathy: Secondary | ICD-10-CM

## 2020-02-03 LAB — CBC WITH DIFFERENTIAL/PLATELET
Abs Immature Granulocytes: 0.01 10*3/uL (ref 0.00–0.07)
Basophils Absolute: 0 10*3/uL (ref 0.0–0.1)
Basophils Relative: 1 %
Eosinophils Absolute: 0.2 10*3/uL (ref 0.0–0.5)
Eosinophils Relative: 5 %
HCT: 35.8 % — ABNORMAL LOW (ref 36.0–46.0)
Hemoglobin: 11.5 g/dL — ABNORMAL LOW (ref 12.0–15.0)
Immature Granulocytes: 0 %
Lymphocytes Relative: 29 %
Lymphs Abs: 1 10*3/uL (ref 0.7–4.0)
MCH: 31.6 pg (ref 26.0–34.0)
MCHC: 32.1 g/dL (ref 30.0–36.0)
MCV: 98.4 fL (ref 80.0–100.0)
Monocytes Absolute: 0.6 10*3/uL (ref 0.1–1.0)
Monocytes Relative: 15 %
Neutro Abs: 1.8 10*3/uL (ref 1.7–7.7)
Neutrophils Relative %: 50 %
Platelets: 184 10*3/uL (ref 150–400)
RBC: 3.64 MIL/uL — ABNORMAL LOW (ref 3.87–5.11)
RDW: 15.4 % (ref 11.5–15.5)
WBC: 3.6 10*3/uL — ABNORMAL LOW (ref 4.0–10.5)
nRBC: 0 % (ref 0.0–0.2)

## 2020-02-03 LAB — COMPREHENSIVE METABOLIC PANEL
ALT: 27 U/L (ref 0–44)
AST: 21 U/L (ref 15–41)
Albumin: 3.8 g/dL (ref 3.5–5.0)
Alkaline Phosphatase: 112 U/L (ref 38–126)
Anion gap: 9 (ref 5–15)
BUN: 19 mg/dL (ref 8–23)
CO2: 28 mmol/L (ref 22–32)
Calcium: 8.9 mg/dL (ref 8.9–10.3)
Chloride: 102 mmol/L (ref 98–111)
Creatinine, Ser: 0.86 mg/dL (ref 0.44–1.00)
GFR calc Af Amer: >60 mL/min (ref >60)
GFR calc non Af Amer: >60 mL/min (ref >60)
Glucose, Bld: 97 mg/dL (ref 70–99)
Potassium: 3.1 mmol/L — ABNORMAL LOW (ref 3.5–5.1)
Sodium: 139 mmol/L (ref 135–145)
Total Bilirubin: 1.1 mg/dL (ref 0.3–1.2)
Total Protein: 6.5 g/dL (ref 6.5–8.1)

## 2020-02-03 MED ORDER — ZOLEDRONIC ACID 4 MG/100ML IV SOLN
4.0000 mg | Freq: Once | INTRAVENOUS | Status: AC
  Administered 2020-02-03: 4 mg via INTRAVENOUS

## 2020-02-03 MED ORDER — SODIUM CHLORIDE 0.9 % IV SOLN: Freq: Once | INTRAVENOUS | Status: AC

## 2020-02-03 MED ORDER — ZOLEDRONIC ACID 4 MG/100ML IV SOLN
INTRAVENOUS | Status: AC
  Filled 2020-02-03: qty 100

## 2020-02-03 MED ORDER — LENALIDOMIDE 25 MG PO CAPS: ORAL_CAPSULE | ORAL | 0 refills | Status: DC

## 2020-02-03 NOTE — Progress Notes (Signed)
Potassium 3.1 today.  Patient drank some boost last week and had some soft stools.  Patient is taking her potassium tabs as directed.   No s/s of distress noted.    Patient tolerated Zometa with no complaints voiced.  Peripheral IV site clean and dry with good blood return noted before and after infusion.  Band aid applied.    Reviewed labs with Dr. Delton Coombes with verbal order ok to discharge patient. Patient left with VSs and in satisfactory condition.

## 2020-02-16 ENCOUNTER — Encounter (HOSPITAL_COMMUNITY): Payer: Self-pay

## 2020-02-19 ENCOUNTER — Other Ambulatory Visit (HOSPITAL_COMMUNITY): Payer: Self-pay | Admitting: *Deleted

## 2020-02-19 ENCOUNTER — Telehealth (HOSPITAL_COMMUNITY): Payer: Self-pay | Admitting: *Deleted

## 2020-02-19 MED ORDER — POTASSIUM CHLORIDE ER 10 MEQ PO TBCR: 20.0000 meq | EXTENDED_RELEASE_TABLET | Freq: Three times a day (TID) | ORAL | 2 refills | Status: DC

## 2020-02-22 ENCOUNTER — Encounter (HOSPITAL_COMMUNITY): Payer: Self-pay

## 2020-02-23 ENCOUNTER — Other Ambulatory Visit (HOSPITAL_COMMUNITY): Payer: Self-pay | Admitting: *Deleted

## 2020-02-23 DIAGNOSIS — D472 Monoclonal gammopathy: Secondary | ICD-10-CM

## 2020-02-23 MED ORDER — LENALIDOMIDE 25 MG PO CAPS: ORAL_CAPSULE | ORAL | 0 refills | Status: DC

## 2020-02-25 ENCOUNTER — Encounter (HOSPITAL_COMMUNITY): Payer: Self-pay

## 2020-02-29 ENCOUNTER — Inpatient Hospital Stay (HOSPITAL_COMMUNITY): Payer: BC Managed Care – PPO

## 2020-02-29 ENCOUNTER — Other Ambulatory Visit: Payer: Self-pay

## 2020-02-29 ENCOUNTER — Inpatient Hospital Stay (HOSPITAL_COMMUNITY): Payer: BC Managed Care – PPO | Attending: Oncology | Admitting: Oncology

## 2020-02-29 VITALS — BP 135/75 | HR 57 | Temp 98.4°F | Resp 18 | Wt 154.0 lb

## 2020-02-29 DIAGNOSIS — Z818 Family history of other mental and behavioral disorders: Secondary | ICD-10-CM | POA: Diagnosis not present

## 2020-02-29 DIAGNOSIS — M4855XA Collapsed vertebra, not elsewhere classified, thoracolumbar region, initial encounter for fracture: Secondary | ICD-10-CM | POA: Diagnosis not present

## 2020-02-29 DIAGNOSIS — Z833 Family history of diabetes mellitus: Secondary | ICD-10-CM | POA: Diagnosis not present

## 2020-02-29 DIAGNOSIS — E876 Hypokalemia: Secondary | ICD-10-CM | POA: Diagnosis not present

## 2020-02-29 DIAGNOSIS — C9000 Multiple myeloma not having achieved remission: Secondary | ICD-10-CM | POA: Diagnosis present

## 2020-02-29 DIAGNOSIS — D709 Neutropenia, unspecified: Secondary | ICD-10-CM | POA: Insufficient documentation

## 2020-02-29 DIAGNOSIS — Z8379 Family history of other diseases of the digestive system: Secondary | ICD-10-CM | POA: Diagnosis not present

## 2020-02-29 DIAGNOSIS — Z171 Estrogen receptor negative status [ER-]: Secondary | ICD-10-CM | POA: Diagnosis not present

## 2020-02-29 DIAGNOSIS — C50912 Malignant neoplasm of unspecified site of left female breast: Secondary | ICD-10-CM | POA: Diagnosis not present

## 2020-02-29 DIAGNOSIS — Z79899 Other long term (current) drug therapy: Secondary | ICD-10-CM | POA: Diagnosis not present

## 2020-02-29 DIAGNOSIS — Z800 Family history of malignant neoplasm of digestive organs: Secondary | ICD-10-CM | POA: Diagnosis not present

## 2020-02-29 DIAGNOSIS — Z8249 Family history of ischemic heart disease and other diseases of the circulatory system: Secondary | ICD-10-CM | POA: Insufficient documentation

## 2020-02-29 LAB — CBC WITH DIFFERENTIAL/PLATELET
Abs Immature Granulocytes: 0 10*3/uL (ref 0.00–0.07)
Basophils Absolute: 0 10*3/uL (ref 0.0–0.1)
Basophils Relative: 1 %
Eosinophils Absolute: 0.1 10*3/uL (ref 0.0–0.5)
Eosinophils Relative: 4 %
HCT: 35.8 % — ABNORMAL LOW (ref 36.0–46.0)
Hemoglobin: 11.5 g/dL — ABNORMAL LOW (ref 12.0–15.0)
Immature Granulocytes: 0 %
Lymphocytes Relative: 28 %
Lymphs Abs: 0.7 10*3/uL (ref 0.7–4.0)
MCH: 32.6 pg (ref 26.0–34.0)
MCHC: 32.1 g/dL (ref 30.0–36.0)
MCV: 101.4 fL — ABNORMAL HIGH (ref 80.0–100.0)
Monocytes Absolute: 0.2 10*3/uL (ref 0.1–1.0)
Monocytes Relative: 9 %
Neutro Abs: 1.4 10*3/uL — ABNORMAL LOW (ref 1.7–7.7)
Neutrophils Relative %: 58 %
Platelets: 177 10*3/uL (ref 150–400)
RBC: 3.53 MIL/uL — ABNORMAL LOW (ref 3.87–5.11)
RDW: 15.6 % — ABNORMAL HIGH (ref 11.5–15.5)
WBC: 2.4 10*3/uL — ABNORMAL LOW (ref 4.0–10.5)
nRBC: 0 % (ref 0.0–0.2)

## 2020-02-29 LAB — COMPREHENSIVE METABOLIC PANEL
ALT: 39 U/L (ref 0–44)
AST: 18 U/L (ref 15–41)
Albumin: 3.6 g/dL (ref 3.5–5.0)
Alkaline Phosphatase: 133 U/L — ABNORMAL HIGH (ref 38–126)
Anion gap: 8 (ref 5–15)
BUN: 10 mg/dL (ref 8–23)
CO2: 26 mmol/L (ref 22–32)
Calcium: 8.5 mg/dL — ABNORMAL LOW (ref 8.9–10.3)
Chloride: 106 mmol/L (ref 98–111)
Creatinine, Ser: 0.74 mg/dL (ref 0.44–1.00)
GFR calc Af Amer: >60 mL/min (ref >60)
GFR calc non Af Amer: >60 mL/min (ref >60)
Glucose, Bld: 100 mg/dL — ABNORMAL HIGH (ref 70–99)
Potassium: 3.8 mmol/L (ref 3.5–5.1)
Sodium: 140 mmol/L (ref 135–145)
Total Bilirubin: 0.5 mg/dL (ref 0.3–1.2)
Total Protein: 6.4 g/dL — ABNORMAL LOW (ref 6.5–8.1)

## 2020-02-29 LAB — LACTATE DEHYDROGENASE: LDH: 118 U/L (ref 98–192)

## 2020-02-29 MED ORDER — SODIUM CHLORIDE 0.9 % IV SOLN: Freq: Once | INTRAVENOUS | Status: AC

## 2020-02-29 MED ORDER — ZOLEDRONIC ACID 4 MG/100ML IV SOLN
4.0000 mg | Freq: Once | INTRAVENOUS | Status: AC
  Administered 2020-02-29: 4 mg via INTRAVENOUS
  Filled 2020-02-29: qty 100

## 2020-02-29 NOTE — Patient Instructions (Signed)
[place] at [facility] Discharge Instructions  You were seen today by Kirby Crigler PA. He went over your recent lab results. He would like you to take 3 tums a day to help increase your calcium. Continue taking your calcium daily. Start taking Zinc 50mg  three times a day to help with your taste. Continue taking your Revlimid and Dexamethasone. He will see you back in 4 weeks for labs, treatment and follow up.   Thank you for choosing [place] at [facility] to provide your oncology and hematology care.  To afford each patient quality time with our provider, please arrive at least 15 minutes before your scheduled appointment time.   If you have a lab appointment with the [facility] please come in thru the  Main Entrance and check in at the main information desk  You need to re-schedule your appointment should you arrive 10 or more minutes late.  We strive to give you quality time with our providers, and arriving late affects you and other patients whose appointments are after yours.  Also, if you no show three or more times for appointments you may be dismissed from the clinic at the providers discretion.     Again, thank you for choosing [facility].  Our hope is that these requests will decrease the amount of time that you wait before being seen by our physicians.       _____________________________________________________________  Should you have questions after your visit to [facility], please contact our office at [phone] between the hours of 8:00 a.m. and 4:30 p.m.  Voicemails left after 4:00 p.m. will not be returned until the following business day.  For prescription refill requests, have your pharmacy contact our office and allow 72 hours.    Cancer Center Support Programs:   > Cancer Support Group  2nd Tuesday of the month 1pm-2pm, Journey Room

## 2020-02-29 NOTE — Progress Notes (Signed)
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  Revlimid started on 10/06/2019 due to delay. -Velcade held since 12/15/2019 due to gastric symptoms, which have resolved completely. -She was evaluated by Dr. Samule Ohm at [facility]  Recommended to have immediate bone marrow transplant due to concern with prolonged Revlimid exposure and history of breast cancer.  Patient is considering this treatment modality option. -She has compression fractures of T8, T10 and T12.  Also L1 and L3 compression fractures. - On bone modifying agent, Zometa every 4 weeks. - Currently on Revlimid/Dexamethasone. AND History breast cancer: multifocal breast cancer left side ER/PR negative HER-2/neu over amplified with 2 positive axillary lymph nodes but negative margins no LV I and she was diagnosed in April 2006 with surgery on 01/15/2005 consisting of a simple mastectomy, left axillary sentinel node dissection. She received adjuvant chemotherapy consisting of Taxotere and Cytoxan in a dose dense fashion x4 cycles with Herceptin for 52 weeks. All 3 drugs were started concomitantly.    CURRENT STATUS: Stephanie Galvan 64 y.o. female returns for followup of multiple myeloma, currently on Revlimid/Dexamethasone and bone modifying treatment with Zometa every 4 weeks.  She is doing well overall from a hematology perspective.  She has reached out to Dr. Samule Ohm at [facility] today to get set up for re-consultation for bone marrow transplant.  She has an appt on 03/25/2020.  She notes a recent development of bilateral rib pain but she thinks it was from lifting a heavy object.  She denies any pain currently.  No  new B symptoms.  She denies any recent infections or antibiotic therapy.  Review of Systems  Constitutional: Negative.  Negative for chills, fever and weight loss.  HENT: Negative.   Eyes: Negative.   Respiratory: Negative.  Negative for cough.   Cardiovascular: Negative.  Negative for chest pain.  Gastrointestinal: Negative.  Negative for blood in stool, constipation, diarrhea, melena, nausea and vomiting.  Genitourinary: Negative.   Musculoskeletal: Negative.   Skin: Negative.   Neurological: Negative.  Negative for weakness.  Endo/Heme/Allergies: Negative.   Psychiatric/Behavioral: Negative.     Past Medical History:  Diagnosis Date  . Breast cancer ([place]) 2006   left breast  . Breast mass, right   . Compression fracture of cervical spine ([place]) 06/18/2019   recent Chest xray stating had fx.  . High cholesterol   . History of bladder infections   . Hypertension   . Rash Aug 2013    Past Surgical History:  Procedure Laterality Date  . ABDOMINAL HYSTERECTOMY     just uterus removed  . BREAST BIOPSY    . BREAST LUMPECTOMY WITH RADIOACTIVE SEED LOCALIZATION Right 07/24/2018   Procedure: RIGHT BREAST LUMPECTOMY WITH RADIOACTIVE SEED LOCALIZATION;  Surgeon: Coralie Keens, MD;  Location: [facility];  Service: General;  Laterality: Right;  . MASTECTOMY  5/06   left  . PARTIAL HYSTERECTOMY    . PORT-A-CATH REMOVAL    . PORTACATH PLACEMENT      Family History  Problem Relation Age of Onset  . Dementia Mother   . Stomach cancer Father   . Cancer Brother   . Cancer Brother   . Diabetes  Brother   . Colitis Daughter   . Hypertension Daughter   . Hypertension Daughter     Social History   Socioeconomic History  . Marital status: Married    Spouse name: Not on file  . Number of children: 2  . Years of education: Not on file  . Highest education level: Not on file  Occupational History    Employer: [employer]  Tobacco Use  . Smoking status:  Never Smoker  . Smokeless tobacco: Never Used  Vaping Use  . Vaping Use: Never used  Substance and Sexual Activity  . Alcohol use: No  . Drug use: No  . Sexual activity: Yes    Birth control/protection: Surgical  Other Topics Concern  . Not on file  Social History Narrative  . Not on file   Social Determinants of Health   Financial Resource Strain:   . Difficulty of Paying Living Expenses:   Food Insecurity:   . Worried About Charity fundraiser in the Last Year:   . Arboriculturist in the Last Year:   Transportation Needs:   . Film/video editor (Medical):   Marland Kitchen Lack of Transportation (Non-Medical):   Physical Activity:   . Days of Exercise per Week:   . Minutes of Exercise per Session:   Stress:   . Feeling of Stress :   Social Connections:   . Frequency of Communication with Friends and Family:   . Frequency of Social Gatherings with Friends and Family:   . Attends Religious Services:   . Active Member of Clubs or Organizations:   . Attends Archivist Meetings:   Marland Kitchen Marital Status:      PHYSICAL EXAMINATION  ECOG PERFORMANCE STATUS: 0 - Asymptomatic  Vitals:   02/29/20 1316  BP: 135/75  Pulse: (!) 57  Resp: 18  Temp: 98.4 F (36.9 C)  SpO2: 99%    GENERAL:alert, no distress, well nourished, well developed, comfortable, cooperative and smiling SKIN: skin color, texture, turgor are normal, no rashes or significant lesions HEAD: Normocephalic, No masses, lesions, tenderness or abnormalities EYES: normal EARS: External ears normal OROPHARYNX:not examined- wearing mask  NECK: supple, no adenopathy, non-tender LYMPH:  no palpable lymphadenopathy, no hepatosplenomegaly BREAST:not examined LUNGS: clear to auscultation and percussion HEART: regular rate & rhythm, no murmurs and no gallops ABDOMEN:abdomen soft, non-tender, normal bowel sounds and no masses or organomegaly BACK: Back symmetric, no curvature. EXTREMITIES:less then 2 second capillary  refill, no edema  NEURO: alert & oriented x 3 with fluent speech, no focal motor/sensory deficits, gait normal   LABORATORY DATA: CBC    Component Value Date/Time   WBC 2.4 (L) 02/29/2020 1212   RBC 3.53 (L) 02/29/2020 1212   HGB 11.5 (L) 02/29/2020 1212   HCT 35.8 (L) 02/29/2020 1212   PLT 177 02/29/2020 1212   MCV 101.4 (H) 02/29/2020 1212   MCH 32.6 02/29/2020 1212   MCHC 32.1 02/29/2020 1212   RDW 15.6 (H) 02/29/2020 1212   LYMPHSABS 0.7 02/29/2020 1212   MONOABS 0.2 02/29/2020 1212   EOSABS 0.1 02/29/2020 1212   BASOSABS 0.0 02/29/2020 1212      Chemistry      Component Value Date/Time   NA 140 02/29/2020 1212   K 3.8 02/29/2020 1212   CL 106 02/29/2020 1212   CO2 26 02/29/2020 1212   BUN 10 02/29/2020 1212   CREATININE 0.74 02/29/2020 1212      Component Value Date/Time   CALCIUM 8.5 (L)  02/29/2020 1212   ALKPHOS 133 (H) 02/29/2020 1212   AST 18 02/29/2020 1212   ALT 39 02/29/2020 1212   BILITOT 0.5 02/29/2020 1212       RADIOGRAPHIC STUDIES:  No results found.   PATHOLOGY:    ASSESSMENT AND PLAN:   1. 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  Revlimid started on 10/06/2019 due to delay. -Velcade held since 12/15/2019 due to gastric symptoms, which have resolved completely. -She was evaluated by [facility]. Samule Ohm at [facility].  Recommended to have immediate bone marrow transplant due to concern with prolonged Revlimid exposure and history of breast cancer.  Patient is considering this treatment modality option. -She has compression fractures of T8, T10 and T12.  Also L1 and L3 compression fractures. - On bone modifying agent, Zometa every 4 weeks. - Currently on Revlimid/Dexamethasone.  Labs updated today.  CBC is stable with minimal neutropenia of 1.4, stable anemia (11.5 g/dL), and plt count WNL at 177K.  Metabolic panel revealing a hypokalemia  with normal albumin.  LDH WNL.  SPEP + IFE are in process.  Will proceed with Zometa today as planned.  I have asked her to take 3 TUMS per day for her minimally low calcium.  Continue Revlimid/Dexamethasone as prescribed.  Nursing, in accordance with chemotherapy administration protocol, will monitor for acute side effects/toxicities associated with chemotherapy administration today.  Labs in 4 weeks.  Next Zometa infusion in 4 weeks.  I have reviewed all imaging and documentation related to her cancer care.  Return in 4 weeks for follow-up.   2. Malignant neoplasm of left breast in female, estrogen receptor negative, unspecified site of breast [facility]) History breast cancer: multifocal breast cancer left side ER/PR negative HER-2/neu over amplified with 2 positive axillary lymph nodes but negative margins no LV I and she was diagnosed in April 2006 with surgery on 01/15/2005 consisting of a simple mastectomy, left axillary sentinel node dissection. She received adjuvant chemotherapy consisting of Taxotere and Cytoxan in a dose dense fashion x4 cycles with Herceptin for 52 weeks. All 3 drugs were started concomitantly.    ORDERS PLACED FOR THIS ENCOUNTER: No orders of the defined types were placed in this encounter.   MEDICATIONS PRESCRIBED THIS ENCOUNTER: No orders of the defined types were placed in this encounter.   All questions were answered. The patient knows to call the clinic with any problems, questions or concerns. We can certainly see the patient much sooner if necessary.  Patient and plan discussed with Dr. Derek Jack and she is in agreement with the aforementioned.   This note is electronically signed by: Robynn Pane, PA-C 02/29/2020 1:39 PM

## 2020-02-29 NOTE — Progress Notes (Signed)
Tolerated Zometa infusion w/o adverse reaction.  Alert, in no distress.  Discharged ambulatory.

## 2020-03-01 LAB — PROTEIN ELECTROPHORESIS, SERUM
A/G Ratio: 1.3 (ref 0.7–1.7)
Albumin ELP: 3.3 g/dL (ref 2.9–4.4)
Alpha-1-Globulin: 0.2 g/dL (ref 0.0–0.4)
Alpha-2-Globulin: 0.7 g/dL (ref 0.4–1.0)
Beta Globulin: 1 g/dL (ref 0.7–1.3)
Gamma Globulin: 0.6 g/dL (ref 0.4–1.8)
Globulin, Total: 2.5 g/dL (ref 2.2–3.9)
Total Protein ELP: 5.8 g/dL — ABNORMAL LOW (ref 6.0–8.5)

## 2020-03-01 LAB — KAPPA/LAMBDA LIGHT CHAINS
Kappa free light chain: 16.3 mg/L (ref 3.3–19.4)
Kappa, lambda light chain ratio: 1.16 (ref 0.26–1.65)
Lambda free light chains: 14.1 mg/L (ref 5.7–26.3)

## 2020-03-08 ENCOUNTER — Encounter (HOSPITAL_COMMUNITY): Payer: Self-pay | Admitting: *Deleted

## 2020-03-08 NOTE — Progress Notes (Signed)
I spoke with Karalee Height, RN with transplant team at [facility].  I verified with her that I received patients schedule. She wants [place] to provide patient with a copy of her schedule.  I verified with Ms. Joneen Caraway that patient can receive her Zometa on 7/19. She states that zometa is okay at that time.    I have called and spoke with Ms. Greenlaw and advised that if she does not receive a copy of schedule in the mail sent by Ms. Joneen Caraway, to stop by our office to get a copy.  Her first appt with [facility] is 7/16.  I also advised her that once she completes this cycle of Revlimid, she will not get any further refills until after transplant.  She verbalizes understanding.

## 2020-03-24 ENCOUNTER — Encounter (HOSPITAL_COMMUNITY): Payer: Self-pay

## 2020-03-28 ENCOUNTER — Inpatient Hospital Stay (HOSPITAL_COMMUNITY): Payer: BC Managed Care – PPO | Attending: Hematology | Admitting: Hematology

## 2020-03-28 ENCOUNTER — Inpatient Hospital Stay (HOSPITAL_COMMUNITY): Payer: BC Managed Care – PPO

## 2020-03-28 ENCOUNTER — Encounter (HOSPITAL_COMMUNITY): Payer: Self-pay | Admitting: Hematology

## 2020-03-28 ENCOUNTER — Encounter (HOSPITAL_COMMUNITY): Payer: Self-pay | Admitting: *Deleted

## 2020-03-28 ENCOUNTER — Other Ambulatory Visit: Payer: Self-pay

## 2020-03-28 VITALS — BP 141/74 | HR 58 | Temp 96.8°F | Resp 18 | Wt 157.1 lb

## 2020-03-28 VITALS — BP 130/56 | HR 56 | Temp 97.6°F | Resp 18

## 2020-03-28 DIAGNOSIS — Z800 Family history of malignant neoplasm of digestive organs: Secondary | ICD-10-CM | POA: Diagnosis not present

## 2020-03-28 DIAGNOSIS — C9000 Multiple myeloma not having achieved remission: Secondary | ICD-10-CM

## 2020-03-28 DIAGNOSIS — Z7952 Long term (current) use of systemic steroids: Secondary | ICD-10-CM | POA: Diagnosis not present

## 2020-03-28 DIAGNOSIS — E876 Hypokalemia: Secondary | ICD-10-CM | POA: Insufficient documentation

## 2020-03-28 DIAGNOSIS — Z818 Family history of other mental and behavioral disorders: Secondary | ICD-10-CM | POA: Diagnosis not present

## 2020-03-28 DIAGNOSIS — R630 Anorexia: Secondary | ICD-10-CM | POA: Diagnosis not present

## 2020-03-28 DIAGNOSIS — R200 Anesthesia of skin: Secondary | ICD-10-CM | POA: Diagnosis not present

## 2020-03-28 DIAGNOSIS — Z8379 Family history of other diseases of the digestive system: Secondary | ICD-10-CM | POA: Diagnosis not present

## 2020-03-28 DIAGNOSIS — Z809 Family history of malignant neoplasm, unspecified: Secondary | ICD-10-CM | POA: Diagnosis not present

## 2020-03-28 DIAGNOSIS — Z853 Personal history of malignant neoplasm of breast: Secondary | ICD-10-CM | POA: Insufficient documentation

## 2020-03-28 DIAGNOSIS — Z79899 Other long term (current) drug therapy: Secondary | ICD-10-CM | POA: Diagnosis not present

## 2020-03-28 DIAGNOSIS — Z8249 Family history of ischemic heart disease and other diseases of the circulatory system: Secondary | ICD-10-CM | POA: Insufficient documentation

## 2020-03-28 DIAGNOSIS — R5383 Other fatigue: Secondary | ICD-10-CM | POA: Insufficient documentation

## 2020-03-28 DIAGNOSIS — Z833 Family history of diabetes mellitus: Secondary | ICD-10-CM | POA: Insufficient documentation

## 2020-03-28 DIAGNOSIS — M549 Dorsalgia, unspecified: Secondary | ICD-10-CM | POA: Insufficient documentation

## 2020-03-28 LAB — CBC WITH DIFFERENTIAL/PLATELET
Abs Immature Granulocytes: 0.01 10*3/uL (ref 0.00–0.07)
Basophils Absolute: 0 10*3/uL (ref 0.0–0.1)
Basophils Relative: 1 %
Eosinophils Absolute: 0.1 10*3/uL (ref 0.0–0.5)
Eosinophils Relative: 3 %
HCT: 35.4 % — ABNORMAL LOW (ref 36.0–46.0)
Hemoglobin: 11.6 g/dL — ABNORMAL LOW (ref 12.0–15.0)
Immature Granulocytes: 0 %
Lymphocytes Relative: 41 %
Lymphs Abs: 1.4 10*3/uL (ref 0.7–4.0)
MCH: 33 pg (ref 26.0–34.0)
MCHC: 32.8 g/dL (ref 30.0–36.0)
MCV: 100.9 fL — ABNORMAL HIGH (ref 80.0–100.0)
Monocytes Absolute: 0.5 10*3/uL (ref 0.1–1.0)
Monocytes Relative: 13 %
Neutro Abs: 1.4 10*3/uL — ABNORMAL LOW (ref 1.7–7.7)
Neutrophils Relative %: 42 %
Platelets: 213 10*3/uL (ref 150–400)
RBC: 3.51 MIL/uL — ABNORMAL LOW (ref 3.87–5.11)
RDW: 15.1 % (ref 11.5–15.5)
WBC: 3.4 10*3/uL — ABNORMAL LOW (ref 4.0–10.5)
nRBC: 0 % (ref 0.0–0.2)

## 2020-03-28 LAB — COMPREHENSIVE METABOLIC PANEL
ALT: 41 U/L (ref 0–44)
AST: 33 U/L (ref 15–41)
Albumin: 3.6 g/dL (ref 3.5–5.0)
Alkaline Phosphatase: 149 U/L — ABNORMAL HIGH (ref 38–126)
Anion gap: 9 (ref 5–15)
BUN: 13 mg/dL (ref 8–23)
CO2: 28 mmol/L (ref 22–32)
Calcium: 9 mg/dL (ref 8.9–10.3)
Chloride: 104 mmol/L (ref 98–111)
Creatinine, Ser: 0.73 mg/dL (ref 0.44–1.00)
GFR calc Af Amer: >60 mL/min (ref >60)
GFR calc non Af Amer: >60 mL/min (ref >60)
Glucose, Bld: 94 mg/dL (ref 70–99)
Potassium: 3.8 mmol/L (ref 3.5–5.1)
Sodium: 141 mmol/L (ref 135–145)
Total Bilirubin: 0.6 mg/dL (ref 0.3–1.2)
Total Protein: 6.8 g/dL (ref 6.5–8.1)

## 2020-03-28 MED ORDER — HEPARIN SOD (PORK) LOCK FLUSH 100 UNIT/ML IV SOLN: 500.0000 [IU] | Freq: Once | INTRAVENOUS | Status: DC | PRN

## 2020-03-28 MED ORDER — HEPARIN SOD (PORK) LOCK FLUSH 100 UNIT/ML IV SOLN: 250.0000 [IU] | Freq: Once | INTRAVENOUS | Status: DC | PRN

## 2020-03-28 MED ORDER — ZOLEDRONIC ACID 4 MG/100ML IV SOLN
4.0000 mg | Freq: Once | INTRAVENOUS | Status: AC
  Administered 2020-03-28: 4 mg via INTRAVENOUS
  Filled 2020-03-28: qty 100

## 2020-03-28 MED ORDER — SODIUM CHLORIDE 0.9% FLUSH
3.0000 mL | Freq: Once | INTRAVENOUS | Status: AC | PRN
  Administered 2020-03-28: 3 mL

## 2020-03-28 MED ORDER — SODIUM CHLORIDE 0.9 % IV SOLN: Freq: Once | INTRAVENOUS | Status: AC

## 2020-03-28 MED ORDER — ALTEPLASE 2 MG IJ SOLR: 2.0000 mg | Freq: Once | INTRAMUSCULAR | Status: DC | PRN

## 2020-03-28 MED ORDER — ERGOCALCIFEROL 1.25 MG (50000 UT) PO CAPS: 50000.0000 [IU] | ORAL_CAPSULE | ORAL | 1 refills | Status: DC

## 2020-03-28 NOTE — Progress Notes (Signed)
[place],  [address]   CLINIC:  Medical Oncology/Hematology  PCP:  [name], MD [place]. / [place] [address]  [phone]  REASON FOR VISIT:  Follow-up for multiple myeloma  PRIOR THERAPY: None  CURRENT THERAPY: Revlimid  INTERVAL HISTORY:  Stephanie Galvan, a 64 y.o. female, returns for routine follow-up for her multiple myeloma. Stephanie Galvan was last seen on 01/28/2020.  Today she reports feeling well. She took her last Revlimid on Tuesday, 03/22/2020. Her numbness in her feet is improving and her back pain is slowly getting better, though she reports having some occasional discomfort.  She is scheduled to get her transplant on 04/29/2020.   REVIEW OF SYSTEMS:  Review of Systems  Constitutional: Positive for appetite change (mildly decreased) and fatigue (mild).  Musculoskeletal: Positive for back pain (improving).  Neurological: Positive for numbness (feet).  All other systems reviewed and are negative.   PAST MEDICAL/SURGICAL HISTORY:  Past Medical History:  Diagnosis Date  . Breast cancer ([place]) 2006   left breast  . Breast mass, right   . Compression fracture of cervical spine ([place]) 06/18/2019   recent Chest xray stating had fx.  . High cholesterol   . History of bladder infections   . Hypertension   . Rash Aug 2013   Past Surgical History:  Procedure Laterality Date  . ABDOMINAL HYSTERECTOMY     just uterus removed  . BREAST BIOPSY    . BREAST LUMPECTOMY WITH RADIOACTIVE SEED LOCALIZATION Right 07/24/2018   Procedure: RIGHT BREAST LUMPECTOMY WITH RADIOACTIVE SEED LOCALIZATION;  Surgeon: Coralie Keens, MD;  Location: [facility];  Service: General;  Laterality: Right;  . MASTECTOMY  5/06   left  . PARTIAL HYSTERECTOMY    . PORT-A-CATH REMOVAL    . PORTACATH PLACEMENT      SOCIAL HISTORY:  Social History   Socioeconomic History  . Marital status: Married    Spouse  name: Not on file  . Number of children: 2  . Years of education: Not on file  . Highest education level: Not on file  Occupational History    Employer: [employer]  Tobacco Use  . Smoking status: Never Smoker  . Smokeless tobacco: Never Used  Vaping Use  . Vaping Use: Never used  Substance and Sexual Activity  . Alcohol use: No  . Drug use: No  . Sexual activity: Yes    Birth control/protection: Surgical  Other Topics Concern  . Not on file  Social History Narrative  . Not on file   Social Determinants of Health   Financial Resource Strain:   . Difficulty of Paying Living Expenses:   Food Insecurity:   . Worried About Charity fundraiser in the Last Year:   . Arboriculturist in the Last Year:   Transportation Needs:   . Film/video editor (Medical):   Marland Kitchen Lack of Transportation (Non-Medical):   Physical Activity:   . Days of Exercise per Week:   . Minutes of Exercise per Session:   Stress:   . Feeling of Stress :   Social Connections:   . Frequency of Communication with Friends and Family:   . Frequency of Social Gatherings with Friends and Family:   . Attends Religious Services:   . Active Member of Clubs or Organizations:   . Attends Archivist Meetings:   Marland Kitchen Marital Status:   Intimate Partner Violence:   .  Fear of Current or Ex-Partner:   . Emotionally Abused:   Marland Kitchen Physically Abused:   . Sexually Abused:     FAMILY HISTORY:  Family History  Problem Relation Age of Onset  . Dementia Mother   . Stomach cancer Father   . Cancer Brother   . Cancer Brother   . Diabetes Brother   . Colitis Daughter   . Hypertension Daughter   . Hypertension Daughter     CURRENT MEDICATIONS:  Current Outpatient Medications  Medication Sig Dispense Refill  . acyclovir (ZOVIRAX) 400 MG tablet TAKE 1 TABLET BY MOUTH TWICE A DAY 180 tablet 1  . aspirin EC 81 MG tablet Take 1 tablet (81 mg total) by mouth daily.    Marland Kitchen atenolol (TENORMIN) 50 MG tablet Take 50 mg  by mouth daily.      . calcium carbonate (TUMS - DOSED IN MG ELEMENTAL CALCIUM) 500 MG chewable tablet Chew 2 tablets by mouth 2 (two) times daily.    Marland Kitchen dexamethasone (DECADRON) 4 MG tablet Take 10 tablets (40 mg) on days 1, 8, and 15 of chemo. Repeat every 21 days. 30 tablet 3  . ergocalciferol (VITAMIN D2) 1.25 MG (50000 UT) capsule Take 1 capsule (50,000 Units total) by mouth once a week. 12 capsule 1  . furosemide (LASIX) 20 MG tablet TAKE 1 TABLET BY MOUTH EVERY DAY AS NEEDED 90 tablet 1  . gabapentin (NEURONTIN) 100 MG capsule Take 3 capsules (300 mg total) by mouth at bedtime. 60 capsule 1  . hydrochlorothiazide 25 MG tablet Take 25 mg by mouth daily.      Marland Kitchen lenalidomide (REVLIMID) 25 MG capsule Take one capsule daily on days 1-14 every 21 days. 14 capsule 0  . potassium chloride (KLOR-CON) 10 MEQ tablet Take 2 tablets (20 mEq total) by mouth 3 (three) times daily. 180 tablet 2  . Aspirin-Salicylamide-Caffeine (BC HEADACHE POWDER PO) Take 1 tablet by mouth as needed.  (Patient not taking: Reported on 03/28/2020)    . HYDROcodone-acetaminophen (NORCO) 10-325 MG tablet Take 1 tablet by mouth every 8 (eight) hours as needed. (Patient not taking: Reported on 03/28/2020) 30 tablet 0  . prochlorperazine (COMPAZINE) 10 MG tablet Take 1 tablet (10 mg total) by mouth every 6 (six) hours as needed for nausea or vomiting. (Patient not taking: Reported on 03/28/2020) 45 tablet 2   No current facility-administered medications for this visit.    ALLERGIES:  No Known Allergies  PHYSICAL EXAM:  Performance status (ECOG): 1 - Symptomatic but completely ambulatory  Vitals:   03/28/20 1251  BP: (!) 141/74  Pulse: (!) 58  Resp: 18  Temp: (!) 96.8 F (36 C)  SpO2: 100%   Wt Readings from Last 3 Encounters:  03/28/20 157 lb 1.6 oz (71.3 kg)  02/29/20 154 lb (69.9 kg)  01/28/20 160 lb 3.2 oz (72.7 kg)   Physical Exam Vitals reviewed.  Constitutional:      Appearance: Normal appearance.    Cardiovascular:     Rate and Rhythm: Normal rate and regular rhythm.     Pulses: Normal pulses.     Heart sounds: Normal heart sounds.  Pulmonary:     Effort: Pulmonary effort is normal.     Breath sounds: Normal breath sounds.  Musculoskeletal:     Cervical back: No bony tenderness.     Thoracic back: No bony tenderness.     Lumbar back: No bony tenderness.     Right lower leg: Edema (trace) present.  Left lower leg: Edema (trace) present.  Neurological:     General: No focal deficit present.     Mental Status: She is alert and oriented to person, place, and time.  Psychiatric:        Mood and Affect: Mood normal.        Behavior: Behavior normal.     LABORATORY DATA:  I have reviewed the labs as listed.  CBC Latest Ref Rng & Units 03/28/2020 02/29/2020 02/03/2020  WBC 4.0 - 10.5 K/uL 3.4(L) 2.4(L) 3.6(L)  Hemoglobin 12.0 - 15.0 g/dL 11.6(L) 11.5(L) 11.5(L)  Hematocrit 36 - 46 % 35.4(L) 35.8(L) 35.8(L)  Platelets 150 - 400 K/uL 213 177 184   CMP Latest Ref Rng & Units 03/28/2020 02/29/2020 02/03/2020  Glucose 70 - 99 mg/dL 94 100(H) 97  BUN 8 - 23 mg/dL 13 10 19   Creatinine 0.44 - 1.00 mg/dL 0.73 0.74 0.86  Sodium 135 - 145 mmol/L 141 140 139  Potassium 3.5 - 5.1 mmol/L 3.8 3.8 3.1(L)  Chloride 98 - 111 mmol/L 104 106 102  CO2 22 - 32 mmol/L 28 26 28   Calcium 8.9 - 10.3 mg/dL 9.0 8.5(L) 8.9  Total Protein 6.5 - 8.1 g/dL 6.8 6.4(L) 6.5  Total Bilirubin 0.3 - 1.2 mg/dL 0.6 0.5 1.1  Alkaline Phos 38 - 126 U/L 149(H) 133(H) 112  AST 15 - 41 U/L 33 18 21  ALT 0 - 44 U/L 41 39 27      Component Value Date/Time   RBC 3.51 (L) 03/28/2020 1208   MCV 100.9 (H) 03/28/2020 1208   MCH 33.0 03/28/2020 1208   MCHC 32.8 03/28/2020 1208   RDW 15.1 03/28/2020 1208   LYMPHSABS 1.4 03/28/2020 1208   MONOABS 0.5 03/28/2020 1208   EOSABS 0.1 03/28/2020 1208   BASOSABS 0.0 03/28/2020 1208   Lab Results  Component Value Date   LDH 118 02/29/2020   LDH 145 01/06/2020   LDH 130  12/15/2019   Lab Results  Component Value Date   TOTALPROTELP 5.8 (L) 02/29/2020   ALBUMINELP 3.3 02/29/2020   A1GS 0.2 02/29/2020   A2GS 0.7 02/29/2020   BETS 1.0 02/29/2020   GAMS 0.6 02/29/2020   MSPIKE Not Observed 02/29/2020   SPEI Comment 02/29/2020    Lab Results  Component Value Date   KPAFRELGTCHN 16.3 02/29/2020   LAMBDASER 14.1 02/29/2020   KAPLAMBRATIO 1.16 02/29/2020     DIAGNOSTIC IMAGING:  I have independently reviewed the scans and discussed with the patient. No results found.   ASSESSMENT:  1. 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  Revlimid started on 10/06/2019 due to delay. -Velcade held since 12/15/2019 due to gastric symptoms, which have resolved completely. -She was evaluated by Dr. Samule Ohm at [facility].  Recommended to have immediate bone marrow transplant. -She has compression fractures of T8, T10 and T12.  Also L1 and L3 compression fractures. -Revlimid and dexamethasone continued until 03/22/2020.   PLAN:  1.  IgA lambda plasma cell myeloma: -Myeloma labs from 02/29/2020 shows SPEP negative.  Free light chains are normal.  I have reviewed her labs from today. -She was evaluated at [facility] transplant team. -We will discontinue Revlimid and Velcade at this time.  She will receive G-CSF on 04/16/2020 and stem cell collection few days after that.  She will undergo auto stem cell transplant on 04/29/2020. -She will come back in 2 months for follow-up.  2.  Back pain: -This  is well controlled.  She is not requiring pain medication.  She is wearing a brace.  3.  Bone protection: -She will receive her last dose of Zometa today prior to transplant.  4.  Hypokalemia: -Continue potassium 20 mEq twice daily.  Potassium today is normal.  5.  ID prophylaxis: -Continue acyclovir and aspirin.   Orders placed this encounter:  No orders of the defined types were placed in this  encounter.    Derek Jack, MD [place] [phone]   I, Milinda Antis, am acting as a scribe for Dr. Sanda Linger.  I, Derek Jack MD, have reviewed the above documentation for accuracy and completeness, and I agree with the above.

## 2020-03-28 NOTE — Progress Notes (Signed)
Received a call from transplant coordinator at [facility] and they need reports from patient's bone imaging.  Her bone scan, bone survey and DG images of thoracic and lumbar spine sent to them via fax.  I have also asked radiology to powershare with [facility] so they can also view the actual scans.

## 2020-03-28 NOTE — Patient Instructions (Addendum)
[place] at [facility] Discharge Instructions  You were seen today by Dr. Delton Coombes. He went over your recent results. You received your injection today. Continue your monthly injections. Dr. Delton Coombes will see you back in 2 months for labs and follow up.   Thank you for choosing [place] at [facility] to provide your oncology and hematology care.  To afford each patient quality time with our provider, please arrive at least 15 minutes before your scheduled appointment time.   If you have a lab appointment with the [place] [facility] please come in thru the Main Entrance and check in at the main information desk  You need to re-schedule your appointment should you arrive 10 or more minutes late.  We strive to give you quality time with our providers, and arriving late affects you and other patients whose appointments are after yours.  Also, if you no show three or more times for appointments you may be dismissed from the clinic at the providers discretion.     Again, thank you for choosing [facility].  Our hope is that these requests will decrease the amount of time that you wait before being seen by our physicians.       _____________________________________________________________  Should you have questions after your visit to [facility], please contact our office at [phone] between the hours of 8:00 a.m. and 4:30 p.m.  Voicemails left after 4:00 p.m. will not be returned until the following business day.  For prescription refill requests, have your pharmacy contact our office and allow 72 hours.    Cancer Center Support Programs:   > Cancer Support Group  2nd Tuesday of the month 1pm-2pm, Journey Room

## 2020-03-28 NOTE — Progress Notes (Signed)
Patient tolerated Zometa with no complaints voiced.  Labs reviewed.  See MAR for details.  Peripheral Iv site clean and dry with no bruising or swelling noted.  Good blood return noted before and after infusion.  VSS with discharge and left in satisfactory condition with no s/s of distress noted.

## 2020-04-12 ENCOUNTER — Encounter (HOSPITAL_COMMUNITY): Payer: Self-pay

## 2020-04-13 ENCOUNTER — Other Ambulatory Visit (HOSPITAL_COMMUNITY): Payer: Self-pay | Admitting: *Deleted

## 2020-04-13 MED ORDER — HYDROCODONE-ACETAMINOPHEN 10-325 MG PO TABS: 1.0000 | ORAL_TABLET | Freq: Three times a day (TID) | ORAL | 0 refills | Status: DC | PRN

## 2020-04-22 ENCOUNTER — Encounter (HOSPITAL_COMMUNITY): Payer: Self-pay

## 2020-04-22 NOTE — Progress Notes (Signed)
Per Dr. Alvie Heidelberg at [facility], patient is not to get Zometa on Monday 04/25/2020. Appt cancelled. Patient aware.

## 2020-04-25 ENCOUNTER — Inpatient Hospital Stay (HOSPITAL_COMMUNITY): Payer: BC Managed Care – PPO

## 2020-04-25 ENCOUNTER — Ambulatory Visit (HOSPITAL_COMMUNITY): Payer: BC Managed Care – PPO

## 2020-04-26 ENCOUNTER — Other Ambulatory Visit (HOSPITAL_COMMUNITY): Payer: Self-pay

## 2020-04-26 DIAGNOSIS — D472 Monoclonal gammopathy: Secondary | ICD-10-CM

## 2020-04-26 MED ORDER — ACYCLOVIR 400 MG PO TABS: 400.0000 mg | ORAL_TABLET | Freq: Two times a day (BID) | ORAL | 1 refills | Status: DC

## 2020-04-27 ENCOUNTER — Ambulatory Visit (HOSPITAL_COMMUNITY): Payer: BC Managed Care – PPO

## 2020-04-27 ENCOUNTER — Other Ambulatory Visit (HOSPITAL_COMMUNITY): Payer: BC Managed Care – PPO

## 2020-05-02 IMAGING — DX DG LUMBAR SPINE COMPLETE 4+V
5 series · 5 of 5 positions shown · non-contrast
Comparison: CT Chest, Abdomen, and Pelvis 04/26/2015.

CLINICAL DATA: 63-year-old female with back pain radiating
anteriorly, to the abdomen. No recent injury.

EXAM:
LUMBAR SPINE - COMPLETE 4+ VIEW

[l-spine ap]
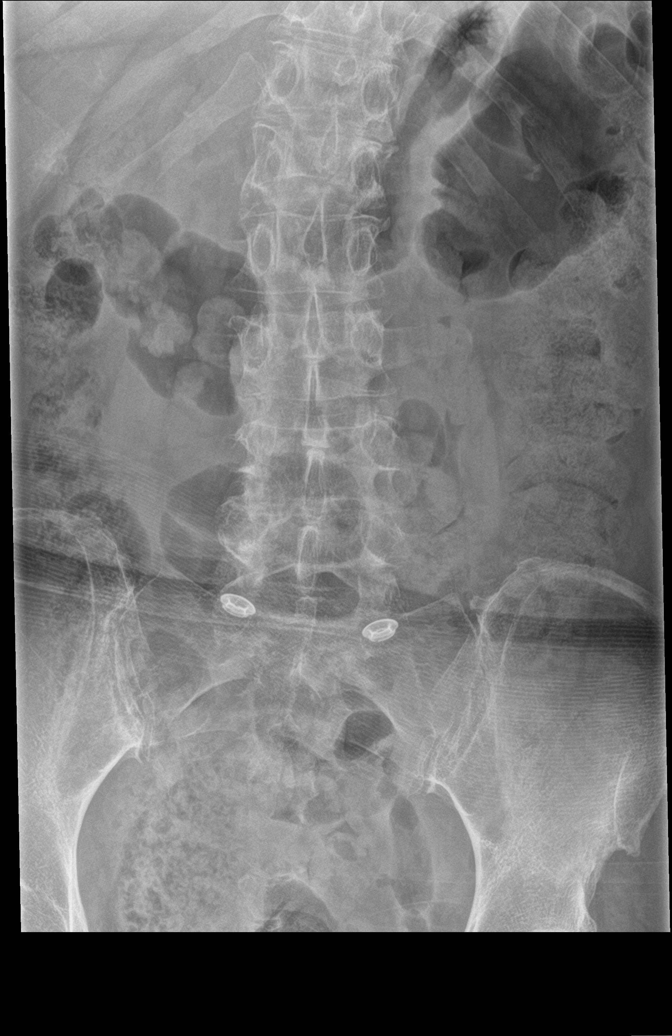

[l-spine obl (1 of 2)]
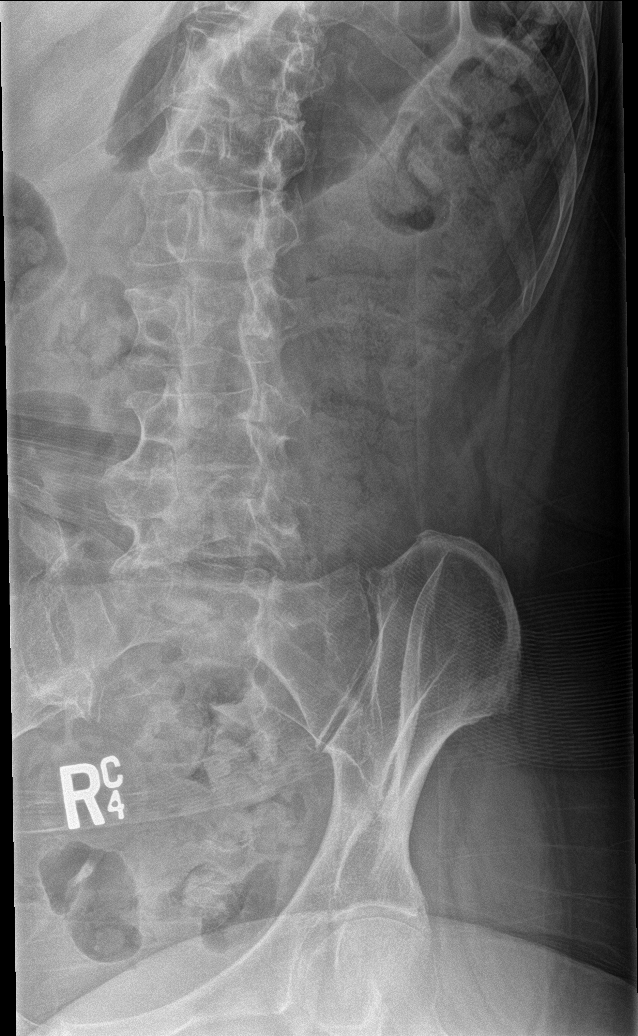

[l-spine obl (2 of 2)]
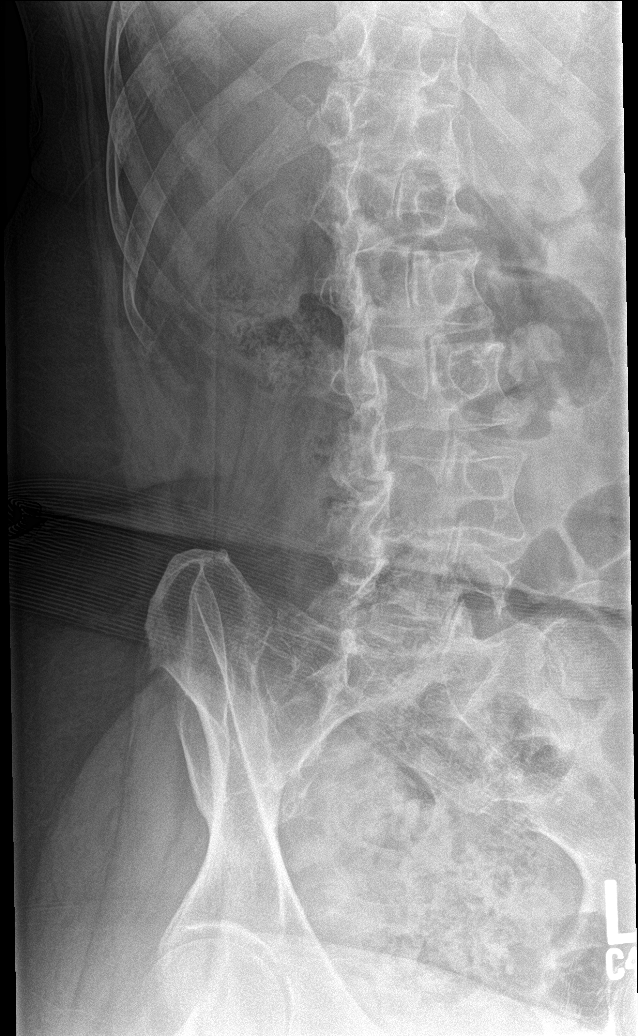

[l-spine lat]
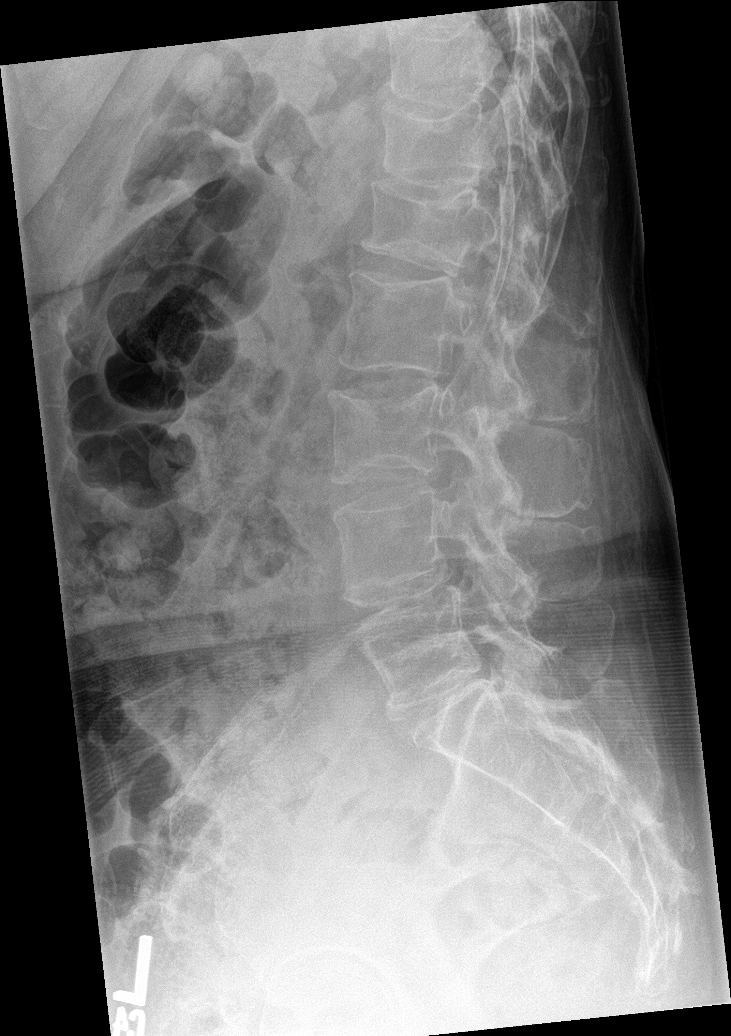

[l-spine spot]
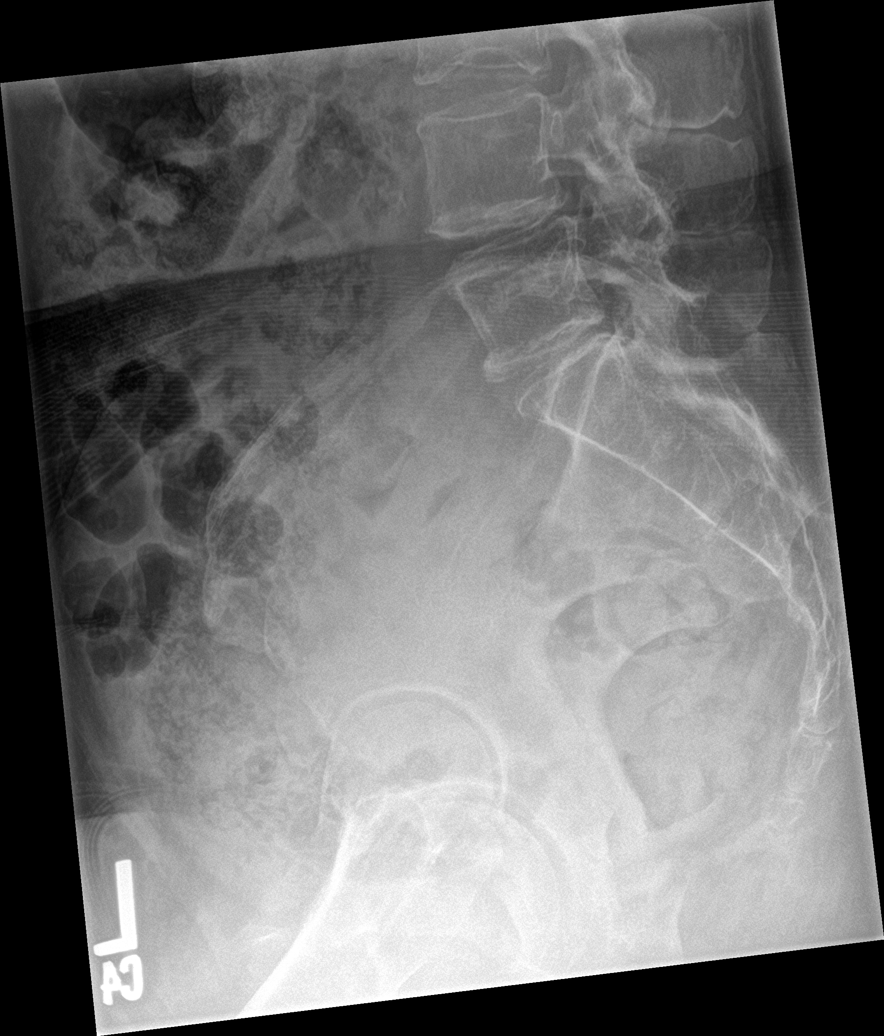

[5 of 5 positions shown; findings below may reference images not displayed]

FINDINGS: Normal lumbar segmentation on the comparison CT, with full size ribs
at T12. Stable lumbar lordosis. Mild superior and inferior endplate
compression at L3 is new or increased since 9320. Mild L2
compression appears stable. Moderate compression of both T12 and L1
appears increased but was present in 9320.

The L4 and L5 levels appear to remain intact. Grossly intact visible
sacrum and SI joints. Negative abdominal visceral contours.
IMPRESSION: Chronic but increased compression fractures in the lower thoracic
and upper lumbar spine since the 9320 CT.
No convincing acute osseous abnormality, but Lumbar MRI or Nuclear
Medicine Whole-body Bone Scan would have a higher sensitivity for
acute compression.

## 2020-05-02 IMAGING — DX DG THORACIC SPINE 3V
3 series · 3 of 3 positions shown · non-contrast
Comparison: Lumbar radiographs today reported separately. CT Chest,
Abdomen, and Pelvis 04/26/2015.

CLINICAL DATA: 63-year-old female with back pain radiating
anteriorly, to the abdomen. No recent injury.

EXAM:
THORACIC SPINE - 3 VIEWS

[t-spine ap]
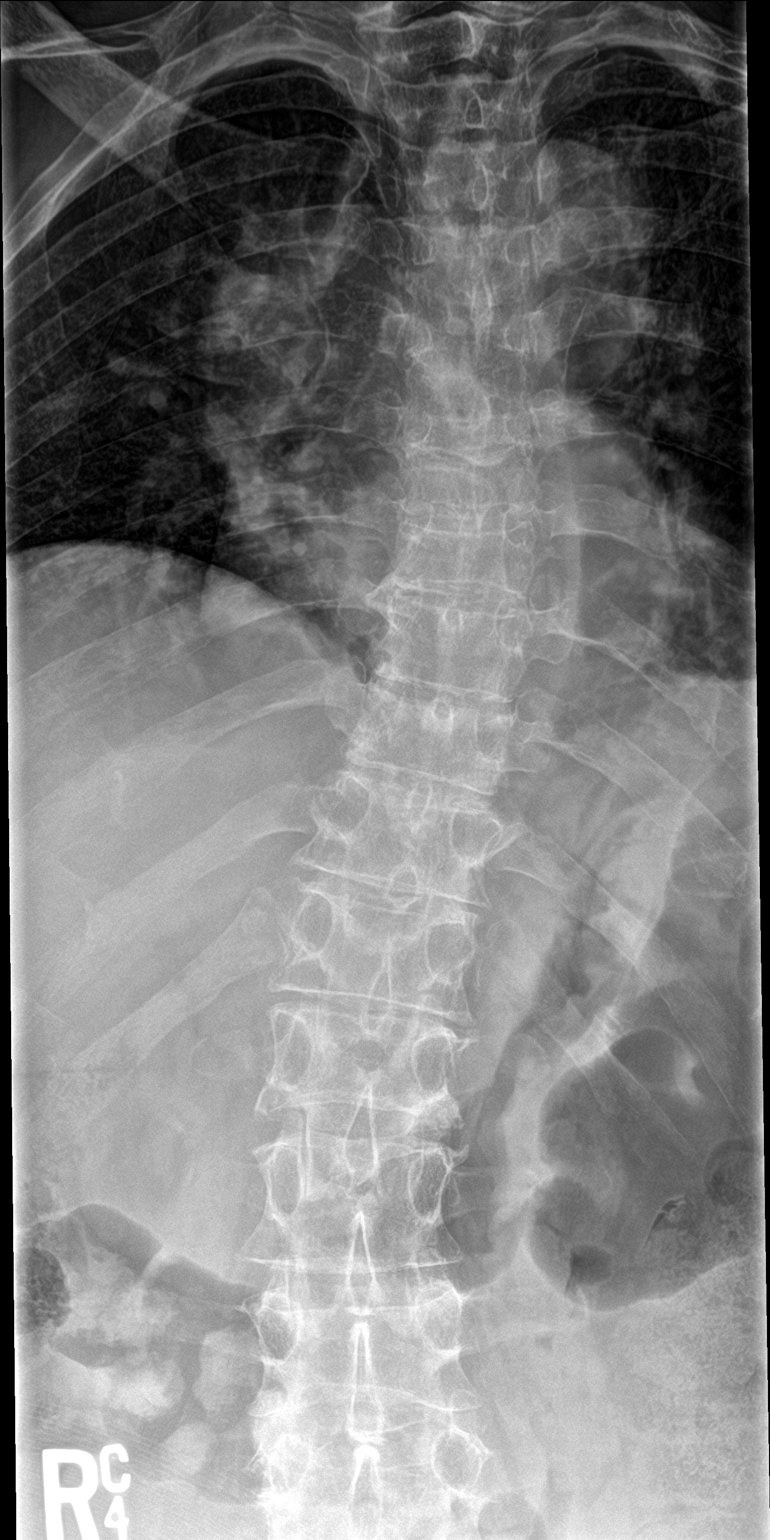

[t-spine lat]
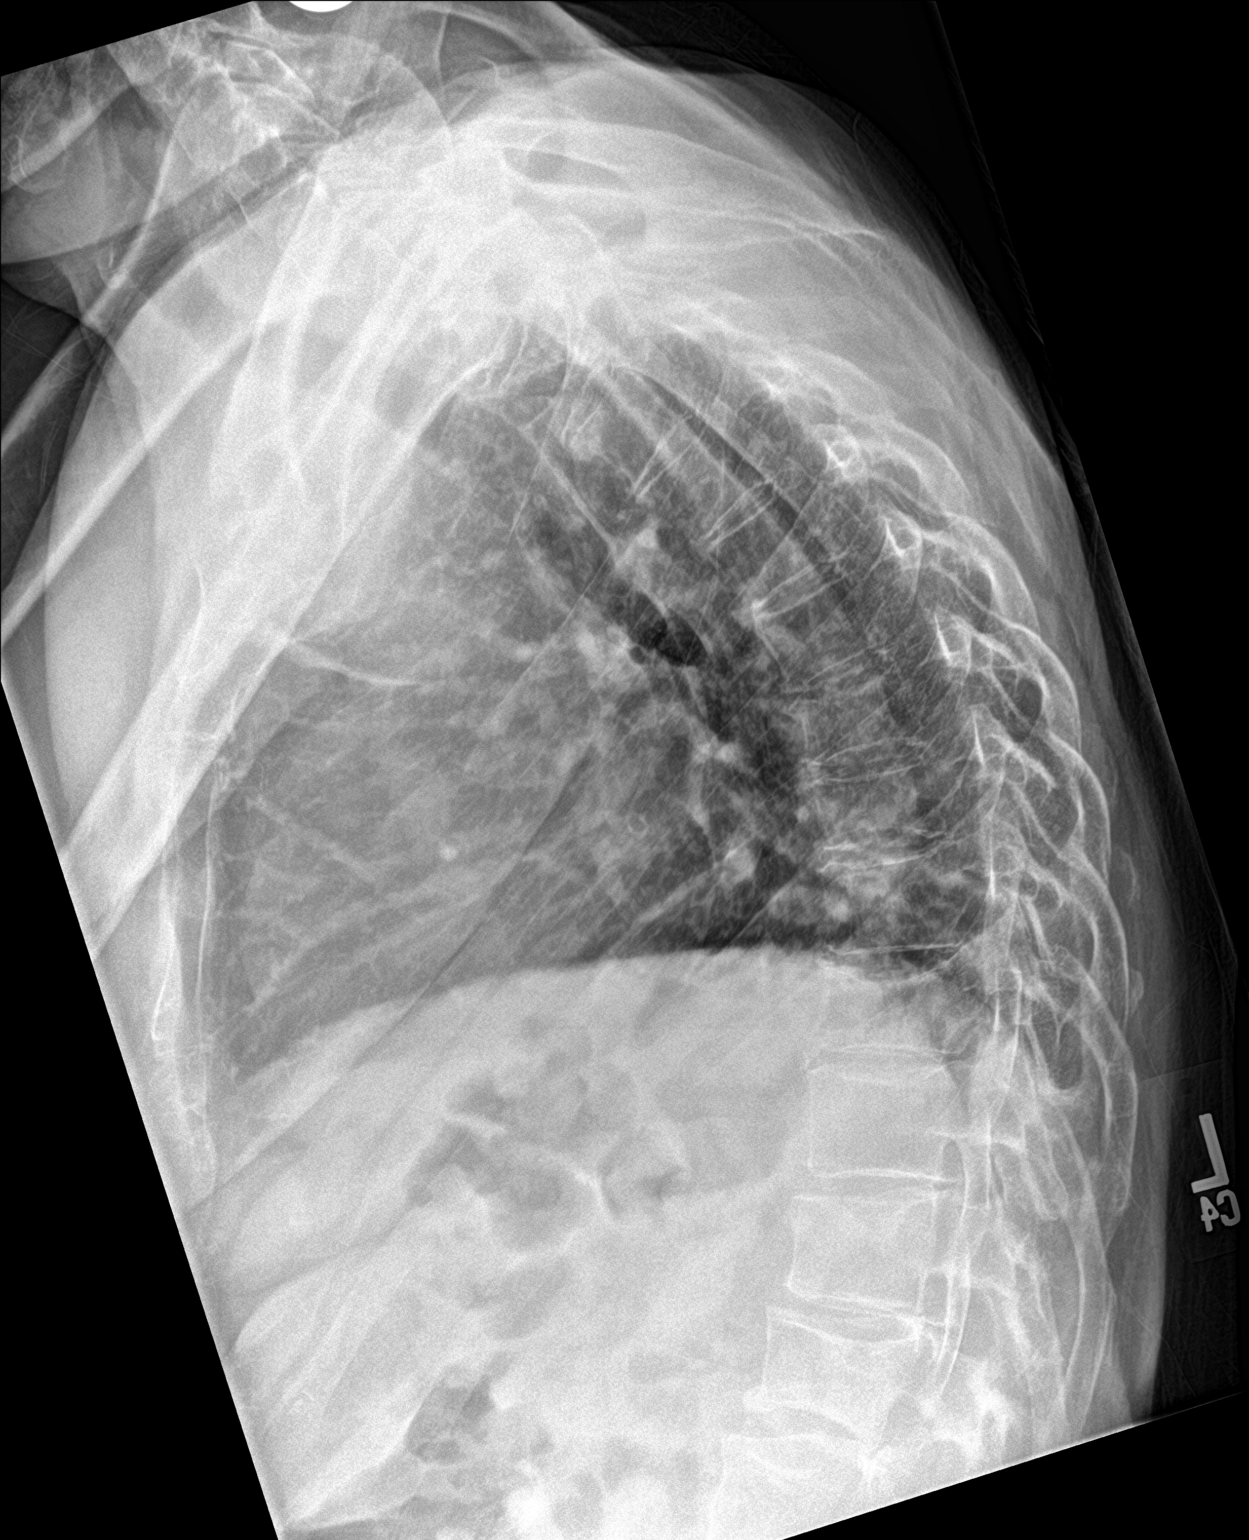

[t-spine swimmers]
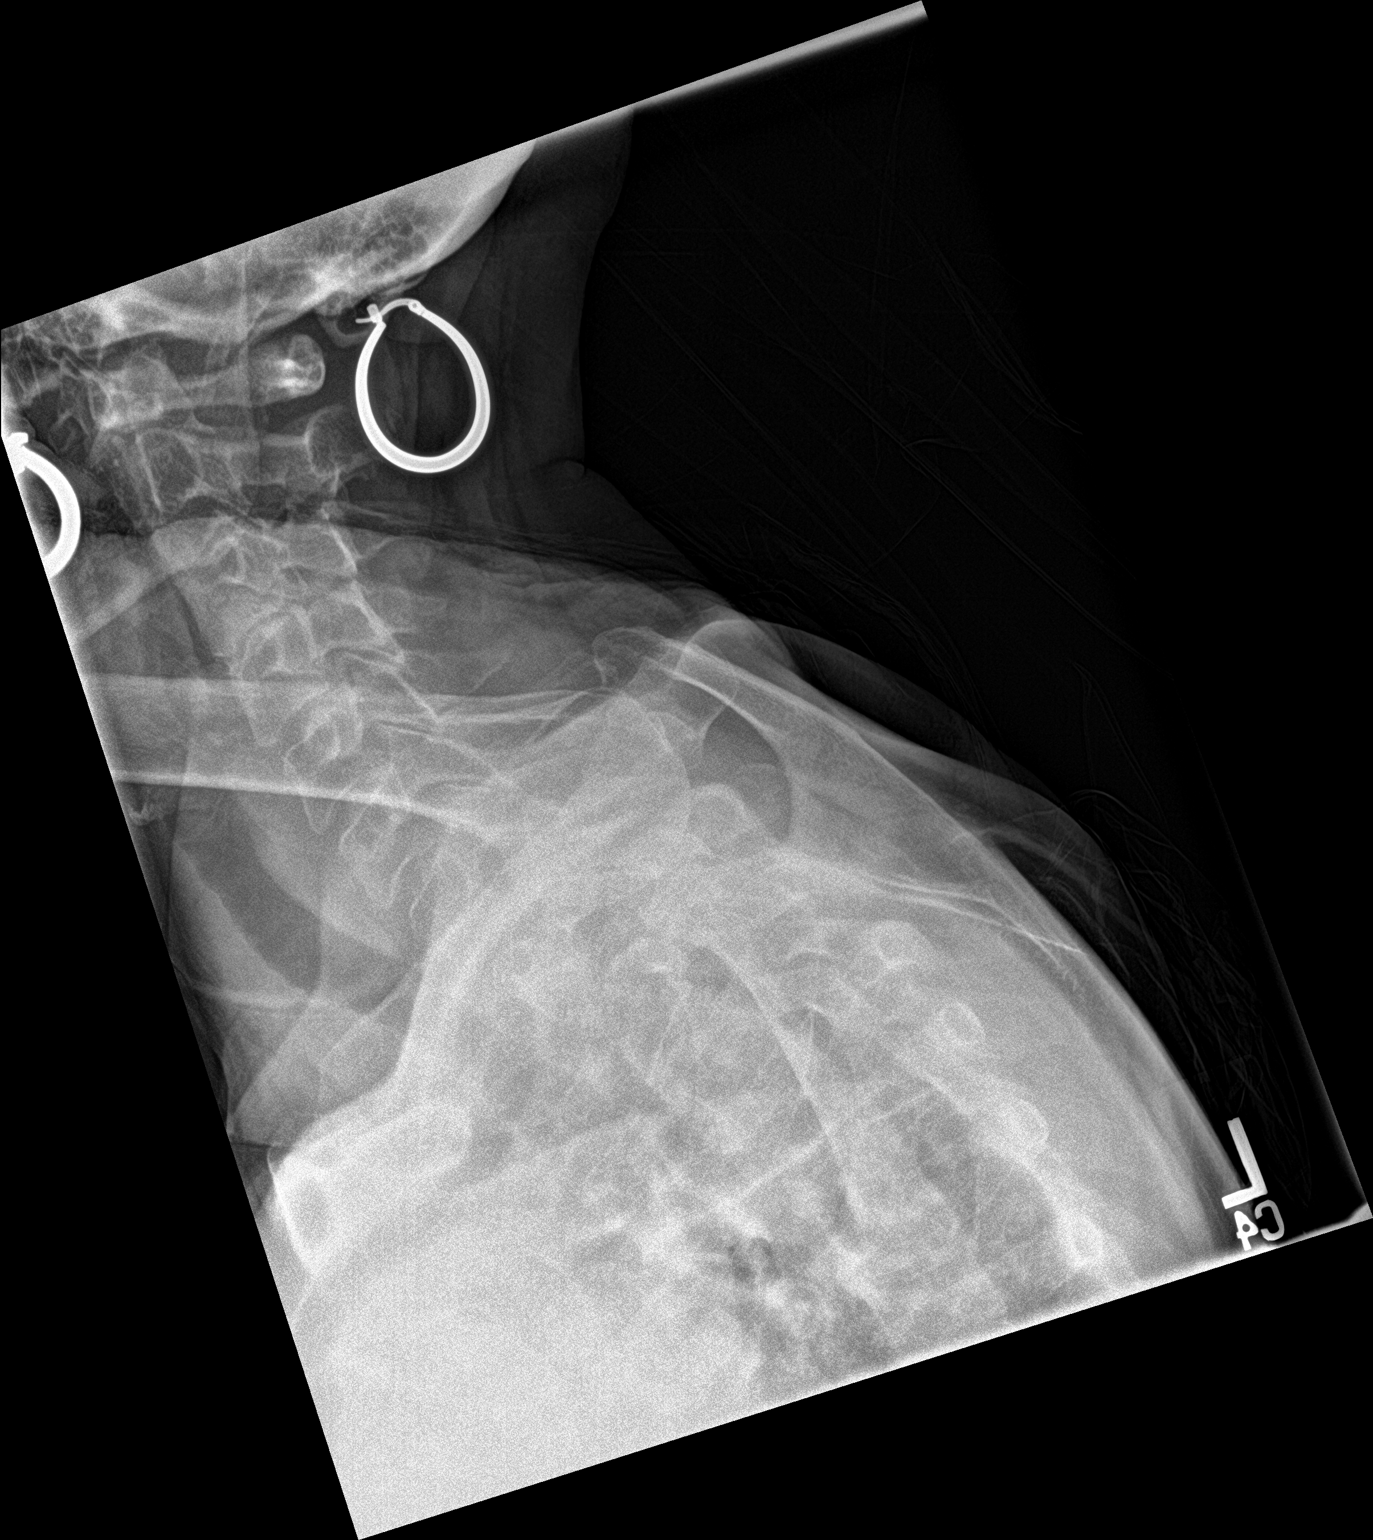

[3 of 3 positions shown; findings below may reference images not displayed]

FINDINGS: Thoracic segmentation appears normal. T12 and L1 compression
fractures redemonstrated. There are also up to moderate compression
fractures of T10, T8 and T7 which are new since 9970. Above that it
appears that the thoracic vertebral bodies are intact.
Cervicothoracic junction alignment is within normal limits.
Underlying thoracolumbar scoliosis is mild-to-moderate. Grossly
intact posterior ribs. Negative visible chest and upper abdominal
visceral contours.
IMPRESSION: 1. New compression fractures of T7, T8, and T10 since 9970. Thoracic
MRI or Nuclear Medicine Whole-body Bone Scan would best determine
acuity.
2. Chronic T12 and L1 compression fractures.

## 2020-05-04 ENCOUNTER — Ambulatory Visit (HOSPITAL_COMMUNITY): Payer: BC Managed Care – PPO | Admitting: Hematology

## 2020-05-05 IMAGING — CT CT BIOPSY
1 series · 15 of 28 positions shown, 19 images · non-contrast
Comparison: none

INDICATION: Remote history of breast cancer, now with Sathish monoclonal gammopathy.
Please perform CT-guided bone marrow biopsy for tissue diagnostic
purposes.

[Series 2: i-spiral 5.0 b40f · axial · 0.98mm/px · z∈[-114,-27]mm · 15 of 29 slices shown, 19 images]
[im 2/29  mediastinal]
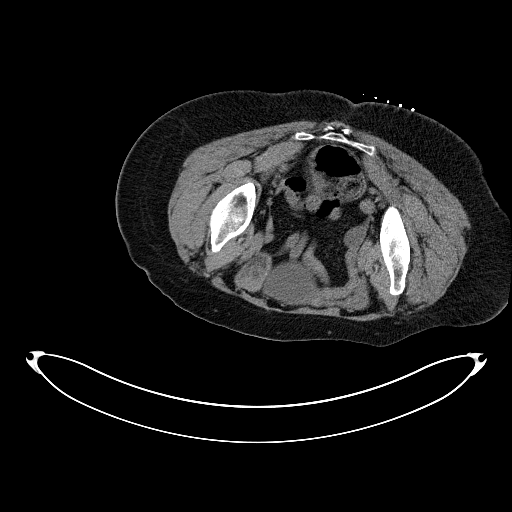
[im 2/29  lung]
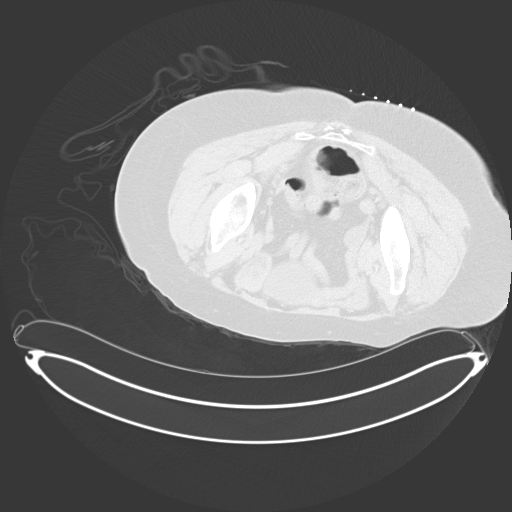
[im 4/29  lung]
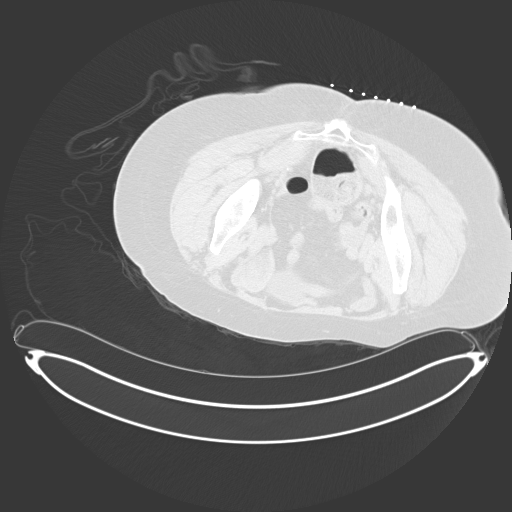
[im 6/29  lung]
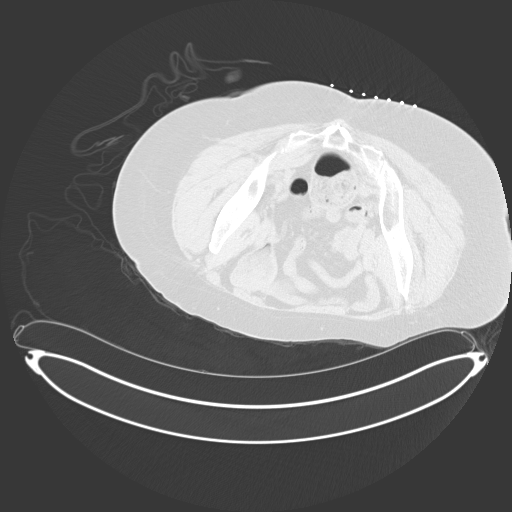
[im 8/29  lung]
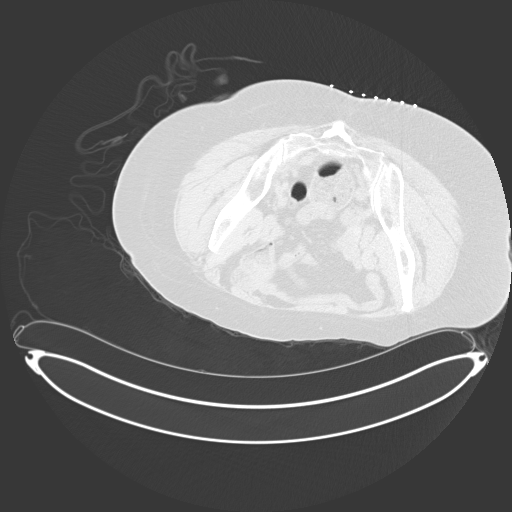
[im 9/29  mediastinal]
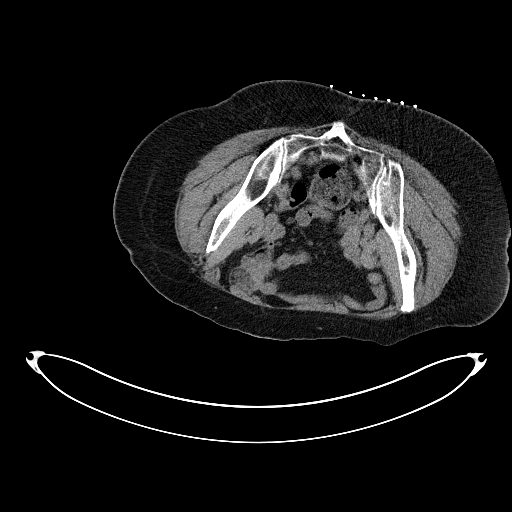
[im 9/29  lung]
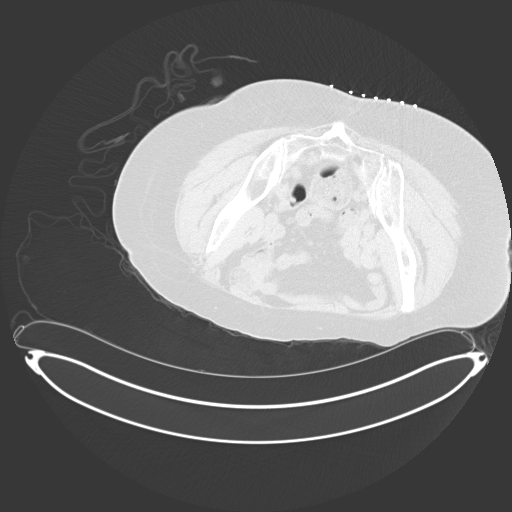
[im 11/29  lung]
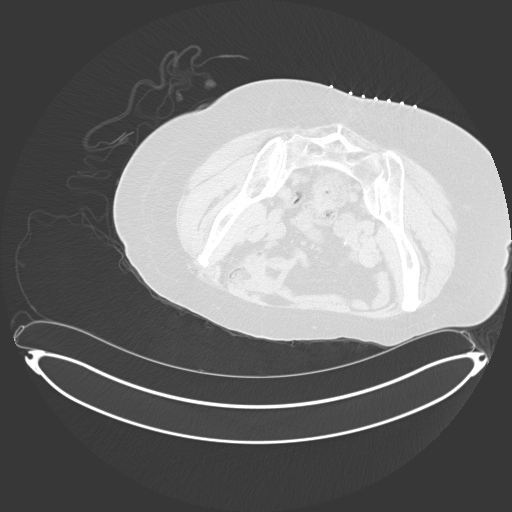
[im 13/29  lung]
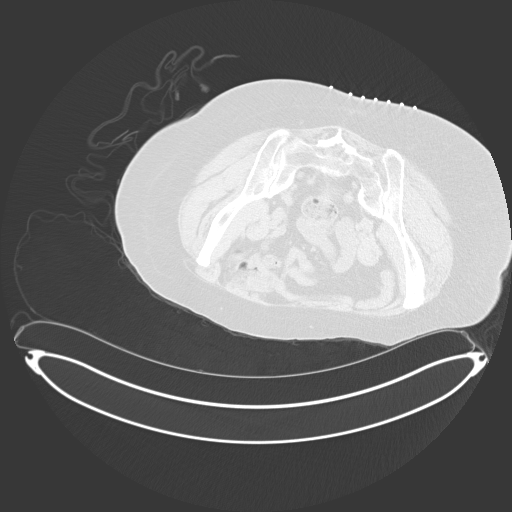
[im 15/29  lung]
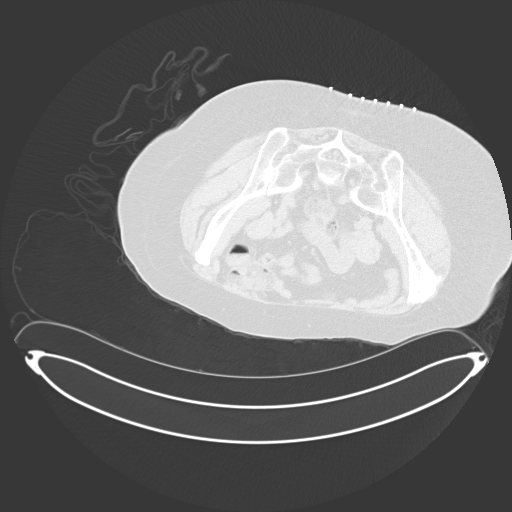
[im 16/29  mediastinal]
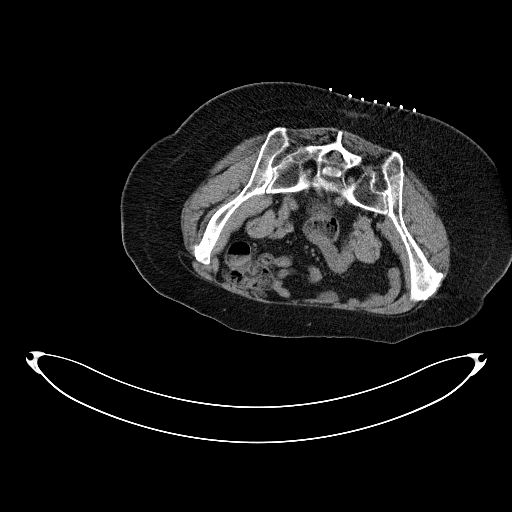
[im 16/29  lung]
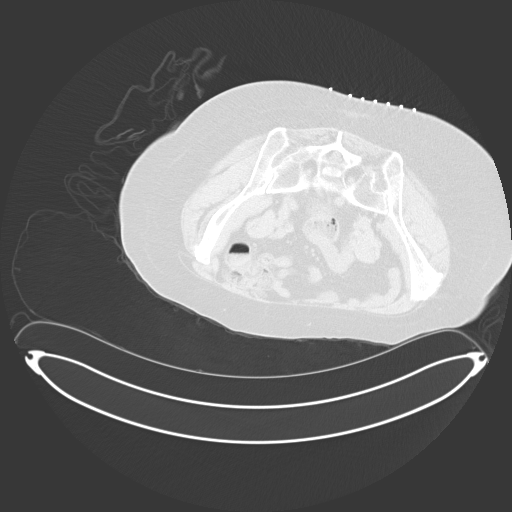
[im 18/29  lung]
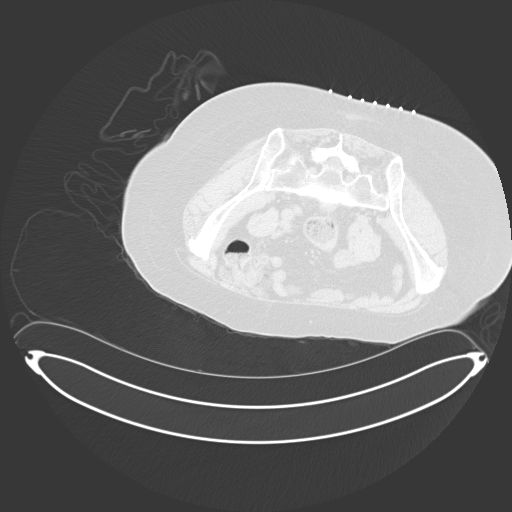
[im 20/29  lung]
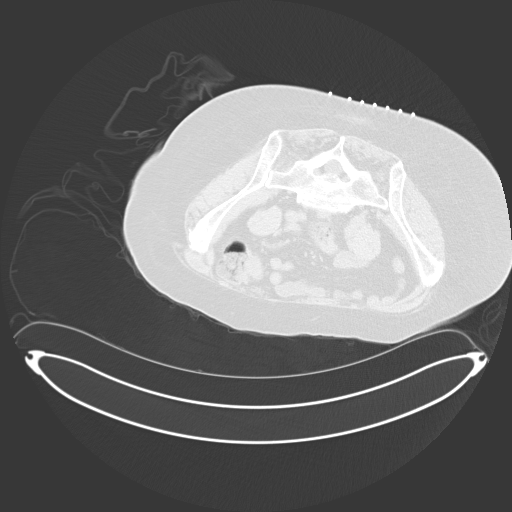
[im 21/29  lung]
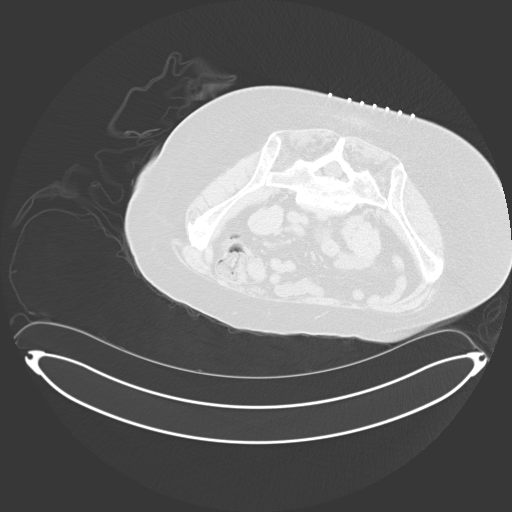
[im 23/29  mediastinal]
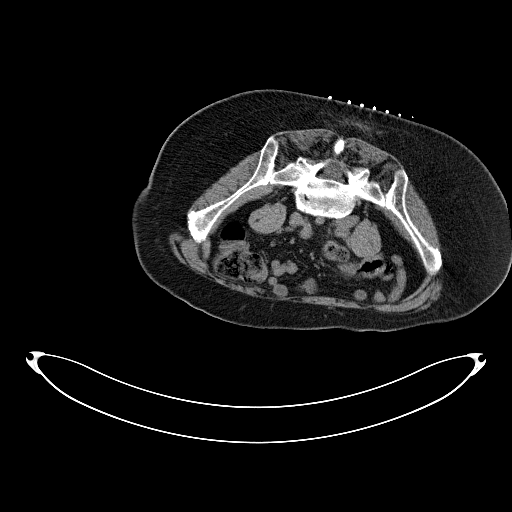
[im 23/29  lung]
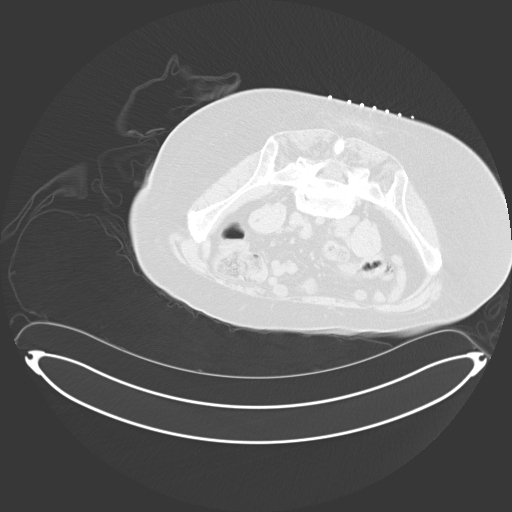
[im 25/29  lung]
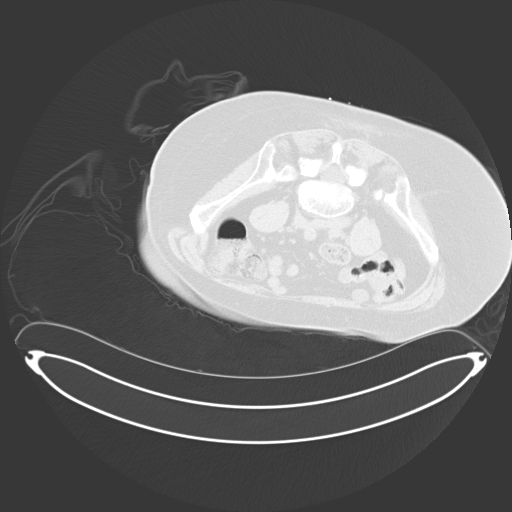
[im 27/29  lung]
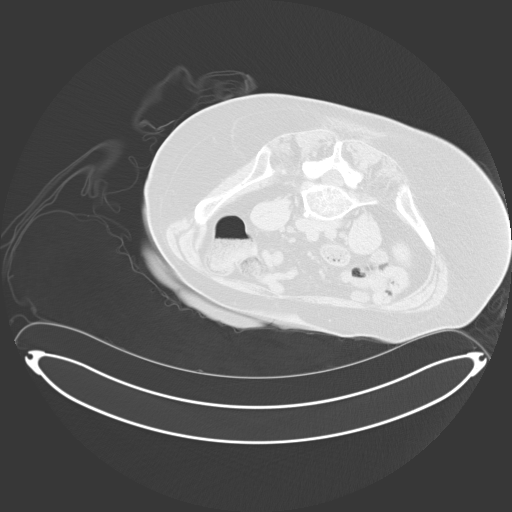

[15 of 28 positions shown; findings below may reference images not displayed]

EXAM:
CT-GUIDED BONE MARROW BIOPSY AND ASPIRATION

MEDICATIONS:
None

ANESTHESIA/SEDATION:
Fentanyl 100 mcg IV; Versed 2 mg IV

Sedation Time: 10 Minutes; The patient was continuously monitored
during the procedure by the interventional radiology nurse under my
direct supervision.

COMPLICATIONS:
None immediate.

PROCEDURE:
Informed consent was obtained from the patient following an
explanation of the procedure, risks, benefits and alternatives. The
patient understands, agrees and consents for the procedure. All
questions were addressed. A time out was performed prior to the
initiation of the procedure. The patient was positioned prone and
non-contrast localization CT was performed of the pelvis to
demonstrate the iliac marrow spaces. The operative site was prepped
and draped in the usual sterile fashion.

Under sterile conditions and local anesthesia, a 22 gauge spinal
needle was utilized for procedural planning. Next, an 11 gauge
coaxial bone biopsy needle was advanced into the left iliac marrow
space. Needle position was confirmed with CT imaging. Initially,
bone marrow aspiration was performed. Next, a bone marrow biopsy was
obtained with the 11 gauge outer bone marrow device. Samples were
prepared with the cytotechnologist and deemed adequate. The needle
was removed intact. Hemostasis was obtained with compression and a
dressing was placed. The patient tolerated the procedure well
without immediate post procedural complication.
IMPRESSION: Successful CT guided left iliac bone marrow aspiration and core
biopsy.

## 2020-05-05 IMAGING — CT CT BIOPSY AND ASPIRATION BONE MARROW
1 series · 15 of 28 positions shown, 19 images · non-contrast
Comparison: none

INDICATION: Remote history of breast cancer, now with Sathish monoclonal gammopathy.
Please perform CT-guided bone marrow biopsy for tissue diagnostic
purposes.

[Series 2: i-spiral 5.0 b40f · axial · 0.98mm/px · z∈[-114,-27]mm · 15 of 29 slices shown, 19 images]
[im 2/29  mediastinal]
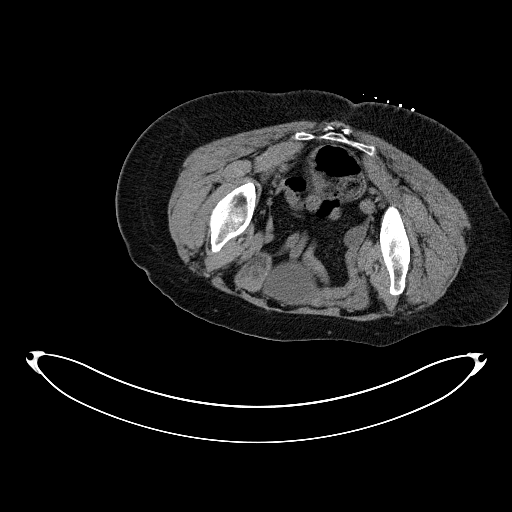
[im 2/29  lung]
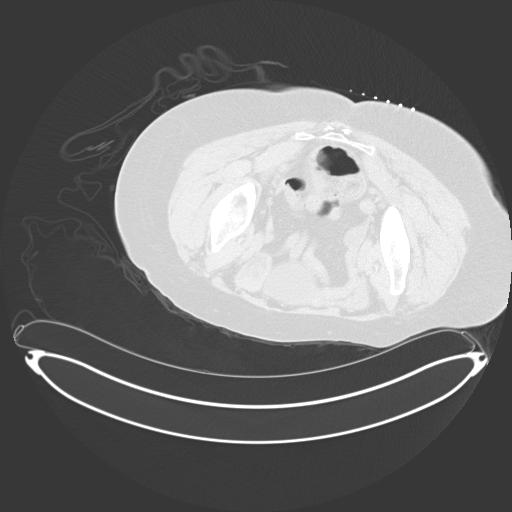
[im 4/29  lung]
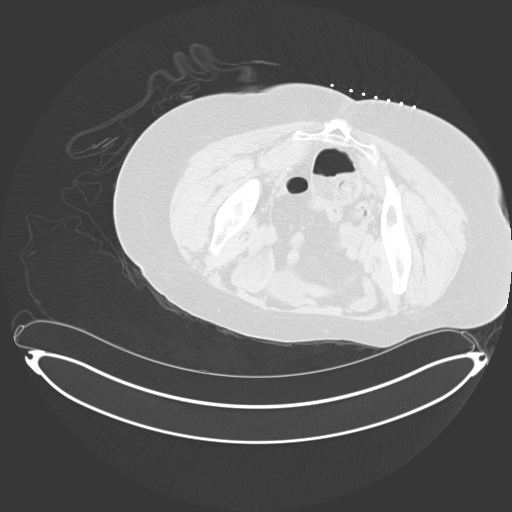
[im 6/29  lung]
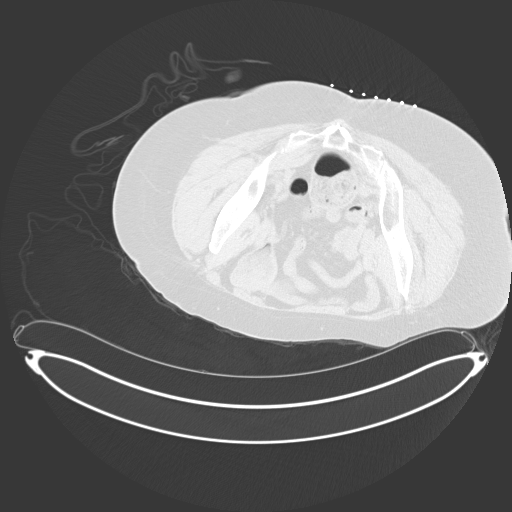
[im 8/29  lung]
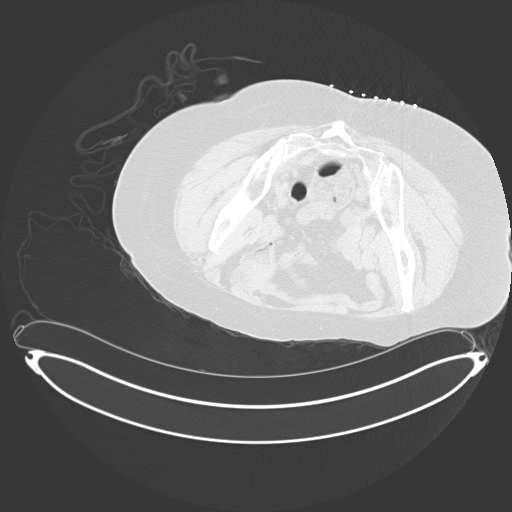
[im 9/29  mediastinal]
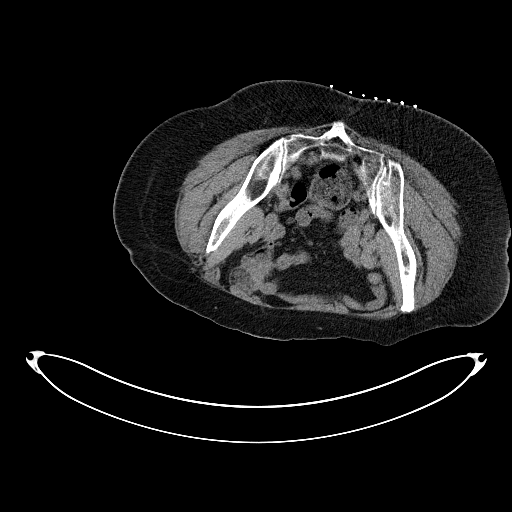
[im 9/29  lung]
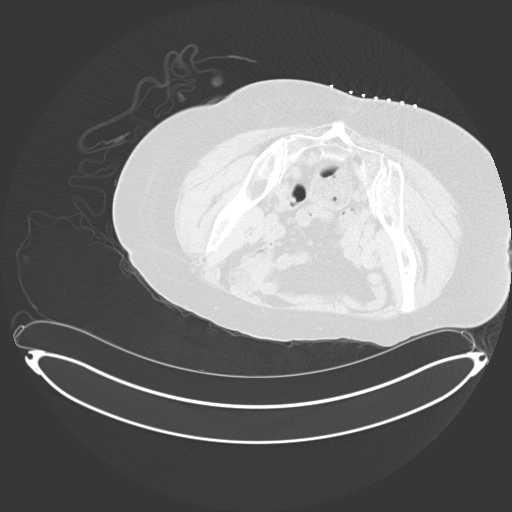
[im 11/29  lung]
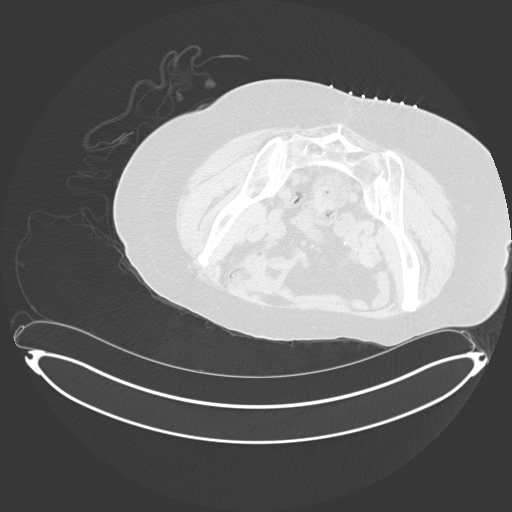
[im 13/29  lung]
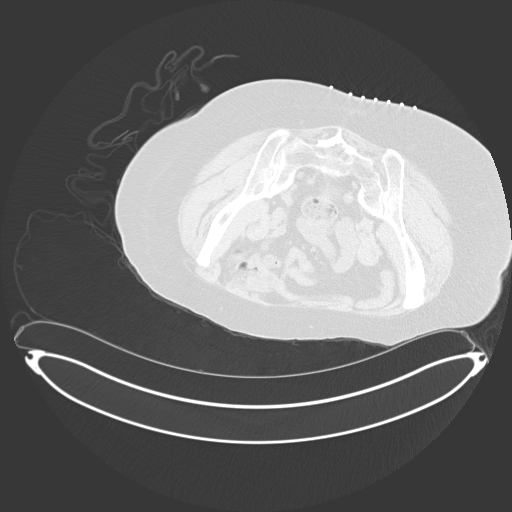
[im 15/29  lung]
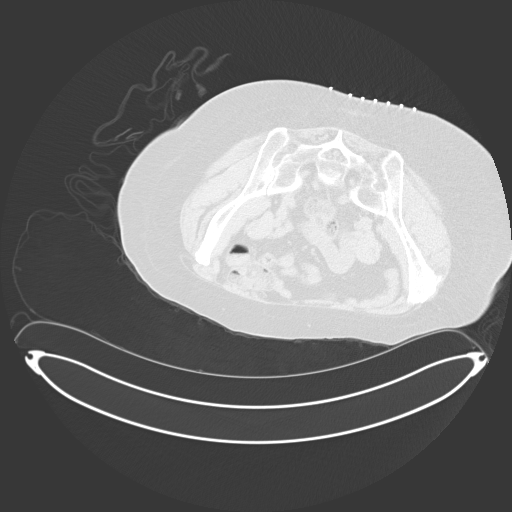
[im 16/29  mediastinal]
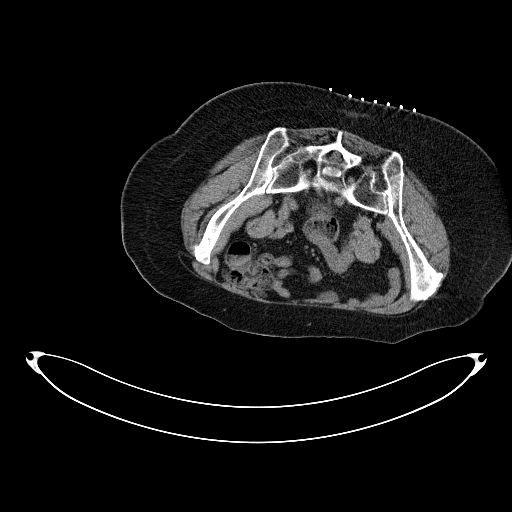
[im 16/29  lung]
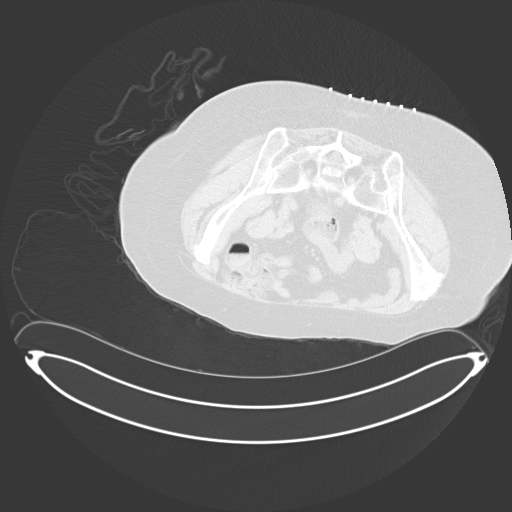
[im 18/29  lung]
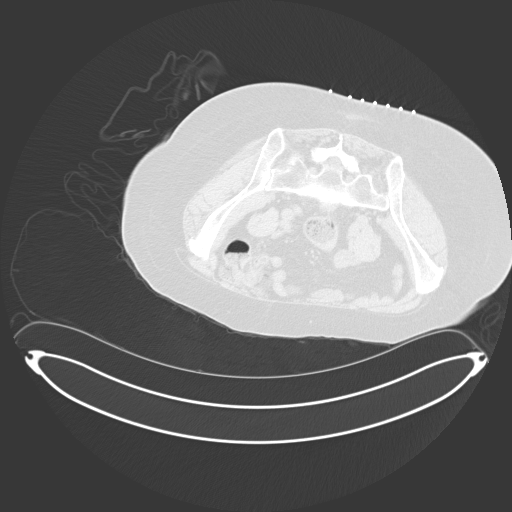
[im 20/29  lung]
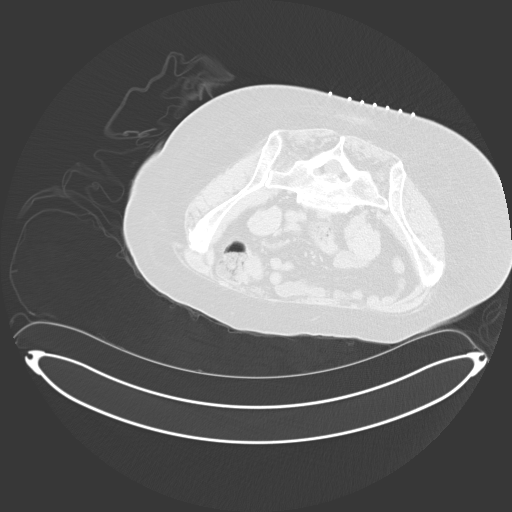
[im 21/29  lung]
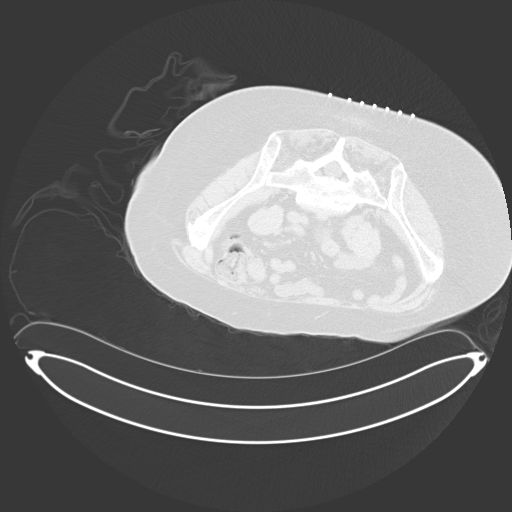
[im 23/29  mediastinal]
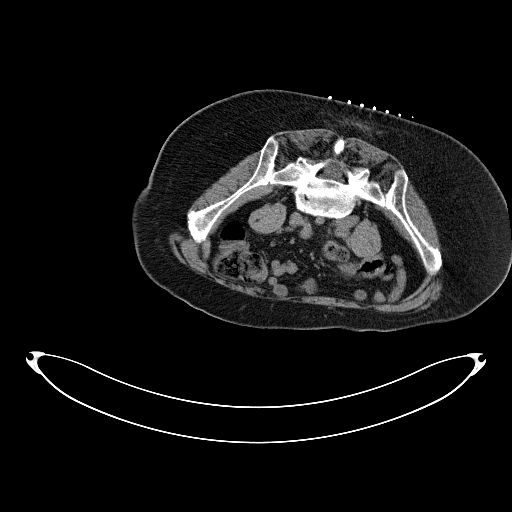
[im 23/29  lung]
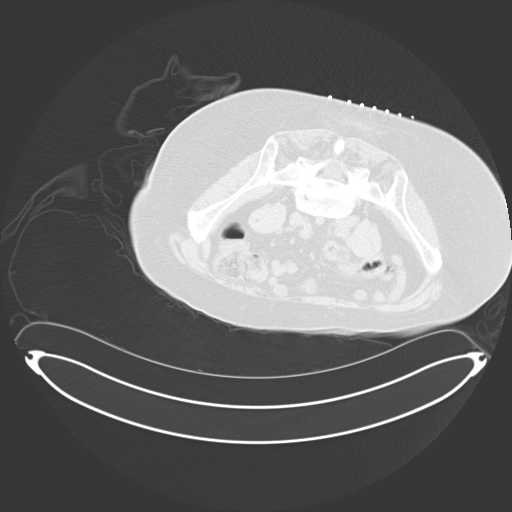
[im 25/29  lung]
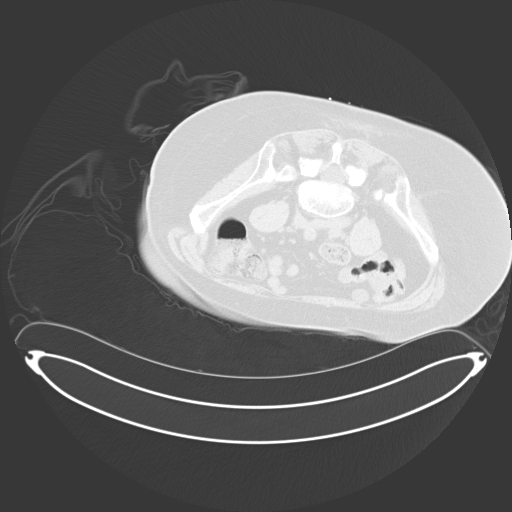
[im 27/29  lung]
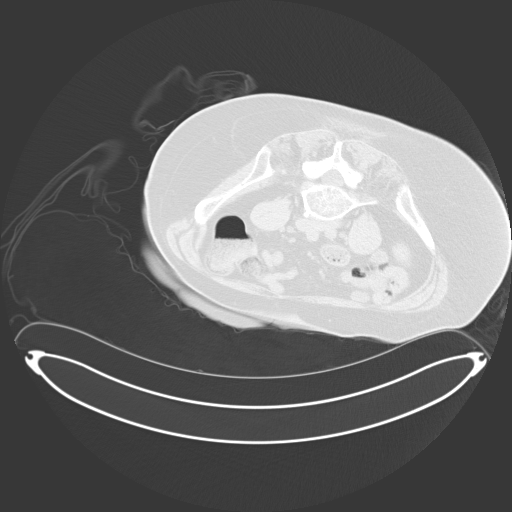

[15 of 28 positions shown; findings below may reference images not displayed]

EXAM:
CT-GUIDED BONE MARROW BIOPSY AND ASPIRATION

MEDICATIONS:
None

ANESTHESIA/SEDATION:
Fentanyl 100 mcg IV; Versed 2 mg IV

Sedation Time: 10 Minutes; The patient was continuously monitored
during the procedure by the interventional radiology nurse under my
direct supervision.

COMPLICATIONS:
None immediate.

PROCEDURE:
Informed consent was obtained from the patient following an
explanation of the procedure, risks, benefits and alternatives. The
patient understands, agrees and consents for the procedure. All
questions were addressed. A time out was performed prior to the
initiation of the procedure. The patient was positioned prone and
non-contrast localization CT was performed of the pelvis to
demonstrate the iliac marrow spaces. The operative site was prepped
and draped in the usual sterile fashion.

Under sterile conditions and local anesthesia, a 22 gauge spinal
needle was utilized for procedural planning. Next, an 11 gauge
coaxial bone biopsy needle was advanced into the left iliac marrow
space. Needle position was confirmed with CT imaging. Initially,
bone marrow aspiration was performed. Next, a bone marrow biopsy was
obtained with the 11 gauge outer bone marrow device. Samples were
prepared with the cytotechnologist and deemed adequate. The needle
was removed intact. Hemostasis was obtained with compression and a
dressing was placed. The patient tolerated the procedure well
without immediate post procedural complication.
IMPRESSION: Successful CT guided left iliac bone marrow aspiration and core
biopsy.

## 2020-05-10 ENCOUNTER — Encounter: Payer: Self-pay | Admitting: Internal Medicine

## 2020-05-20 ENCOUNTER — Other Ambulatory Visit (HOSPITAL_COMMUNITY): Payer: Self-pay

## 2020-05-23 ENCOUNTER — Inpatient Hospital Stay (HOSPITAL_COMMUNITY): Payer: BC Managed Care – PPO

## 2020-05-23 ENCOUNTER — Other Ambulatory Visit: Payer: Self-pay

## 2020-05-23 ENCOUNTER — Inpatient Hospital Stay (HOSPITAL_COMMUNITY): Payer: BC Managed Care – PPO | Attending: Hematology

## 2020-05-23 ENCOUNTER — Inpatient Hospital Stay (HOSPITAL_COMMUNITY): Payer: BC Managed Care – PPO | Attending: Oncology | Admitting: Hematology

## 2020-05-23 VITALS — BP 125/60 | HR 105 | Temp 97.5°F | Resp 16 | Wt 150.1 lb

## 2020-05-23 DIAGNOSIS — R110 Nausea: Secondary | ICD-10-CM | POA: Insufficient documentation

## 2020-05-23 DIAGNOSIS — Z79899 Other long term (current) drug therapy: Secondary | ICD-10-CM | POA: Insufficient documentation

## 2020-05-23 DIAGNOSIS — Z8249 Family history of ischemic heart disease and other diseases of the circulatory system: Secondary | ICD-10-CM | POA: Diagnosis not present

## 2020-05-23 DIAGNOSIS — R109 Unspecified abdominal pain: Secondary | ICD-10-CM | POA: Insufficient documentation

## 2020-05-23 DIAGNOSIS — E876 Hypokalemia: Secondary | ICD-10-CM | POA: Diagnosis not present

## 2020-05-23 DIAGNOSIS — Z800 Family history of malignant neoplasm of digestive organs: Secondary | ICD-10-CM | POA: Insufficient documentation

## 2020-05-23 DIAGNOSIS — Z833 Family history of diabetes mellitus: Secondary | ICD-10-CM | POA: Diagnosis not present

## 2020-05-23 DIAGNOSIS — M549 Dorsalgia, unspecified: Secondary | ICD-10-CM | POA: Insufficient documentation

## 2020-05-23 DIAGNOSIS — G629 Polyneuropathy, unspecified: Secondary | ICD-10-CM | POA: Insufficient documentation

## 2020-05-23 DIAGNOSIS — R630 Anorexia: Secondary | ICD-10-CM | POA: Insufficient documentation

## 2020-05-23 DIAGNOSIS — Z809 Family history of malignant neoplasm, unspecified: Secondary | ICD-10-CM | POA: Diagnosis not present

## 2020-05-23 DIAGNOSIS — Z853 Personal history of malignant neoplasm of breast: Secondary | ICD-10-CM | POA: Insufficient documentation

## 2020-05-23 DIAGNOSIS — C9000 Multiple myeloma not having achieved remission: Secondary | ICD-10-CM | POA: Insufficient documentation

## 2020-05-23 DIAGNOSIS — K5900 Constipation, unspecified: Secondary | ICD-10-CM | POA: Insufficient documentation

## 2020-05-23 DIAGNOSIS — Z818 Family history of other mental and behavioral disorders: Secondary | ICD-10-CM | POA: Insufficient documentation

## 2020-05-23 DIAGNOSIS — G479 Sleep disorder, unspecified: Secondary | ICD-10-CM | POA: Insufficient documentation

## 2020-05-23 DIAGNOSIS — M2550 Pain in unspecified joint: Secondary | ICD-10-CM | POA: Insufficient documentation

## 2020-05-23 DIAGNOSIS — Z8379 Family history of other diseases of the digestive system: Secondary | ICD-10-CM | POA: Diagnosis not present

## 2020-05-23 DIAGNOSIS — R102 Pelvic and perineal pain: Secondary | ICD-10-CM | POA: Insufficient documentation

## 2020-05-23 DIAGNOSIS — R5383 Other fatigue: Secondary | ICD-10-CM | POA: Insufficient documentation

## 2020-05-23 LAB — COMPREHENSIVE METABOLIC PANEL
ALT: 20 U/L (ref 0–44)
AST: 26 U/L (ref 15–41)
Albumin: 3.6 g/dL (ref 3.5–5.0)
Alkaline Phosphatase: 185 U/L — ABNORMAL HIGH (ref 38–126)
Anion gap: 9 (ref 5–15)
BUN: 9 mg/dL (ref 8–23)
CO2: 24 mmol/L (ref 22–32)
Calcium: 9.3 mg/dL (ref 8.9–10.3)
Chloride: 105 mmol/L (ref 98–111)
Creatinine, Ser: 0.71 mg/dL (ref 0.44–1.00)
GFR calc Af Amer: >60 mL/min (ref >60)
GFR calc non Af Amer: >60 mL/min (ref >60)
Glucose, Bld: 122 mg/dL — ABNORMAL HIGH (ref 70–99)
Potassium: 4.5 mmol/L (ref 3.5–5.1)
Sodium: 138 mmol/L (ref 135–145)
Total Bilirubin: 0.5 mg/dL (ref 0.3–1.2)
Total Protein: 6.3 g/dL — ABNORMAL LOW (ref 6.5–8.1)

## 2020-05-23 LAB — CBC WITH DIFFERENTIAL/PLATELET
Abs Immature Granulocytes: 0.02 10*3/uL (ref 0.00–0.07)
Basophils Absolute: 0.1 10*3/uL (ref 0.0–0.1)
Basophils Relative: 1 %
Eosinophils Absolute: 0 10*3/uL (ref 0.0–0.5)
Eosinophils Relative: 0 %
HCT: 32.9 % — ABNORMAL LOW (ref 36.0–46.0)
Hemoglobin: 10.4 g/dL — ABNORMAL LOW (ref 12.0–15.0)
Immature Granulocytes: 0 %
Lymphocytes Relative: 23 %
Lymphs Abs: 1.5 10*3/uL (ref 0.7–4.0)
MCH: 32.9 pg (ref 26.0–34.0)
MCHC: 31.6 g/dL (ref 30.0–36.0)
MCV: 104.1 fL — ABNORMAL HIGH (ref 80.0–100.0)
Monocytes Absolute: 1.3 10*3/uL — ABNORMAL HIGH (ref 0.1–1.0)
Monocytes Relative: 21 %
Neutro Abs: 3.6 10*3/uL (ref 1.7–7.7)
Neutrophils Relative %: 55 %
Platelets: 281 10*3/uL (ref 150–400)
RBC: 3.16 MIL/uL — ABNORMAL LOW (ref 3.87–5.11)
RDW: 15 % (ref 11.5–15.5)
WBC: 6.4 10*3/uL (ref 4.0–10.5)
nRBC: 0 % (ref 0.0–0.2)

## 2020-05-23 LAB — MAGNESIUM: Magnesium: 1.8 mg/dL (ref 1.7–2.4)

## 2020-05-23 NOTE — Progress Notes (Signed)
Stephanie Galvan, [place] [address]   CLINIC:  Medical Oncology/Hematology  PCP:  [name], MD [place] / [place] [address]  [phone]  REASON FOR VISIT:  Follow-up for multiple myeloma  PRIOR THERAPY: Bortezomib x 6 cycles from 08/19/2019 to 12/15/2019  CURRENT THERAPY: Revlimid 2 weeks on, 1 week off  INTERVAL HISTORY:  [name], a 64 y.o. female, returns for routine follow-up for her multiple myeloma. Stephanie Galvan was last seen on 03/28/2020.  Today she is accompanied by her granddaughter. She reports being fatigued since the transplant at [facility] on 8/20 and she has been doing good since coming back home on 9/8. Her energy levels have been improving since the transplant. She denies having numbness or tingling, F/C, night sweats, cough, or diarrhea. Her appetite is okay, though she is just not eating as much, but it is also improving. She notices a funny feeling in her lower abdomen which irritates her and causes mild nausea, but she denies vomiting or burning with urination or frequency.  She will go to [facility] for follow up on 10/7.   REVIEW OF SYSTEMS:  Review of Systems  Constitutional: Positive for appetite change (moderately decreased) and fatigue (severe). Negative for chills, diaphoresis and fever.  Respiratory: Negative for cough.   Gastrointestinal: Positive for nausea (occasional). Negative for diarrhea and vomiting.  Genitourinary: Negative for dysuria and frequency.   Neurological: Negative for numbness.  Psychiatric/Behavioral: Positive for sleep disturbance.  All other systems reviewed and are negative.   PAST MEDICAL/SURGICAL HISTORY:  Past Medical History:  Diagnosis Date  . Breast cancer ([place]) 2006   left breast  . Breast mass, right   . Compression fracture of cervical spine ([place]) 06/18/2019   recent Chest xray stating had fx.  . High cholesterol   . History of bladder infections   . Hypertension    . Rash Aug 2013   Past Surgical History:  Procedure Laterality Date  . ABDOMINAL HYSTERECTOMY     just uterus removed  . BREAST BIOPSY    . BREAST LUMPECTOMY WITH RADIOACTIVE SEED LOCALIZATION Right 07/24/2018   Procedure: RIGHT BREAST LUMPECTOMY WITH RADIOACTIVE SEED LOCALIZATION;  Surgeon: Coralie Keens, MD;  Location: [facility];  Service: General;  Laterality: Right;  . MASTECTOMY  5/06   left  . PARTIAL HYSTERECTOMY    . PORT-A-CATH REMOVAL    . PORTACATH PLACEMENT      SOCIAL HISTORY:  Social History   Socioeconomic History  . Marital status: Married    Spouse name: Not on file  . Number of children: 2  . Years of education: Not on file  . Highest education level: Not on file  Occupational History    Employer: [employer]  Tobacco Use  . Smoking status: Never Smoker  . Smokeless tobacco: Never Used  Vaping Use  . Vaping Use: Never used  Substance and Sexual Activity  . Alcohol use: No  . Drug use: No  . Sexual activity: Yes    Birth control/protection: Surgical  Other Topics Concern  . Not on file  Social History Narrative  . Not on file   Social Determinants of Health   Financial Resource Strain:   . Difficulty of Paying Living Expenses: Not on file  Food Insecurity:   . Worried About Charity fundraiser in the Last Year: Not on file  . Ran Out of Food in the Last Year: Not on file  Transportation Needs:   . Film/video editor (Medical): Not on file  . Lack of Transportation (Non-Medical): Not on file  Physical Activity:   . Days of Exercise per Week: Not on file  . Minutes of Exercise per Session: Not on file  Stress:   . Feeling of Stress : Not on file  Social Connections:   . Frequency of Communication with Friends and Family: Not on file  . Frequency of Social Gatherings with Friends and Family: Not on file  . Attends Religious Services: Not on file  . Active Member of Clubs or Organizations: Not on file  . Attends  Archivist Meetings: Not on file  . Marital Status: Not on file  Intimate Partner Violence:   . Fear of Current or Ex-Partner: Not on file  . Emotionally Abused: Not on file  . Physically Abused: Not on file  . Sexually Abused: Not on file    FAMILY HISTORY:  Family History  Problem Relation Age of Onset  . Dementia Mother   . Stomach cancer Father   . Cancer Brother   . Cancer Brother   . Diabetes Brother   . Colitis Daughter   . Hypertension Daughter   . Hypertension Daughter     CURRENT MEDICATIONS:  Current Outpatient Medications  Medication Sig Dispense Refill  . acyclovir (ZOVIRAX) 400 MG tablet Take 1 tablet (400 mg total) by mouth 2 (two) times daily. 180 tablet 1  . calcium carbonate (TUMS - DOSED IN MG ELEMENTAL CALCIUM) 500 MG chewable tablet Chew 2 tablets by mouth 2 (two) times daily.    . ergocalciferol (VITAMIN D2) 1.25 MG (50000 UT) capsule Take 1 capsule (50,000 Units total) by mouth once a week. 12 capsule 1  . fluticasone (FLOVENT HFA) 220 MCG/ACT inhaler Inhale into the lungs.    Marland Kitchen lenalidomide (REVLIMID) 25 MG capsule Take one capsule daily on days 1-14 every 21 days. 14 capsule 0  . LORazepam (ATIVAN) 0.5 MG tablet Take by mouth.    Marland Kitchen omeprazole (PRILOSEC) 40 MG capsule Take by mouth.    . oxyCODONE (OXY IR/ROXICODONE) 5 MG immediate release tablet Take by mouth.    . potassium chloride (KLOR-CON) 10 MEQ tablet Take 2 tablets (20 mEq total) by mouth 3 (three) times daily. 180 tablet 2  . prochlorperazine (COMPAZINE) 10 MG tablet Take 1 tablet (10 mg total) by mouth every 6 (six) hours as needed for nausea or vomiting. 45 tablet 2  . simethicone (MYLICON) 80 MG chewable tablet Chew by mouth.    Marland Kitchen ZARXIO 480 MCG/0.8ML SOSY injection     . ondansetron (ZOFRAN) 8 MG tablet Take by mouth. (Patient not taking: Reported on 05/23/2020)     No current facility-administered medications for this visit.    ALLERGIES:  No Known Allergies  PHYSICAL EXAM:    Performance status (ECOG): 1 - Symptomatic but completely ambulatory  Vitals:   05/23/20 1304  BP: 125/60  Pulse: (!) 105  Resp: 16  Temp: (!) 97.5 F (36.4 C)  SpO2: 100%   Wt Readings from Last 3 Encounters:  05/23/20 150 lb 1.6 oz (68.1 kg)  03/28/20 157 lb 1.6 oz (71.3 kg)  02/29/20 154 lb (69.9 kg)   Physical Exam Vitals reviewed.  Constitutional:      Appearance: Normal appearance.  Cardiovascular:     Rate and Rhythm: Normal rate and regular rhythm.     Pulses: Normal pulses.     Heart sounds: Normal heart sounds.  Pulmonary:     Effort: Pulmonary effort is normal.     Breath sounds: Normal breath sounds.  Abdominal:     Palpations: Abdomen is soft. There is no hepatomegaly or mass.     Tenderness: There is no abdominal tenderness.     Hernia: No hernia is present.  Neurological:     General: No focal deficit present.     Mental Status: She is alert and oriented to person, place, and time.  Psychiatric:        Mood and Affect: Mood normal.        Behavior: Behavior normal.     LABORATORY DATA:  I have reviewed the labs as listed.  CBC Latest Ref Rng & Units 05/23/2020 03/28/2020 02/29/2020  WBC 4.0 - 10.5 K/uL 6.4 3.4(L) 2.4(L)  Hemoglobin 12.0 - 15.0 g/dL 10.4(L) 11.6(L) 11.5(L)  Hematocrit 36 - 46 % 32.9(L) 35.4(L) 35.8(L)  Platelets 150 - 400 K/uL 281 213 177   CMP Latest Ref Rng & Units 05/23/2020 03/28/2020 02/29/2020  Glucose 70 - 99 mg/dL 122(H) 94 100(H)  BUN 8 - 23 mg/dL 9 13 10   Creatinine 0.44 - 1.00 mg/dL 0.71 0.73 0.74  Sodium 135 - 145 mmol/L 138 141 140  Potassium 3.5 - 5.1 mmol/L 4.5 3.8 3.8  Chloride 98 - 111 mmol/L 105 104 106  CO2 22 - 32 mmol/L 24 28 26   Calcium 8.9 - 10.3 mg/dL 9.3 9.0 8.5(L)  Total Protein 6.5 - 8.1 g/dL 6.3(L) 6.8 6.4(L)  Total Bilirubin 0.3 - 1.2 mg/dL 0.5 0.6 0.5  Alkaline Phos 38 - 126 U/L 185(H) 149(H) 133(H)  AST 15 - 41 U/L 26 33 18  ALT 0 - 44 U/L 20 41 39      Component Value Date/Time   RBC 3.16 (L)  05/23/2020 1241   MCV 104.1 (H) 05/23/2020 1241   MCH 32.9 05/23/2020 1241   MCHC 31.6 05/23/2020 1241   RDW 15.0 05/23/2020 1241   LYMPHSABS 1.5 05/23/2020 1241   MONOABS 1.3 (H) 05/23/2020 1241   EOSABS 0.0 05/23/2020 1241   BASOSABS 0.1 05/23/2020 1241   Lab Results  Component Value Date   LDH 118 02/29/2020   LDH 145 01/06/2020   LDH 130 12/15/2019    DIAGNOSTIC IMAGING:  I have independently reviewed the scans and discussed with the patient. No results found.   ASSESSMENT:  1. 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 Revlimid started on 10/06/2019 due to delay. -Velcade held since 12/15/2019 due to gastric symptoms, which have resolved completely. -She was evaluated by Dr. Jannifer Franklin. Recommended to have immediate bone marrow transplant. -She has compression fractures of T8, T10 and T12. Also L1 and L3 compression fractures. -Revlimid and dexamethasone continued until 03/22/2020. 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 IgA lambda plasma cell myeloma: -She is day +24 autotransplant.  She came back home on 05/18/2020. -She has occasional nausea and vomited twice since last Wednesday.  She is using Compazine, Zofran and Ativan. -She has some fatigue which is improving. -Reviewed CBC from today which showed white count 6.4 and platelet count 281 with hemoglobin 10.4.  She has an appointment at [facility] on 06/16/2020. -We will plan to repeat her blood count in 2 weeks.  2. Back pain/abdominal pain: -She reported some lower abdominal pain.  She also has some back pain. -Hydrocodone helps with her pain  better than oxycodone. -She also reported some queasiness and difficulty falling asleep.  She will take Ativan 0.5 mg 2 tablets during the daytime as needed and at bedtime.  3. Bone protection: -We will plan to start Zometa couple of months after  transplant.  4. Hypokalemia: -Potassium today is 4.3.  I told her to cut back on potassium to 20 mEq twice daily.  She was taking 3 times a day.  5. ID prophylaxis: -Continue acyclovir twice daily.  Continue aspirin 81 mg daily.  Orders placed this encounter:  Orders Placed This Encounter  Procedures  . DG Bone Density  . Magnesium  . CBC with Differential/Platelet  . Comprehensive metabolic panel  . Magnesium     Derek Jack, MD [place] [phone]   I, Milinda Antis, am acting as a scribe for Dr. Sanda Linger.  I, Derek Jack MD, have reviewed the above documentation for accuracy and completeness, and I agree with the above.

## 2020-05-23 NOTE — Patient Instructions (Signed)
[place] at [facility] Discharge Instructions  You were seen today by Dr. Delton Coombes. He went over your recent results. Take 2 tablets of potassium twice daily. You will be prescribed Ativan to take at bedtime an needed. You will not receive your injections this week. Dr. Delton Coombes will see you back in 2 weeks for labs and follow up.   Thank you for choosing [place] at [facility] to provide your oncology and hematology care.  To afford each patient quality time with our provider, please arrive at least 15 minutes before your scheduled appointment time.   If you have a lab appointment with the [facility] please come in thru the Main Entrance and check in at the main information desk  You need to re-schedule your appointment should you arrive 10 or more minutes late.  We strive to give you quality time with our providers, and arriving late affects you and other patients whose appointments are after yours.  Also, if you no show three or more times for appointments you may be dismissed from the clinic at the providers discretion.     Again, thank you for choosing [facility].  Our hope is that these requests will decrease the amount of time that you wait before being seen by our physicians.       _____________________________________________________________  Should you have questions after your visit to [facility], please contact our office at [phone] between the hours of 8:00 a.m. and 4:30 p.m.  Voicemails left after 4:00 p.m. will not be returned until the following business day.  For prescription refill requests, have your pharmacy contact our office and allow 72 hours.    Cancer Center Support Programs:   > Cancer Support Group  2nd Tuesday of the month 1pm-2pm, Journey Room

## 2020-05-30 ENCOUNTER — Other Ambulatory Visit: Payer: Self-pay

## 2020-05-30 ENCOUNTER — Inpatient Hospital Stay (HOSPITAL_COMMUNITY): Payer: BC Managed Care – PPO | Attending: Hematology

## 2020-05-30 DIAGNOSIS — C9000 Multiple myeloma not having achieved remission: Secondary | ICD-10-CM

## 2020-05-30 LAB — CBC WITH DIFFERENTIAL/PLATELET
Abs Immature Granulocytes: 0.01 10*3/uL (ref 0.00–0.07)
Basophils Absolute: 0.1 10*3/uL (ref 0.0–0.1)
Basophils Relative: 1 %
Eosinophils Absolute: 0.1 10*3/uL (ref 0.0–0.5)
Eosinophils Relative: 2 %
HCT: 34.8 % — ABNORMAL LOW (ref 36.0–46.0)
Hemoglobin: 11 g/dL — ABNORMAL LOW (ref 12.0–15.0)
Immature Granulocytes: 0 %
Lymphocytes Relative: 31 %
Lymphs Abs: 1.3 10*3/uL (ref 0.7–4.0)
MCH: 32.6 pg (ref 26.0–34.0)
MCHC: 31.6 g/dL (ref 30.0–36.0)
MCV: 103.3 fL — ABNORMAL HIGH (ref 80.0–100.0)
Monocytes Absolute: 0.9 10*3/uL (ref 0.1–1.0)
Monocytes Relative: 21 %
Neutro Abs: 1.9 10*3/uL (ref 1.7–7.7)
Neutrophils Relative %: 45 %
Platelets: 336 10*3/uL (ref 150–400)
RBC: 3.37 MIL/uL — ABNORMAL LOW (ref 3.87–5.11)
RDW: 14.6 % (ref 11.5–15.5)
WBC: 4.3 10*3/uL (ref 4.0–10.5)
nRBC: 0 % (ref 0.0–0.2)

## 2020-05-30 LAB — COMPREHENSIVE METABOLIC PANEL
ALT: 17 U/L (ref 0–44)
AST: 26 U/L (ref 15–41)
Albumin: 3.5 g/dL (ref 3.5–5.0)
Alkaline Phosphatase: 171 U/L — ABNORMAL HIGH (ref 38–126)
Anion gap: 8 (ref 5–15)
BUN: 8 mg/dL (ref 8–23)
CO2: 26 mmol/L (ref 22–32)
Calcium: 9.6 mg/dL (ref 8.9–10.3)
Chloride: 104 mmol/L (ref 98–111)
Creatinine, Ser: 0.75 mg/dL (ref 0.44–1.00)
GFR calc Af Amer: >60 mL/min (ref >60)
GFR calc non Af Amer: >60 mL/min (ref >60)
Glucose, Bld: 125 mg/dL — ABNORMAL HIGH (ref 70–99)
Potassium: 3.9 mmol/L (ref 3.5–5.1)
Sodium: 138 mmol/L (ref 135–145)
Total Bilirubin: 0.6 mg/dL (ref 0.3–1.2)
Total Protein: 6.7 g/dL (ref 6.5–8.1)

## 2020-05-30 LAB — MAGNESIUM: Magnesium: 1.9 mg/dL (ref 1.7–2.4)

## 2020-06-06 ENCOUNTER — Inpatient Hospital Stay (HOSPITAL_COMMUNITY): Payer: BC Managed Care – PPO | Admitting: Hematology

## 2020-06-06 ENCOUNTER — Other Ambulatory Visit: Payer: Self-pay

## 2020-06-06 ENCOUNTER — Inpatient Hospital Stay (HOSPITAL_COMMUNITY): Payer: BC Managed Care – PPO

## 2020-06-06 ENCOUNTER — Encounter (HOSPITAL_COMMUNITY): Payer: Self-pay | Admitting: Hematology

## 2020-06-06 VITALS — BP 138/87 | HR 96 | Temp 96.8°F | Resp 18 | Wt 149.0 lb

## 2020-06-06 DIAGNOSIS — Z79899 Other long term (current) drug therapy: Secondary | ICD-10-CM | POA: Diagnosis not present

## 2020-06-06 DIAGNOSIS — C9000 Multiple myeloma not having achieved remission: Secondary | ICD-10-CM | POA: Diagnosis present

## 2020-06-06 DIAGNOSIS — E876 Hypokalemia: Secondary | ICD-10-CM | POA: Diagnosis not present

## 2020-06-06 DIAGNOSIS — K5900 Constipation, unspecified: Secondary | ICD-10-CM | POA: Diagnosis not present

## 2020-06-06 DIAGNOSIS — Z800 Family history of malignant neoplasm of digestive organs: Secondary | ICD-10-CM | POA: Diagnosis not present

## 2020-06-06 DIAGNOSIS — Z8379 Family history of other diseases of the digestive system: Secondary | ICD-10-CM | POA: Diagnosis not present

## 2020-06-06 DIAGNOSIS — Z818 Family history of other mental and behavioral disorders: Secondary | ICD-10-CM | POA: Diagnosis not present

## 2020-06-06 DIAGNOSIS — M2550 Pain in unspecified joint: Secondary | ICD-10-CM | POA: Diagnosis not present

## 2020-06-06 DIAGNOSIS — G629 Polyneuropathy, unspecified: Secondary | ICD-10-CM | POA: Diagnosis not present

## 2020-06-06 DIAGNOSIS — Z853 Personal history of malignant neoplasm of breast: Secondary | ICD-10-CM | POA: Diagnosis not present

## 2020-06-06 DIAGNOSIS — Z833 Family history of diabetes mellitus: Secondary | ICD-10-CM | POA: Diagnosis not present

## 2020-06-06 DIAGNOSIS — Z8249 Family history of ischemic heart disease and other diseases of the circulatory system: Secondary | ICD-10-CM | POA: Diagnosis not present

## 2020-06-06 DIAGNOSIS — R5383 Other fatigue: Secondary | ICD-10-CM | POA: Diagnosis not present

## 2020-06-06 DIAGNOSIS — R110 Nausea: Secondary | ICD-10-CM | POA: Diagnosis not present

## 2020-06-06 DIAGNOSIS — Z809 Family history of malignant neoplasm, unspecified: Secondary | ICD-10-CM | POA: Diagnosis not present

## 2020-06-06 DIAGNOSIS — R102 Pelvic and perineal pain: Secondary | ICD-10-CM | POA: Diagnosis not present

## 2020-06-06 LAB — COMPREHENSIVE METABOLIC PANEL
ALT: 17 U/L (ref 0–44)
AST: 24 U/L (ref 15–41)
Albumin: 3.5 g/dL (ref 3.5–5.0)
Alkaline Phosphatase: 147 U/L — ABNORMAL HIGH (ref 38–126)
Anion gap: 8 (ref 5–15)
BUN: 9 mg/dL (ref 8–23)
CO2: 25 mmol/L (ref 22–32)
Calcium: 9.1 mg/dL (ref 8.9–10.3)
Chloride: 106 mmol/L (ref 98–111)
Creatinine, Ser: 0.68 mg/dL (ref 0.44–1.00)
GFR calc Af Amer: >60 mL/min (ref >60)
GFR calc non Af Amer: >60 mL/min (ref >60)
Glucose, Bld: 97 mg/dL (ref 70–99)
Potassium: 4 mmol/L (ref 3.5–5.1)
Sodium: 139 mmol/L (ref 135–145)
Total Bilirubin: 0.5 mg/dL (ref 0.3–1.2)
Total Protein: 6.4 g/dL — ABNORMAL LOW (ref 6.5–8.1)

## 2020-06-06 LAB — CBC WITH DIFFERENTIAL/PLATELET
Abs Immature Granulocytes: 0.01 10*3/uL (ref 0.00–0.07)
Basophils Absolute: 0 10*3/uL (ref 0.0–0.1)
Basophils Relative: 1 %
Eosinophils Absolute: 0.2 10*3/uL (ref 0.0–0.5)
Eosinophils Relative: 4 %
HCT: 33.7 % — ABNORMAL LOW (ref 36.0–46.0)
Hemoglobin: 10.9 g/dL — ABNORMAL LOW (ref 12.0–15.0)
Immature Granulocytes: 0 %
Lymphocytes Relative: 23 %
Lymphs Abs: 1.2 10*3/uL (ref 0.7–4.0)
MCH: 33.5 pg (ref 26.0–34.0)
MCHC: 32.3 g/dL (ref 30.0–36.0)
MCV: 103.7 fL — ABNORMAL HIGH (ref 80.0–100.0)
Monocytes Absolute: 0.9 10*3/uL (ref 0.1–1.0)
Monocytes Relative: 16 %
Neutro Abs: 3 10*3/uL (ref 1.7–7.7)
Neutrophils Relative %: 56 %
Platelets: 231 10*3/uL (ref 150–400)
RBC: 3.25 MIL/uL — ABNORMAL LOW (ref 3.87–5.11)
RDW: 14.2 % (ref 11.5–15.5)
WBC: 5.3 10*3/uL (ref 4.0–10.5)
nRBC: 0 % (ref 0.0–0.2)

## 2020-06-06 LAB — MAGNESIUM: Magnesium: 1.9 mg/dL (ref 1.7–2.4)

## 2020-06-06 MED ORDER — ERGOCALCIFEROL 1.25 MG (50000 UT) PO CAPS: 50000.0000 [IU] | ORAL_CAPSULE | ORAL | 1 refills | Status: DC

## 2020-06-06 NOTE — Progress Notes (Signed)
[place], [place] [address]   CLINIC:  Medical Oncology/Hematology  PCP:  [name], MD [place] / [place] [address]  [phone]  REASON FOR VISIT:  Follow-up for multiple myeloma  PRIOR THERAPY:  1. Bortezomib x 6 cycles from 08/19/2019 to 12/15/2019. 2. 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  CURRENT THERAPY: Revlimid 2 weeks on, 1 week off  INTERVAL HISTORY:  [name], a 64 y.o. female, returns for routine follow-up for her multiple myleoma. Stephanie Galvan was last seen on 05/23/2020.  Today she is accompanied by her granddaughter. She reports having some sensation of discomfort in her lower abdomen and nausea associated with it, but denies issues with her BM's or urination; she denies having the abdominal discomfort now. She complains of having numbness and burning in her feet, especially at night; she is not taking gabapentin. She threw up once last week.  She will follow up with [facility] on 10/7.    REVIEW OF SYSTEMS:  Review of Systems  Constitutional: Positive for fatigue (mild). Negative for appetite change.  Gastrointestinal: Positive for constipation, nausea (occasional w/ abdominal cramping) and vomiting (x1 episode last week).  Genitourinary: Negative for dysuria.   Musculoskeletal: Positive for arthralgias (1/10 foot throbbing).  Neurological: Positive for numbness (& burning in feet).  All other systems reviewed and are negative.   PAST MEDICAL/SURGICAL HISTORY:  Past Medical History:  Diagnosis Date  . Breast cancer ([facility]) 2006   left breast  . Breast mass, right   . Compression fracture of cervical spine ([facility]) 06/18/2019   recent Chest xray stating had fx.  . High cholesterol   . History of bladder infections   . Hypertension   . Rash Aug 2013   Past Surgical History:  Procedure Laterality Date  . ABDOMINAL HYSTERECTOMY     just uterus removed  . BREAST BIOPSY    . BREAST LUMPECTOMY WITH  RADIOACTIVE SEED LOCALIZATION Right 07/24/2018   Procedure: RIGHT BREAST LUMPECTOMY WITH RADIOACTIVE SEED LOCALIZATION;  Surgeon: Coralie Keens, MD;  Location: [facility];  Service: General;  Laterality: Right;  . MASTECTOMY  5/06   left  . PARTIAL HYSTERECTOMY    . PORT-A-CATH REMOVAL    . PORTACATH PLACEMENT      SOCIAL HISTORY:  Social History   Socioeconomic History  . Marital status: Married    Spouse name: Not on file  . Number of children: 2  . Years of education: Not on file  . Highest education level: Not on file  Occupational History    Employer: [employer]  Tobacco Use  . Smoking status: Never Smoker  . Smokeless tobacco: Never Used  Vaping Use  . Vaping Use: Never used  Substance and Sexual Activity  . Alcohol use: No  . Drug use: No  . Sexual activity: Yes    Birth control/protection: Surgical  Other Topics Concern  . Not on file  Social History Narrative  . Not on file   Social Determinants of Health   Financial Resource Strain:   . Difficulty of Paying Living Expenses: Not on file  Food Insecurity:   . Worried About Charity fundraiser in the Last Year: Not on file  . Ran Out of Food in the Last Year: Not on file  Transportation Needs:   . Lack of Transportation (Medical): Not on file  . Lack of Transportation (Non-Medical): Not on file  Physical Activity:   . Days of Exercise  per Week: Not on file  . Minutes of Exercise per Session: Not on file  Stress:   . Feeling of Stress : Not on file  Social Connections:   . Frequency of Communication with Friends and Family: Not on file  . Frequency of Social Gatherings with Friends and Family: Not on file  . Attends Religious Services: Not on file  . Active Member of Clubs or Organizations: Not on file  . Attends Archivist Meetings: Not on file  . Marital Status: Not on file  Intimate Partner Violence:   . Fear of Current or Ex-Partner: Not on file  . Emotionally  Abused: Not on file  . Physically Abused: Not on file  . Sexually Abused: Not on file    FAMILY HISTORY:  Family History  Problem Relation Age of Onset  . Dementia Mother   . Stomach cancer Father   . Cancer Brother   . Cancer Brother   . Diabetes Brother   . Colitis Daughter   . Hypertension Daughter   . Hypertension Daughter     CURRENT MEDICATIONS:  Current Outpatient Medications  Medication Sig Dispense Refill  . acyclovir (ZOVIRAX) 400 MG tablet Take 1 tablet (400 mg total) by mouth 2 (two) times daily. 180 tablet 1  . calcium carbonate (TUMS - DOSED IN MG ELEMENTAL CALCIUM) 500 MG chewable tablet Chew 2 tablets by mouth 2 (two) times daily.    . ergocalciferol (VITAMIN D2) 1.25 MG (50000 UT) capsule Take 1 capsule (50,000 Units total) by mouth once a week. 12 capsule 1  . fluticasone (FLOVENT HFA) 220 MCG/ACT inhaler Inhale into the lungs.    Marland Kitchen omeprazole (PRILOSEC) 40 MG capsule Take by mouth.    . ondansetron (ZOFRAN) 8 MG tablet Take by mouth.     . potassium chloride (KLOR-CON) 10 MEQ tablet Take 2 tablets (20 mEq total) by mouth 3 (three) times daily. 180 tablet 2  . prochlorperazine (COMPAZINE) 10 MG tablet Take 1 tablet (10 mg total) by mouth every 6 (six) hours as needed for nausea or vomiting. 45 tablet 2  . simethicone (MYLICON) 80 MG chewable tablet Chew by mouth.    . lenalidomide (REVLIMID) 25 MG capsule Take one capsule daily on days 1-14 every 21 days. (Patient not taking: Reported on 06/06/2020) 14 capsule 0   No current facility-administered medications for this visit.    ALLERGIES:  No Known Allergies  PHYSICAL EXAM:  Performance status (ECOG): 1 - Symptomatic but completely ambulatory  Vitals:   06/06/20 1256  BP: 138/87  Pulse: 96  Resp: 18  Temp: (!) 96.8 F (36 C)  SpO2: 100%   Wt Readings from Last 3 Encounters:  06/06/20 149 lb (67.6 kg)  05/23/20 150 lb 1.6 oz (68.1 kg)  03/28/20 157 lb 1.6 oz (71.3 kg)   Physical Exam Vitals  reviewed.  Constitutional:      Appearance: Normal appearance.  Cardiovascular:     Rate and Rhythm: Normal rate and regular rhythm.     Pulses: Normal pulses.     Heart sounds: Normal heart sounds.  Pulmonary:     Effort: Pulmonary effort is normal.     Breath sounds: Normal breath sounds.  Abdominal:     Palpations: Abdomen is soft. There is no mass.     Tenderness: There is no abdominal tenderness.  Neurological:     General: No focal deficit present.     Mental Status: She is alert and oriented to person, place,  and time.  Psychiatric:        Mood and Affect: Mood normal.        Behavior: Behavior normal.     LABORATORY DATA:  I have reviewed the labs as listed.  CBC Latest Ref Rng & Units 06/06/2020 05/30/2020 05/23/2020  WBC 4.0 - 10.5 K/uL 5.3 4.3 6.4  Hemoglobin 12.0 - 15.0 g/dL 10.9(L) 11.0(L) 10.4(L)  Hematocrit 36 - 46 % 33.7(L) 34.8(L) 32.9(L)  Platelets 150 - 400 K/uL 231 336 281   CMP Latest Ref Rng & Units 06/06/2020 05/30/2020 05/23/2020  Glucose 70 - 99 mg/dL 97 125(H) 122(H)  BUN 8 - 23 mg/dL $Remove'9 8 9  'meDHQMw$ Creatinine 0.44 - 1.00 mg/dL 0.68 0.75 0.71  Sodium 135 - 145 mmol/L 139 138 138  Potassium 3.5 - 5.1 mmol/L 4.0 3.9 4.5  Chloride 98 - 111 mmol/L 106 104 105  CO2 22 - 32 mmol/L $RemoveB'25 26 24  'ttaWToOs$ Calcium 8.9 - 10.3 mg/dL 9.1 9.6 9.3  Total Protein 6.5 - 8.1 g/dL 6.4(L) 6.7 6.3(L)  Total Bilirubin 0.3 - 1.2 mg/dL 0.5 0.6 0.5  Alkaline Phos 38 - 126 U/L 147(H) 171(H) 185(H)  AST 15 - 41 U/L $Remo'24 26 26  'KkaZm$ ALT 0 - 44 U/L $Remo'17 17 20      'nyBen$ Component Value Date/Time   RBC 3.25 (L) 06/06/2020 1209   MCV 103.7 (H) 06/06/2020 1209   MCH 33.5 06/06/2020 1209   MCHC 32.3 06/06/2020 1209   RDW 14.2 06/06/2020 1209   LYMPHSABS 1.2 06/06/2020 1209   MONOABS 0.9 06/06/2020 1209   EOSABS 0.2 06/06/2020 1209   BASOSABS 0.0 06/06/2020 1209    DIAGNOSTIC IMAGING:  I have independently reviewed the scans and discussed with the patient. No results found.   ASSESSMENT:  1.Stage II  (standard risk) IgA lambda plasma cell myeloma: -5 cycles of RVD from 08/19/2019 through 11/24/2019. Revlimid started on 10/06/2019 due to delay. -Velcade held since 12/15/2019 due to gastric symptoms, which have resolved completely. -She was evaluated by Dr. Jannifer Franklin. Recommended to have immediate bone marrow transplant. -She has compression fractures of T8, T10 and T12. Also L1 and L3 compression fractures. -Revlimid and dexamethasone continued until 03/22/2020. 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   PLAN:  1. IgA lambda plasma cell myeloma: -She is day +38 autotransplant. -Fatigue is mildly improving.  Reviewed labs from 06/06/2020 which showed slightly improved alk phos of 147.  Rest of LFTs are normal.  CBC shows normal white count and platelets. -She has follow-up at [facility] on 06/16/2020. -I plan to see her back in 4 weeks for follow-up and labs.  2.  Suprapubic pain: -This has improved from last visit.  3. Bone protection: -We will plan to start Zometa in the future.  4. Hypokalemia: -Potassium is 4.0 today.  She is taking 20 mEq twice daily. -I have recommended cutting down to 20 mEq once daily.  5. ID prophylaxis: -Continue acyclovir twice daily.  Restart aspirin 81 mg daily.  6.  Neuropathy: -She has burning pain in the feet which is worse recently. -We will start gabapentin 300 mg at bedtime.  Orders placed this encounter:  No orders of the defined types were placed in this encounter.    Derek Jack, MD [place] [phone]   I, Milinda Antis, am acting as a scribe for Dr. Sanda Linger.  I, Derek Jack MD, have reviewed the above documentation for accuracy and completeness, and I  agree with the above.

## 2020-06-06 NOTE — Patient Instructions (Signed)
[place] at [facility] Discharge Instructions  You were seen today by Dr. Delton Coombes. He went over your recent results. You can restart taking the aspirin 81 mg. Take 2 tablets of potassium once daily. Take 3 tablets of gabapentin at bedtime to help with the burning in your feet. Dr. Delton Coombes will see you back in 4 weeks for labs and follow up.   Thank you for choosing [place] at [facility] to provide your oncology and hematology care.  To afford each patient quality time with our provider, please arrive at least 15 minutes before your scheduled appointment time.   If you have a lab appointment with the [facility] please come in thru the Main Entrance and check in at the main information desk  You need to re-schedule your appointment should you arrive 10 or more minutes late.  We strive to give you quality time with our providers, and arriving late affects you and other patients whose appointments are after yours.  Also, if you no show three or more times for appointments you may be dismissed from the clinic at the providers discretion.     Again, thank you for choosing [facility].  Our hope is that these requests will decrease the amount of time that you wait before being seen by our physicians.       _____________________________________________________________  Should you have questions after your visit to [facility], please contact our office at [phone] between the hours of 8:00 a.m. and 4:30 p.m.  Voicemails left after 4:00 p.m. will not be returned until the following business day.  For prescription refill requests, have your pharmacy contact our office and allow 72 hours.    Cancer Center Support Programs:   > Cancer Support Group  2nd Tuesday of the month 1pm-2pm, Journey Room

## 2020-06-11 ENCOUNTER — Other Ambulatory Visit (HOSPITAL_COMMUNITY): Payer: Self-pay | Admitting: Hematology

## 2020-06-13 ENCOUNTER — Other Ambulatory Visit: Payer: Self-pay

## 2020-06-13 ENCOUNTER — Inpatient Hospital Stay (HOSPITAL_COMMUNITY): Payer: Medicare HMO | Attending: Hematology

## 2020-06-13 DIAGNOSIS — Z8379 Family history of other diseases of the digestive system: Secondary | ICD-10-CM | POA: Insufficient documentation

## 2020-06-13 DIAGNOSIS — Z833 Family history of diabetes mellitus: Secondary | ICD-10-CM | POA: Diagnosis not present

## 2020-06-13 DIAGNOSIS — Z79899 Other long term (current) drug therapy: Secondary | ICD-10-CM | POA: Insufficient documentation

## 2020-06-13 DIAGNOSIS — Z853 Personal history of malignant neoplasm of breast: Secondary | ICD-10-CM | POA: Insufficient documentation

## 2020-06-13 DIAGNOSIS — M546 Pain in thoracic spine: Secondary | ICD-10-CM | POA: Diagnosis not present

## 2020-06-13 DIAGNOSIS — Z8249 Family history of ischemic heart disease and other diseases of the circulatory system: Secondary | ICD-10-CM | POA: Insufficient documentation

## 2020-06-13 DIAGNOSIS — Z800 Family history of malignant neoplasm of digestive organs: Secondary | ICD-10-CM | POA: Diagnosis not present

## 2020-06-13 DIAGNOSIS — Z818 Family history of other mental and behavioral disorders: Secondary | ICD-10-CM | POA: Insufficient documentation

## 2020-06-13 DIAGNOSIS — E876 Hypokalemia: Secondary | ICD-10-CM | POA: Diagnosis not present

## 2020-06-13 DIAGNOSIS — R200 Anesthesia of skin: Secondary | ICD-10-CM | POA: Diagnosis not present

## 2020-06-13 DIAGNOSIS — Z809 Family history of malignant neoplasm, unspecified: Secondary | ICD-10-CM | POA: Insufficient documentation

## 2020-06-13 DIAGNOSIS — G629 Polyneuropathy, unspecified: Secondary | ICD-10-CM | POA: Diagnosis not present

## 2020-06-13 DIAGNOSIS — R609 Edema, unspecified: Secondary | ICD-10-CM | POA: Diagnosis not present

## 2020-06-13 DIAGNOSIS — C9000 Multiple myeloma not having achieved remission: Secondary | ICD-10-CM

## 2020-06-13 DIAGNOSIS — M7989 Other specified soft tissue disorders: Secondary | ICD-10-CM | POA: Diagnosis not present

## 2020-06-13 LAB — COMPREHENSIVE METABOLIC PANEL
ALT: 15 U/L (ref 0–44)
AST: 24 U/L (ref 15–41)
Albumin: 3.8 g/dL (ref 3.5–5.0)
Alkaline Phosphatase: 128 U/L — ABNORMAL HIGH (ref 38–126)
Anion gap: 9 (ref 5–15)
BUN: 13 mg/dL (ref 8–23)
CO2: 25 mmol/L (ref 22–32)
Calcium: 9.3 mg/dL (ref 8.9–10.3)
Chloride: 104 mmol/L (ref 98–111)
Creatinine, Ser: 0.68 mg/dL (ref 0.44–1.00)
GFR calc Af Amer: >60 mL/min (ref >60)
GFR calc non Af Amer: >60 mL/min (ref >60)
Glucose, Bld: 90 mg/dL (ref 70–99)
Potassium: 4.2 mmol/L (ref 3.5–5.1)
Sodium: 138 mmol/L (ref 135–145)
Total Bilirubin: 0.6 mg/dL (ref 0.3–1.2)
Total Protein: 6.1 g/dL — ABNORMAL LOW (ref 6.5–8.1)

## 2020-06-13 LAB — CBC WITH DIFFERENTIAL/PLATELET
Abs Immature Granulocytes: 0.01 10*3/uL (ref 0.00–0.07)
Basophils Absolute: 0 10*3/uL (ref 0.0–0.1)
Basophils Relative: 1 %
Eosinophils Absolute: 0.2 10*3/uL (ref 0.0–0.5)
Eosinophils Relative: 4 %
HCT: 34.4 % — ABNORMAL LOW (ref 36.0–46.0)
Hemoglobin: 11 g/dL — ABNORMAL LOW (ref 12.0–15.0)
Immature Granulocytes: 0 %
Lymphocytes Relative: 18 %
Lymphs Abs: 1 10*3/uL (ref 0.7–4.0)
MCH: 32.6 pg (ref 26.0–34.0)
MCHC: 32 g/dL (ref 30.0–36.0)
MCV: 102.1 fL — ABNORMAL HIGH (ref 80.0–100.0)
Monocytes Absolute: 0.6 10*3/uL (ref 0.1–1.0)
Monocytes Relative: 11 %
Neutro Abs: 3.8 10*3/uL (ref 1.7–7.7)
Neutrophils Relative %: 66 %
Platelets: 201 10*3/uL (ref 150–400)
RBC: 3.37 MIL/uL — ABNORMAL LOW (ref 3.87–5.11)
RDW: 14.3 % (ref 11.5–15.5)
WBC: 5.6 10*3/uL (ref 4.0–10.5)
nRBC: 0 % (ref 0.0–0.2)

## 2020-06-13 LAB — MAGNESIUM: Magnesium: 1.7 mg/dL (ref 1.7–2.4)

## 2020-06-20 ENCOUNTER — Other Ambulatory Visit (HOSPITAL_COMMUNITY): Payer: BC Managed Care – PPO

## 2020-06-28 ENCOUNTER — Other Ambulatory Visit (HOSPITAL_COMMUNITY): Payer: BC Managed Care – PPO

## 2020-07-01 ENCOUNTER — Other Ambulatory Visit (HOSPITAL_COMMUNITY): Payer: Self-pay | Admitting: Family Medicine

## 2020-07-01 DIAGNOSIS — Z1231 Encounter for screening mammogram for malignant neoplasm of breast: Secondary | ICD-10-CM

## 2020-07-05 ENCOUNTER — Other Ambulatory Visit (HOSPITAL_COMMUNITY): Payer: Self-pay | Admitting: *Deleted

## 2020-07-05 ENCOUNTER — Inpatient Hospital Stay (HOSPITAL_COMMUNITY): Payer: Medicare HMO

## 2020-07-05 ENCOUNTER — Other Ambulatory Visit: Payer: Self-pay

## 2020-07-05 ENCOUNTER — Inpatient Hospital Stay (HOSPITAL_COMMUNITY): Payer: Medicare HMO | Admitting: Hematology

## 2020-07-05 VITALS — BP 143/87 | HR 88 | Temp 96.9°F | Resp 16 | Wt 153.2 lb

## 2020-07-05 DIAGNOSIS — C9000 Multiple myeloma not having achieved remission: Secondary | ICD-10-CM

## 2020-07-05 LAB — COMPREHENSIVE METABOLIC PANEL
ALT: 18 U/L (ref 0–44)
AST: 23 U/L (ref 15–41)
Albumin: 4 g/dL (ref 3.5–5.0)
Alkaline Phosphatase: 125 U/L (ref 38–126)
Anion gap: 6 (ref 5–15)
BUN: 15 mg/dL (ref 8–23)
CO2: 26 mmol/L (ref 22–32)
Calcium: 9.5 mg/dL (ref 8.9–10.3)
Chloride: 109 mmol/L (ref 98–111)
Creatinine, Ser: 0.71 mg/dL (ref 0.44–1.00)
GFR, Estimated: >60 mL/min (ref >60)
Glucose, Bld: 70 mg/dL (ref 70–99)
Potassium: 3.5 mmol/L (ref 3.5–5.1)
Sodium: 141 mmol/L (ref 135–145)
Total Bilirubin: 0.7 mg/dL (ref 0.3–1.2)
Total Protein: 6.6 g/dL (ref 6.5–8.1)

## 2020-07-05 LAB — CBC WITH DIFFERENTIAL/PLATELET
Abs Immature Granulocytes: 0.01 10*3/uL (ref 0.00–0.07)
Basophils Absolute: 0 10*3/uL (ref 0.0–0.1)
Basophils Relative: 0 %
Eosinophils Absolute: 0.1 10*3/uL (ref 0.0–0.5)
Eosinophils Relative: 3 %
HCT: 36.3 % (ref 36.0–46.0)
Hemoglobin: 11.7 g/dL — ABNORMAL LOW (ref 12.0–15.0)
Immature Granulocytes: 0 %
Lymphocytes Relative: 26 %
Lymphs Abs: 1.3 10*3/uL (ref 0.7–4.0)
MCH: 32.6 pg (ref 26.0–34.0)
MCHC: 32.2 g/dL (ref 30.0–36.0)
MCV: 101.1 fL — ABNORMAL HIGH (ref 80.0–100.0)
Monocytes Absolute: 0.5 10*3/uL (ref 0.1–1.0)
Monocytes Relative: 10 %
Neutro Abs: 2.9 10*3/uL (ref 1.7–7.7)
Neutrophils Relative %: 61 %
Platelets: 156 10*3/uL (ref 150–400)
RBC: 3.59 MIL/uL — ABNORMAL LOW (ref 3.87–5.11)
RDW: 12.8 % (ref 11.5–15.5)
WBC: 4.7 10*3/uL (ref 4.0–10.5)
nRBC: 0 % (ref 0.0–0.2)

## 2020-07-05 LAB — MAGNESIUM: Magnesium: 1.8 mg/dL (ref 1.7–2.4)

## 2020-07-05 MED ORDER — FLUTICASONE PROPIONATE HFA 220 MCG/ACT IN AERO: 1.0000 | INHALATION_SPRAY | Freq: Every day | RESPIRATORY_TRACT | 3 refills | Status: DC | PRN

## 2020-07-05 MED ORDER — FUROSEMIDE 20 MG PO TABS: 20.0000 mg | ORAL_TABLET | Freq: Every day | ORAL | 1 refills | Status: DC | PRN

## 2020-07-05 MED ORDER — HYDROCODONE-ACETAMINOPHEN 10-325 MG PO TABS
1.0000 | ORAL_TABLET | Freq: Two times a day (BID) | ORAL | 0 refills | Status: DC | PRN
Start: 2020-07-05 — End: 2022-12-12

## 2020-07-05 NOTE — Patient Instructions (Addendum)
[place] at [facility] Discharge Instructions  You were seen today by Dr. Delton Coombes. He went over your recent results and scans. You will be scheduled for an MRI of your back before your next visit. You will be prescribed Lasix 20 mg to take as needed for your leg swelling. Use the fluticasone nasal spray only as needed. Dr. Delton Coombes will see you back in 2 weeks for labs and follow up.   Thank you for choosing [place] at [facility] to provide your oncology and hematology care.  To afford each patient quality time with our provider, please arrive at least 15 minutes before your scheduled appointment time.   If you have a lab appointment with the [facility] please come in thru the Main Entrance and check in at the main information desk  You need to re-schedule your appointment should you arrive 10 or more minutes late.  We strive to give you quality time with our providers, and arriving late affects you and other patients whose appointments are after yours.  Also, if you no show three or more times for appointments you may be dismissed from the clinic at the providers discretion.     Again, thank you for choosing [facility].  Our hope is that these requests will decrease the amount of time that you wait before being seen by our physicians.       _____________________________________________________________  Should you have questions after your visit to [facility], please contact our office at [phone] between the hours of 8:00 a.m. and 4:30 p.m.  Voicemails left after 4:00 p.m. will not be returned until the following business day.  For prescription refill requests, have your pharmacy contact our office and allow 72 hours.    Cancer Center Support Programs:   > Cancer Support Group  2nd Tuesday of the month 1pm-2pm, Journey Room

## 2020-07-05 NOTE — Progress Notes (Signed)
Stephanie Galvan, [place] [address]   CLINIC:  Medical Oncology/Hematology  PCP:  [name], MD [place] / [place] [address]  [phone]  REASON FOR VISIT:  Follow-up for multiple myeloma  PRIOR THERAPY:  1. Bortezomib x 6 cycles from 08/19/2019 to 12/15/2019. 2. 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  CURRENT THERAPY: Revlimid 2 weeks on, 1 week off  INTERVAL HISTORY:  [name], a 64 y.o. female, returns for routine follow-up for her multiple myeloma. Stephanie Galvan was last seen on 06/06/2020.  Today she reports feeling okay. She complains of having pain in her upper back especially if she stands or moves for a while. She denies having any BUE or BLE weakness. She denies having any kyphoplasties. She takes Norco every couple days for it which helps. She denies having abdominal pain. She is taking gabapentin for her burning feet and it is helping; she reports having swelling in her feet which accumulates as the day progresses. Her appetite is great.  She saw Dr. Alvie Heidelberg on 10/7 and will follow up with her in June.   REVIEW OF SYSTEMS:  Review of Systems  Constitutional: Positive for appetite change (90%).  Gastrointestinal: Negative for abdominal pain.  Musculoskeletal: Positive for back pain (w/ prolonged standing or moving).  Neurological: Positive for numbness (& burning in feet). Negative for extremity weakness.  All other systems reviewed and are negative.   PAST MEDICAL/SURGICAL HISTORY:  Past Medical History:  Diagnosis Date  . Breast cancer ([facility]) 2006   left breast  . Breast mass, right   . Compression fracture of cervical spine ([facility]) 06/18/2019   recent Chest xray stating had fx.  . High cholesterol   . History of bladder infections   . Hypertension   . Rash Aug 2013   Past Surgical History:  Procedure Laterality Date  . ABDOMINAL HYSTERECTOMY     just uterus removed  . BREAST BIOPSY    . BREAST  LUMPECTOMY WITH RADIOACTIVE SEED LOCALIZATION Right 07/24/2018   Procedure: RIGHT BREAST LUMPECTOMY WITH RADIOACTIVE SEED LOCALIZATION;  Surgeon: Coralie Keens, MD;  Location: [facility];  Service: General;  Laterality: Right;  . MASTECTOMY  5/06   left  . PARTIAL HYSTERECTOMY    . PORT-A-CATH REMOVAL    . PORTACATH PLACEMENT      SOCIAL HISTORY:  Social History   Socioeconomic History  . Marital status: Married    Spouse name: Not on file  . Number of children: 2  . Years of education: Not on file  . Highest education level: Not on file  Occupational History    Employer: [employer]  Tobacco Use  . Smoking status: Never Smoker  . Smokeless tobacco: Never Used  Vaping Use  . Vaping Use: Never used  Substance and Sexual Activity  . Alcohol use: No  . Drug use: No  . Sexual activity: Yes    Birth control/protection: Surgical  Other Topics Concern  . Not on file  Social History Narrative  . Not on file   Social Determinants of Health   Financial Resource Strain:   . Difficulty of Paying Living Expenses: Not on file  Food Insecurity:   . Worried About Charity fundraiser in the Last Year: Not on file  . Ran Out of Food in the Last Year: Not on file  Transportation Needs:   . Lack of Transportation (Medical): Not on file  . Lack of Transportation (Non-Medical): Not  on file  Physical Activity:   . Days of Exercise per Week: Not on file  . Minutes of Exercise per Session: Not on file  Stress:   . Feeling of Stress : Not on file  Social Connections:   . Frequency of Communication with Friends and Family: Not on file  . Frequency of Social Gatherings with Friends and Family: Not on file  . Attends Religious Services: Not on file  . Active Member of Clubs or Organizations: Not on file  . Attends Club or Organization Meetings: Not on file  . Marital Status: Not on file  Intimate Partner Violence:   . Fear of Current or Ex-Partner: Not on file  .  Emotionally Abused: Not on file  . Physically Abused: Not on file  . Sexually Abused: Not on file    FAMILY HISTORY:  Family History  Problem Relation Age of Onset  . Dementia Mother   . Stomach cancer Father   . Cancer Brother   . Cancer Brother   . Diabetes Brother   . Colitis Daughter   . Hypertension Daughter   . Hypertension Daughter     CURRENT MEDICATIONS:  Current Outpatient Medications  Medication Sig Dispense Refill  . acyclovir (ZOVIRAX) 400 MG tablet Take 1 tablet (400 mg total) by mouth 2 (two) times daily. 180 tablet 1  . calcium carbonate (TUMS - DOSED IN MG ELEMENTAL CALCIUM) 500 MG chewable tablet Chew 2 tablets by mouth 2 (two) times daily.    . ergocalciferol (VITAMIN D2) 1.25 MG (50000 UT) capsule Take 1 capsule (50,000 Units total) by mouth once a week. 12 capsule 1  . fluticasone (FLOVENT HFA) 220 MCG/ACT inhaler Inhale into the lungs.    . lenalidomide (REVLIMID) 25 MG capsule Take one capsule daily on days 1-14 every 21 days. 14 capsule 0  . potassium chloride (KLOR-CON) 10 MEQ tablet TAKE 2 TABLETS (20 MEQ TOTAL) BY MOUTH 3 (THREE) TIMES DAILY. 540 tablet 1  . simethicone (MYLICON) 80 MG chewable tablet Chew by mouth.    . omeprazole (PRILOSEC) 40 MG capsule Take by mouth.    . ondansetron (ZOFRAN) 8 MG tablet Take by mouth.  (Patient not taking: Reported on 07/05/2020)    . prochlorperazine (COMPAZINE) 10 MG tablet Take 1 tablet (10 mg total) by mouth every 6 (six) hours as needed for nausea or vomiting. (Patient not taking: Reported on 07/05/2020) 45 tablet 2   No current facility-administered medications for this visit.    ALLERGIES:  No Known Allergies  PHYSICAL EXAM:  Performance status (ECOG): 1 - Symptomatic but completely ambulatory  Vitals:   07/05/20 1030  BP: (!) 143/87  Pulse: 88  Resp: 16  Temp: (!) 96.9 F (36.1 C)  SpO2: 100%   Wt Readings from Last 3 Encounters:  07/05/20 153 lb 3.2 oz (69.5 kg)  06/06/20 149 lb (67.6 kg)    05/23/20 150 lb 1.6 oz (68.1 kg)   Physical Exam Vitals reviewed.  Constitutional:      Appearance: Normal appearance.  Cardiovascular:     Rate and Rhythm: Normal rate and regular rhythm.     Pulses: Normal pulses.     Heart sounds: Normal heart sounds.  Pulmonary:     Effort: Pulmonary effort is normal.     Breath sounds: Normal breath sounds.  Abdominal:     Palpations: Abdomen is soft. There is no mass.     Tenderness: There is no abdominal tenderness.  Musculoskeletal:       Thoracic back: No tenderness or bony tenderness.     Right lower leg: Edema (trace) present.     Left lower leg: Edema (trace) present.  Neurological:     General: No focal deficit present.     Mental Status: She is alert and oriented to person, place, and time.  Psychiatric:        Mood and Affect: Mood normal.        Behavior: Behavior normal.     LABORATORY DATA:  I have reviewed the labs as listed.  CBC Latest Ref Rng & Units 07/05/2020 06/13/2020 06/06/2020  WBC 4.0 - 10.5 K/uL 4.7 5.6 5.3  Hemoglobin 12.0 - 15.0 g/dL 11.7(L) 11.0(L) 10.9(L)  Hematocrit 36 - 46 % 36.3 34.4(L) 33.7(L)  Platelets 150 - 400 K/uL 156 201 231   CMP Latest Ref Rng & Units 07/05/2020 06/13/2020 06/06/2020  Glucose 70 - 99 mg/dL 70 90 97  BUN 8 - 23 mg/dL _0 Creatinine 0.44 - 1.00 mg/dL 0.71 0.68 0.68  Sodium 135 - 145 mmol/L 141 138 139  Potassium 3.5 - 5.1 mmol/L 3.5 4.2 4.0  Chloride 98 - 111 mmol/L 109 104 106  CO2 22 - 32 mmol/L _1 Calcium 8.9 - 10.3 mg/dL 9.5 9.3 9.1  Total Protein 6.5 - 8.1 g/dL 6.6 6.1(L) 6.4(L)  Total Bilirubin 0.3 - 1.2 mg/dL 0.7 0.6 0.5  Alkaline Phos 38 - 126 U/L 125 128(H) 147(H)  AST 15 - 41 U/L _2 ALT 0 - 44 U/L _3 Component Value Date/Time   RBC 3.59 (L) 07/05/2020 0948   MCV 101.1 (H) 07/05/2020 0948   MCH 32.6 07/05/2020 0948   MCHC 32.2 07/05/2020 0948   RDW 12.8 07/05/2020 0948   LYMPHSABS 1.3 07/05/2020 0948   MONOABS 0.5 07/05/2020 0948    EOSABS 0.1 07/05/2020 0948   BASOSABS 0.0 07/05/2020 0948   Lab Results  Component Value Date   TOTALPROTELP 5.8 (L) 02/29/2020   ALBUMINELP 3.3 02/29/2020   A1GS 0.2 02/29/2020   A2GS 0.7 02/29/2020   BETS 1.0 02/29/2020   GAMS 0.6 02/29/2020   MSPIKE Not Observed 02/29/2020   SPEI Comment 02/29/2020    Lab Results  Component Value Date   KPAFRELGTCHN 16.3 02/29/2020   LAMBDASER 14.1 02/29/2020   KAPLAMBRATIO 1.16 02/29/2020    DIAGNOSTIC IMAGING:  I have independently reviewed the scans and discussed with the patient. No results found.   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 Revlimid started on 10/06/2019 due to delay. -Velcade held since 12/15/2019 due to gastric symptoms, which have resolved completely. -She was evaluated by Dr. Jannifer Franklin. Recommended to have immediate bone marrow transplant. -She has compression fractures of T8, T10 and T12. Also L1 and L3 compression fractures. -Revlimid and dexamethasone continued until 03/22/2020. 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   PLAN:  1. IgA lambda plasma cell myeloma: -She is approximately day +70 after transplant. -Reviewed labs from today which showed normal calcium, creatinine and CBC. -Patient reports pain in the upper back region, more pronounced. -She was recently evaluated at [facility] on 06/16/2020. -We will plan to start her on Revlimid maintenance after day 100. -Recent myeloma panel which is pending from today.  2.  Upper back pain: -She reports upper back pain more pronounced in the past few  weeks. -Recommend MRI of the thoracic spine as she had previous history of T8, T10 and T12 vertebral fractures. -I have given prescription for hydrocodone 1 tablet as needed. -RTC after the scan.  3. Bone protection: -Plan to start her on Zometa at day 100.  4.  Hypokalemia: -Continue potassium 20 mEq once daily.  Potassium today is 3.5.  5. ID prophylaxis: -Continue acyclovir twice daily.  Continue aspirin 81 mg daily.  6.  Neuropathy: -Continue gabapentin 300 mg at bedtime.  Burning pain in the feet has improved.  Orders placed this encounter:  No orders of the defined types were placed in this encounter.    Sreedhar Katragadda, MD [facility] [phone]   I, Daniel Khashchuk, am acting as a scribe for Dr. Sreedhar Katagadda.  I, Sreedhar Katragadda MD, have reviewed the above documentation for accuracy and completeness, and I agree with the above.     

## 2020-07-06 ENCOUNTER — Ambulatory Visit (HOSPITAL_COMMUNITY)
Admission: RE | Admit: 2020-07-06 | Discharge: 2020-07-06 | Disposition: A | Discharge status: Home / Self Care | Payer: Medicare HMO | Source: Ambulatory Visit | Attending: Family Medicine | Admitting: Family Medicine

## 2020-07-06 DIAGNOSIS — Z1231 Encounter for screening mammogram for malignant neoplasm of breast: Secondary | ICD-10-CM | POA: Insufficient documentation

## 2020-07-06 LAB — KAPPA/LAMBDA LIGHT CHAINS
Kappa free light chain: 6.6 mg/L (ref 3.3–19.4)
Kappa, lambda light chain ratio: 1.2 (ref 0.26–1.65)
Lambda free light chains: 5.5 mg/L — ABNORMAL LOW (ref 5.7–26.3)

## 2020-07-06 LAB — PROTEIN ELECTROPHORESIS, SERUM
A/G Ratio: 1.5 (ref 0.7–1.7)
Albumin ELP: 3.5 g/dL (ref 2.9–4.4)
Alpha-1-Globulin: 0.3 g/dL (ref 0.0–0.4)
Alpha-2-Globulin: 0.6 g/dL (ref 0.4–1.0)
Beta Globulin: 1 g/dL (ref 0.7–1.3)
Gamma Globulin: 0.6 g/dL (ref 0.4–1.8)
Globulin, Total: 2.4 g/dL (ref 2.2–3.9)
Total Protein ELP: 5.9 g/dL — ABNORMAL LOW (ref 6.0–8.5)

## 2020-07-07 LAB — IMMUNOFIXATION ELECTROPHORESIS
IgA: 26 mg/dL — ABNORMAL LOW (ref 87–352)
IgG (Immunoglobin G), Serum: 667 mg/dL (ref 586–1602)
IgM (Immunoglobulin M), Srm: 22 mg/dL — ABNORMAL LOW (ref 26–217)
Total Protein ELP: 6 g/dL (ref 6.0–8.5)

## 2020-07-13 ENCOUNTER — Ambulatory Visit (HOSPITAL_COMMUNITY)
Admission: RE | Admit: 2020-07-13 | Discharge: 2020-07-13 | Disposition: A | Discharge status: Home / Self Care | Payer: Medicare HMO | Source: Ambulatory Visit | Attending: Hematology | Admitting: Hematology

## 2020-07-13 ENCOUNTER — Other Ambulatory Visit: Payer: Self-pay

## 2020-07-13 DIAGNOSIS — C9000 Multiple myeloma not having achieved remission: Secondary | ICD-10-CM | POA: Diagnosis not present

## 2020-07-13 MED ORDER — GADOBUTROL 1 MMOL/ML IV SOLN
7.0000 mL | Freq: Once | INTRAVENOUS | Status: AC | PRN
  Administered 2020-07-13: 7 mL via INTRAVENOUS

## 2020-07-20 ENCOUNTER — Other Ambulatory Visit: Payer: Self-pay

## 2020-07-20 ENCOUNTER — Inpatient Hospital Stay (HOSPITAL_COMMUNITY): Payer: Medicare HMO

## 2020-07-20 ENCOUNTER — Inpatient Hospital Stay (HOSPITAL_COMMUNITY): Payer: Medicare HMO | Attending: Hematology | Admitting: Hematology

## 2020-07-20 ENCOUNTER — Encounter (HOSPITAL_COMMUNITY): Payer: Self-pay | Admitting: Lab

## 2020-07-20 ENCOUNTER — Other Ambulatory Visit (HOSPITAL_COMMUNITY): Payer: Self-pay

## 2020-07-20 VITALS — BP 155/86 | HR 97 | Temp 97.3°F | Resp 16 | Wt 153.7 lb

## 2020-07-20 DIAGNOSIS — Z79899 Other long term (current) drug therapy: Secondary | ICD-10-CM | POA: Diagnosis not present

## 2020-07-20 DIAGNOSIS — Z833 Family history of diabetes mellitus: Secondary | ICD-10-CM | POA: Insufficient documentation

## 2020-07-20 DIAGNOSIS — Z818 Family history of other mental and behavioral disorders: Secondary | ICD-10-CM | POA: Insufficient documentation

## 2020-07-20 DIAGNOSIS — Z809 Family history of malignant neoplasm, unspecified: Secondary | ICD-10-CM | POA: Insufficient documentation

## 2020-07-20 DIAGNOSIS — Z8249 Family history of ischemic heart disease and other diseases of the circulatory system: Secondary | ICD-10-CM | POA: Insufficient documentation

## 2020-07-20 DIAGNOSIS — M40209 Unspecified kyphosis, site unspecified: Secondary | ICD-10-CM | POA: Diagnosis not present

## 2020-07-20 DIAGNOSIS — G479 Sleep disorder, unspecified: Secondary | ICD-10-CM | POA: Insufficient documentation

## 2020-07-20 DIAGNOSIS — M546 Pain in thoracic spine: Secondary | ICD-10-CM | POA: Insufficient documentation

## 2020-07-20 DIAGNOSIS — R5383 Other fatigue: Secondary | ICD-10-CM | POA: Insufficient documentation

## 2020-07-20 DIAGNOSIS — C9000 Multiple myeloma not having achieved remission: Secondary | ICD-10-CM | POA: Diagnosis not present

## 2020-07-20 DIAGNOSIS — G629 Polyneuropathy, unspecified: Secondary | ICD-10-CM | POA: Insufficient documentation

## 2020-07-20 DIAGNOSIS — Z9481 Bone marrow transplant status: Secondary | ICD-10-CM | POA: Insufficient documentation

## 2020-07-20 DIAGNOSIS — Z8379 Family history of other diseases of the digestive system: Secondary | ICD-10-CM | POA: Diagnosis not present

## 2020-07-20 DIAGNOSIS — Q892 Congenital malformations of other endocrine glands: Secondary | ICD-10-CM | POA: Insufficient documentation

## 2020-07-20 DIAGNOSIS — R748 Abnormal levels of other serum enzymes: Secondary | ICD-10-CM | POA: Insufficient documentation

## 2020-07-20 LAB — MAGNESIUM: Magnesium: 1.8 mg/dL (ref 1.7–2.4)

## 2020-07-20 LAB — CBC WITH DIFFERENTIAL/PLATELET
Abs Immature Granulocytes: 0 10*3/uL (ref 0.00–0.07)
Basophils Absolute: 0 10*3/uL (ref 0.0–0.1)
Basophils Relative: 1 %
Eosinophils Absolute: 0.2 10*3/uL (ref 0.0–0.5)
Eosinophils Relative: 5 %
HCT: 34 % — ABNORMAL LOW (ref 36.0–46.0)
Hemoglobin: 10.9 g/dL — ABNORMAL LOW (ref 12.0–15.0)
Immature Granulocytes: 0 %
Lymphocytes Relative: 26 %
Lymphs Abs: 1 10*3/uL (ref 0.7–4.0)
MCH: 32 pg (ref 26.0–34.0)
MCHC: 32.1 g/dL (ref 30.0–36.0)
MCV: 99.7 fL (ref 80.0–100.0)
Monocytes Absolute: 0.5 10*3/uL (ref 0.1–1.0)
Monocytes Relative: 13 %
Neutro Abs: 2.1 10*3/uL (ref 1.7–7.7)
Neutrophils Relative %: 55 %
Platelets: 169 10*3/uL (ref 150–400)
RBC: 3.41 MIL/uL — ABNORMAL LOW (ref 3.87–5.11)
RDW: 12.3 % (ref 11.5–15.5)
WBC: 3.8 10*3/uL — ABNORMAL LOW (ref 4.0–10.5)
nRBC: 0 % (ref 0.0–0.2)

## 2020-07-20 LAB — COMPREHENSIVE METABOLIC PANEL
ALT: 33 U/L (ref 0–44)
AST: 45 U/L — ABNORMAL HIGH (ref 15–41)
Albumin: 3.8 g/dL (ref 3.5–5.0)
Alkaline Phosphatase: 164 U/L — ABNORMAL HIGH (ref 38–126)
Anion gap: 9 (ref 5–15)
BUN: 13 mg/dL (ref 8–23)
CO2: 25 mmol/L (ref 22–32)
Calcium: 9 mg/dL (ref 8.9–10.3)
Chloride: 105 mmol/L (ref 98–111)
Creatinine, Ser: 0.71 mg/dL (ref 0.44–1.00)
GFR, Estimated: >60 mL/min (ref >60)
Glucose, Bld: 98 mg/dL (ref 70–99)
Potassium: 3.6 mmol/L (ref 3.5–5.1)
Sodium: 139 mmol/L (ref 135–145)
Total Bilirubin: 0.8 mg/dL (ref 0.3–1.2)
Total Protein: 6.5 g/dL (ref 6.5–8.1)

## 2020-07-20 MED ORDER — LENALIDOMIDE 10 MG PO CAPS: 10.0000 mg | ORAL_CAPSULE | Freq: Every day | ORAL | 0 refills | Status: DC

## 2020-07-20 NOTE — Progress Notes (Signed)
Stephanie Galvan, [place] [address]   CLINIC:  Medical Oncology/Hematology  PCP:  [name], MD [place] / [place] [address]  [phone]  REASON FOR VISIT:  Follow-up for multiple myeloma  PRIOR THERAPY:  1. RVD x 6 cycles from 08/19/2019 to 12/15/2019. 2. 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  CURRENT THERAPY: Maintenance Revlimid daily to begin  INTERVAL HISTORY:  [name], a 64 y.o. female, returns for routine follow-up for her multiple myeloma. Stephanie Galvan was last seen on 07/05/2020.  Today she is accompanied by her daughter and she reports feeling okay. She continues complaining of back pain which is almost constant though not as bad at night. She reports having decreased appetite with Revlimid, but tolerated the Zometa well. Her energy levels are improving and the numbness in her feet is stable.   REVIEW OF SYSTEMS:  Review of Systems  Constitutional: Positive for appetite change (80%) and fatigue (80%).  Musculoskeletal: Positive for back pain.  Neurological: Positive for numbness (feet).  Psychiatric/Behavioral: Positive for sleep disturbance.  All other systems reviewed and are negative.   PAST MEDICAL/SURGICAL HISTORY:  Past Medical History:  Diagnosis Date  . Breast cancer ([facility]) 2006   left breast  . Breast mass, right   . Compression fracture of cervical spine ([facility]) 06/18/2019   recent Chest xray stating had fx.  . High cholesterol   . History of bladder infections   . Hypertension   . Rash Aug 2013   Past Surgical History:  Procedure Laterality Date  . ABDOMINAL HYSTERECTOMY     just uterus removed  . BREAST BIOPSY    . BREAST LUMPECTOMY WITH RADIOACTIVE SEED LOCALIZATION Right 07/24/2018   Procedure: RIGHT BREAST LUMPECTOMY WITH RADIOACTIVE SEED LOCALIZATION;  Surgeon: Coralie Keens, MD;  Location: [facility];  Service: General;  Laterality: Right;  . MASTECTOMY  5/06   left  .  PARTIAL HYSTERECTOMY    . PORT-A-CATH REMOVAL    . PORTACATH PLACEMENT      SOCIAL HISTORY:  Social History   Socioeconomic History  . Marital status: Married    Spouse name: Not on file  . Number of children: 2  . Years of education: Not on file  . Highest education level: Not on file  Occupational History    Employer: [employer]  Tobacco Use  . Smoking status: Never Smoker  . Smokeless tobacco: Never Used  Vaping Use  . Vaping Use: Never used  Substance and Sexual Activity  . Alcohol use: No  . Drug use: No  . Sexual activity: Yes    Birth control/protection: Surgical  Other Topics Concern  . Not on file  Social History Narrative  . Not on file   Social Determinants of Health   Financial Resource Strain: Low Risk   . Difficulty of Paying Living Expenses: Not hard at all  Food Insecurity: No Food Insecurity  . Worried About Charity fundraiser in the Last Year: Never true  . Ran Out of Food in the Last Year: Never true  Transportation Needs: No Transportation Needs  . Lack of Transportation (Medical): No  . Lack of Transportation (Non-Medical): No  Physical Activity: Inactive  . Days of Exercise per Week: 0 days  . Minutes of Exercise per Session: 0 min  Stress: No Stress Concern Present  . Feeling of Stress : Not at all  Social Connections: Moderately Integrated  . Frequency of Communication with Friends and  Family: More than three times a week  . Frequency of Social Gatherings with Friends and Family: Never  . Attends Religious Services: More than 4 times per year  . Active Member of Clubs or Organizations: No  . Attends Archivist Meetings: Never  . Marital Status: Married  Human resources officer Violence: Not At Risk  . Fear of Current or Ex-Partner: No  . Emotionally Abused: No  . Physically Abused: No  . Sexually Abused: No    FAMILY HISTORY:  Family History  Problem Relation Age of Onset  . Dementia Mother   . Stomach cancer Father   .  Cancer Brother   . Cancer Brother   . Diabetes Brother   . Colitis Daughter   . Hypertension Daughter   . Hypertension Daughter     CURRENT MEDICATIONS:  Current Outpatient Medications  Medication Sig Dispense Refill  . acyclovir (ZOVIRAX) 400 MG tablet Take 1 tablet (400 mg total) by mouth 2 (two) times daily. 180 tablet 1  . calcium carbonate (TUMS - DOSED IN MG ELEMENTAL CALCIUM) 500 MG chewable tablet Chew 2 tablets by mouth 2 (two) times daily.    . ergocalciferol (VITAMIN D2) 1.25 MG (50000 UT) capsule Take 1 capsule (50,000 Units total) by mouth once a week. 12 capsule 1  . fluticasone (FLOVENT HFA) 220 MCG/ACT inhaler Inhale 1 puff into the lungs daily as needed. 1 each 3  . furosemide (LASIX) 20 MG tablet Take 1 tablet (20 mg total) by mouth daily as needed for fluid. 30 tablet 1  . HYDROcodone-acetaminophen (NORCO) 10-325 MG tablet Take 1 tablet by mouth 2 (two) times daily as needed for moderate pain. 45 tablet 0  . potassium chloride (KLOR-CON) 10 MEQ tablet TAKE 2 TABLETS (20 MEQ TOTAL) BY MOUTH 3 (THREE) TIMES DAILY. 540 tablet 1  . simethicone (MYLICON) 80 MG chewable tablet Chew by mouth.    . lenalidomide (REVLIMID) 10 MG capsule Take 1 capsule (10 mg total) by mouth daily. [employer] Auth # [id number] Date Obtained 07/20/2020 28 capsule 0  . omeprazole (PRILOSEC) 40 MG capsule Take by mouth.    . ondansetron (ZOFRAN) 8 MG tablet Take by mouth.  (Patient not taking: Reported on 07/20/2020)    . prochlorperazine (COMPAZINE) 10 MG tablet Take 1 tablet (10 mg total) by mouth every 6 (six) hours as needed for nausea or vomiting. (Patient not taking: Reported on 07/20/2020) 45 tablet 2   No current facility-administered medications for this visit.    ALLERGIES:  No Known Allergies  PHYSICAL EXAM:  Performance status (ECOG): 1 - Symptomatic but completely ambulatory  Vitals:   07/20/20 0932  BP: (!) 155/86  Pulse: 97  Resp: 16  Temp: (!) 97.3 F (36.3 C)  SpO2: 100%    Wt Readings from Last 3 Encounters:  07/20/20 153 lb 10.6 oz (69.7 kg)  07/05/20 153 lb 3.2 oz (69.5 kg)  06/06/20 149 lb (67.6 kg)   Physical Exam  LABORATORY DATA:  I have reviewed the labs as listed.  CBC Latest Ref Rng & Units 07/20/2020 07/05/2020 06/13/2020  WBC 4.0 - 10.5 K/uL 3.8(L) 4.7 5.6  Hemoglobin 12.0 - 15.0 g/dL 10.9(L) 11.7(L) 11.0(L)  Hematocrit 36 - 46 % 34.0(L) 36.3 34.4(L)  Platelets 150 - 400 K/uL 169 156 201   CMP Latest Ref Rng & Units 07/20/2020 07/05/2020 06/13/2020  Glucose 70 - 99 mg/dL 98 70 90  BUN 8 - 23 mg/dL 13 15 13   Creatinine 0.44 - 1.00 mg/dL  0.71 0.71 0.68  Sodium 135 - 145 mmol/L 139 141 138  Potassium 3.5 - 5.1 mmol/L 3.6 3.5 4.2  Chloride 98 - 111 mmol/L 105 109 104  CO2 22 - 32 mmol/L 25 26 25   Calcium 8.9 - 10.3 mg/dL 9.0 9.5 9.3  Total Protein 6.5 - 8.1 g/dL 6.5 6.6 6.1(L)  Total Bilirubin 0.3 - 1.2 mg/dL 0.8 0.7 0.6  Alkaline Phos 38 - 126 U/L 164(H) 125 128(H)  AST 15 - 41 U/L 45(H) 23 24  ALT 0 - 44 U/L 33 18 15      Component Value Date/Time   RBC 3.41 (L) 07/20/2020 0905   MCV 99.7 07/20/2020 0905   MCH 32.0 07/20/2020 0905   MCHC 32.1 07/20/2020 0905   RDW 12.3 07/20/2020 0905   LYMPHSABS 1.0 07/20/2020 0905   MONOABS 0.5 07/20/2020 0905   EOSABS 0.2 07/20/2020 0905   BASOSABS 0.0 07/20/2020 0905   Lab Results  Component Value Date   TOTALPROTELP 5.9 (L) 07/05/2020   TOTALPROTELP 6.0 07/05/2020   ALBUMINELP 3.5 07/05/2020   A1GS 0.3 07/05/2020   A2GS 0.6 07/05/2020   BETS 1.0 07/05/2020   GAMS 0.6 07/05/2020   MSPIKE Not Observed 07/05/2020   SPEI Comment 07/05/2020    Lab Results  Component Value Date   KPAFRELGTCHN 6.6 07/05/2020   LAMBDASER 5.5 (L) 07/05/2020   KAPLAMBRATIO 1.20 07/05/2020    DIAGNOSTIC IMAGING:  I have independently reviewed the scans and discussed with the patient. MR Thoracic Spine W Wo Contrast  Result Date: 07/13/2020 CLINICAL DATA:  Mid back pain. History of bone marrow  transplant and spine fractures. EXAM: MRI THORACIC WITHOUT AND WITH CONTRAST TECHNIQUE: Multiplanar and multiecho pulse sequences of the thoracic spine were obtained without and with intravenous contrast. CONTRAST:  40m GADAVIST GADOBUTROL 1 MMOL/ML IV SOLN COMPARISON:  None. FINDINGS: MRI THORACIC SPINE FINDINGS Alignment:  Kyphosis and mild levoscoliosis. Vertebrae: Remote T7, T8, T12, L1, and L2 compression/superior endplate fractures. Height loss is greatest and moderate at T7 and T8. No retropulsion. No visible underlying bone lesion. Enhancement at the level of Schmorl's nodes seen at T7, T12, and L1. Cord: Intermittent visible central canal, within normal limits. No cord edema or abnormal intrathecal enhancement Paraspinal and other soft tissues: Curvilinear T1 hyperintense mass posterior to the hyoid and anterior to the thyroid cartilage, 3.6 cm craniocaudal and 9 mm in thickness, consistent with thyroglossal duct cyst. Disc levels: In general the disc spaces remain well hydrated. Only mild facet spurring at the lower thoracic levels. No degenerative impingement. IMPRESSION: 1. Kyphosis and scoliosis with multiple remote compression fractures described above. 2. No acute compression fracture.  No evidence of bone lesion. 3. Incidental thyroglossal duct cyst. Electronically Signed   By: JMonte FantasiaM.D.   On: 07/13/2020 10:37   MM 3D SCREEN BREAST UNI RIGHT  Result Date: 07/07/2020 CLINICAL DATA:  Screening. EXAM: DIGITAL SCREENING UNILATERAL RIGHT MAMMOGRAM WITH CAD AND TOMO COMPARISON:  Previous exam(s). ACR Breast Density Category b: There are scattered areas of fibroglandular density. FINDINGS: There are no findings suspicious for malignancy. Images were processed with CAD. IMPRESSION: No mammographic evidence of malignancy. A result letter of this screening mammogram will be mailed directly to the patient. RECOMMENDATION: Screening mammogram in one year. (Code:SM-B-01Y) BI-RADS CATEGORY  1:  Negative. Electronically Signed   By: WAbelardo DieselM.D.   On: 07/07/2020 14:33     ASSESSMENT:  1.Stage II (standard risk) IgA lambda plasma cell myeloma: -5 cycles of  RVD from 08/19/2019 through 11/24/2019. Revlimid started on 10/06/2019 due to delay. -Velcade held since 12/15/2019 due to gastric symptoms, which have resolved completely. -She was evaluated by Dr. Jannifer Franklin. Recommended to have immediate bone marrow transplant. -She has compression fractures of T8, T10 and T12. Also L1 and L3 compression fractures. -Revlimid and dexamethasone continued until 03/22/2020. 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  2.  Upper back pain: -MRI on 07/13/2020 shows T7, T8, T12, L1 and L2 compression/superior endplate fractures.  Height loss is greatest and moderate at T7 and T8.  No evidence of bone lesions.   PLAN:  1. IgA lambda plasma cell myeloma: -I have recommended Revlimid maintenance 10 mg continuously starting 1 December. -We reviewed myeloma labs from 07/05/2020 which showed normal SPEP, immunofixation and free light chains.  Alk phos is elevated secondary to bone lesions. -Her energy levels are improving. -I will see her back and 4 weeks after start of Revlimid.  2.  Upper back pain: -She is complaining of upper back pain pronounced in the last few weeks. -We reviewed results of the MRI which showed moderate height loss at T7 and T8. -I will make a referral to interventional radiology to see if vertebral augmentation helps with this pain.  3. Bone protection: -I plan to start her back on Zometa monthly in December.  4.  ID prophylaxis: -Continue acyclovir twice daily.  Continue aspirin 81 mg for thromboprophylaxis.  5. Neuropathy: -Continue gabapentin 300 mg at bedtime.  Orders placed this encounter:  No orders of the defined types were placed in this encounter.    Derek Jack, MD [place] [phone]   I, Milinda Antis, am acting as a scribe for Dr. Sanda Linger.  I, Derek Jack MD, have reviewed the above documentation for accuracy and completeness, and I agree with the above.

## 2020-07-20 NOTE — Progress Notes (Unsigned)
Referral sent to IR clinic for Dr Pascal Lux.  Records faxed

## 2020-07-20 NOTE — Patient Instructions (Signed)
[place] at [facility] Discharge Instructions  You were seen today by Dr. Delton Coombes. He went over your recent results and scans. You will be started on monthly Zometa on December 1st and Revlimid on December 2nd to be taken daily. Dr. Delton Coombes will see you back in 8 weeks for labs and follow up.   Thank you for choosing [facility] at [facility] to provide your oncology and hematology care.  To afford each patient quality time with our provider, please arrive at least 15 minutes before your scheduled appointment time.   If you have a lab appointment with the [facility] please come in thru the Main Entrance and check in at the main information desk  You need to re-schedule your appointment should you arrive 10 or more minutes late.  We strive to give you quality time with our providers, and arriving late affects you and other patients whose appointments are after yours.  Also, if you no show three or more times for appointments you may be dismissed from the clinic at the providers discretion.     Again, thank you for choosing [facility].  Our hope is that these requests will decrease the amount of time that you wait before being seen by our physicians.       _____________________________________________________________  Should you have questions after your visit to [facility], please contact our office at [phone] between the hours of 8:00 a.m. and 4:30 p.m.  Voicemails left after 4:00 p.m. will not be returned until the following business day.  For prescription refill requests, have your pharmacy contact our office and allow 72 hours.    Cancer Center Support Programs:   > Cancer Support Group  2nd Tuesday of the month 1pm-2pm, Journey Room

## 2020-07-20 NOTE — Telephone Encounter (Signed)
Revlimid 10mg  daily order placed per Dr. Delton Coombes and faxed to [facility].

## 2020-07-21 ENCOUNTER — Other Ambulatory Visit: Payer: Self-pay | Admitting: Hematology

## 2020-07-21 DIAGNOSIS — S32000A Wedge compression fracture of unspecified lumbar vertebra, initial encounter for closed fracture: Secondary | ICD-10-CM

## 2020-07-21 DIAGNOSIS — S22000A Wedge compression fracture of unspecified thoracic vertebra, initial encounter for closed fracture: Secondary | ICD-10-CM

## 2020-07-26 ENCOUNTER — Encounter (HOSPITAL_COMMUNITY): Payer: Self-pay

## 2020-07-26 ENCOUNTER — Other Ambulatory Visit (HOSPITAL_COMMUNITY): Payer: Self-pay | Admitting: Surgery

## 2020-07-26 DIAGNOSIS — C9000 Multiple myeloma not having achieved remission: Secondary | ICD-10-CM

## 2020-07-26 MED ORDER — FUROSEMIDE 20 MG PO TABS: 20.0000 mg | ORAL_TABLET | Freq: Every day | ORAL | 6 refills | Status: DC | PRN

## 2020-07-26 MED ORDER — FLUTICASONE PROPIONATE HFA 220 MCG/ACT IN AERO: 1.0000 | INHALATION_SPRAY | Freq: Every day | RESPIRATORY_TRACT | 3 refills | Status: DC | PRN

## 2020-07-26 NOTE — Progress Notes (Signed)
Mandy with [facility] called to verify Revlimid prescription. I verified dosing and administration instructions as prescribed by Dr. Delton Coombes.

## 2020-07-27 ENCOUNTER — Other Ambulatory Visit (HOSPITAL_COMMUNITY): Payer: Self-pay | Admitting: *Deleted

## 2020-07-27 DIAGNOSIS — C9000 Multiple myeloma not having achieved remission: Secondary | ICD-10-CM

## 2020-07-27 MED ORDER — FLUTICASONE PROPIONATE HFA 220 MCG/ACT IN AERO: 1.0000 | INHALATION_SPRAY | Freq: Every day | RESPIRATORY_TRACT | 3 refills | Status: DC | PRN

## 2020-07-28 ENCOUNTER — Encounter: Payer: Self-pay | Admitting: *Deleted

## 2020-07-28 ENCOUNTER — Ambulatory Visit
Admission: RE | Admit: 2020-07-28 | Discharge: 2020-07-28 | Disposition: A | Discharge status: Home / Self Care | Payer: Medicare HMO | Source: Ambulatory Visit | Attending: Hematology | Admitting: Hematology

## 2020-07-28 DIAGNOSIS — S22000A Wedge compression fracture of unspecified thoracic vertebra, initial encounter for closed fracture: Secondary | ICD-10-CM

## 2020-07-28 DIAGNOSIS — S32000A Wedge compression fracture of unspecified lumbar vertebra, initial encounter for closed fracture: Secondary | ICD-10-CM

## 2020-07-28 HISTORY — PX: IR RADIOLOGIST EVAL & MGMT: IMG5224

## 2020-07-28 NOTE — Consult Note (Signed)
Chief Complaint: Back Pain  Referring Physician(s): Katragadda,Sreedhar  History of Present Illness: Stephanie Galvan is a 64 y.o. female presenting as a scheduled consultation to [facility], kindly referred by Dr. Delton Coombes, for evaluation of back pain and possible candidacy for vertebral augmentation.   Stephanie Galvan is here today with her husband for our evaluation.  She tells me that she noticed her back pain starting in the summer of 2020, sometime around July, and has steadily gotten worse.  She did not have a fall or any other trauma.  She did, since last summer, receive the diagnosis of multiple myeloma, and initiated treatment.  Because of this, her back pain was not worked up.    Recently however, she says the ongoing discomfort prompted her to inquire about work-up and MRI thoracic spine was completed 07/13/20.    The results show no acute compression fracture.  She has chronic compression deformity of multiple levels, including T7, T8, T12, L1, L2.  She has enhancing Schmorl's nodes at T7 L1, L2.   She tells me that at its worst, her pain in 9/10 intensity, described as "pressure" quality.  This in not present every day, but worst when she is doing activities.  She takes hydrocodone a few times per week, which seems to help.  She feels woozy when she takes it, but that will go away quickly.  She denies any constipation problems. Pain does not wake her at night.   Prior surgery includes hysterectomy and left mastectomy.   Past Medical History:  Diagnosis Date  . Breast cancer ([place]) 2006   left breast  . Breast mass, right   . Compression fracture of cervical spine ([facility]) 06/18/2019   recent Chest xray stating had fx.  . High cholesterol   . History of bladder infections   . Hypertension   . Rash Aug 2013    Past Surgical History:  Procedure Laterality Date  . ABDOMINAL HYSTERECTOMY     just uterus removed  . BREAST BIOPSY    . BREAST LUMPECTOMY WITH RADIOACTIVE  SEED LOCALIZATION Right 07/24/2018   Procedure: RIGHT BREAST LUMPECTOMY WITH RADIOACTIVE SEED LOCALIZATION;  Surgeon: Coralie Keens, MD;  Location: [facility];  Service: General;  Laterality: Right;  . IR RADIOLOGIST EVAL & MGMT  07/28/2020  . MASTECTOMY  5/06   left  . PARTIAL HYSTERECTOMY    . PORT-A-CATH REMOVAL    . PORTACATH PLACEMENT      Allergies: Patient has no known allergies.  Medications: Prior to Admission medications   Medication Sig Start Date End Date Taking? Authorizing Provider  acyclovir (ZOVIRAX) 400 MG tablet Take 1 tablet (400 mg total) by mouth 2 (two) times daily. 04/26/20   Derek Jack, MD  calcium carbonate (TUMS - DOSED IN MG ELEMENTAL CALCIUM) 500 MG chewable tablet Chew 2 tablets by mouth 2 (two) times daily.    [provider]  ergocalciferol (VITAMIN D2) 1.25 MG (50000 UT) capsule Take 1 capsule (50,000 Units total) by mouth once a week. 06/06/20   Derek Jack, MD  fluticasone (FLOVENT HFA) 220 MCG/ACT inhaler Inhale 1 puff into the lungs daily as needed. 07/27/20 09/25/20  Derek Jack, MD  furosemide (LASIX) 20 MG tablet Take 1 tablet (20 mg total) by mouth daily as needed for fluid. 07/26/20   Derek Jack, MD  HYDROcodone-acetaminophen (NORCO) 10-325 MG tablet Take 1 tablet by mouth 2 (two) times daily as needed for moderate pain. 07/05/20   Derek Jack, MD  lenalidomide (REVLIMID) 10 MG capsule Take 1 capsule (10 mg total) by mouth daily. [employer] Auth # [id number] Date Obtained 07/20/2020 07/20/20   Derek Jack, MD  omeprazole (PRILOSEC) 40 MG capsule Take by mouth. 05/03/20 07/02/20  [provider]  ondansetron (ZOFRAN) 8 MG tablet Take by mouth.  Patient not taking: Reported on 07/20/2020 04/27/20   [provider]  potassium chloride (KLOR-CON) 10 MEQ tablet TAKE 2 TABLETS (20 MEQ TOTAL) BY MOUTH 3 (THREE) TIMES DAILY. 06/13/20   Derek Jack, MD    prochlorperazine (COMPAZINE) 10 MG tablet Take 1 tablet (10 mg total) by mouth every 6 (six) hours as needed for nausea or vomiting. Patient not taking: Reported on 07/20/2020 08/19/19   Derek Jack, MD  simethicone [facility]) 80 MG chewable tablet Chew by mouth.    [provider]     Family History  Problem Relation Age of Onset  . Dementia Mother   . Stomach cancer Father   . Cancer Brother   . Cancer Brother   . Diabetes Brother   . Colitis Daughter   . Hypertension Daughter   . Hypertension Daughter     Social History   Socioeconomic History  . Marital status: Married    Spouse name: Not on file  . Number of children: 2  . Years of education: Not on file  . Highest education level: Not on file  Occupational History    Employer: [employer]  Tobacco Use  . Smoking status: Never Smoker  . Smokeless tobacco: Never Used  Vaping Use  . Vaping Use: Never used  Substance and Sexual Activity  . Alcohol use: No  . Drug use: No  . Sexual activity: Yes    Birth control/protection: Surgical  Other Topics Concern  . Not on file  Social History Narrative  . Not on file   Social Determinants of Health   Financial Resource Strain: Low Risk   . Difficulty of Paying Living Expenses: Not hard at all  Food Insecurity: No Food Insecurity  . Worried About Charity fundraiser in the Last Year: Never true  . Ran Out of Food in the Last Year: Never true  Transportation Needs: No Transportation Needs  . Lack of Transportation (Medical): No  . Lack of Transportation (Non-Medical): No  Physical Activity: Inactive  . Days of Exercise per Week: 0 days  . Minutes of Exercise per Session: 0 min  Stress: No Stress Concern Present  . Feeling of Stress : Not at all  Social Connections: Moderately Integrated  . Frequency of Communication with Friends and Family: More than three times a week  . Frequency of Social Gatherings with Friends and Family: Never  . Attends  Religious Services: More than 4 times per year  . Active Member of Clubs or Organizations: No  . Attends Archivist Meetings: Never  . Marital Status: Married       Review of Systems: A 12 point ROS discussed and pertinent positives are indicated in the HPI above.  All other systems are negative.  Review of Systems  Vital Signs: There were no vitals taken for this visit.  Physical Exam General: 64 yo female appearing stated age.  Well-developed, well-nourished.  No distress. HEENT: Atraumatic, normocephalic.  Conjugate gaze, extra-ocular motor intact. No scleral icterus or scleral injection. No lesions on external ears, nose, lips, or gums.  Oral mucosa moist, pink.  Neck: Symmetric with no goiter enlargement.  Chest/Lungs:  Symmetric chest with inspiration/expiration.  No labored breathing.  Clear to auscultation with no wheezes, rhonchi, or rales.  Heart:  RRR, with no third heart sounds appreciated. No JVD appreciated.  Abdomen:  Soft, NT/ND, with + bowel sounds.   Genito-urinary: Deferred Neurologic: Alert & Oriented to person, place, and time.   Normal affect and insight.  Appropriate questions.  Moving all 4 extremities with gross sensory intact.  MSK:  No step off deformity.  She has no reproducible pain in the thoracic or upper lumbar region of the back, in particular with manipulation of the SP's.     Imaging: MR Thoracic Spine W Wo Contrast  Result Date: 07/13/2020 CLINICAL DATA:  Mid back pain. History of bone marrow transplant and spine fractures. EXAM: MRI THORACIC WITHOUT AND WITH CONTRAST TECHNIQUE: Multiplanar and multiecho pulse sequences of the thoracic spine were obtained without and with intravenous contrast. CONTRAST:  64m GADAVIST GADOBUTROL 1 MMOL/ML IV SOLN COMPARISON:  None. FINDINGS: MRI THORACIC SPINE FINDINGS Alignment:  Kyphosis and mild levoscoliosis. Vertebrae: Remote T7, T8, T12, L1, and L2 compression/superior endplate fractures. Height loss is  greatest and moderate at T7 and T8. No retropulsion. No visible underlying bone lesion. Enhancement at the level of Schmorl's nodes seen at T7, T12, and L1. Cord: Intermittent visible central canal, within normal limits. No cord edema or abnormal intrathecal enhancement Paraspinal and other soft tissues: Curvilinear T1 hyperintense mass posterior to the hyoid and anterior to the thyroid cartilage, 3.6 cm craniocaudal and 9 mm in thickness, consistent with thyroglossal duct cyst. Disc levels: In general the disc spaces remain well hydrated. Only mild facet spurring at the lower thoracic levels. No degenerative impingement. IMPRESSION: 1. Kyphosis and scoliosis with multiple remote compression fractures described above. 2. No acute compression fracture.  No evidence of bone lesion. 3. Incidental thyroglossal duct cyst. Electronically Signed   By: JMonte FantasiaM.D.   On: 07/13/2020 10:37   MM 3D SCREEN BREAST UNI RIGHT  Result Date: 07/07/2020 CLINICAL DATA:  Screening. EXAM: DIGITAL SCREENING UNILATERAL RIGHT MAMMOGRAM WITH CAD AND TOMO COMPARISON:  Previous exam(s). ACR Breast Density Category b: There are scattered areas of fibroglandular density. FINDINGS: There are no findings suspicious for malignancy. Images were processed with CAD. IMPRESSION: No mammographic evidence of malignancy. A result letter of this screening mammogram will be mailed directly to the patient. RECOMMENDATION: Screening mammogram in one year. (Code:SM-B-01Y) BI-RADS CATEGORY  1: Negative. Electronically Signed   By: WAbelardo DieselM.D.   On: 07/07/2020 14:33   IR Radiologist Eval & Mgmt  Result Date: 07/28/2020 Please refer to notes tab for details about interventional procedure. (Op Note)   Labs:  CBC: Recent Labs    06/06/20 1209 06/13/20 1127 07/05/20 0948 07/20/20 0905  WBC 5.3 5.6 4.7 3.8*  HGB 10.9* 11.0* 11.7* 10.9*  HCT 33.7* 34.4* 36.3 34.0*  PLT 231 201 156 169    COAGS: No results for input(s): INR,  APTT in the last 8760 hours.  BMP: Recent Labs    05/23/20 1241 05/23/20 1241 05/30/20 1027 05/30/20 1027 06/06/20 1209 06/13/20 1127 07/05/20 0948 07/20/20 0905  NA 138   < > 138   < > 139 138 141 139  K 4.5   < > 3.9   < > 4.0 4.2 3.5 3.6  CL 105   < > 104   < > 106 104 109 105  CO2 24   < > 26   < > 25 25 26 25   GLUCOSE 122*   < >  125*   < > 97 90 70 98  BUN 9   < > 8   < > 9 13 15 13   CALCIUM 9.3   < > 9.6   < > 9.1 9.3 9.5 9.0  CREATININE 0.71   < > 0.75   < > 0.68 0.68 0.71 0.71  GFRNONAA >60   < > >60   < > >60 >60 >60 >60  GFRAA >60  --  >60  --  >60 >60  --   --    < > = values in this interval not displayed.    LIVER FUNCTION TESTS: Recent Labs    06/06/20 1209 06/13/20 1127 07/05/20 0948 07/20/20 0905  BILITOT 0.5 0.6 0.7 0.8  AST 24 24 23  45*  ALT 17 15 18  33  ALKPHOS 147* 128* 125 164*  PROT 6.4* 6.1* 6.6 6.5  ALBUMIN 3.5 3.8 4.0 3.8    TUMOR MARKERS: No results for input(s): AFPTM, CEA, CA199, CHROMGRNA in the last 8760 hours.  Assessment and Plan:  Stephanie Galvan is a very pleasant 64 yo female with back pain of about 1 year, without corresponding compression fracture on MRI.    She does have changes of Schmorl's nodes on the MRI.    I had a long discussion with Stephanie Galvan and her husband regarding the anatomy, pathology, pathophysiology of compression fractures, and the treatment strategies that might include conservative management versus vertebral augmentation.    I also was specific that she has no acute compression fracture on MRI, and there is no indication for vertebral augmentation for chronic deformity.   I did share with them that there is weak evidence to support treatment of symptomatic schmorl's nodes, however, I believe this is only reasonable if there is reproducible pain to the levels of imaging findings, and I cannot elicit any pain response with physical exam.  I did let them know that in my opinion the risk of performing vertebral  augmentation for only the imaging findings would outweigh any theoretic benefit.    I answered all of their questions.   Plan: - Continue conservative measures - I would defer vertebral augmentation in the absence of compression fracture on MRI, as above.   Thank you for this interesting consult.  I greatly enjoyed meeting Stephanie Galvan and look forward to participating in their care.  A copy of this report was sent to the requesting provider on this date.  Electronically Signed: Corrie Mckusick 07/28/2020, 12:31 PM   I spent a total of  40 Minutes   in face to face in clinical consultation, greater than 50% of which was counseling/coordinating care for back pain, possible vertebral augmentation.

## 2020-08-10 ENCOUNTER — Inpatient Hospital Stay (HOSPITAL_COMMUNITY): Payer: Medicare HMO | Attending: Hematology

## 2020-08-10 ENCOUNTER — Other Ambulatory Visit: Payer: Self-pay

## 2020-08-10 ENCOUNTER — Inpatient Hospital Stay (HOSPITAL_COMMUNITY): Payer: Medicare HMO

## 2020-08-10 VITALS — BP 149/86 | HR 93 | Temp 96.8°F | Resp 18 | Wt 154.8 lb

## 2020-08-10 DIAGNOSIS — Z8249 Family history of ischemic heart disease and other diseases of the circulatory system: Secondary | ICD-10-CM | POA: Diagnosis not present

## 2020-08-10 DIAGNOSIS — G479 Sleep disorder, unspecified: Secondary | ICD-10-CM | POA: Insufficient documentation

## 2020-08-10 DIAGNOSIS — G629 Polyneuropathy, unspecified: Secondary | ICD-10-CM | POA: Diagnosis not present

## 2020-08-10 DIAGNOSIS — Z79899 Other long term (current) drug therapy: Secondary | ICD-10-CM | POA: Diagnosis not present

## 2020-08-10 DIAGNOSIS — R630 Anorexia: Secondary | ICD-10-CM | POA: Insufficient documentation

## 2020-08-10 DIAGNOSIS — R200 Anesthesia of skin: Secondary | ICD-10-CM | POA: Insufficient documentation

## 2020-08-10 DIAGNOSIS — Z8379 Family history of other diseases of the digestive system: Secondary | ICD-10-CM | POA: Diagnosis not present

## 2020-08-10 DIAGNOSIS — M549 Dorsalgia, unspecified: Secondary | ICD-10-CM | POA: Insufficient documentation

## 2020-08-10 DIAGNOSIS — Z833 Family history of diabetes mellitus: Secondary | ICD-10-CM | POA: Insufficient documentation

## 2020-08-10 DIAGNOSIS — Z800 Family history of malignant neoplasm of digestive organs: Secondary | ICD-10-CM | POA: Diagnosis not present

## 2020-08-10 DIAGNOSIS — Z809 Family history of malignant neoplasm, unspecified: Secondary | ICD-10-CM | POA: Insufficient documentation

## 2020-08-10 DIAGNOSIS — Z818 Family history of other mental and behavioral disorders: Secondary | ICD-10-CM | POA: Diagnosis not present

## 2020-08-10 DIAGNOSIS — C9000 Multiple myeloma not having achieved remission: Secondary | ICD-10-CM | POA: Insufficient documentation

## 2020-08-10 LAB — CBC WITH DIFFERENTIAL/PLATELET
Abs Immature Granulocytes: 0.01 10*3/uL (ref 0.00–0.07)
Basophils Absolute: 0 10*3/uL (ref 0.0–0.1)
Basophils Relative: 1 %
Eosinophils Absolute: 0.2 10*3/uL (ref 0.0–0.5)
Eosinophils Relative: 5 %
HCT: 35.1 % — ABNORMAL LOW (ref 36.0–46.0)
Hemoglobin: 11.3 g/dL — ABNORMAL LOW (ref 12.0–15.0)
Immature Granulocytes: 0 %
Lymphocytes Relative: 32 %
Lymphs Abs: 1.3 10*3/uL (ref 0.7–4.0)
MCH: 31.3 pg (ref 26.0–34.0)
MCHC: 32.2 g/dL (ref 30.0–36.0)
MCV: 97.2 fL (ref 80.0–100.0)
Monocytes Absolute: 0.6 10*3/uL (ref 0.1–1.0)
Monocytes Relative: 14 %
Neutro Abs: 1.9 10*3/uL (ref 1.7–7.7)
Neutrophils Relative %: 48 %
Platelets: 222 10*3/uL (ref 150–400)
RBC: 3.61 MIL/uL — ABNORMAL LOW (ref 3.87–5.11)
RDW: 12.1 % (ref 11.5–15.5)
WBC: 3.9 10*3/uL — ABNORMAL LOW (ref 4.0–10.5)
nRBC: 0 % (ref 0.0–0.2)

## 2020-08-10 LAB — COMPREHENSIVE METABOLIC PANEL
ALT: 41 U/L (ref 0–44)
AST: 55 U/L — ABNORMAL HIGH (ref 15–41)
Albumin: 3.9 g/dL (ref 3.5–5.0)
Alkaline Phosphatase: 215 U/L — ABNORMAL HIGH (ref 38–126)
Anion gap: 8 (ref 5–15)
BUN: 17 mg/dL (ref 8–23)
CO2: 25 mmol/L (ref 22–32)
Calcium: 9.6 mg/dL (ref 8.9–10.3)
Chloride: 105 mmol/L (ref 98–111)
Creatinine, Ser: 0.69 mg/dL (ref 0.44–1.00)
GFR, Estimated: >60 mL/min (ref >60)
Glucose, Bld: 92 mg/dL (ref 70–99)
Potassium: 4 mmol/L (ref 3.5–5.1)
Sodium: 138 mmol/L (ref 135–145)
Total Bilirubin: 0.7 mg/dL (ref 0.3–1.2)
Total Protein: 6.8 g/dL (ref 6.5–8.1)

## 2020-08-10 MED ORDER — SODIUM CHLORIDE 0.9% FLUSH: 3.0000 mL | Freq: Once | INTRAVENOUS | Status: DC | PRN

## 2020-08-10 MED ORDER — SODIUM CHLORIDE 0.9 % IV SOLN: Freq: Once | INTRAVENOUS | Status: AC

## 2020-08-10 MED ORDER — ZOLEDRONIC ACID 4 MG/100ML IV SOLN
4.0000 mg | Freq: Once | INTRAVENOUS | Status: AC
  Administered 2020-08-10: 4 mg via INTRAVENOUS

## 2020-08-10 MED ORDER — ZOLEDRONIC ACID 4 MG/100ML IV SOLN
INTRAVENOUS | Status: AC
  Filled 2020-08-10: qty 100

## 2020-08-10 NOTE — Patient Instructions (Signed)
[facility] Discharge Instructions for Patients Receiving Zometa  Today you received the following chemotherapy agents   To help prevent nausea and vomiting after your treatment, we encourage you to take your nausea medication    If you develop nausea and vomiting that is not controlled by your nausea medication, call the clinic.   BELOW ARE SYMPTOMS THAT SHOULD BE REPORTED IMMEDIATELY:  *FEVER GREATER THAN 100.5 F  *CHILLS WITH OR WITHOUT FEVER  NAUSEA AND VOMITING THAT IS NOT CONTROLLED WITH YOUR NAUSEA MEDICATION  *UNUSUAL SHORTNESS OF BREATH  *UNUSUAL BRUISING OR BLEEDING  TENDERNESS IN MOUTH AND THROAT WITH OR WITHOUT PRESENCE OF ULCERS  *URINARY PROBLEMS  *BOWEL PROBLEMS  UNUSUAL RASH Items with * indicate a potential emergency and should be followed up as soon as possible.  Feel free to call the clinic should you have any questions or concerns. The clinic phone number is [phone].  Please show the [facility] at check-in to the Emergency Department and triage nurse.

## 2020-08-10 NOTE — Progress Notes (Signed)
Patient presents today for Zometa infusion.  Vital signs are stable.  Labs reviewed, AST 55.  No new complaints since last visit.     Zometa given today per MD orders.  Tolerated infusion without adverse affects.  Vital signs stable.  No complaints at this time.  Discharge from clinic ambulatory in stable condition.  Alert and oriented X 3.  Follow up with [facility] as scheduled.

## 2020-08-24 ENCOUNTER — Encounter (HOSPITAL_COMMUNITY): Payer: Self-pay

## 2020-09-01 ENCOUNTER — Other Ambulatory Visit (HOSPITAL_COMMUNITY): Payer: Self-pay

## 2020-09-01 MED ORDER — LENALIDOMIDE 10 MG PO CAPS
10.0000 mg | ORAL_CAPSULE | Freq: Every day | ORAL | 0 refills | Status: DC
Start: 2020-09-01 — End: 2020-09-01

## 2020-09-01 MED ORDER — LENALIDOMIDE 10 MG PO CAPS
10.0000 mg | ORAL_CAPSULE | Freq: Every day | ORAL | 0 refills | Status: DC
Start: 2020-09-01 — End: 2020-09-05

## 2020-09-01 NOTE — Telephone Encounter (Signed)
Chart reviewed. Revlimid refilled per Dr. Delton Coombes.

## 2020-09-05 ENCOUNTER — Other Ambulatory Visit (HOSPITAL_COMMUNITY): Payer: Self-pay

## 2020-09-05 MED ORDER — LENALIDOMIDE 10 MG PO CAPS
10.0000 mg | ORAL_CAPSULE | Freq: Every day | ORAL | 0 refills | Status: DC
Start: 2020-09-05 — End: 2020-09-27

## 2020-09-07 ENCOUNTER — Other Ambulatory Visit (HOSPITAL_COMMUNITY): Payer: Self-pay | Admitting: *Deleted

## 2020-09-07 ENCOUNTER — Encounter (HOSPITAL_COMMUNITY): Payer: Self-pay

## 2020-09-07 MED ORDER — POTASSIUM CHLORIDE ER 10 MEQ PO TBCR
10.0000 meq | EXTENDED_RELEASE_TABLET | Freq: Two times a day (BID) | ORAL | 3 refills | Status: DC
Start: 2020-09-07 — End: 2020-11-25

## 2020-09-14 ENCOUNTER — Inpatient Hospital Stay (HOSPITAL_COMMUNITY): Payer: Medicare HMO

## 2020-09-14 ENCOUNTER — Inpatient Hospital Stay (HOSPITAL_COMMUNITY): Payer: Medicare HMO | Attending: Hematology | Admitting: Hematology

## 2020-09-14 ENCOUNTER — Other Ambulatory Visit: Payer: Self-pay

## 2020-09-14 VITALS — BP 151/81 | HR 75 | Temp 97.1°F | Resp 18 | Wt 152.3 lb

## 2020-09-14 DIAGNOSIS — I10 Essential (primary) hypertension: Secondary | ICD-10-CM | POA: Diagnosis not present

## 2020-09-14 DIAGNOSIS — C9000 Multiple myeloma not having achieved remission: Secondary | ICD-10-CM

## 2020-09-14 DIAGNOSIS — Z833 Family history of diabetes mellitus: Secondary | ICD-10-CM | POA: Diagnosis not present

## 2020-09-14 DIAGNOSIS — C9001 Multiple myeloma in remission: Secondary | ICD-10-CM

## 2020-09-14 DIAGNOSIS — G629 Polyneuropathy, unspecified: Secondary | ICD-10-CM | POA: Diagnosis not present

## 2020-09-14 DIAGNOSIS — M546 Pain in thoracic spine: Secondary | ICD-10-CM | POA: Diagnosis not present

## 2020-09-14 DIAGNOSIS — Z8379 Family history of other diseases of the digestive system: Secondary | ICD-10-CM | POA: Insufficient documentation

## 2020-09-14 DIAGNOSIS — Z809 Family history of malignant neoplasm, unspecified: Secondary | ICD-10-CM | POA: Diagnosis not present

## 2020-09-14 DIAGNOSIS — Z79899 Other long term (current) drug therapy: Secondary | ICD-10-CM | POA: Insufficient documentation

## 2020-09-14 DIAGNOSIS — Z800 Family history of malignant neoplasm of digestive organs: Secondary | ICD-10-CM | POA: Diagnosis not present

## 2020-09-14 DIAGNOSIS — M25473 Effusion, unspecified ankle: Secondary | ICD-10-CM | POA: Diagnosis not present

## 2020-09-14 DIAGNOSIS — Z8249 Family history of ischemic heart disease and other diseases of the circulatory system: Secondary | ICD-10-CM | POA: Insufficient documentation

## 2020-09-14 DIAGNOSIS — M7989 Other specified soft tissue disorders: Secondary | ICD-10-CM | POA: Diagnosis not present

## 2020-09-14 DIAGNOSIS — Z818 Family history of other mental and behavioral disorders: Secondary | ICD-10-CM | POA: Diagnosis not present

## 2020-09-14 DIAGNOSIS — R5383 Other fatigue: Secondary | ICD-10-CM | POA: Diagnosis not present

## 2020-09-14 DIAGNOSIS — K5900 Constipation, unspecified: Secondary | ICD-10-CM | POA: Insufficient documentation

## 2020-09-14 LAB — COMPREHENSIVE METABOLIC PANEL
ALT: 36 U/L (ref 0–44)
AST: 36 U/L (ref 15–41)
Albumin: 3.6 g/dL (ref 3.5–5.0)
Alkaline Phosphatase: 114 U/L (ref 38–126)
Anion gap: 8 (ref 5–15)
BUN: 14 mg/dL (ref 8–23)
CO2: 25 mmol/L (ref 22–32)
Calcium: 8.7 mg/dL — ABNORMAL LOW (ref 8.9–10.3)
Chloride: 106 mmol/L (ref 98–111)
Creatinine, Ser: 0.75 mg/dL (ref 0.44–1.00)
GFR, Estimated: >60 mL/min (ref >60)
Glucose, Bld: 94 mg/dL (ref 70–99)
Potassium: 3.5 mmol/L (ref 3.5–5.1)
Sodium: 139 mmol/L (ref 135–145)
Total Bilirubin: 0.8 mg/dL (ref 0.3–1.2)
Total Protein: 6.6 g/dL (ref 6.5–8.1)

## 2020-09-14 LAB — CBC WITH DIFFERENTIAL/PLATELET
Abs Immature Granulocytes: 0.01 10*3/uL (ref 0.00–0.07)
Basophils Absolute: 0.1 10*3/uL (ref 0.0–0.1)
Basophils Relative: 1 %
Eosinophils Absolute: 1.3 10*3/uL — ABNORMAL HIGH (ref 0.0–0.5)
Eosinophils Relative: 29 %
HCT: 36.1 % (ref 36.0–46.0)
Hemoglobin: 11.5 g/dL — ABNORMAL LOW (ref 12.0–15.0)
Immature Granulocytes: 0 %
Lymphocytes Relative: 26 %
Lymphs Abs: 1.2 10*3/uL (ref 0.7–4.0)
MCH: 29.8 pg (ref 26.0–34.0)
MCHC: 31.9 g/dL (ref 30.0–36.0)
MCV: 93.5 fL (ref 80.0–100.0)
Monocytes Absolute: 0.3 10*3/uL (ref 0.1–1.0)
Monocytes Relative: 7 %
Neutro Abs: 1.8 10*3/uL (ref 1.7–7.7)
Neutrophils Relative %: 37 %
Platelets: 199 10*3/uL (ref 150–400)
RBC: 3.86 MIL/uL — ABNORMAL LOW (ref 3.87–5.11)
RDW: 12.4 % (ref 11.5–15.5)
WBC: 4.7 10*3/uL (ref 4.0–10.5)
nRBC: 0 % (ref 0.0–0.2)

## 2020-09-14 LAB — MAGNESIUM: Magnesium: 1.6 mg/dL — ABNORMAL LOW (ref 1.7–2.4)

## 2020-09-14 MED ORDER — MAGNESIUM OXIDE 400 (241.3 MG) MG PO TABS: 400.0000 mg | ORAL_TABLET | Freq: Every day | ORAL | 3 refills | Status: DC

## 2020-09-14 MED ORDER — ZOLEDRONIC ACID 4 MG/100ML IV SOLN
4.0000 mg | Freq: Once | INTRAVENOUS | Status: AC
  Administered 2020-09-14: 4 mg via INTRAVENOUS

## 2020-09-14 MED ORDER — ZOLEDRONIC ACID 4 MG/100ML IV SOLN
INTRAVENOUS | Status: AC
  Filled 2020-09-14: qty 100

## 2020-09-14 MED ORDER — SODIUM CHLORIDE 0.9 % IV SOLN: Freq: Once | INTRAVENOUS | Status: AC

## 2020-09-14 NOTE — Patient Instructions (Signed)
[facility] at [facility] Discharge Instructions  You were seen today by Dr. Ellin Saba. He went over your recent results. You received your Zometa injection today; continue getting it every month. You will be prescribed magnesium to take twice daily. Dr. Ellin Saba will see you back in 8 weeks for labs and follow up.   Thank you for choosing [facility] at [facility] to provide your oncology and hematology care.  To afford each patient quality time with our provider, please arrive at least 15 minutes before your scheduled appointment time.   If you have a lab appointment with the Cancer Center please come in thru the Main Entrance and check in at the main information desk  You need to re-schedule your appointment should you arrive 10 or more minutes late.  We strive to give you quality time with our providers, and arriving late affects you and other patients whose appointments are after yours.  Also, if you no show three or more times for appointments you may be dismissed from the clinic at the providers discretion.     Again, thank you for choosing [facility].  Our hope is that these requests will decrease the amount of time that you wait before being seen by our physicians.       _____________________________________________________________  Should you have questions after your visit to [facility], please contact our office at [phone] between the hours of 8:00 a.m. and 4:30 p.m.  Voicemails left after 4:00 p.m. will not be returned until the following business day.  For prescription refill requests, have your pharmacy contact our office and allow 72 hours.    Cancer Center Support Programs:   > Cancer Support Group  2nd Tuesday of the month 1pm-2pm, Journey Room

## 2020-09-14 NOTE — Progress Notes (Signed)
Patient assessed and labs reviewed by Dr. Ellin Saba. Okay for Zometa injection today. Primary RN and Pharmacy aware.

## 2020-09-14 NOTE — Progress Notes (Signed)
Tolerated infusion w/o adverse reaction.  Alert, in no distress.  VSS.  Discharged ambulatory in stable condition.  

## 2020-09-14 NOTE — Progress Notes (Signed)
[facility] [address], [place] [address]   CLINIC:  Medical Oncology/Hematology  PCP:  Gareth Morgan, MD [address] [place] / [place] [address]  [phone]  REASON FOR VISIT:  Follow-up for multiple myeloma  PRIOR THERAPY:  1. RVD x 6 cycles from 08/19/2019 to 12/15/2019. 2. 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  CURRENT THERAPY: Revlimid 10 mg QD; Zometa every 12 weeks  INTERVAL HISTORY:  Stephanie Galvan, a 65 y.o. female, returns for routine follow-up for her multiple myeloma. Stephanie Galvan was last seen on 07/20/2020.  Today she reports feeling okay. She continues having ankles swelling and her breathing is okay; she has Lasix and takes it only as needed. She denies having any new pains or N/V/D. Her energy levels are improving daily. She started taking Revlimid daily and had an episode of constipation the week of December 26th, 2021; she started her second bottle on 12/30. Her back pain has resolved and she reports having mild discomfort in her lower back. She is not currently taking magnesium.  She received her shingles shot on 08/17/2020. She received her COVID booster on 08/25/2020.   REVIEW OF SYSTEMS:  Review of Systems  Constitutional: Positive for appetite change (50%) and fatigue (75%).  Respiratory: Negative for shortness of breath.   Cardiovascular: Positive for leg swelling (ankles swelling).  Gastrointestinal: Positive for constipation. Negative for diarrhea, nausea and vomiting.  Musculoskeletal: Negative for arthralgias.  All other systems reviewed and are negative.   PAST MEDICAL/SURGICAL HISTORY:  Past Medical History:  Diagnosis Date  . Breast cancer (HCC) 2006   left breast  . Breast mass, right   . Compression fracture of cervical spine (HCC) 06/18/2019   recent Chest xray stating had fx.  . High cholesterol   . History of bladder infections   . Hypertension   . Rash Aug 2013   Past Surgical History:  Procedure Laterality  Date  . ABDOMINAL HYSTERECTOMY     just uterus removed  . BREAST BIOPSY    . BREAST LUMPECTOMY WITH RADIOACTIVE SEED LOCALIZATION Right 07/24/2018   Procedure: RIGHT BREAST LUMPECTOMY WITH RADIOACTIVE SEED LOCALIZATION;  Surgeon: Abigail Miyamoto, MD;  Location: [facility];  Service: General;  Laterality: Right;  . IR RADIOLOGIST EVAL & MGMT  07/28/2020  . MASTECTOMY  5/06   left  . PARTIAL HYSTERECTOMY    . PORT-A-CATH REMOVAL    . PORTACATH PLACEMENT      SOCIAL HISTORY:  Social History   Socioeconomic History  . Marital status: Married    Spouse name: Not on file  . Number of children: 2  . Years of education: Not on file  . Highest education level: Not on file  Occupational History    Employer: [employer]  Tobacco Use  . Smoking status: Never Smoker  . Smokeless tobacco: Never Used  Vaping Use  . Vaping Use: Never used  Substance and Sexual Activity  . Alcohol use: No  . Drug use: No  . Sexual activity: Yes    Birth control/protection: Surgical  Other Topics Concern  . Not on file  Social History Narrative  . Not on file   Social Determinants of Health   Financial Resource Strain: Low Risk   . Difficulty of Paying Living Expenses: Not hard at all  Food Insecurity: No Food Insecurity  . Worried About Programme researcher, broadcasting/film/video in the Last Year: Never true  . Ran Out of Food in the Last Year: Never  true  Transportation Needs: No Transportation Needs  . Lack of Transportation (Medical): No  . Lack of Transportation (Non-Medical): No  Physical Activity: Inactive  . Days of Exercise per Week: 0 days  . Minutes of Exercise per Session: 0 min  Stress: No Stress Concern Present  . Feeling of Stress : Not at all  Social Connections: Moderately Integrated  . Frequency of Communication with Friends and Family: More than three times a week  . Frequency of Social Gatherings with Friends and Family: Never  . Attends Religious Services: More than 4 times per  year  . Active Member of Clubs or Organizations: No  . Attends Banker Meetings: Never  . Marital Status: Married  Catering manager Violence: Not At Risk  . Fear of Current or Ex-Partner: No  . Emotionally Abused: No  . Physically Abused: No  . Sexually Abused: No    FAMILY HISTORY:  Family History  Problem Relation Age of Onset  . Dementia Mother   . Stomach cancer Father   . Cancer Brother   . Cancer Brother   . Diabetes Brother   . Colitis Daughter   . Hypertension Daughter   . Hypertension Daughter     CURRENT MEDICATIONS:  Current Outpatient Medications  Medication Sig Dispense Refill  . acyclovir (ZOVIRAX) 400 MG tablet Take 1 tablet (400 mg total) by mouth 2 (two) times daily. 180 tablet 1  . calcium carbonate (TUMS - DOSED IN MG ELEMENTAL CALCIUM) 500 MG chewable tablet Chew 2 tablets by mouth 2 (two) times daily.    . ergocalciferol (VITAMIN D2) 1.25 MG (50000 UT) capsule Take 1 capsule (50,000 Units total) by mouth once a week. 12 capsule 1  . fluticasone (FLOVENT HFA) 220 MCG/ACT inhaler Inhale 1 puff into the lungs daily as needed. 1 each 3  . furosemide (LASIX) 20 MG tablet Take 1 tablet (20 mg total) by mouth daily as needed for fluid. 30 tablet 6  . HYDROcodone-acetaminophen (NORCO) 10-325 MG tablet Take 1 tablet by mouth 2 (two) times daily as needed for moderate pain. 45 tablet 0  . lenalidomide (REVLIMID) 10 MG capsule Take 1 capsule (10 mg total) by mouth daily. [employer] Auth # [id number] 28 capsule 0  . magnesium oxide (MAG-OX) 400 (241.3 Mg) MG tablet Take 1 tablet (400 mg total) by mouth daily. 60 tablet 3  . potassium chloride (KLOR-CON) 10 MEQ tablet Take 1 tablet (10 mEq total) by mouth 2 (two) times daily. 180 tablet 3  . simethicone (MYLICON) 80 MG chewable tablet Chew by mouth.    Marland Kitchen omeprazole (PRILOSEC) 40 MG capsule Take by mouth.    . ondansetron (ZOFRAN) 8 MG tablet Take by mouth.  (Patient not taking: Reported on 09/14/2020)    .  prochlorperazine (COMPAZINE) 10 MG tablet Take 1 tablet (10 mg total) by mouth every 6 (six) hours as needed for nausea or vomiting. (Patient not taking: Reported on 09/14/2020) 45 tablet 2   No current facility-administered medications for this visit.    ALLERGIES:  No Known Allergies  PHYSICAL EXAM:  Performance status (ECOG): 1 - Symptomatic but completely ambulatory  Vitals:   09/14/20 1052  BP: (!) 151/81  Pulse: 75  Resp: 18  Temp: (!) 97.1 F (36.2 C)  SpO2: 99%   Wt Readings from Last 3 Encounters:  09/14/20 152 lb 4.8 oz (69.1 kg)  08/10/20 154 lb 12.8 oz (70.2 kg)  07/20/20 153 lb 10.6 oz (69.7 kg)   Physical Exam  Vitals reviewed.  Constitutional:      Appearance: Normal appearance.  Abdominal:     Palpations: Abdomen is soft. There is no mass.     Tenderness: There is no abdominal tenderness.  Musculoskeletal:     Right lower leg: Edema (trace) present.     Left lower leg: Edema (trace) present.  Neurological:     General: No focal deficit present.     Mental Status: She is alert and oriented to person, place, and time.  Psychiatric:        Mood and Affect: Mood normal.        Behavior: Behavior normal.     LABORATORY DATA:  I have reviewed the labs as listed.  CBC Latest Ref Rng & Units 09/14/2020 08/10/2020 07/20/2020  WBC 4.0 - 10.5 K/uL 4.7 3.9(L) 3.8(L)  Hemoglobin 12.0 - 15.0 g/dL 11.5(L) 11.3(L) 10.9(L)  Hematocrit 36.0 - 46.0 % 36.1 35.1(L) 34.0(L)  Platelets 150 - 400 K/uL 199 222 169   CMP Latest Ref Rng & Units 09/14/2020 08/10/2020 07/20/2020  Glucose 70 - 99 mg/dL 94 92 98  BUN 8 - 23 mg/dL 14 17 13   Creatinine 0.44 - 1.00 mg/dL 4.16 6.06 3.01  Sodium 135 - 145 mmol/L 139 138 139  Potassium 3.5 - 5.1 mmol/L 3.5 4.0 3.6  Chloride 98 - 111 mmol/L 106 105 105  CO2 22 - 32 mmol/L 25 25 25   Calcium 8.9 - 10.3 mg/dL 6.0(F) 9.6 9.0  Total Protein 6.5 - 8.1 g/dL 6.6 6.8 6.5  Total Bilirubin 0.3 - 1.2 mg/dL 0.8 0.7 0.8  Alkaline Phos 38 - 126 U/L  114 215(H) 164(H)  AST 15 - 41 U/L 36 55(H) 45(H)  ALT 0 - 44 U/L 36 41 33      Component Value Date/Time   RBC 3.86 (L) 09/14/2020 0959   MCV 93.5 09/14/2020 0959   MCH 29.8 09/14/2020 0959   MCHC 31.9 09/14/2020 0959   RDW 12.4 09/14/2020 0959   LYMPHSABS 1.2 09/14/2020 0959   MONOABS 0.3 09/14/2020 0959   EOSABS 1.3 (H) 09/14/2020 0959   BASOSABS 0.1 09/14/2020 0959    DIAGNOSTIC IMAGING:  I have independently reviewed the scans and discussed with the patient. No results found.   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 Revlimid started on 10/06/2019 due to delay. -Velcade held since 12/15/2019 due to gastric symptoms, which have resolved completely. -She was evaluated by Dr. Frutoso Chase. Recommended to have immediate bone marrow transplant. -She has compression fractures of T8, T10 and T12. Also L1 and L3 compression fractures. -Revlimid and dexamethasone continued until 03/22/2020. 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 -Revlimid 10 mg daily started around first week of December 2021.  2.  Upper back pain: -MRI on 07/13/2020 shows T7, T8, T12, L1 and L2 compression/superior endplate fractures.  Height loss is greatest and moderate at T7 and T8.  No evidence of bone lesions.   PLAN:  1. IgA lambda plasma cell myeloma: -She started taking Revlimid maintenance 10 mg daily in the first week of December. -We will be careful about secondary malignancies given her previous history of breast cancer. -She is tolerating it very well without any GI side effects.  No severe tiredness. -Reviewed labs from 09/14/2020 which showed normal LFTs, renal function and electrolytes.  CBC was normal. -We will do her vaccinations at our center. -RTC 8 weeks  for follow-up.  Plan to repeat myeloma labs 1 week prior.  2.Upper back pain: -She complained of upper  back pain. -She was evaluated by IR for vertebroplasty.  It was not felt to be helpful for her pain.  3. Bone protection: -Zometa was started back on 08/10/2020.  She will continue monthly.  4.  ID prophylaxis: -Continue acyclovir twice daily.  Continue aspirin 81 mg.  5. Neuropathy: -Continue gabapentin 300 mg at bedtime.  6.  Hypomagnesemia: -Magnesium is 1.6.  Will start on magnesium twice daily.  Orders placed this encounter:  Orders Placed This Encounter  Procedures  . CBC with Differential/Platelet  . Comprehensive metabolic panel  . Magnesium  . Immunofixation electrophoresis  . Protein electrophoresis, serum  . Kappa/lambda light chains     [name], MD [facility] [phone]   I, Drue Second, am acting as a scribe for Dr. Payton Mccallum.  I, [name] MD, have reviewed the above documentation for accuracy and completeness, and I agree with the above.

## 2020-09-26 ENCOUNTER — Encounter (HOSPITAL_COMMUNITY): Payer: Self-pay

## 2020-09-27 ENCOUNTER — Other Ambulatory Visit (HOSPITAL_COMMUNITY): Payer: Self-pay

## 2020-09-27 MED ORDER — LENALIDOMIDE 10 MG PO CAPS: 10.0000 mg | ORAL_CAPSULE | Freq: Every day | ORAL | 0 refills | Status: DC

## 2020-09-27 NOTE — Telephone Encounter (Signed)
Chart reviewed. Revlimid refilled per Dr. Delton Coombes

## 2020-09-28 ENCOUNTER — Encounter (HOSPITAL_COMMUNITY): Payer: Self-pay

## 2020-09-29 ENCOUNTER — Telehealth: Payer: Self-pay | Admitting: Pharmacy Technician

## 2020-09-29 ENCOUNTER — Encounter (HOSPITAL_COMMUNITY): Payer: Self-pay

## 2020-09-29 NOTE — Telephone Encounter (Signed)
Oral Oncology Patient Advocate Encounter  Patient had [employer] fax over re-enrollment application for Revlimid.  Patient switched to Medicare in October 2021 and can no longer use [employer] copay card.  I called [facility] to see what they were using for the patients copay.  Pharmacist said that the patient has a grant through [employer] and has plenty of funding left.  If patient runs out of funds, they will reach out to the patient and apply for another grant.  I called Mrs Plourde and explained to her that she did not need to apply with [employer] since she had grant assistance. She understands and was grateful for the call.  [facility] Phone [phone] Fax [phone] 09/29/2020 3:33 PM

## 2020-10-03 ENCOUNTER — Encounter (HOSPITAL_COMMUNITY): Payer: Self-pay

## 2020-10-06 ENCOUNTER — Encounter (HOSPITAL_COMMUNITY): Payer: Self-pay

## 2020-10-12 ENCOUNTER — Encounter (HOSPITAL_COMMUNITY): Payer: Self-pay

## 2020-10-12 ENCOUNTER — Other Ambulatory Visit: Payer: Self-pay

## 2020-10-12 ENCOUNTER — Inpatient Hospital Stay (HOSPITAL_COMMUNITY): Payer: Medicare HMO | Attending: Hematology

## 2020-10-12 ENCOUNTER — Other Ambulatory Visit (HOSPITAL_COMMUNITY): Payer: Medicare HMO

## 2020-10-12 ENCOUNTER — Inpatient Hospital Stay (HOSPITAL_COMMUNITY): Payer: Medicare HMO

## 2020-10-12 VITALS — BP 132/65 | HR 68 | Temp 97.1°F | Resp 18

## 2020-10-12 DIAGNOSIS — Z833 Family history of diabetes mellitus: Secondary | ICD-10-CM | POA: Insufficient documentation

## 2020-10-12 DIAGNOSIS — Z818 Family history of other mental and behavioral disorders: Secondary | ICD-10-CM | POA: Diagnosis not present

## 2020-10-12 DIAGNOSIS — Z800 Family history of malignant neoplasm of digestive organs: Secondary | ICD-10-CM | POA: Insufficient documentation

## 2020-10-12 DIAGNOSIS — M7989 Other specified soft tissue disorders: Secondary | ICD-10-CM | POA: Insufficient documentation

## 2020-10-12 DIAGNOSIS — K5900 Constipation, unspecified: Secondary | ICD-10-CM | POA: Diagnosis not present

## 2020-10-12 DIAGNOSIS — R609 Edema, unspecified: Secondary | ICD-10-CM | POA: Insufficient documentation

## 2020-10-12 DIAGNOSIS — Z8379 Family history of other diseases of the digestive system: Secondary | ICD-10-CM | POA: Diagnosis not present

## 2020-10-12 DIAGNOSIS — Z809 Family history of malignant neoplasm, unspecified: Secondary | ICD-10-CM | POA: Insufficient documentation

## 2020-10-12 DIAGNOSIS — G629 Polyneuropathy, unspecified: Secondary | ICD-10-CM | POA: Insufficient documentation

## 2020-10-12 DIAGNOSIS — M5489 Other dorsalgia: Secondary | ICD-10-CM | POA: Insufficient documentation

## 2020-10-12 DIAGNOSIS — R5383 Other fatigue: Secondary | ICD-10-CM | POA: Diagnosis not present

## 2020-10-12 DIAGNOSIS — Z79899 Other long term (current) drug therapy: Secondary | ICD-10-CM | POA: Diagnosis not present

## 2020-10-12 DIAGNOSIS — C9000 Multiple myeloma not having achieved remission: Secondary | ICD-10-CM

## 2020-10-12 DIAGNOSIS — Z8249 Family history of ischemic heart disease and other diseases of the circulatory system: Secondary | ICD-10-CM | POA: Diagnosis not present

## 2020-10-12 LAB — COMPREHENSIVE METABOLIC PANEL
ALT: 37 U/L (ref 0–44)
AST: 29 U/L (ref 15–41)
Albumin: 3.6 g/dL (ref 3.5–5.0)
Alkaline Phosphatase: 135 U/L — ABNORMAL HIGH (ref 38–126)
Anion gap: 6 (ref 5–15)
BUN: 15 mg/dL (ref 8–23)
CO2: 25 mmol/L (ref 22–32)
Calcium: 8.9 mg/dL (ref 8.9–10.3)
Chloride: 107 mmol/L (ref 98–111)
Creatinine, Ser: 0.71 mg/dL (ref 0.44–1.00)
GFR, Estimated: >60 mL/min (ref >60)
Glucose, Bld: 83 mg/dL (ref 70–99)
Potassium: 3.9 mmol/L (ref 3.5–5.1)
Sodium: 138 mmol/L (ref 135–145)
Total Bilirubin: 0.7 mg/dL (ref 0.3–1.2)
Total Protein: 6.4 g/dL — ABNORMAL LOW (ref 6.5–8.1)

## 2020-10-12 LAB — CBC WITH DIFFERENTIAL/PLATELET
Abs Immature Granulocytes: 0.01 10*3/uL (ref 0.00–0.07)
Basophils Absolute: 0 10*3/uL (ref 0.0–0.1)
Basophils Relative: 1 %
Eosinophils Absolute: 0.4 10*3/uL (ref 0.0–0.5)
Eosinophils Relative: 12 %
HCT: 36.6 % (ref 36.0–46.0)
Hemoglobin: 11.5 g/dL — ABNORMAL LOW (ref 12.0–15.0)
Immature Granulocytes: 0 %
Lymphocytes Relative: 36 %
Lymphs Abs: 1.2 10*3/uL (ref 0.7–4.0)
MCH: 29.6 pg (ref 26.0–34.0)
MCHC: 31.4 g/dL (ref 30.0–36.0)
MCV: 94.1 fL (ref 80.0–100.0)
Monocytes Absolute: 0.3 10*3/uL (ref 0.1–1.0)
Monocytes Relative: 10 %
Neutro Abs: 1.3 10*3/uL — ABNORMAL LOW (ref 1.7–7.7)
Neutrophils Relative %: 41 %
Platelets: 138 10*3/uL — ABNORMAL LOW (ref 150–400)
RBC: 3.89 MIL/uL (ref 3.87–5.11)
RDW: 13.6 % (ref 11.5–15.5)
WBC: 3.2 10*3/uL — ABNORMAL LOW (ref 4.0–10.5)
nRBC: 0 % (ref 0.0–0.2)

## 2020-10-12 LAB — MAGNESIUM: Magnesium: 1.9 mg/dL (ref 1.7–2.4)

## 2020-10-12 MED ORDER — ZOLEDRONIC ACID 4 MG/100ML IV SOLN
4.0000 mg | Freq: Once | INTRAVENOUS | Status: AC
  Administered 2020-10-12: 4 mg via INTRAVENOUS
  Filled 2020-10-12: qty 100

## 2020-10-12 MED ORDER — SODIUM CHLORIDE 0.9 % IV SOLN: Freq: Once | INTRAVENOUS | Status: AC

## 2020-10-12 NOTE — Progress Notes (Signed)
Patient tolerated therapy with no complaints voiced.  Side effects with management reviewed with understanding verbalized.  Peripheral IV site clean and dry with no bruising or swelling noted at site.  Good blood return noted before and after administration of therapy.  Band aid applied.  Patient left in satisfactory condition with VSS and no s/s of distress noted.  

## 2020-10-13 ENCOUNTER — Ambulatory Visit (HOSPITAL_COMMUNITY): Payer: Medicare HMO

## 2020-10-13 ENCOUNTER — Ambulatory Visit (HOSPITAL_COMMUNITY): Payer: Medicare HMO | Admitting: Hematology

## 2020-10-13 ENCOUNTER — Other Ambulatory Visit (HOSPITAL_COMMUNITY): Payer: Medicare HMO

## 2020-10-24 ENCOUNTER — Other Ambulatory Visit (HOSPITAL_COMMUNITY): Payer: Self-pay

## 2020-10-24 MED ORDER — LENALIDOMIDE 10 MG PO CAPS: 10.0000 mg | ORAL_CAPSULE | Freq: Every day | ORAL | 0 refills | Status: DC

## 2020-10-24 NOTE — Telephone Encounter (Signed)
Chart reviewed. Revlimid refilled per Dr. Delton Coombes

## 2020-10-27 ENCOUNTER — Other Ambulatory Visit (HOSPITAL_COMMUNITY): Payer: Self-pay | Admitting: Hematology

## 2020-10-27 DIAGNOSIS — D472 Monoclonal gammopathy: Secondary | ICD-10-CM

## 2020-11-06 ENCOUNTER — Other Ambulatory Visit (HOSPITAL_COMMUNITY): Payer: Self-pay | Admitting: Hematology

## 2020-11-06 DIAGNOSIS — D472 Monoclonal gammopathy: Secondary | ICD-10-CM

## 2020-11-09 ENCOUNTER — Inpatient Hospital Stay (HOSPITAL_COMMUNITY): Payer: Medicare HMO | Admitting: Hematology

## 2020-11-09 ENCOUNTER — Inpatient Hospital Stay (HOSPITAL_COMMUNITY): Payer: Medicare HMO | Attending: Hematology

## 2020-11-09 ENCOUNTER — Inpatient Hospital Stay (HOSPITAL_COMMUNITY): Payer: Medicare HMO

## 2020-11-09 ENCOUNTER — Other Ambulatory Visit: Payer: Self-pay

## 2020-11-09 VITALS — BP 146/58 | HR 70 | Temp 97.3°F | Resp 17

## 2020-11-09 VITALS — BP 157/84 | HR 66 | Temp 97.2°F | Resp 17 | Wt 156.2 lb

## 2020-11-09 DIAGNOSIS — Z800 Family history of malignant neoplasm of digestive organs: Secondary | ICD-10-CM | POA: Insufficient documentation

## 2020-11-09 DIAGNOSIS — Z8379 Family history of other diseases of the digestive system: Secondary | ICD-10-CM | POA: Insufficient documentation

## 2020-11-09 DIAGNOSIS — M546 Pain in thoracic spine: Secondary | ICD-10-CM | POA: Diagnosis not present

## 2020-11-09 DIAGNOSIS — R21 Rash and other nonspecific skin eruption: Secondary | ICD-10-CM | POA: Insufficient documentation

## 2020-11-09 DIAGNOSIS — R200 Anesthesia of skin: Secondary | ICD-10-CM | POA: Insufficient documentation

## 2020-11-09 DIAGNOSIS — G629 Polyneuropathy, unspecified: Secondary | ICD-10-CM | POA: Diagnosis not present

## 2020-11-09 DIAGNOSIS — Z853 Personal history of malignant neoplasm of breast: Secondary | ICD-10-CM | POA: Insufficient documentation

## 2020-11-09 DIAGNOSIS — C9000 Multiple myeloma not having achieved remission: Secondary | ICD-10-CM | POA: Diagnosis present

## 2020-11-09 DIAGNOSIS — Z818 Family history of other mental and behavioral disorders: Secondary | ICD-10-CM | POA: Diagnosis not present

## 2020-11-09 DIAGNOSIS — K5900 Constipation, unspecified: Secondary | ICD-10-CM | POA: Diagnosis not present

## 2020-11-09 DIAGNOSIS — R110 Nausea: Secondary | ICD-10-CM | POA: Insufficient documentation

## 2020-11-09 DIAGNOSIS — N898 Other specified noninflammatory disorders of vagina: Secondary | ICD-10-CM | POA: Insufficient documentation

## 2020-11-09 DIAGNOSIS — C9001 Multiple myeloma in remission: Secondary | ICD-10-CM | POA: Diagnosis not present

## 2020-11-09 DIAGNOSIS — Z79899 Other long term (current) drug therapy: Secondary | ICD-10-CM | POA: Diagnosis not present

## 2020-11-09 DIAGNOSIS — D72819 Decreased white blood cell count, unspecified: Secondary | ICD-10-CM | POA: Diagnosis not present

## 2020-11-09 DIAGNOSIS — Z833 Family history of diabetes mellitus: Secondary | ICD-10-CM | POA: Diagnosis not present

## 2020-11-09 DIAGNOSIS — Z8249 Family history of ischemic heart disease and other diseases of the circulatory system: Secondary | ICD-10-CM | POA: Diagnosis not present

## 2020-11-09 DIAGNOSIS — Z809 Family history of malignant neoplasm, unspecified: Secondary | ICD-10-CM | POA: Diagnosis not present

## 2020-11-09 LAB — CBC WITH DIFFERENTIAL/PLATELET
Abs Immature Granulocytes: 0.01 10*3/uL (ref 0.00–0.07)
Basophils Absolute: 0 10*3/uL (ref 0.0–0.1)
Basophils Relative: 2 %
Eosinophils Absolute: 0.2 10*3/uL (ref 0.0–0.5)
Eosinophils Relative: 10 %
HCT: 35.9 % — ABNORMAL LOW (ref 36.0–46.0)
Hemoglobin: 11.1 g/dL — ABNORMAL LOW (ref 12.0–15.0)
Immature Granulocytes: 0 %
Lymphocytes Relative: 39 %
Lymphs Abs: 0.9 10*3/uL (ref 0.7–4.0)
MCH: 28.8 pg (ref 26.0–34.0)
MCHC: 30.9 g/dL (ref 30.0–36.0)
MCV: 93.2 fL (ref 80.0–100.0)
Monocytes Absolute: 0.2 10*3/uL (ref 0.1–1.0)
Monocytes Relative: 9 %
Neutro Abs: 0.9 10*3/uL — ABNORMAL LOW (ref 1.7–7.7)
Neutrophils Relative %: 40 %
Platelets: 165 10*3/uL (ref 150–400)
RBC: 3.85 MIL/uL — ABNORMAL LOW (ref 3.87–5.11)
RDW: 15.1 % (ref 11.5–15.5)
WBC: 2.3 10*3/uL — ABNORMAL LOW (ref 4.0–10.5)
nRBC: 0 % (ref 0.0–0.2)

## 2020-11-09 LAB — COMPREHENSIVE METABOLIC PANEL
ALT: 48 U/L — ABNORMAL HIGH (ref 0–44)
AST: 34 U/L (ref 15–41)
Albumin: 3.5 g/dL (ref 3.5–5.0)
Alkaline Phosphatase: 141 U/L — ABNORMAL HIGH (ref 38–126)
Anion gap: 9 (ref 5–15)
BUN: 17 mg/dL (ref 8–23)
CO2: 25 mmol/L (ref 22–32)
Calcium: 8.9 mg/dL (ref 8.9–10.3)
Chloride: 105 mmol/L (ref 98–111)
Creatinine, Ser: 0.71 mg/dL (ref 0.44–1.00)
GFR, Estimated: >60 mL/min (ref >60)
Glucose, Bld: 80 mg/dL (ref 70–99)
Potassium: 3.7 mmol/L (ref 3.5–5.1)
Sodium: 139 mmol/L (ref 135–145)
Total Bilirubin: 0.6 mg/dL (ref 0.3–1.2)
Total Protein: 6.5 g/dL (ref 6.5–8.1)

## 2020-11-09 LAB — MAGNESIUM: Magnesium: 1.7 mg/dL (ref 1.7–2.4)

## 2020-11-09 MED ORDER — SODIUM CHLORIDE 0.9 % IV SOLN: Freq: Once | INTRAVENOUS | Status: AC

## 2020-11-09 MED ORDER — ZOLEDRONIC ACID 4 MG/100ML IV SOLN
4.0000 mg | Freq: Once | INTRAVENOUS | Status: AC
  Administered 2020-11-09: 4 mg via INTRAVENOUS

## 2020-11-09 MED ORDER — ZOLEDRONIC ACID 4 MG/100ML IV SOLN
INTRAVENOUS | Status: AC
  Filled 2020-11-09: qty 100

## 2020-11-09 NOTE — Patient Instructions (Signed)
[facility] at [facility]  Discharge Instructions:   _______________________________________________________________  Thank you for choosing [facility] at [facility] to provide your oncology and hematology care.  To afford each patient quality time with our providers, please arrive at least 15 minutes before your scheduled appointment.  You need to re-schedule your appointment if you arrive 10 or more minutes late.  We strive to give you quality time with our providers, and arriving late affects you and other patients whose appointments are after yours.  Also, if you no show three or more times for appointments you may be dismissed from the clinic.  Again, thank you for choosing [facility]. Our hope is that these requests will allow you access to exceptional care and in a timely manner. _______________________________________________________________  If you have questions after your visit, please contact our office at [phone] between the hours of 8:30 a.m. and 5:00 p.m. Voicemails left after 4:30 p.m. will not be returned until the following business day. _______________________________________________________________  For prescription refill requests, have your pharmacy contact our office. _______________________________________________________________  Recommendations made by the consultant and any test results will be sent to your referring physician. _______________________________________________________________ 

## 2020-11-09 NOTE — Progress Notes (Signed)
[address], [place] [address]   CLINIC:  Medical Oncology/Hematology  PCP:  [name], MD [place] / [place] [address]  [phone]  REASON FOR VISIT:  Follow-up for multiple myeloma  PRIOR THERAPY:  1.RVDx 6 cycles from 08/19/2019 to 12/15/2019. 2. 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  CURRENT THERAPY: Revlimid 10 mg daily; Zometa every 12 weeks  INTERVAL HISTORY:  Stephanie Galvan, a 65 y.o. female, returns for routine follow-up for her multiple myeloma. Stephanie Galvan was last seen on 09/14/2020.  Today she reports feeling okay. She is tolerated the Revlimid well, though she reports having a small scaly rash on her left jaw over the past 1 week on which she applied Benadryl cream, as well as dry skin on her arms. She complains of having vaginal discharge for the past 1 week with clear, non-bloody discharge, which she has never had previously. She complains of having nausea whenever she takes magnesium and the nausea resolves when she stops taking them.  She is getting annual mammograms. She is open to getting Evushield to boost her COVID immunity.   REVIEW OF SYSTEMS:  Review of Systems  Constitutional: Positive for appetite change (75%) and fatigue (50%).  Gastrointestinal: Positive for constipation and nausea.  Genitourinary: Positive for vaginal discharge (clear discharge for 1 week).   Skin: Positive for rash (on face).  Neurological: Positive for numbness (fingers when touching cold).  All other systems reviewed and are negative.   PAST MEDICAL/SURGICAL HISTORY:  Past Medical History:  Diagnosis Date  . Breast cancer ([facility]) 2006   left breast  . Breast mass, right   . Compression fracture of cervical spine ([facility]) 06/18/2019   recent Chest xray stating had fx.  . High cholesterol   . History of bladder infections   . Hypertension   . Rash Aug 2013   Past Surgical History:  Procedure Laterality Date  . ABDOMINAL  HYSTERECTOMY     just uterus removed  . BREAST BIOPSY    . BREAST LUMPECTOMY WITH RADIOACTIVE SEED LOCALIZATION Right 07/24/2018   Procedure: RIGHT BREAST LUMPECTOMY WITH RADIOACTIVE SEED LOCALIZATION;  Surgeon: Coralie Keens, MD;  Location: [facility];  Service: General;  Laterality: Right;  . IR RADIOLOGIST EVAL & MGMT  07/28/2020  . MASTECTOMY  5/06   left  . PARTIAL HYSTERECTOMY    . PORT-A-CATH REMOVAL    . PORTACATH PLACEMENT      SOCIAL HISTORY:  Social History   Socioeconomic History  . Marital status: Married    Spouse name: Not on file  . Number of children: 2  . Years of education: Not on file  . Highest education level: Not on file  Occupational History    Employer: [employer]  Tobacco Use  . Smoking status: Never Smoker  . Smokeless tobacco: Never Used  Vaping Use  . Vaping Use: Never used  Substance and Sexual Activity  . Alcohol use: No  . Drug use: No  . Sexual activity: Yes    Birth control/protection: Surgical  Other Topics Concern  . Not on file  Social History Narrative  . Not on file   Social Determinants of Health   Financial Resource Strain: Low Risk   . Difficulty of Paying Living Expenses: Not hard at all  Food Insecurity: No Food Insecurity  . Worried About Charity fundraiser in the Last Year: Never true  . Ran Out of Food in the Last Year:  Never true  Transportation Needs: No Transportation Needs  . Lack of Transportation (Medical): No  . Lack of Transportation (Non-Medical): No  Physical Activity: Inactive  . Days of Exercise per Week: 0 days  . Minutes of Exercise per Session: 0 min  Stress: No Stress Concern Present  . Feeling of Stress : Not at all  Social Connections: Moderately Integrated  . Frequency of Communication with Friends and Family: More than three times a week  . Frequency of Social Gatherings with Friends and Family: Never  . Attends Religious Services: More than 4 times per year  . Active  Member of Clubs or Organizations: No  . Attends Archivist Meetings: Never  . Marital Status: Married  Human resources officer Violence: Not At Risk  . Fear of Current or Ex-Partner: No  . Emotionally Abused: No  . Physically Abused: No  . Sexually Abused: No    FAMILY HISTORY:  Family History  Problem Relation Age of Onset  . Dementia Mother   . Stomach cancer Father   . Cancer Brother   . Cancer Brother   . Diabetes Brother   . Colitis Daughter   . Hypertension Daughter   . Hypertension Daughter     CURRENT MEDICATIONS:  Current Outpatient Medications  Medication Sig Dispense Refill  . acyclovir (ZOVIRAX) 400 MG tablet TAKE 1 TABLET BY MOUTH TWICE A DAY 180 tablet 1  . calcium carbonate (TUMS - DOSED IN MG ELEMENTAL CALCIUM) 500 MG chewable tablet Chew 2 tablets by mouth 2 (two) times daily.    . ergocalciferol (VITAMIN D2) 1.25 MG (50000 UT) capsule Take 1 capsule (50,000 Units total) by mouth once a week. 12 capsule 1  . furosemide (LASIX) 20 MG tablet Take 1 tablet (20 mg total) by mouth daily as needed for fluid. 30 tablet 6  . HYDROcodone-acetaminophen (NORCO) 10-325 MG tablet Take 1 tablet by mouth 2 (two) times daily as needed for moderate pain. 45 tablet 0  . lenalidomide (REVLIMID) 10 MG capsule Take 1 capsule (10 mg total) by mouth daily. [employer] Auth # [id number] 28 capsule 0  . magnesium oxide (MAG-OX) 400 (241.3 Mg) MG tablet Take 1 tablet (400 mg total) by mouth daily. 60 tablet 3  . ondansetron (ZOFRAN) 8 MG tablet Take by mouth.    . potassium chloride (KLOR-CON) 10 MEQ tablet Take 1 tablet (10 mEq total) by mouth 2 (two) times daily. 180 tablet 3  . prochlorperazine (COMPAZINE) 10 MG tablet Take 1 tablet (10 mg total) by mouth every 6 (six) hours as needed for nausea or vomiting. 45 tablet 2  . simethicone (MYLICON) 80 MG chewable tablet Chew by mouth.    . fluticasone (FLOVENT HFA) 220 MCG/ACT inhaler Inhale 1 puff into the lungs daily as needed. 1 each 3  .  omeprazole (PRILOSEC) 40 MG capsule Take by mouth.     No current facility-administered medications for this visit.    ALLERGIES:  No Known Allergies  PHYSICAL EXAM:  Performance status (ECOG): 1 - Symptomatic but completely ambulatory  Vitals:   11/09/20 1254  BP: (!) 157/84  Pulse: 66  Resp: 17  Temp: (!) 97.2 F (36.2 C)  SpO2: 100%   Wt Readings from Last 3 Encounters:  11/09/20 156 lb 3.2 oz (70.9 kg)  09/14/20 152 lb 4.8 oz (69.1 kg)  08/10/20 154 lb 12.8 oz (70.2 kg)   Physical Exam Vitals reviewed.  Constitutional:      Appearance: Normal appearance.  Cardiovascular:  Rate and Rhythm: Normal rate and regular rhythm.     Pulses: Normal pulses.     Heart sounds: Normal heart sounds.  Pulmonary:     Effort: Pulmonary effort is normal.     Breath sounds: Normal breath sounds.  Musculoskeletal:     Right lower leg: No edema.     Left lower leg: No edema.  Skin:    General: Skin is dry.     Findings: Rash present. Rash is scaling (left perioral erythema & scaly rash).  Neurological:     General: No focal deficit present.     Mental Status: She is alert and oriented to person, place, and time.  Psychiatric:        Mood and Affect: Mood normal.        Behavior: Behavior normal.     LABORATORY DATA:  I have reviewed the labs as listed.  CBC Latest Ref Rng & Units 11/09/2020 10/12/2020 09/14/2020  WBC 4.0 - 10.5 K/uL 2.3(L) 3.2(L) 4.7  Hemoglobin 12.0 - 15.0 g/dL 11.1(L) 11.5(L) 11.5(L)  Hematocrit 36.0 - 46.0 % 35.9(L) 36.6 36.1  Platelets 150 - 400 K/uL 165 138(L) 199   CMP Latest Ref Rng & Units 11/09/2020 10/12/2020 09/14/2020  Glucose 70 - 99 mg/dL 80 83 94  BUN 8 - 23 mg/dL 17 15 14   Creatinine 0.44 - 1.00 mg/dL 0.71 0.71 0.75  Sodium 135 - 145 mmol/L 139 138 139  Potassium 3.5 - 5.1 mmol/L 3.7 3.9 3.5  Chloride 98 - 111 mmol/L 105 107 106  CO2 22 - 32 mmol/L 25 25 25   Calcium 8.9 - 10.3 mg/dL 8.9 8.9 8.7(L)  Total Protein 6.5 - 8.1 g/dL 6.5 6.4(L) 6.6   Total Bilirubin 0.3 - 1.2 mg/dL 0.6 0.7 0.8  Alkaline Phos 38 - 126 U/L 141(H) 135(H) 114  AST 15 - 41 U/L 34 29 36  ALT 0 - 44 U/L 48(H) 37 36      Component Value Date/Time   RBC 3.85 (L) 11/09/2020 1221   MCV 93.2 11/09/2020 1221   MCH 28.8 11/09/2020 1221   MCHC 30.9 11/09/2020 1221   RDW 15.1 11/09/2020 1221   LYMPHSABS 0.9 11/09/2020 1221   MONOABS 0.2 11/09/2020 1221   EOSABS 0.2 11/09/2020 1221   BASOSABS 0.0 11/09/2020 1221   Lab Results  Component Value Date   TOTALPROTELP 5.9 (L) 07/05/2020   TOTALPROTELP 6.0 07/05/2020   ALBUMINELP 3.5 07/05/2020   A1GS 0.3 07/05/2020   A2GS 0.6 07/05/2020   BETS 1.0 07/05/2020   GAMS 0.6 07/05/2020   MSPIKE Not Observed 07/05/2020   SPEI Comment 07/05/2020    Lab Results  Component Value Date   KPAFRELGTCHN 6.6 07/05/2020   LAMBDASER 5.5 (L) 07/05/2020   KAPLAMBRATIO 1.20 07/05/2020    DIAGNOSTIC IMAGING:  I have independently reviewed the scans and discussed with the patient. No results found.   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 Revlimid started on 10/06/2019 due to delay. -Velcade held since 12/15/2019 due to gastric symptoms, which have resolved completely. -She was evaluated by Dr. Jannifer Franklin. Recommended to have immediate bone marrow transplant. -She has compression fractures of T8, T10 and T12. Also L1 and L3 compression fractures. -Revlimid and dexamethasone continued until 03/22/2020. 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 -Revlimid 10 mg daily started around first week of December 2021.  2. Upper back pain: -MRI  on 07/13/2020 shows T7, T8, T12, L1 and L2 compression/superior endplate fractures. Height loss is greatest and moderate at T7 and T8. No evidence of bone lesions.   PLAN:  1. IgA lambda plasma cell myeloma: -She will continue Revlimid 10 mg  daily. -Reviewed labs today which showed mild leukopenia with ANC of 900.  Alk phos was elevated at 141 and stable.  ALT was slightly high at 48. -Myeloma labs are pending from today. -She reported erythematous maculopapular area below the left angle of the mouth which was seen by her about a week ago.  It is scaling and red.  Recommend evaluation by dermatology has higher incidence of secondary malignancies with Revlimid. -She also complained of clear vaginal discharge.  She will follow up with her PMD/GYN. -I have also talked to her about Evusheld and its side effects.  We will make a referral to see if she becomes eligible. -RTC 2 months for follow-up.  2.Upper back pain: -She was evaluated by IR for vertebroplasty.  Not recommended.  3. Bone protection: -Zometa was started back on 08/10/2020.  She will continue monthly.  4.ID prophylaxis: -Continue acyclovir twice daily.  Continue aspirin 81 mg daily.  5. Neuropathy: -Continue gabapentin 300 mg at bedtime.  6.  Hypomagnesemia: -She started having nausea after taking magnesium.  She will cut back magnesium to every other day.  If nausea persists, she will completely discontinue it.  Magnesium today is 1.7.  Orders placed this encounter:  Orders Placed This Encounter  Procedures  . CBC with Differential/Platelet  . Comprehensive metabolic panel  . Magnesium     Derek Jack, MD [place] [phone]   I, Milinda Antis, am acting as a scribe for Dr. Sanda Linger.  I, Derek Jack MD, have reviewed the above documentation for accuracy and completeness, and I agree with the above.

## 2020-11-09 NOTE — Progress Notes (Signed)
Zometa 4 mgs given today per MD orders. Tolerated infusion without adverse affects. Vital signs stable. No complaints at this time. Discharged from clinic ambulatory in stable condition. Alert and oriented x 3. F/U with [facility] as scheduled.   

## 2020-11-09 NOTE — Patient Instructions (Addendum)
[place] at [facility] Discharge Instructions  You were seen today by Dr. Delton Coombes. He went over your recent results. You received your Zometa injection today; continue getting your Zometa every month. You will be referred to a dermatologist for the rash on your face to rule out a possible skin cancer. Take magnesium every other day to see if your nausea improves. Dr. Delton Coombes will see you back in 2 months for labs and follow up.   Thank you for choosing [place] at [facility] to provide your oncology and hematology care.  To afford each patient quality time with our provider, please arrive at least 15 minutes before your scheduled appointment time.   If you have a lab appointment with the [facility] please come in thru the Main Entrance and check in at the main information desk  You need to re-schedule your appointment should you arrive 10 or more minutes late.  We strive to give you quality time with our providers, and arriving late affects you and other patients whose appointments are after yours.  Also, if you no show three or more times for appointments you may be dismissed from the clinic at the providers discretion.     Again, thank you for choosing [facility].  Our hope is that these requests will decrease the amount of time that you wait before being seen by our physicians.       _____________________________________________________________  Should you have questions after your visit to [facility], please contact our office at [phone] between the hours of 8:00 a.m. and 4:30 p.m.  Voicemails left after 4:00 p.m. will not be returned until the following business day.  For prescription refill requests, have your pharmacy contact our office and allow 72 hours.    Cancer Center Support Programs:   > Cancer Support Group  2nd Tuesday of the month 1pm-2pm, Journey Room

## 2020-11-09 NOTE — Progress Notes (Signed)
Patient was assessed by Dr. Katragadda and labs have been reviewed.  Patient is okay to proceed with treatment today. Primary RN and pharmacy aware.   

## 2020-11-10 LAB — KAPPA/LAMBDA LIGHT CHAINS
Kappa free light chain: 23 mg/L — ABNORMAL HIGH (ref 3.3–19.4)
Kappa, lambda light chain ratio: 0.86 (ref 0.26–1.65)
Lambda free light chains: 26.8 mg/L — ABNORMAL HIGH (ref 5.7–26.3)

## 2020-11-11 LAB — PROTEIN ELECTROPHORESIS, SERUM
A/G Ratio: 1.4 (ref 0.7–1.7)
Albumin ELP: 3.6 g/dL (ref 2.9–4.4)
Alpha-1-Globulin: 0.2 g/dL (ref 0.0–0.4)
Alpha-2-Globulin: 0.7 g/dL (ref 0.4–1.0)
Beta Globulin: 0.9 g/dL (ref 0.7–1.3)
Gamma Globulin: 0.8 g/dL (ref 0.4–1.8)
Globulin, Total: 2.6 g/dL (ref 2.2–3.9)
Total Protein ELP: 6.2 g/dL (ref 6.0–8.5)

## 2020-11-12 LAB — IMMUNOFIXATION ELECTROPHORESIS
IgA: 44 mg/dL — ABNORMAL LOW (ref 87–352)
IgG (Immunoglobin G), Serum: 932 mg/dL (ref 586–1602)
IgM (Immunoglobulin M), Srm: 38 mg/dL (ref 26–217)
Total Protein ELP: 5.9 g/dL — ABNORMAL LOW (ref 6.0–8.5)

## 2020-11-19 ENCOUNTER — Other Ambulatory Visit (HOSPITAL_COMMUNITY): Payer: Self-pay | Admitting: Hematology

## 2020-11-19 ENCOUNTER — Telehealth: Payer: Self-pay | Admitting: Physician Assistant

## 2020-11-19 NOTE — Telephone Encounter (Signed)
Called pt about Evusheld (tixagevimab co-packaged with cilgavimab) for pre-exposure prophylaxis for prevention of coronavirus disease 2019 (COVID-19) caused by the SARS-CoV-2 virus. The patient is a candidate for this therapy given increased risk for severe disease caused by immunosuppression.    Unable to reach pt- left VM and mychart message.   Kathryn Thompson PA-C  [facility]     

## 2020-11-21 ENCOUNTER — Other Ambulatory Visit: Payer: Self-pay | Admitting: Adult Health

## 2020-11-21 DIAGNOSIS — C9000 Multiple myeloma not having achieved remission: Secondary | ICD-10-CM

## 2020-11-21 MED ORDER — LENALIDOMIDE 10 MG PO CAPS: 10.0000 mg | ORAL_CAPSULE | Freq: Every day | ORAL | 0 refills | Status: DC

## 2020-11-21 NOTE — Telephone Encounter (Signed)
Chart reviewed. Revlimid refilled per Dr. Delton Coombes

## 2020-11-21 NOTE — Progress Notes (Signed)
I connected by phone with Tawny Asal on 11/21/2020, 11:59 AM to discuss the potential use of a new treatment, tixagevimab/cilgavimab, for pre-exposure prophylaxis for prevention of coronavirus disease 2019 (COVID-19) caused by the SARS-CoV-2 virus.  This patient is a 65 y.o. female that meets the FDA criteria for Emergency Use Authorization of tixagevimab/cilgavimab for pre-exposure prophylaxis of COVID-19 disease. Pt meets following criteria:  Age >12 yr and weight > 40kg  Not currently infected with SARS-CoV-2 and has no known recent exposure to an individual infected with SARS-CoV-2 AND o Who has moderate to severe immune compromise due to a medical condition or receipt of immunosuppressive medications or treatments and may not mount an adequate immune response to COVID-19 vaccination or  o Vaccination with any available COVID-19 vaccine, according to the approved or authorized schedule, is not recommended due to a history of severe adverse reaction (e.g., severe allergic reaction) to a COVID-19 vaccine(s) and/or COVID-19 vaccine component(s).  o Patient meets the following definition of mod-severe immune compromised status: 5. Autologous HCT, or other solid organ transplants, age > or = 65 yrs, and within 6 months of transplant  I have spoken and communicated the following to the patient or parent/caregiver regarding COVID monoclonal antibody treatment:  1. FDA has authorized the emergency use of tixagevimab/cilgavimab for the pre-exposure prophylaxis of COVID-19 in patients with moderate-severe immunocompromised status, who meet above EUA criteria.  2. The significant known and potential risks and benefits of COVID monoclonal antibody, and the extent to which such potential risks and benefits are unknown.  3. Information on available alternative treatments and the risks and benefits of those alternatives, including clinical trials.  4. The patient or parent/caregiver has the option to  accept or refuse COVID monoclonal antibody treatment.  After reviewing this information with the patient, agree to receive tixagevimab/cilgavimab.  Patient will receive on 12/07/2020 with her Zometa infusion.  Scot Dock, NP, 11/21/2020, 11:59 AM

## 2020-11-23 ENCOUNTER — Other Ambulatory Visit (HOSPITAL_COMMUNITY): Payer: Self-pay

## 2020-11-23 MED ORDER — LENALIDOMIDE 10 MG PO CAPS: 10.0000 mg | ORAL_CAPSULE | Freq: Every day | ORAL | 0 refills | Status: DC

## 2020-11-25 ENCOUNTER — Other Ambulatory Visit (HOSPITAL_COMMUNITY): Payer: Self-pay | Admitting: *Deleted

## 2020-11-25 MED ORDER — POTASSIUM CHLORIDE ER 10 MEQ PO TBCR: 10.0000 meq | EXTENDED_RELEASE_TABLET | Freq: Two times a day (BID) | ORAL | 3 refills | Status: DC

## 2020-11-29 ENCOUNTER — Other Ambulatory Visit (HOSPITAL_COMMUNITY): Payer: Self-pay

## 2020-11-29 DIAGNOSIS — C9000 Multiple myeloma not having achieved remission: Secondary | ICD-10-CM

## 2020-11-29 MED ORDER — POTASSIUM CHLORIDE ER 10 MEQ PO TBCR: 10.0000 meq | EXTENDED_RELEASE_TABLET | Freq: Two times a day (BID) | ORAL | 3 refills | Status: DC

## 2020-11-29 NOTE — Progress Notes (Signed)
Bone density order cancelled per Dr. Delton Coombes

## 2020-12-07 ENCOUNTER — Inpatient Hospital Stay (HOSPITAL_COMMUNITY): Payer: Medicare HMO

## 2020-12-07 ENCOUNTER — Other Ambulatory Visit: Payer: Self-pay

## 2020-12-07 ENCOUNTER — Encounter (HOSPITAL_COMMUNITY): Payer: Self-pay

## 2020-12-07 VITALS — BP 156/73 | HR 74 | Temp 97.3°F | Resp 18 | Wt 153.4 lb

## 2020-12-07 DIAGNOSIS — C9000 Multiple myeloma not having achieved remission: Secondary | ICD-10-CM

## 2020-12-07 LAB — CBC WITH DIFFERENTIAL/PLATELET
Abs Immature Granulocytes: 0.01 10*3/uL (ref 0.00–0.07)
Basophils Absolute: 0 10*3/uL (ref 0.0–0.1)
Basophils Relative: 1 %
Eosinophils Absolute: 0.3 10*3/uL (ref 0.0–0.5)
Eosinophils Relative: 12 %
HCT: 36.7 % (ref 36.0–46.0)
Hemoglobin: 11.4 g/dL — ABNORMAL LOW (ref 12.0–15.0)
Immature Granulocytes: 0 %
Lymphocytes Relative: 43 %
Lymphs Abs: 1.2 10*3/uL (ref 0.7–4.0)
MCH: 29.3 pg (ref 26.0–34.0)
MCHC: 31.1 g/dL (ref 30.0–36.0)
MCV: 94.3 fL (ref 80.0–100.0)
Monocytes Absolute: 0.2 10*3/uL (ref 0.1–1.0)
Monocytes Relative: 8 %
Neutro Abs: 1 10*3/uL — ABNORMAL LOW (ref 1.7–7.7)
Neutrophils Relative %: 36 %
Platelets: 175 10*3/uL (ref 150–400)
RBC: 3.89 MIL/uL (ref 3.87–5.11)
RDW: 15.1 % (ref 11.5–15.5)
WBC: 2.8 10*3/uL — ABNORMAL LOW (ref 4.0–10.5)
nRBC: 0 % (ref 0.0–0.2)

## 2020-12-07 LAB — COMPREHENSIVE METABOLIC PANEL
ALT: 33 U/L (ref 0–44)
AST: 28 U/L (ref 15–41)
Albumin: 3.7 g/dL (ref 3.5–5.0)
Alkaline Phosphatase: 142 U/L — ABNORMAL HIGH (ref 38–126)
Anion gap: 10 (ref 5–15)
BUN: 12 mg/dL (ref 8–23)
CO2: 22 mmol/L (ref 22–32)
Calcium: 8.7 mg/dL — ABNORMAL LOW (ref 8.9–10.3)
Chloride: 107 mmol/L (ref 98–111)
Creatinine, Ser: 0.75 mg/dL (ref 0.44–1.00)
GFR, Estimated: >60 mL/min (ref >60)
Glucose, Bld: 81 mg/dL (ref 70–99)
Potassium: 3.6 mmol/L (ref 3.5–5.1)
Sodium: 139 mmol/L (ref 135–145)
Total Bilirubin: 0.6 mg/dL (ref 0.3–1.2)
Total Protein: 6.8 g/dL (ref 6.5–8.1)

## 2020-12-07 LAB — MAGNESIUM: Magnesium: 1.8 mg/dL (ref 1.7–2.4)

## 2020-12-07 MED ORDER — CILGAVIMAB (PART OF EVUSHELD) INJECTION
300.0000 mg | Freq: Once | INTRAMUSCULAR | Status: AC
  Administered 2020-12-07: 300 mg via INTRAMUSCULAR
  Filled 2020-12-07: qty 3

## 2020-12-07 MED ORDER — TIXAGEVIMAB (PART OF EVUSHELD) INJECTION
300.0000 mg | Freq: Once | INTRAMUSCULAR | Status: AC
  Administered 2020-12-07: 300 mg via INTRAMUSCULAR
  Filled 2020-12-07: qty 3

## 2020-12-07 MED ORDER — ZOLEDRONIC ACID 4 MG/100ML IV SOLN
INTRAVENOUS | Status: AC
  Filled 2020-12-07: qty 100

## 2020-12-07 MED ORDER — SODIUM CHLORIDE 0.9 % IV SOLN: Freq: Once | INTRAVENOUS | Status: AC

## 2020-12-07 MED ORDER — ZOLEDRONIC ACID 4 MG/100ML IV SOLN
4.0000 mg | Freq: Once | INTRAVENOUS | Status: AC
  Administered 2020-12-07: 4 mg via INTRAVENOUS

## 2020-12-07 NOTE — Patient Instructions (Signed)
[place] at [facility]  Discharge Instructions:  Zometa and Evusheld injection _______________________________________________________________  Thank you for choosing [place] at [facility] to provide your oncology and hematology care.  To afford each patient quality time with our providers, please arrive at least 15 minutes before your scheduled appointment.  You need to re-schedule your appointment if you arrive 10 or more minutes late.  We strive to give you quality time with our providers, and arriving late affects you and other patients whose appointments are after yours.  Also, if you no show three or more times for appointments you may be dismissed from the clinic.  Again, thank you for choosing [place] at [facility] hope is that these requests will allow you access to exceptional care and in a timely manner. _______________________________________________________________  If you have questions after your visit, please contact our office at [phone] between the hours of 8:30 a.m. and 5:00 p.m. Voicemails left after 4:30 p.m. will not be returned until the following business day. _______________________________________________________________  For prescription refill requests, have your pharmacy contact our office. _______________________________________________________________  Recommendations made by the consultant and any test results will be sent to your referring physician. _______________________________________________________________

## 2020-12-07 NOTE — Progress Notes (Signed)
Patient presents today for Zometa and Evusheld injection 300mg . IM. Labs pending. Vital signs stable. Patient is taking Vitamin D, Ergocalciferol (Drisdol), 1.25 mg (50000 units) capsule.   Zometa given today per MD orders. Tolerated infusion without adverse affects. Evusheld fact sheet given to patient.  Vital signs stable. No complaints at this time. Discharged from clinic ambulatory in stable condition. Alert and oriented x 3. F/U with [facility] as scheduled.

## 2020-12-19 ENCOUNTER — Other Ambulatory Visit (HOSPITAL_COMMUNITY): Payer: Self-pay

## 2020-12-19 MED ORDER — LENALIDOMIDE 10 MG PO CAPS: 10.0000 mg | ORAL_CAPSULE | Freq: Every day | ORAL | 0 refills | Status: DC

## 2020-12-19 NOTE — Telephone Encounter (Signed)
Chart reviewed. Revlimid refilled per Dr. Delton Coombes

## 2020-12-21 ENCOUNTER — Encounter (HOSPITAL_COMMUNITY): Payer: Self-pay

## 2020-12-28 ENCOUNTER — Other Ambulatory Visit: Payer: Self-pay

## 2020-12-28 ENCOUNTER — Inpatient Hospital Stay (HOSPITAL_COMMUNITY): Payer: Medicare HMO | Attending: Hematology

## 2020-12-28 DIAGNOSIS — C9000 Multiple myeloma not having achieved remission: Secondary | ICD-10-CM | POA: Insufficient documentation

## 2020-12-28 DIAGNOSIS — R5383 Other fatigue: Secondary | ICD-10-CM | POA: Diagnosis not present

## 2020-12-28 DIAGNOSIS — M545 Low back pain, unspecified: Secondary | ICD-10-CM | POA: Insufficient documentation

## 2020-12-28 DIAGNOSIS — Z800 Family history of malignant neoplasm of digestive organs: Secondary | ICD-10-CM | POA: Insufficient documentation

## 2020-12-28 DIAGNOSIS — M546 Pain in thoracic spine: Secondary | ICD-10-CM | POA: Insufficient documentation

## 2020-12-28 DIAGNOSIS — Z8249 Family history of ischemic heart disease and other diseases of the circulatory system: Secondary | ICD-10-CM | POA: Insufficient documentation

## 2020-12-28 DIAGNOSIS — Z809 Family history of malignant neoplasm, unspecified: Secondary | ICD-10-CM | POA: Insufficient documentation

## 2020-12-28 DIAGNOSIS — Z79899 Other long term (current) drug therapy: Secondary | ICD-10-CM | POA: Diagnosis not present

## 2020-12-28 DIAGNOSIS — Z8379 Family history of other diseases of the digestive system: Secondary | ICD-10-CM | POA: Insufficient documentation

## 2020-12-28 DIAGNOSIS — C9001 Multiple myeloma in remission: Secondary | ICD-10-CM

## 2020-12-28 DIAGNOSIS — G629 Polyneuropathy, unspecified: Secondary | ICD-10-CM | POA: Diagnosis not present

## 2020-12-28 DIAGNOSIS — Z818 Family history of other mental and behavioral disorders: Secondary | ICD-10-CM | POA: Diagnosis not present

## 2020-12-28 DIAGNOSIS — Z833 Family history of diabetes mellitus: Secondary | ICD-10-CM | POA: Insufficient documentation

## 2020-12-28 DIAGNOSIS — R109 Unspecified abdominal pain: Secondary | ICD-10-CM | POA: Insufficient documentation

## 2020-12-28 LAB — CBC WITH DIFFERENTIAL/PLATELET
Abs Immature Granulocytes: 0.01 10*3/uL (ref 0.00–0.07)
Basophils Absolute: 0.1 10*3/uL (ref 0.0–0.1)
Basophils Relative: 2 %
Eosinophils Absolute: 0.4 10*3/uL (ref 0.0–0.5)
Eosinophils Relative: 16 %
HCT: 35.7 % — ABNORMAL LOW (ref 36.0–46.0)
Hemoglobin: 11.3 g/dL — ABNORMAL LOW (ref 12.0–15.0)
Immature Granulocytes: 0 %
Lymphocytes Relative: 41 %
Lymphs Abs: 1.1 10*3/uL (ref 0.7–4.0)
MCH: 30.1 pg (ref 26.0–34.0)
MCHC: 31.7 g/dL (ref 30.0–36.0)
MCV: 95.2 fL (ref 80.0–100.0)
Monocytes Absolute: 0.2 10*3/uL (ref 0.1–1.0)
Monocytes Relative: 8 %
Neutro Abs: 0.9 10*3/uL — ABNORMAL LOW (ref 1.7–7.7)
Neutrophils Relative %: 33 %
Platelets: 141 10*3/uL — ABNORMAL LOW (ref 150–400)
RBC: 3.75 MIL/uL — ABNORMAL LOW (ref 3.87–5.11)
RDW: 15.1 % (ref 11.5–15.5)
WBC: 2.8 10*3/uL — ABNORMAL LOW (ref 4.0–10.5)
nRBC: 0 % (ref 0.0–0.2)

## 2020-12-28 LAB — COMPREHENSIVE METABOLIC PANEL
ALT: 30 U/L (ref 0–44)
AST: 23 U/L (ref 15–41)
Albumin: 3.5 g/dL (ref 3.5–5.0)
Alkaline Phosphatase: 123 U/L (ref 38–126)
Anion gap: 7 (ref 5–15)
BUN: 15 mg/dL (ref 8–23)
CO2: 26 mmol/L (ref 22–32)
Calcium: 9 mg/dL (ref 8.9–10.3)
Chloride: 108 mmol/L (ref 98–111)
Creatinine, Ser: 0.81 mg/dL (ref 0.44–1.00)
GFR, Estimated: >60 mL/min (ref >60)
Glucose, Bld: 90 mg/dL (ref 70–99)
Potassium: 3.6 mmol/L (ref 3.5–5.1)
Sodium: 141 mmol/L (ref 135–145)
Total Bilirubin: 0.7 mg/dL (ref 0.3–1.2)
Total Protein: 6.6 g/dL (ref 6.5–8.1)

## 2020-12-28 LAB — MAGNESIUM: Magnesium: 1.7 mg/dL (ref 1.7–2.4)

## 2020-12-29 LAB — KAPPA/LAMBDA LIGHT CHAINS
Kappa free light chain: 22.8 mg/L — ABNORMAL HIGH (ref 3.3–19.4)
Kappa, lambda light chain ratio: 1.01 (ref 0.26–1.65)
Lambda free light chains: 22.5 mg/L (ref 5.7–26.3)

## 2020-12-30 LAB — PROTEIN ELECTROPHORESIS, SERUM
A/G Ratio: 1.1 (ref 0.7–1.7)
Albumin ELP: 3.3 g/dL (ref 2.9–4.4)
Alpha-1-Globulin: 0.2 g/dL (ref 0.0–0.4)
Alpha-2-Globulin: 0.7 g/dL (ref 0.4–1.0)
Beta Globulin: 1 g/dL (ref 0.7–1.3)
Gamma Globulin: 1 g/dL (ref 0.4–1.8)
Globulin, Total: 3 g/dL (ref 2.2–3.9)
Total Protein ELP: 6.3 g/dL (ref 6.0–8.5)

## 2021-01-04 ENCOUNTER — Inpatient Hospital Stay (HOSPITAL_COMMUNITY): Payer: Medicare HMO

## 2021-01-04 ENCOUNTER — Other Ambulatory Visit: Payer: Self-pay

## 2021-01-04 ENCOUNTER — Inpatient Hospital Stay (HOSPITAL_COMMUNITY): Payer: Medicare HMO | Admitting: Hematology

## 2021-01-04 VITALS — BP 150/72 | HR 67 | Temp 96.8°F | Resp 17

## 2021-01-04 VITALS — BP 166/72 | HR 68 | Temp 97.3°F | Resp 18 | Wt 152.1 lb

## 2021-01-04 DIAGNOSIS — C9001 Multiple myeloma in remission: Secondary | ICD-10-CM

## 2021-01-04 DIAGNOSIS — C9000 Multiple myeloma not having achieved remission: Secondary | ICD-10-CM

## 2021-01-04 MED ORDER — ZOLEDRONIC ACID 4 MG/100ML IV SOLN
4.0000 mg | Freq: Once | INTRAVENOUS | Status: AC
  Administered 2021-01-04: 4 mg via INTRAVENOUS
  Filled 2021-01-04: qty 100

## 2021-01-04 MED ORDER — SODIUM CHLORIDE 0.9 % IV SOLN: Freq: Once | INTRAVENOUS | Status: AC

## 2021-01-04 NOTE — Patient Instructions (Signed)
[facility]  Discharge Instructions: Thank you for choosing [facility] to provide your oncology and hematology care.  If you have a lab appointment with the [facility], please come in thru the Main Entrance and check in at the main information desk.  Wear comfortable clothing and clothing appropriate for easy access to any Portacath or PICC line.   We strive to give you quality time with your provider. You may need to reschedule your appointment if you arrive late (15 or more minutes).  Arriving late affects you and other patients whose appointments are after yours.  Also, if you miss three or more appointments without notifying the office, you may be dismissed from the clinic at the provider's discretion.      For prescription refill requests, have your pharmacy contact our office and allow 72 hours for refills to be completed.    Zometa today.  Follow up as scheduled.  Please call the clinic if you have any questions or concerns.    To help prevent nausea and vomiting after your treatment, we encourage you to take your nausea medication as directed.  BELOW ARE SYMPTOMS THAT SHOULD BE REPORTED IMMEDIATELY: . *FEVER GREATER THAN 100.4 F (38 C) OR HIGHER . *CHILLS OR SWEATING . *NAUSEA AND VOMITING THAT IS NOT CONTROLLED WITH YOUR NAUSEA MEDICATION . *UNUSUAL SHORTNESS OF BREATH . *UNUSUAL BRUISING OR BLEEDING . *URINARY PROBLEMS (pain or burning when urinating, or frequent urination) . *BOWEL PROBLEMS (unusual diarrhea, constipation, pain near the anus) . TENDERNESS IN MOUTH AND THROAT WITH OR WITHOUT PRESENCE OF ULCERS (sore throat, sores in mouth, or a toothache) . UNUSUAL RASH, SWELLING OR PAIN  . UNUSUAL VAGINAL DISCHARGE OR ITCHING   Items with * indicate a potential emergency and should be followed up as soon as possible or go to the Emergency Department if any problems should occur.  Please show the CHEMOTHERAPY ALERT CARD or IMMUNOTHERAPY ALERT CARD  at check-in to the Emergency Department and triage nurse.  Should you have questions after your visit or need to cancel or reschedule your appointment, please contact [facility] [phone]  and follow the prompts.  Office hours are 8:00 a.m. to 4:30 p.m. Monday - Friday. Please note that voicemails left after 4:00 p.m. may not be returned until the following business day.  We are closed weekends and major holidays. You have access to a nurse at all times for urgent questions. Please call the main number to the clinic [phone] and follow the prompts.  For any non-urgent questions, you may also contact your provider using MyChart. We now offer e-Visits for anyone 28 and older to request care online for non-urgent symptoms. For details visit [email or web address].   Also download the MyChart app! Go to the app store, search "MyChart", open the app, select [facility], and log in with your MyChart username and password.  Due to Covid, a mask is required upon entering the hospital/clinic. If you do not have a mask, one will be given to you upon arrival. For doctor visits, patients may have 1 support person aged 13 or older with them. For treatment visits, patients cannot have anyone with them due to current Covid guidelines and our immunocompromised population.

## 2021-01-04 NOTE — Progress Notes (Signed)
Stephanie Galvan, [place] [address]   CLINIC:  Medical Oncology/Hematology  PCP:  [name], [facility] / [place] [address]  [phone]  REASON FOR VISIT:  Follow-up for multiple myeloma  PRIOR THERAPY:  1.RVDx 6 cycles from 08/19/2019 to 12/15/2019. 2. 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  CURRENT THERAPY: Revlimid 10 mg daily; Zometa weekly  INTERVAL HISTORY:  [name], a 65 y.o. female, returns for routine follow-up for her multiple myeloma. Makenzie was last seen on 11/09/2020.  Today she reports feeling okay. She is taking Revlimid 10 mg daily and she complains of having constant lower thoracic and lumbar back pain. She took [place] for her back pain 2 days ago. The back pain is worse she  She not taking gabapentin since the numbness in her feet is improved. She denies having pain in her jaw and is tolerating Zometa well. She reports having abdominal pain with her BM's which she gets every other day.   REVIEW OF SYSTEMS:  Review of Systems  Constitutional: Positive for fatigue (75%). Negative for appetite change.  Gastrointestinal: Positive for abdominal pain (w/ BM), constipation (BM QOD) and nausea.  Musculoskeletal: Positive for back pain (6/10 constant lumbar back pain).  Neurological: Positive for numbness.  All other systems reviewed and are negative.   PAST MEDICAL/SURGICAL HISTORY:  Past Medical History:  Diagnosis Date  . Breast cancer ([facility]) 2006   left breast  . Breast mass, right   . Compression fracture of cervical spine ([facility]) 06/18/2019   recent Chest xray stating had fx.  . High cholesterol   . History of bladder infections   . Hypertension   . Rash Aug 2013   Past Surgical History:  Procedure Laterality Date  . ABDOMINAL HYSTERECTOMY     just uterus removed  . BREAST BIOPSY    . BREAST LUMPECTOMY WITH RADIOACTIVE SEED LOCALIZATION Right 07/24/2018   Procedure: RIGHT BREAST LUMPECTOMY WITH  RADIOACTIVE SEED LOCALIZATION;  Surgeon: Coralie Keens, MD;  Location: [facility];  Service: General;  Laterality: Right;  . IR RADIOLOGIST EVAL & MGMT  07/28/2020  . MASTECTOMY  5/06   left  . PARTIAL HYSTERECTOMY    . PORT-A-CATH REMOVAL    . PORTACATH PLACEMENT      SOCIAL HISTORY:  Social History   Socioeconomic History  . Marital status: Married    Spouse name: Not on file  . Number of children: 2  . Years of education: Not on file  . Highest education level: Not on file  Occupational History    Employer: [employer]  Tobacco Use  . Smoking status: Never Smoker  . Smokeless tobacco: Never Used  Vaping Use  . Vaping Use: Never used  Substance and Sexual Activity  . Alcohol use: No  . Drug use: No  . Sexual activity: Yes    Birth control/protection: Surgical  Other Topics Concern  . Not on file  Social History Narrative  . Not on file   Social Determinants of Health   Financial Resource Strain: Low Risk   . Difficulty of Paying Living Expenses: Not hard at all  Food Insecurity: No Food Insecurity  . Worried About Charity fundraiser in the Last Year: Never true  . Ran Out of Food in the Last Year: Never true  Transportation Needs: No Transportation Needs  . Lack of Transportation (Medical): No  . Lack of Transportation (Non-Medical): No  Physical Activity: Inactive  .  Days of Exercise per Week: 0 days  . Minutes of Exercise per Session: 0 min  Stress: No Stress Concern Present  . Feeling of Stress : Not at all  Social Connections: Moderately Integrated  . Frequency of Communication with Friends and Family: More than three times a week  . Frequency of Social Gatherings with Friends and Family: Never  . Attends Religious Services: More than 4 times per year  . Active Member of Clubs or Organizations: No  . Attends Archivist Meetings: Never  . Marital Status: Married  Human resources officer Violence: Not At Risk  . Fear of Current or  Ex-Partner: No  . Emotionally Abused: No  . Physically Abused: No  . Sexually Abused: No    FAMILY HISTORY:  Family History  Problem Relation Age of Onset  . Dementia Mother   . Stomach cancer Father   . Cancer Brother   . Cancer Brother   . Diabetes Brother   . Colitis Daughter   . Hypertension Daughter   . Hypertension Daughter     CURRENT MEDICATIONS:  Current Outpatient Medications  Medication Sig Dispense Refill  . acyclovir (ZOVIRAX) 400 MG tablet TAKE 1 TABLET BY MOUTH TWICE A DAY 180 tablet 1  . calcium carbonate (TUMS - DOSED IN MG ELEMENTAL CALCIUM) 500 MG chewable tablet Chew 2 tablets by mouth 2 (two) times daily.    . furosemide (LASIX) 20 MG tablet Take 1 tablet (20 mg total) by mouth daily as needed for fluid. 30 tablet 6  . HYDROcodone-acetaminophen (NORCO) 10-325 MG tablet Take 1 tablet by mouth 2 (two) times daily as needed for moderate pain. 45 tablet 0  . lenalidomide (REVLIMID) 10 MG capsule Take 1 capsule (10 mg total) by mouth daily. [employer] Auth # [id number] 28 capsule 0  . magnesium oxide (MAG-OX) 400 (241.3 Mg) MG tablet Take 1 tablet (400 mg total) by mouth daily. 60 tablet 3  . ondansetron (ZOFRAN) 8 MG tablet Take by mouth.    . potassium chloride (KLOR-CON) 10 MEQ tablet Take 1 tablet (10 mEq total) by mouth 2 (two) times daily. 180 tablet 3  . prochlorperazine (COMPAZINE) 10 MG tablet Take 1 tablet (10 mg total) by mouth every 6 (six) hours as needed for nausea or vomiting. 45 tablet 2  . simethicone (MYLICON) 80 MG chewable tablet Chew by mouth.    . Vitamin D, Ergocalciferol, (DRISDOL) 1.25 MG (50000 UNIT) CAPS capsule TAKE 1 CAPSULE BY MOUTH ONE TIME PER WEEK 12 capsule 1  . fluticasone (FLOVENT HFA) 220 MCG/ACT inhaler Inhale 1 puff into the lungs daily as needed. 1 each 3  . omeprazole (PRILOSEC) 40 MG capsule Take by mouth.     No current facility-administered medications for this visit.    ALLERGIES:  No Known Allergies  PHYSICAL EXAM:   Performance status (ECOG): 1 - Symptomatic but completely ambulatory  Vitals:   01/04/21 1302 01/04/21 1320  BP: (!) 172/91 (!) 166/72  Pulse: 68   Resp: 18   Temp: (!) 97.3 F (36.3 C)   SpO2: 100%    Wt Readings from Last 3 Encounters:  01/04/21 152 lb 1.6 oz (69 kg)  12/07/20 153 lb 6.4 oz (69.6 kg)  11/09/20 156 lb 3.2 oz (70.9 kg)   Physical Exam Vitals reviewed.  Constitutional:      Appearance: Normal appearance.  Cardiovascular:     Rate and Rhythm: Normal rate and regular rhythm.     Pulses: Normal pulses.  Heart sounds: Normal heart sounds.  Pulmonary:     Effort: Pulmonary effort is normal.     Breath sounds: Normal breath sounds.  Abdominal:     Palpations: Abdomen is soft. There is no mass.     Tenderness: There is no abdominal tenderness.  Musculoskeletal:     Thoracic back: Bony tenderness present.     Lumbar back: Bony tenderness present.  Neurological:     General: No focal deficit present.     Mental Status: She is alert and oriented to person, place, and time.  Psychiatric:        Mood and Affect: Mood normal.        Behavior: Behavior normal.     LABORATORY DATA:  I have reviewed the labs as listed.  CBC Latest Ref Rng & Units 12/28/2020 12/07/2020 11/09/2020  WBC 4.0 - 10.5 K/uL 2.8(L) 2.8(L) 2.3(L)  Hemoglobin 12.0 - 15.0 g/dL 11.3(L) 11.4(L) 11.1(L)  Hematocrit 36.0 - 46.0 % 35.7(L) 36.7 35.9(L)  Platelets 150 - 400 K/uL 141(L) 175 165   CMP Latest Ref Rng & Units 12/28/2020 12/07/2020 11/09/2020  Glucose 70 - 99 mg/dL 90 81 80  BUN 8 - 23 mg/dL _0 Creatinine 0.44 - 1.00 mg/dL 0.81 0.75 0.71  Sodium 135 - 145 mmol/L 141 139 139  Potassium 3.5 - 5.1 mmol/L 3.6 3.6 3.7  Chloride 98 - 111 mmol/L 108 107 105  CO2 22 - 32 mmol/L _1 Calcium 8.9 - 10.3 mg/dL 9.0 8.7(L) 8.9  Total Protein 6.5 - 8.1 g/dL 6.6 6.8 6.5  Total Bilirubin 0.3 - 1.2 mg/dL 0.7 0.6 0.6  Alkaline Phos 38 - 126 U/L 123 142(H) 141(H)  AST 15 - 41 U/L 23 28 34   ALT 0 - 44 U/L 30 33 48(H)      Component Value Date/Time   RBC 3.75 (L) 12/28/2020 1346   MCV 95.2 12/28/2020 1346   MCH 30.1 12/28/2020 1346   MCHC 31.7 12/28/2020 1346   RDW 15.1 12/28/2020 1346   LYMPHSABS 1.1 12/28/2020 1346   MONOABS 0.2 12/28/2020 1346   EOSABS 0.4 12/28/2020 1346   BASOSABS 0.1 12/28/2020 1346   Lab Results  Component Value Date   TOTALPROTELP 6.3 12/28/2020   ALBUMINELP 3.3 12/28/2020   A1GS 0.2 12/28/2020   A2GS 0.7 12/28/2020   BETS 1.0 12/28/2020   GAMS 1.0 12/28/2020   MSPIKE Not Observed 12/28/2020   SPEI Comment 12/28/2020    Lab Results  Component Value Date   KPAFRELGTCHN 22.8 (H) 12/28/2020   LAMBDASER 22.5 12/28/2020   KAPLAMBRATIO 1.01 12/28/2020    DIAGNOSTIC IMAGING:  I have independently reviewed the scans and discussed with the patient. No results found.   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 Revlimid started on 10/06/2019 due to delay. -Velcade held since 12/15/2019 due to gastric symptoms, which have resolved completely. -She was evaluated by Dr. Jannifer Franklin. Recommended to have immediate bone marrow transplant. -She has compression fractures of T8, T10 and T12. Also L1 and L3 compression fractures. -Revlimid and dexamethasone continued until 03/22/2020. 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 -Revlimid 10 mg daily started around first week of December 2021.  2. Upper back pain: -MRI on 07/13/2020 shows T7, T8, T12, L1 and L2 compression/superior endplate fractures. Height loss is greatest and moderate at T7 and T8. No evidence of bone  lesions.   PLAN:  1. IgA lambda plasma cell myeloma: -She thinks her back pain is exacerbated by Revlimid. - I have recommended her to change her Revlimid to 10 mg 3 weeks on 1 week off. - He also has occasional cramping.  I have recommended trying  stool softener. - Reviewed labs from 12/28/2020 which showed normal SPEP, free light chains.  Prior immunofixation on 11/09/2020 was normal. - White count is slightly low at 2.8 with ANC of 900 likely from Revlimid. - RTC 2 months for follow-up.  2. Mid back pain: -She was previously evaluated by IR for vertebroplasty which was not recommended. - She continues to have some pain in the mid back region.  She is recommended to take gabapentin in the morning.  We will cut back on Revlimid to 3 weeks on 1 week off and see if it helps.  3. Bone protection: -Zometa was started back on 08/10/2020.  Continue monthly.  Calcium is normal.  4.ID prophylaxis: -Continue acyclovir and aspirin.  5. Neuropathy: -She is not taking gabapentin at nighttime.  I have told her to start taking it for her mid back pain in the mornings.  6. Hypomagnesemia: -Continue magnesium every other day.  Magnesium today is 1.7.  7.  Hypertension: - Her blood pressure is high at 172/91 today.  She will start back on HCTZ 25 mg daily.  8.  Posttransplant vaccination: - She will come back in August for Hib vaccine and DTaP.  Orders placed this encounter:  Orders Placed This Encounter  Procedures  . CBC with Differential/Platelet  . Comprehensive metabolic panel  . Immunofixation electrophoresis  . Kappa/lambda light chains  . Protein electrophoresis, serum     Derek Jack, MD [place] [phone]   I, Milinda Antis, am acting as a scribe for Dr. Sanda Linger.  I, Derek Jack MD, have reviewed the above documentation for accuracy and completeness, and I agree with the above.

## 2021-01-04 NOTE — Progress Notes (Signed)
Pt here for Zometa today every 28 days.  Last treatment was on 12/07/2020.  Calcium is 9.  Pt taking calcium with vitamin d.  No complaints to note.  New consent obtained for zometa.   Tolerated treatment well today without incidence.  Stable during and after treatment.  AVS reviewed.  Vital signs stable prior to discharge. Discharged in stable condition ambulatory.

## 2021-01-04 NOTE — Progress Notes (Signed)
Patient was assessed by Dr. Katragadda and labs have been reviewed.  Patient is okay to proceed with treatment today. Primary RN and pharmacy aware.   

## 2021-01-04 NOTE — Patient Instructions (Addendum)
[place] at [facility] Discharge Instructions  You were seen today by Dr. Delton Coombes. He went over your recent results. Your next vaccines will be in August. You received your Zometa injection today; continue getting your injection every month. Take gabapentin 1 tablet in the morning to see if your back pain improves. Start taking Revlimid for 3 weeks on with 1 week off to see if your back pain improves. Purchase stool softener over the counter and take daily for regular bowel movements. Start taking hydrochlorothiazide daily for your blood pressure. Dr. Delton Coombes will see you back in 2 months for labs and follow up.   Thank you for choosing [facility] at [facility] to provide your oncology and hematology care.  To afford each patient quality time with our provider, please arrive at least 15 minutes before your scheduled appointment time.   If you have a lab appointment with the [facility] please come in thru the Main Entrance and check in at the main information desk  You need to re-schedule your appointment should you arrive 10 or more minutes late.  We strive to give you quality time with our providers, and arriving late affects you and other patients whose appointments are after yours.  Also, if you no show three or more times for appointments you may be dismissed from the clinic at the providers discretion.     Again, thank you for choosing [facility].  Our hope is that these requests will decrease the amount of time that you wait before being seen by our physicians.       _____________________________________________________________  Should you have questions after your visit to [facility], please contact our office at [phone] between the hours of 8:00 a.m. and 4:30 p.m.  Voicemails left after 4:00 p.m. will not be returned until the following business day.  For prescription refill requests, have your  pharmacy contact our office and allow 72 hours.    Cancer Center Support Programs:   > Cancer Support Group  2nd Tuesday of the month 1pm-2pm, Journey Room

## 2021-01-13 ENCOUNTER — Other Ambulatory Visit (HOSPITAL_COMMUNITY): Payer: Self-pay | Admitting: Hematology

## 2021-01-13 DIAGNOSIS — C9001 Multiple myeloma in remission: Secondary | ICD-10-CM

## 2021-01-17 ENCOUNTER — Other Ambulatory Visit (HOSPITAL_COMMUNITY): Payer: Self-pay

## 2021-01-17 MED ORDER — LENALIDOMIDE 10 MG PO CAPS: 10.0000 mg | ORAL_CAPSULE | Freq: Every day | ORAL | 0 refills | Status: DC

## 2021-01-17 NOTE — Telephone Encounter (Signed)
Chart reviewed. Revlimid refilled for 3 weeks on/1 week off per Dr. Delton Coombes.

## 2021-02-01 ENCOUNTER — Other Ambulatory Visit: Payer: Self-pay

## 2021-02-01 ENCOUNTER — Encounter (HOSPITAL_COMMUNITY): Payer: Self-pay

## 2021-02-01 ENCOUNTER — Inpatient Hospital Stay (HOSPITAL_COMMUNITY): Payer: Medicare HMO

## 2021-02-01 ENCOUNTER — Inpatient Hospital Stay (HOSPITAL_COMMUNITY): Payer: Medicare HMO | Attending: Hematology

## 2021-02-01 VITALS — BP 137/62 | HR 67 | Temp 97.2°F | Resp 18 | Wt 157.8 lb

## 2021-02-01 DIAGNOSIS — C9000 Multiple myeloma not having achieved remission: Secondary | ICD-10-CM | POA: Diagnosis not present

## 2021-02-01 LAB — COMPREHENSIVE METABOLIC PANEL
ALT: 31 U/L (ref 0–44)
AST: 26 U/L (ref 15–41)
Albumin: 3.6 g/dL (ref 3.5–5.0)
Alkaline Phosphatase: 121 U/L (ref 38–126)
Anion gap: 6 (ref 5–15)
BUN: 16 mg/dL (ref 8–23)
CO2: 28 mmol/L (ref 22–32)
Calcium: 9.2 mg/dL (ref 8.9–10.3)
Chloride: 106 mmol/L (ref 98–111)
Creatinine, Ser: 0.71 mg/dL (ref 0.44–1.00)
GFR, Estimated: >60 mL/min (ref >60)
Glucose, Bld: 79 mg/dL (ref 70–99)
Potassium: 4 mmol/L (ref 3.5–5.1)
Sodium: 140 mmol/L (ref 135–145)
Total Bilirubin: 0.7 mg/dL (ref 0.3–1.2)
Total Protein: 6.6 g/dL (ref 6.5–8.1)

## 2021-02-01 LAB — CBC WITH DIFFERENTIAL/PLATELET
Abs Immature Granulocytes: 0.01 10*3/uL (ref 0.00–0.07)
Basophils Absolute: 0 10*3/uL (ref 0.0–0.1)
Basophils Relative: 1 %
Eosinophils Absolute: 0.2 10*3/uL (ref 0.0–0.5)
Eosinophils Relative: 9 %
HCT: 34.5 % — ABNORMAL LOW (ref 36.0–46.0)
Hemoglobin: 11 g/dL — ABNORMAL LOW (ref 12.0–15.0)
Immature Granulocytes: 1 %
Lymphocytes Relative: 46 %
Lymphs Abs: 0.9 10*3/uL (ref 0.7–4.0)
MCH: 31.2 pg (ref 26.0–34.0)
MCHC: 31.9 g/dL (ref 30.0–36.0)
MCV: 97.7 fL (ref 80.0–100.0)
Monocytes Absolute: 0.2 10*3/uL (ref 0.1–1.0)
Monocytes Relative: 8 %
Neutro Abs: 0.7 10*3/uL — ABNORMAL LOW (ref 1.7–7.7)
Neutrophils Relative %: 35 %
Platelets: 143 10*3/uL — ABNORMAL LOW (ref 150–400)
RBC: 3.53 MIL/uL — ABNORMAL LOW (ref 3.87–5.11)
RDW: 15.1 % (ref 11.5–15.5)
WBC: 1.9 10*3/uL — ABNORMAL LOW (ref 4.0–10.5)
nRBC: 0 % (ref 0.0–0.2)

## 2021-02-01 LAB — MAGNESIUM: Magnesium: 1.8 mg/dL (ref 1.7–2.4)

## 2021-02-01 MED ORDER — SODIUM CHLORIDE 0.9 % IV SOLN: Freq: Once | INTRAVENOUS | Status: AC

## 2021-02-01 MED ORDER — ZOLEDRONIC ACID 4 MG/100ML IV SOLN
4.0000 mg | Freq: Once | INTRAVENOUS | Status: AC
  Administered 2021-02-01: 4 mg via INTRAVENOUS
  Filled 2021-02-01: qty 100

## 2021-02-01 NOTE — Patient Instructions (Signed)
[facility]  Discharge Instructions: Thank you for choosing [facility] to provide your oncology and hematology care.  If you have a lab appointment with the [facility], please come in thru the Main Entrance and check in at the main information desk.  Wear comfortable clothing and clothing appropriate for easy access to any Portacath or PICC line.   We strive to give you quality time with your provider. You may need to reschedule your appointment if you arrive late (15 or more minutes).  Arriving late affects you and other patients whose appointments are after yours.  Also, if you miss three or more appointments without notifying the office, you may be dismissed from the clinic at the provider's discretion.      For prescription refill requests, have your pharmacy contact our office and allow 72 hours for refills to be completed.    Today you received the following chemotherapy and/or immunotherapy agents Zometa.        To help prevent nausea and vomiting after your treatment, we encourage you to take your nausea medication as directed.  BELOW ARE SYMPTOMS THAT SHOULD BE REPORTED IMMEDIATELY: . *FEVER GREATER THAN 100.4 F (38 C) OR HIGHER . *CHILLS OR SWEATING . *NAUSEA AND VOMITING THAT IS NOT CONTROLLED WITH YOUR NAUSEA MEDICATION . *UNUSUAL SHORTNESS OF BREATH . *UNUSUAL BRUISING OR BLEEDING . *URINARY PROBLEMS (pain or burning when urinating, or frequent urination) . *BOWEL PROBLEMS (unusual diarrhea, constipation, pain near the anus) . TENDERNESS IN MOUTH AND THROAT WITH OR WITHOUT PRESENCE OF ULCERS (sore throat, sores in mouth, or a toothache) . UNUSUAL RASH, SWELLING OR PAIN  . UNUSUAL VAGINAL DISCHARGE OR ITCHING   Items with * indicate a potential emergency and should be followed up as soon as possible or go to the Emergency Department if any problems should occur.  Please show the CHEMOTHERAPY ALERT CARD or IMMUNOTHERAPY ALERT CARD at check-in to  the Emergency Department and triage nurse.  Should you have questions after your visit or need to cancel or reschedule your appointment, please contact [facility] [phone]  and follow the prompts.  Office hours are 8:00 a.m. to 4:30 p.m. Monday - Friday. Please note that voicemails left after 4:00 p.m. may not be returned until the following business day.  We are closed weekends and major holidays. You have access to a nurse at all times for urgent questions. Please call the main number to the clinic [phone] and follow the prompts.  For any non-urgent questions, you may also contact your provider using MyChart. We now offer e-Visits for anyone 29 and older to request care online for non-urgent symptoms. For details visit [email or web address].   Also download the MyChart app! Go to the app store, search "MyChart", open the app, select [facility], and log in with your MyChart username and password.  Due to Covid, a mask is required upon entering the hospital/clinic. If you do not have a mask, one will be given to you upon arrival. For doctor visits, patients may have 1 support person aged 32 or older with them. For treatment visits, patients cannot have anyone with them due to current Covid guidelines and our immunocompromised population.

## 2021-02-01 NOTE — Progress Notes (Signed)
Patient tolerated therapy with no complaints voiced. Labs reviewed. Side effects with management reviewed with understanding verbalized. Peripheral IV site clean and dry with no bruising or swelling noted at site. Good blood return noted before and after administration of therapy. Band aid applied. Patient left in satisfactory condition with VSS and no s/s of distress noted.  

## 2021-02-14 ENCOUNTER — Encounter (HOSPITAL_COMMUNITY): Payer: Self-pay

## 2021-02-16 ENCOUNTER — Other Ambulatory Visit (HOSPITAL_COMMUNITY): Payer: Self-pay | Admitting: Hematology

## 2021-02-16 DIAGNOSIS — C9001 Multiple myeloma in remission: Secondary | ICD-10-CM

## 2021-02-20 ENCOUNTER — Encounter (HOSPITAL_COMMUNITY): Payer: Self-pay | Admitting: Hematology

## 2021-02-20 NOTE — Telephone Encounter (Signed)
Chart reviewed. Revlimid refilled per Dr. Delton Coombes

## 2021-02-23 ENCOUNTER — Other Ambulatory Visit: Payer: Self-pay

## 2021-02-23 ENCOUNTER — Inpatient Hospital Stay (HOSPITAL_COMMUNITY): Payer: Medicare HMO | Attending: Hematology

## 2021-02-23 DIAGNOSIS — C9001 Multiple myeloma in remission: Secondary | ICD-10-CM

## 2021-02-23 DIAGNOSIS — C9000 Multiple myeloma not having achieved remission: Secondary | ICD-10-CM | POA: Diagnosis present

## 2021-02-23 DIAGNOSIS — Z809 Family history of malignant neoplasm, unspecified: Secondary | ICD-10-CM | POA: Insufficient documentation

## 2021-02-23 DIAGNOSIS — Z833 Family history of diabetes mellitus: Secondary | ICD-10-CM | POA: Insufficient documentation

## 2021-02-23 DIAGNOSIS — Z818 Family history of other mental and behavioral disorders: Secondary | ICD-10-CM | POA: Diagnosis not present

## 2021-02-23 DIAGNOSIS — D61818 Other pancytopenia: Secondary | ICD-10-CM | POA: Diagnosis not present

## 2021-02-23 DIAGNOSIS — Z853 Personal history of malignant neoplasm of breast: Secondary | ICD-10-CM | POA: Diagnosis not present

## 2021-02-23 DIAGNOSIS — M549 Dorsalgia, unspecified: Secondary | ICD-10-CM | POA: Insufficient documentation

## 2021-02-23 DIAGNOSIS — Z79899 Other long term (current) drug therapy: Secondary | ICD-10-CM | POA: Diagnosis not present

## 2021-02-23 DIAGNOSIS — Z800 Family history of malignant neoplasm of digestive organs: Secondary | ICD-10-CM | POA: Insufficient documentation

## 2021-02-23 DIAGNOSIS — Z8249 Family history of ischemic heart disease and other diseases of the circulatory system: Secondary | ICD-10-CM | POA: Insufficient documentation

## 2021-02-23 DIAGNOSIS — Z8379 Family history of other diseases of the digestive system: Secondary | ICD-10-CM | POA: Insufficient documentation

## 2021-02-23 LAB — CBC WITH DIFFERENTIAL/PLATELET
Abs Immature Granulocytes: 0 10*3/uL (ref 0.00–0.07)
Basophils Absolute: 0 10*3/uL (ref 0.0–0.1)
Basophils Relative: 1 %
Eosinophils Absolute: 0 10*3/uL (ref 0.0–0.5)
Eosinophils Relative: 1 %
HCT: 34.8 % — ABNORMAL LOW (ref 36.0–46.0)
Hemoglobin: 11.4 g/dL — ABNORMAL LOW (ref 12.0–15.0)
Immature Granulocytes: 0 %
Lymphocytes Relative: 47 %
Lymphs Abs: 1.4 10*3/uL (ref 0.7–4.0)
MCH: 31.8 pg (ref 26.0–34.0)
MCHC: 32.8 g/dL (ref 30.0–36.0)
MCV: 96.9 fL (ref 80.0–100.0)
Monocytes Absolute: 0.3 10*3/uL (ref 0.1–1.0)
Monocytes Relative: 11 %
Neutro Abs: 1.2 10*3/uL — ABNORMAL LOW (ref 1.7–7.7)
Neutrophils Relative %: 40 %
Platelets: 189 10*3/uL (ref 150–400)
RBC: 3.59 MIL/uL — ABNORMAL LOW (ref 3.87–5.11)
RDW: 14.6 % (ref 11.5–15.5)
WBC: 2.9 10*3/uL — ABNORMAL LOW (ref 4.0–10.5)
nRBC: 0 % (ref 0.0–0.2)

## 2021-02-23 LAB — COMPREHENSIVE METABOLIC PANEL
ALT: 41 U/L (ref 0–44)
AST: 29 U/L (ref 15–41)
Albumin: 3.6 g/dL (ref 3.5–5.0)
Alkaline Phosphatase: 139 U/L — ABNORMAL HIGH (ref 38–126)
Anion gap: 5 (ref 5–15)
BUN: 18 mg/dL (ref 8–23)
CO2: 28 mmol/L (ref 22–32)
Calcium: 8.8 mg/dL — ABNORMAL LOW (ref 8.9–10.3)
Chloride: 106 mmol/L (ref 98–111)
Creatinine, Ser: 0.79 mg/dL (ref 0.44–1.00)
GFR, Estimated: >60 mL/min (ref >60)
Glucose, Bld: 97 mg/dL (ref 70–99)
Potassium: 3.5 mmol/L (ref 3.5–5.1)
Sodium: 139 mmol/L (ref 135–145)
Total Bilirubin: 0.5 mg/dL (ref 0.3–1.2)
Total Protein: 6.8 g/dL (ref 6.5–8.1)

## 2021-02-24 LAB — KAPPA/LAMBDA LIGHT CHAINS
Kappa free light chain: 22.4 mg/L — ABNORMAL HIGH (ref 3.3–19.4)
Kappa, lambda light chain ratio: 1.1 (ref 0.26–1.65)
Lambda free light chains: 20.4 mg/L (ref 5.7–26.3)

## 2021-02-25 LAB — PROTEIN ELECTROPHORESIS, SERUM
A/G Ratio: 1.2 (ref 0.7–1.7)
Albumin ELP: 3.5 g/dL (ref 2.9–4.4)
Alpha-1-Globulin: 0.3 g/dL (ref 0.0–0.4)
Alpha-2-Globulin: 0.7 g/dL (ref 0.4–1.0)
Beta Globulin: 1 g/dL (ref 0.7–1.3)
Gamma Globulin: 1 g/dL (ref 0.4–1.8)
Globulin, Total: 3 g/dL (ref 2.2–3.9)
Total Protein ELP: 6.5 g/dL (ref 6.0–8.5)

## 2021-02-27 LAB — IMMUNOFIXATION ELECTROPHORESIS
IgA: 86 mg/dL — ABNORMAL LOW (ref 87–352)
IgG (Immunoglobin G), Serum: 975 mg/dL (ref 586–1602)
IgM (Immunoglobulin M), Srm: 31 mg/dL (ref 26–217)
Total Protein ELP: 6.2 g/dL (ref 6.0–8.5)

## 2021-03-01 ENCOUNTER — Encounter (HOSPITAL_COMMUNITY): Payer: Self-pay | Admitting: Hematology and Oncology

## 2021-03-01 ENCOUNTER — Inpatient Hospital Stay (HOSPITAL_COMMUNITY): Payer: Medicare HMO | Admitting: Hematology and Oncology

## 2021-03-01 ENCOUNTER — Other Ambulatory Visit: Payer: Self-pay

## 2021-03-01 ENCOUNTER — Encounter (HOSPITAL_COMMUNITY): Payer: Self-pay

## 2021-03-01 ENCOUNTER — Inpatient Hospital Stay (HOSPITAL_COMMUNITY): Payer: Medicare HMO

## 2021-03-01 VITALS — BP 158/74 | HR 88 | Temp 97.2°F | Resp 18 | Wt 152.8 lb

## 2021-03-01 VITALS — BP 134/69 | HR 68 | Resp 18

## 2021-03-01 DIAGNOSIS — C9000 Multiple myeloma not having achieved remission: Secondary | ICD-10-CM | POA: Diagnosis not present

## 2021-03-01 DIAGNOSIS — D61818 Other pancytopenia: Secondary | ICD-10-CM

## 2021-03-01 DIAGNOSIS — C50912 Malignant neoplasm of unspecified site of left female breast: Secondary | ICD-10-CM | POA: Diagnosis not present

## 2021-03-01 DIAGNOSIS — Z171 Estrogen receptor negative status [ER-]: Secondary | ICD-10-CM

## 2021-03-01 MED ORDER — SODIUM CHLORIDE 0.9 % IV SOLN
Freq: Once | INTRAVENOUS | Status: AC
Start: 2021-03-01 — End: 2021-03-01

## 2021-03-01 MED ORDER — ZOLEDRONIC ACID 4 MG/100ML IV SOLN
INTRAVENOUS | Status: AC
  Filled 2021-03-01: qty 100

## 2021-03-01 MED ORDER — ZOLEDRONIC ACID 4 MG/100ML IV SOLN
4.0000 mg | Freq: Once | INTRAVENOUS | Status: AC
  Administered 2021-03-01: 4 mg via INTRAVENOUS

## 2021-03-01 NOTE — Progress Notes (Signed)
Patient is here today for her zometa.  She has been seen by Dr. Alvy Bimler.  Calcium 8.8, okay to give zometa today.  Patient reports doing well overall.  She is regaining her strength and able to eat well.  She feels good.  She is taking her revlimid and has not missed any doses. She denies any side effects.    She remained stable during her zometa infusion. She denies any issues.  She was discharged ambulatory and in stable condition from clinic. She will follow up as scheduled.

## 2021-03-01 NOTE — Assessment & Plan Note (Signed)
Overall, she tolerated treatment very well She will continue treatment as scheduled I reminded her the importance of taking vitamin D and calcium supplement She had negative dental evaluation recently I recommend checking her vitamin D level next visit before refilling her high-dose prescription vitamin D supplement She is in agreement

## 2021-03-01 NOTE — Assessment & Plan Note (Signed)
Her last mammogram from October show no evidence of disease She will continue active surveillance mammogram

## 2021-03-01 NOTE — Patient Instructions (Signed)
[facility]  Discharge Instructions: Thank you for choosing [facility] to provide your oncology and hematology care.  If you have a lab appointment with the [facility], please come in thru the Main Entrance and check in at the main information desk.  Wear comfortable clothing and clothing appropriate for easy access to any Portacath or PICC line.   We strive to give you quality time with your provider. You may need to reschedule your appointment if you arrive late (15 or more minutes).  Arriving late affects you and other patients whose appointments are after yours.  Also, if you miss three or more appointments without notifying the office, you may be dismissed from the clinic at the provider's discretion.      For prescription refill requests, have your pharmacy contact our office and allow 72 hours for refills to be completed.    Today you received the following medication:  Zometa    To help prevent nausea and vomiting after your treatment, we encourage you to take your nausea medication as directed.  BELOW ARE SYMPTOMS THAT SHOULD BE REPORTED IMMEDIATELY: *FEVER GREATER THAN 100.4 F (38 C) OR HIGHER *CHILLS OR SWEATING *NAUSEA AND VOMITING THAT IS NOT CONTROLLED WITH YOUR NAUSEA MEDICATION *UNUSUAL SHORTNESS OF BREATH *UNUSUAL BRUISING OR BLEEDING *URINARY PROBLEMS (pain or burning when urinating, or frequent urination) *BOWEL PROBLEMS (unusual diarrhea, constipation, pain near the anus) TENDERNESS IN MOUTH AND THROAT WITH OR WITHOUT PRESENCE OF ULCERS (sore throat, sores in mouth, or a toothache) UNUSUAL RASH, SWELLING OR PAIN  UNUSUAL VAGINAL DISCHARGE OR ITCHING   Items with * indicate a potential emergency and should be followed up as soon as possible or go to the Emergency Department if any problems should occur.  Please show the CHEMOTHERAPY ALERT CARD or IMMUNOTHERAPY ALERT CARD at check-in to the Emergency Department and triage nurse.  Should  you have questions after your visit or need to cancel or reschedule your appointment, please contact [facility] [phone]  and follow the prompts.  Office hours are 8:00 a.m. to 4:30 p.m. Monday - Friday. Please note that voicemails left after 4:00 p.m. may not be returned until the following business day.  We are closed weekends and major holidays. You have access to a nurse at all times for urgent questions. Please call the main number to the clinic [phone] and follow the prompts.  For any non-urgent questions, you may also contact your provider using MyChart. We now offer e-Visits for anyone 47 and older to request care online for non-urgent symptoms. For details visit [email or web address].   Also download the MyChart app! Go to the app store, search "MyChart", open the app, select [facility], and log in with your MyChart username and password.  Due to Covid, a mask is required upon entering the hospital/clinic. If you do not have a mask, one will be given to you upon arrival. For doctor visits, patients may have 1 support person aged 37 or older with them. For treatment visits, patients cannot have anyone with them due to current Covid guidelines and our immunocompromised population.

## 2021-03-01 NOTE — Assessment & Plan Note (Signed)
She has mild acquired pancytopenia due to treatment She is not symptomatic She will continue treatment as prescribed

## 2021-03-01 NOTE — Progress Notes (Signed)
[facility] FOLLOW-UP progress notes  Patient Care Team: [name], MD as PCP - General (Family Medicine) Derek Jack, MD as Consulting Physician (Hematology) Jeanann Lewandowsky, MD as Referring Physician (Internal Medicine)  CHIEF COMPLAINTS/PURPOSE OF VISIT:  Multiple myeloma, for further evaluation and treatment History of breast cancer status post surgery and chemotherapy  HISTORY OF PRESENTING ILLNESS:  Stephanie Galvan 65 y.o. female is seen today because her primary oncologist is not available She is receiving treatment for multiple myeloma She is doing well She denies recent infection, fever or chills The back pain is stable/improved She denies recent dental issues She denies any recent abnormal breast examination, palpable mass, abnormal breast appearance or nipple changes  I reviewed the patient's records extensive and collaborated the history with the patient. Summary of her history is as follows: Oncology History  Multiple myeloma ([place])  08/22/2019 Initial Diagnosis   Multiple myeloma ([place])    08/22/2019 Cancer Staging   Staging form: Plasma Cell Myeloma and Plasma Cell Disorders, AJCC 8th Edition - Clinical stage from 08/22/2019: RISS Stage II (Beta-2-microglobulin (mg/L): 3.8, Albumin (g/dL): 3.2, ISS: Stage II, High-risk cytogenetics: Absent, LDH: Normal) - Signed by Derek Jack, MD on 08/22/2019      MEDICAL HISTORY:  Past Medical History:  Diagnosis Date   Breast cancer ([place]) 2006   left breast   Breast mass, right    Compression fracture of cervical spine ([place]) 06/18/2019   recent Chest xray stating had fx.   High cholesterol    History of bladder infections    Hypertension    Rash Aug 2013    SURGICAL HISTORY: Past Surgical History:  Procedure Laterality Date   ABDOMINAL HYSTERECTOMY     just uterus removed   BREAST BIOPSY     BREAST LUMPECTOMY WITH RADIOACTIVE SEED LOCALIZATION Right 07/24/2018    Procedure: RIGHT BREAST LUMPECTOMY WITH RADIOACTIVE SEED LOCALIZATION;  Surgeon: Coralie Keens, MD;  Location: [facility];  Service: General;  Laterality: Right;   IR RADIOLOGIST EVAL & MGMT  07/28/2020   MASTECTOMY  5/06   left   PARTIAL HYSTERECTOMY     PORT-A-CATH REMOVAL     PORTACATH PLACEMENT      SOCIAL HISTORY: Social History   Socioeconomic History   Marital status: Married    Spouse name: Not on file   Number of children: 2   Years of education: Not on file   Highest education level: Not on file  Occupational History    Employer: [employer]  Tobacco Use   Smoking status: Never   Smokeless tobacco: Never  Vaping Use   Vaping Use: Never used  Substance and Sexual Activity   Alcohol use: No   Drug use: No   Sexual activity: Yes    Birth control/protection: Surgical  Other Topics Concern   Not on file  Social History Narrative   Not on file   Social Determinants of Health   Financial Resource Strain: Low Risk    Difficulty of Paying Living Expenses: Not hard at all  Food Insecurity: No Food Insecurity   Worried About Charity fundraiser in the Last Year: Never true   Arboriculturist in the Last Year: Never true  Transportation Needs: No Transportation Needs   Lack of Transportation (Medical): No   Lack of Transportation (Non-Medical): No  Physical Activity: Inactive   Days of Exercise per Week: 0 days   Minutes of Exercise per Session: 0 min  Stress:  No Stress Concern Present   Feeling of Stress : Not at all  Social Connections: Moderately Integrated   Frequency of Communication with Friends and Family: More than three times a week   Frequency of Social Gatherings with Friends and Family: Never   Attends Religious Services: More than 4 times per year   Active Member of [employer] or Organizations: No   Attends Music therapist: Never   Marital Status: Married  Human resources officer Violence: Not At Risk   Fear of Current or  Ex-Partner: No   Emotionally Abused: No   Physically Abused: No   Sexually Abused: No    FAMILY HISTORY: Family History  Problem Relation Age of Onset   Dementia Mother    Stomach cancer Father    Cancer Brother    Cancer Brother    Diabetes Brother    Colitis Daughter    Hypertension Daughter    Hypertension Daughter     ALLERGIES:  has No Known Allergies.  MEDICATIONS:  Current Outpatient Medications  Medication Sig Dispense Refill   aspirin EC 81 MG tablet Take 81 mg by mouth daily. Swallow whole.     furosemide (LASIX) 20 MG tablet Take 1 tablet (20 mg total) by mouth daily as needed for fluid. 30 tablet 6   HYDROcodone-acetaminophen (NORCO) 10-325 MG tablet Take 1 tablet by mouth 2 (two) times daily as needed for moderate pain. 45 tablet 0   magnesium oxide (MAG-OX) 400 (241.3 Mg) MG tablet Take 1 tablet (400 mg total) by mouth daily. 60 tablet 3   potassium chloride (KLOR-CON) 10 MEQ tablet Take 1 tablet (10 mEq total) by mouth 2 (two) times daily. 180 tablet 3   prochlorperazine (COMPAZINE) 10 MG tablet Take 1 tablet (10 mg total) by mouth every 6 (six) hours as needed for nausea or vomiting. 45 tablet 2   REVLIMID 10 MG capsule TAKE 1 CAPSULE BY MOUTH DAILY FOR 3 WEEKS ON, 1 WEEK OFF FOR 28 DAY SUPPLY 21 capsule 0   Vitamin D, Ergocalciferol, (DRISDOL) 1.25 MG (50000 UNIT) CAPS capsule TAKE 1 CAPSULE BY MOUTH ONE TIME PER WEEK 12 capsule 1   calcium carbonate (TUMS - DOSED IN MG ELEMENTAL CALCIUM) 500 MG chewable tablet Chew 2 tablets by mouth 2 (two) times daily.     No current facility-administered medications for this visit.    REVIEW OF SYSTEMS:   Constitutional: Denies fevers, chills or abnormal night sweats Eyes: Denies blurriness of vision, double vision or watery eyes Ears, nose, mouth, throat, and face: Denies mucositis or sore throat Respiratory: Denies cough, dyspnea or wheezes Cardiovascular: Denies palpitation, chest discomfort or lower extremity  swelling Gastrointestinal:  Denies nausea, heartburn or change in bowel habits Skin: Denies abnormal skin rashes Lymphatics: Denies new lymphadenopathy or easy bruising Neurological:Denies numbness, tingling or new weaknesses Behavioral/Psych: Mood is stable, no new changes  All other systems were reviewed with the patient and are negative.  PHYSICAL EXAMINATION: ECOG PERFORMANCE STATUS: 1 - Symptomatic but completely ambulatory  Vitals:   03/01/21 1255  BP: (!) 158/74  Pulse: 88  Resp: 18  Temp: (!) 97.2 F (36.2 C)  SpO2: 100%   Filed Weights   03/01/21 1255  Weight: 152 lb 12.8 oz (69.3 kg)    GENERAL:alert, no distress and comfortable SKIN: skin color, texture, turgor are normal, no rashes or significant lesions EYES: normal, conjunctiva are pink and non-injected, sclera clear OROPHARYNX:no exudate, normal lips, buccal mucosa, and tongue  NECK: supple, thyroid normal  size, non-tender, without nodularity LYMPH:  no palpable lymphadenopathy in the cervical, axillary or inguinal LUNGS: clear to auscultation and percussion with normal breathing effort HEART: regular rate & rhythm and no murmurs without lower extremity edema ABDOMEN:abdomen soft, non-tender and normal bowel sounds Musculoskeletal:no cyanosis of digits and no clubbing  PSYCH: alert & oriented x 3 with fluent speech NEURO: no focal motor/sensory deficits  LABORATORY DATA:  I have reviewed the data as listed Lab Results  Component Value Date   WBC 2.9 (L) 02/23/2021   HGB 11.4 (L) 02/23/2021   HCT 34.8 (L) 02/23/2021   MCV 96.9 02/23/2021   PLT 189 02/23/2021   Recent Labs    05/30/20 1027 06/06/20 1209 06/13/20 1127 07/05/20 0948 12/28/20 1346 02/01/21 1218 02/23/21 1311  NA 138 139 138   < > 141 140 139  K 3.9 4.0 4.2   < > 3.6 4.0 3.5  CL 104 106 104   < > 108 106 106  CO2 26 25 25    < > 26 28 28   GLUCOSE 125* 97 90   < > 90 79 97  BUN 8 9 13    < > 15 16 18   CREATININE 0.75 0.68 0.68   <  > 0.81 0.71 0.79  CALCIUM 9.6 9.1 9.3   < > 9.0 9.2 8.8*  GFRNONAA >60 >60 >60   < > >60 >60 >60  GFRAA >60 >60 >60  --   --   --   --   PROT 6.7 6.4* 6.1*   < > 6.6 6.6 6.8  ALBUMIN 3.5 3.5 3.8   < > 3.5 3.6 3.6  AST 26 24 24    < > 23 26 29   ALT 17 17 15    < > 30 31 41  ALKPHOS 171* 147* 128*   < > 123 121 139*  BILITOT 0.6 0.5 0.6   < > 0.7 0.7 0.5   < > = values in this interval not displayed.    ASSESSMENT & PLAN:  Multiple myeloma ([place]) Overall, she tolerated treatment very well She will continue treatment as scheduled I reminded her the importance of taking vitamin D and calcium supplement She had negative dental evaluation recently I recommend checking her vitamin D level next visit before refilling her high-dose prescription vitamin D supplement She is in agreement  Breast cancer, left ([place]) Her last mammogram from October show no evidence of disease She will continue active surveillance mammogram  Pancytopenia, acquired ([place]) She has mild acquired pancytopenia due to treatment She is not symptomatic She will continue treatment as prescribed  Orders Placed This Encounter  Procedures   Vitamin D 25 hydroxy    Standing Status:   Future    Standing Expiration Date:   03/01/2022   CBC with Differential    Standing Status:   Future    Standing Expiration Date:   03/01/2022   Comprehensive metabolic panel    Standing Status:   Future    Standing Expiration Date:   03/01/2022   Lactate dehydrogenase    Standing Status:   Future    Standing Expiration Date:   03/01/2022   Kappa/lambda light chains    Standing Status:   Future    Standing Expiration Date:   03/01/2022   Immunofixation electrophoresis    Standing Status:   Future    Standing Expiration Date:   03/01/2022   Protein electrophoresis, serum    Standing Status:   Future    Standing  Expiration Date:   03/01/2022   Magnesium    Standing Status:   Future    Standing Expiration Date:   03/01/2022    All  questions were answered. The patient knows to call the clinic with any problems, questions or concerns. The total time spent in the appointment was 20 minutes encounter with patients including review of chart and various tests results, discussions about plan of care and coordination of care plan   Heath Lark, MD 03/01/2021 2:05 PM

## 2021-03-03 ENCOUNTER — Encounter (HOSPITAL_COMMUNITY): Payer: Self-pay

## 2021-03-08 ENCOUNTER — Encounter (HOSPITAL_COMMUNITY): Payer: Self-pay

## 2021-03-08 NOTE — Progress Notes (Signed)
Patient presents to clinic today requesting lab work be drawn per [facility]'s instructions and mailed appropriately. Patient labs drawn from [facility] using 22G butterfly needle. Patient tolerated well. Pressure applied to site and bandaid in place. Patient discharged from clinic ambulatory and in satisfactory condition. Labs labeled and shipped to [facility] per instructions provided.

## 2021-03-13 ENCOUNTER — Encounter (HOSPITAL_COMMUNITY): Payer: Self-pay

## 2021-03-14 ENCOUNTER — Other Ambulatory Visit (HOSPITAL_COMMUNITY): Payer: Self-pay | Admitting: *Deleted

## 2021-03-14 DIAGNOSIS — C9001 Multiple myeloma in remission: Secondary | ICD-10-CM

## 2021-03-14 MED ORDER — LENALIDOMIDE 10 MG PO CAPS: ORAL_CAPSULE | ORAL | 0 refills | Status: DC

## 2021-03-17 ENCOUNTER — Other Ambulatory Visit (HOSPITAL_COMMUNITY): Payer: Self-pay | Admitting: Hematology

## 2021-03-17 DIAGNOSIS — C9001 Multiple myeloma in remission: Secondary | ICD-10-CM

## 2021-03-20 ENCOUNTER — Encounter (HOSPITAL_COMMUNITY): Payer: Self-pay | Admitting: Hematology

## 2021-03-21 ENCOUNTER — Other Ambulatory Visit (HOSPITAL_COMMUNITY): Payer: Self-pay

## 2021-03-21 ENCOUNTER — Encounter (HOSPITAL_COMMUNITY): Payer: Self-pay

## 2021-03-21 ENCOUNTER — Other Ambulatory Visit: Payer: Self-pay

## 2021-03-21 ENCOUNTER — Inpatient Hospital Stay (HOSPITAL_COMMUNITY): Payer: Medicare HMO | Attending: Hematology and Oncology

## 2021-03-21 DIAGNOSIS — Z809 Family history of malignant neoplasm, unspecified: Secondary | ICD-10-CM | POA: Diagnosis not present

## 2021-03-21 DIAGNOSIS — R519 Headache, unspecified: Secondary | ICD-10-CM | POA: Diagnosis not present

## 2021-03-21 DIAGNOSIS — M7989 Other specified soft tissue disorders: Secondary | ICD-10-CM | POA: Diagnosis not present

## 2021-03-21 DIAGNOSIS — M546 Pain in thoracic spine: Secondary | ICD-10-CM | POA: Insufficient documentation

## 2021-03-21 DIAGNOSIS — Z8249 Family history of ischemic heart disease and other diseases of the circulatory system: Secondary | ICD-10-CM | POA: Diagnosis not present

## 2021-03-21 DIAGNOSIS — Z8379 Family history of other diseases of the digestive system: Secondary | ICD-10-CM | POA: Diagnosis not present

## 2021-03-21 DIAGNOSIS — Z853 Personal history of malignant neoplasm of breast: Secondary | ICD-10-CM | POA: Diagnosis not present

## 2021-03-21 DIAGNOSIS — C9000 Multiple myeloma not having achieved remission: Secondary | ICD-10-CM | POA: Insufficient documentation

## 2021-03-21 DIAGNOSIS — Z833 Family history of diabetes mellitus: Secondary | ICD-10-CM | POA: Insufficient documentation

## 2021-03-21 DIAGNOSIS — R200 Anesthesia of skin: Secondary | ICD-10-CM | POA: Insufficient documentation

## 2021-03-21 DIAGNOSIS — Z800 Family history of malignant neoplasm of digestive organs: Secondary | ICD-10-CM | POA: Insufficient documentation

## 2021-03-21 DIAGNOSIS — Z9484 Stem cells transplant status: Secondary | ICD-10-CM | POA: Insufficient documentation

## 2021-03-21 DIAGNOSIS — G629 Polyneuropathy, unspecified: Secondary | ICD-10-CM | POA: Diagnosis not present

## 2021-03-21 DIAGNOSIS — Z79899 Other long term (current) drug therapy: Secondary | ICD-10-CM | POA: Insufficient documentation

## 2021-03-21 DIAGNOSIS — R5383 Other fatigue: Secondary | ICD-10-CM | POA: Insufficient documentation

## 2021-03-21 DIAGNOSIS — Z818 Family history of other mental and behavioral disorders: Secondary | ICD-10-CM | POA: Diagnosis not present

## 2021-03-21 DIAGNOSIS — C9001 Multiple myeloma in remission: Secondary | ICD-10-CM

## 2021-03-21 LAB — CBC WITH DIFFERENTIAL/PLATELET
Abs Immature Granulocytes: 0.01 10*3/uL (ref 0.00–0.07)
Basophils Absolute: 0 10*3/uL (ref 0.0–0.1)
Basophils Relative: 1 %
Eosinophils Absolute: 0.3 10*3/uL (ref 0.0–0.5)
Eosinophils Relative: 12 %
HCT: 36.6 % (ref 36.0–46.0)
Hemoglobin: 12 g/dL (ref 12.0–15.0)
Immature Granulocytes: 0 %
Lymphocytes Relative: 46 %
Lymphs Abs: 1.2 10*3/uL (ref 0.7–4.0)
MCH: 31.3 pg (ref 26.0–34.0)
MCHC: 32.8 g/dL (ref 30.0–36.0)
MCV: 95.6 fL (ref 80.0–100.0)
Monocytes Absolute: 0.3 10*3/uL (ref 0.1–1.0)
Monocytes Relative: 11 %
Neutro Abs: 0.8 10*3/uL — ABNORMAL LOW (ref 1.7–7.7)
Neutrophils Relative %: 30 %
Platelets: 172 10*3/uL (ref 150–400)
RBC: 3.83 MIL/uL — ABNORMAL LOW (ref 3.87–5.11)
RDW: 13.5 % (ref 11.5–15.5)
WBC: 2.7 10*3/uL — ABNORMAL LOW (ref 4.0–10.5)
nRBC: 0 % (ref 0.0–0.2)

## 2021-03-21 LAB — COMPREHENSIVE METABOLIC PANEL
ALT: 129 U/L — ABNORMAL HIGH (ref 0–44)
AST: 123 U/L — ABNORMAL HIGH (ref 15–41)
Albumin: 3.7 g/dL (ref 3.5–5.0)
Alkaline Phosphatase: 243 U/L — ABNORMAL HIGH (ref 38–126)
Anion gap: 8 (ref 5–15)
BUN: 17 mg/dL (ref 8–23)
CO2: 29 mmol/L (ref 22–32)
Calcium: 9.1 mg/dL (ref 8.9–10.3)
Chloride: 101 mmol/L (ref 98–111)
Creatinine, Ser: 0.83 mg/dL (ref 0.44–1.00)
GFR, Estimated: >60 mL/min (ref >60)
Glucose, Bld: 96 mg/dL (ref 70–99)
Potassium: 3.4 mmol/L — ABNORMAL LOW (ref 3.5–5.1)
Sodium: 138 mmol/L (ref 135–145)
Total Bilirubin: 0.6 mg/dL (ref 0.3–1.2)
Total Protein: 7 g/dL (ref 6.5–8.1)

## 2021-03-21 LAB — MAGNESIUM: Magnesium: 2 mg/dL (ref 1.7–2.4)

## 2021-03-21 LAB — LACTATE DEHYDROGENASE: LDH: 189 U/L (ref 98–192)

## 2021-03-21 LAB — VITAMIN D 25 HYDROXY (VIT D DEFICIENCY, FRACTURES): Vit D, 25-Hydroxy: 91.42 ng/mL (ref 30–100)

## 2021-03-21 MED ORDER — LENALIDOMIDE 10 MG PO CAPS: ORAL_CAPSULE | ORAL | 0 refills | Status: DC

## 2021-03-21 NOTE — Telephone Encounter (Signed)
Chart reviewed. Revlimid refilled per last office visit with Dr. Alvy Bimler.

## 2021-03-22 ENCOUNTER — Other Ambulatory Visit (HOSPITAL_COMMUNITY): Payer: Medicare HMO

## 2021-03-22 ENCOUNTER — Other Ambulatory Visit (HOSPITAL_COMMUNITY): Payer: Self-pay | Admitting: *Deleted

## 2021-03-22 DIAGNOSIS — C9000 Multiple myeloma not having achieved remission: Secondary | ICD-10-CM

## 2021-03-22 LAB — KAPPA/LAMBDA LIGHT CHAINS
Kappa free light chain: 28.9 mg/L — ABNORMAL HIGH (ref 3.3–19.4)
Kappa, lambda light chain ratio: 1.2 (ref 0.26–1.65)
Lambda free light chains: 24 mg/L (ref 5.7–26.3)

## 2021-03-23 LAB — IMMUNOFIXATION ELECTROPHORESIS
IgA: 118 mg/dL (ref 87–352)
IgG (Immunoglobin G), Serum: 1115 mg/dL (ref 586–1602)
IgM (Immunoglobulin M), Srm: 44 mg/dL (ref 26–217)
Total Protein ELP: 6.7 g/dL (ref 6.0–8.5)

## 2021-03-24 LAB — PROTEIN ELECTROPHORESIS, SERUM
A/G Ratio: 1.1 (ref 0.7–1.7)
Albumin ELP: 3.5 g/dL (ref 2.9–4.4)
Alpha-1-Globulin: 0.3 g/dL (ref 0.0–0.4)
Alpha-2-Globulin: 0.8 g/dL (ref 0.4–1.0)
Beta Globulin: 1.1 g/dL (ref 0.7–1.3)
Gamma Globulin: 1 g/dL (ref 0.4–1.8)
Globulin, Total: 3.2 g/dL (ref 2.2–3.9)
Total Protein ELP: 6.7 g/dL (ref 6.0–8.5)

## 2021-03-28 NOTE — Progress Notes (Signed)
[address] [place], [place] [address]   CLINIC:  Medical Oncology/Hematology  PCP:  [name], MD [place] / [place] [address] [phone]   REASON FOR VISIT:  Follow-up for multiple myeloma  PRIOR THERAPY:  1. RVD x 6 cycles from 08/19/2019 to 12/15/2019. 2. 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  NGS Results: not done  CURRENT THERAPY: Revlimid 10 mg daily; Zometa weekly  BRIEF ONCOLOGIC HISTORY:  Oncology History  Multiple myeloma ([place])  08/22/2019 Initial Diagnosis   Multiple myeloma ([place])    08/22/2019 Cancer Staging   Staging form: Plasma Cell Myeloma and Plasma Cell Disorders, AJCC 8th Edition - Clinical stage from 08/22/2019: RISS Stage II (Beta-2-microglobulin (mg/L): 3.8, Albumin (g/dL): 3.2, ISS: Stage II, High-risk cytogenetics: Absent, LDH: Normal) - Signed by Derek Jack, MD on 08/22/2019      CANCER STAGING: Cancer Staging Multiple myeloma ([place]) Staging form: Plasma Cell Myeloma and Plasma Cell Disorders, AJCC 8th Edition - Clinical stage from 08/22/2019: RISS Stage II (Beta-2-microglobulin (mg/L): 3.8, Albumin (g/dL): 3.2, ISS: Stage II, High-risk cytogenetics: Absent, LDH: Normal) - Signed by Derek Jack, MD on 08/22/2019  Virtual Visit via Video Note  I connected with Jaquasia G Hajduk on @TODAY @ at  1:00 PM EDT by a video enabled telemedicine application and verified that I am speaking with the correct person using two identifiers.  Location: Patient: clinic Provider: home  Others present: Renda Rolls, RN   I discussed the limitations of evaluation and management by telemedicine and the availability of in person appointments. The patient expressed understanding and agreed to proceed.  INTERVAL HISTORY:  [name], a 65 y.o. female, returns for routine follow-up of her multiple myeloma. Laprecious was last seen on 01/04/21.   Today she reports feeling good. She reports sore pain in  her mid to lower back which is improved when not taking Revlimid. She reports an episode of redness and swelling in her arm but denies any associated itching or pain. She denies any dental issues or jaw pain.   REVIEW OF SYSTEMS:  Review of Systems  Constitutional:  Positive for fatigue (75%). Negative for appetite change.  Musculoskeletal:  Positive for back pain (6/10 mid to low).  Skin:  Negative for itching.  Neurological:  Positive for headaches and numbness (numbness and tinling in hands and feet).   PAST MEDICAL/SURGICAL HISTORY:  Past Medical History:  Diagnosis Date   Breast cancer ([place]) 2006   left breast   Breast mass, right    Compression fracture of cervical spine ([place]) 06/18/2019   recent Chest xray stating had fx.   High cholesterol    History of bladder infections    Hypertension    Rash Aug 2013   Past Surgical History:  Procedure Laterality Date   ABDOMINAL HYSTERECTOMY     just uterus removed   BREAST BIOPSY     BREAST LUMPECTOMY WITH RADIOACTIVE SEED LOCALIZATION Right 07/24/2018   Procedure: RIGHT BREAST LUMPECTOMY WITH RADIOACTIVE SEED LOCALIZATION;  Surgeon: Coralie Keens, MD;  Location: [facility];  Service: General;  Laterality: Right;   IR RADIOLOGIST EVAL & MGMT  07/28/2020   MASTECTOMY  5/06   left   PARTIAL HYSTERECTOMY     PORT-A-CATH REMOVAL     PORTACATH PLACEMENT      SOCIAL HISTORY:  Social History   Socioeconomic History   Marital status: Married    Spouse name: Not on file   Number of children: 2  Years of education: Not on file   Highest education level: Not on file  Occupational History    Employer: [employer]  Tobacco Use   Smoking status: Never   Smokeless tobacco: Never  Vaping Use   Vaping Use: Never used  Substance and Sexual Activity   Alcohol use: No   Drug use: No   Sexual activity: Yes    Birth control/protection: Surgical  Other Topics Concern   Not on file  Social History Narrative    Not on file   Social Determinants of Health   Financial Resource Strain: Low Risk    Difficulty of Paying Living Expenses: Not hard at all  Food Insecurity: No Food Insecurity   Worried About Charity fundraiser in the Last Year: Never true   [place] in the Last Year: Never true  Transportation Needs: No Transportation Needs   Lack of Transportation (Medical): No   Lack of Transportation (Non-Medical): No  Physical Activity: Inactive   Days of Exercise per Week: 0 days   Minutes of Exercise per Session: 0 min  Stress: No Stress Concern Present   Feeling of Stress : Not at all  Social Connections: Moderately Integrated   Frequency of Communication with Friends and Family: More than three times a week   Frequency of Social Gatherings with Friends and Family: Never   Attends Religious Services: More than 4 times per year   Active Member of [employer] or Organizations: No   Attends Music therapist: Never   Marital Status: Married  Human resources officer Violence: Not At Risk   Fear of Current or Ex-Partner: No   Emotionally Abused: No   Physically Abused: No   Sexually Abused: No    FAMILY HISTORY:  Family History  Problem Relation Age of Onset   Dementia Mother    Stomach cancer Father    Cancer Brother    Cancer Brother    Diabetes Brother    Colitis Daughter    Hypertension Daughter    Hypertension Daughter     CURRENT MEDICATIONS:  Current Outpatient Medications  Medication Sig Dispense Refill   aspirin EC 81 MG tablet Take 81 mg by mouth daily. Swallow whole.     calcium carbonate (TUMS - DOSED IN MG ELEMENTAL CALCIUM) 500 MG chewable tablet Chew 2 tablets by mouth 2 (two) times daily.     furosemide (LASIX) 20 MG tablet Take 1 tablet (20 mg total) by mouth daily as needed for fluid. 30 tablet 6   HYDROcodone-acetaminophen (NORCO) 10-325 MG tablet Take 1 tablet by mouth 2 (two) times daily as needed for moderate pain. 45 tablet 0   lenalidomide  (REVLIMID) 10 MG capsule TAKE 1 CAPSULE BY MOUTH DAILY FOR 3 WEEKS ON, 1 WEEK OFF FOR 28 DAY SUPPLY 21 capsule 0   magnesium oxide (MAG-OX) 400 (241.3 Mg) MG tablet Take 1 tablet (400 mg total) by mouth daily. 60 tablet 3   potassium chloride (KLOR-CON) 10 MEQ tablet Take 1 tablet (10 mEq total) by mouth 2 (two) times daily. 180 tablet 3   prochlorperazine (COMPAZINE) 10 MG tablet Take 1 tablet (10 mg total) by mouth every 6 (six) hours as needed for nausea or vomiting. 45 tablet 2   Vitamin D, Ergocalciferol, (DRISDOL) 1.25 MG (50000 UNIT) CAPS capsule TAKE 1 CAPSULE BY MOUTH ONE TIME PER WEEK 12 capsule 1   No current facility-administered medications for this visit.    ALLERGIES:  No Known Allergies Performance  status (ECOG): 1 - Symptomatic but completely ambulatory  There were no vitals filed for this visit. Wt Readings from Last 3 Encounters:  03/01/21 152 lb 12.8 oz (69.3 kg)  02/01/21 157 lb 12.8 oz (71.6 kg)  01/04/21 152 lb 1.6 oz (69 kg)   LABORATORY DATA:  I have reviewed the labs as listed.  CBC Latest Ref Rng & Units 03/21/2021 02/23/2021 02/01/2021  WBC 4.0 - 10.5 K/uL 2.7(L) 2.9(L) 1.9(L)  Hemoglobin 12.0 - 15.0 g/dL 12.0 11.4(L) 11.0(L)  Hematocrit 36.0 - 46.0 % 36.6 34.8(L) 34.5(L)  Platelets 150 - 400 K/uL 172 189 143(L)   CMP Latest Ref Rng & Units 03/21/2021 02/23/2021 02/01/2021  Glucose 70 - 99 mg/dL 96 97 79  BUN 8 - 23 mg/dL 17 18 16   Creatinine 0.44 - 1.00 mg/dL 0.83 0.79 0.71  Sodium 135 - 145 mmol/L 138 139 140  Potassium 3.5 - 5.1 mmol/L 3.4(L) 3.5 4.0  Chloride 98 - 111 mmol/L 101 106 106  CO2 22 - 32 mmol/L 29 28 28   Calcium 8.9 - 10.3 mg/dL 9.1 8.8(L) 9.2  Total Protein 6.5 - 8.1 g/dL 7.0 6.8 6.6  Total Bilirubin 0.3 - 1.2 mg/dL 0.6 0.5 0.7  Alkaline Phos 38 - 126 U/L 243(H) 139(H) 121  AST 15 - 41 U/L 123(H) 29 26  ALT 0 - 44 U/L 129(H) 41 31    DIAGNOSTIC IMAGING:  I have independently reviewed the scans and discussed with the patient. No  results found.   ASSESSMENT:  1. 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  Revlimid started on 10/06/2019 due to delay. -Velcade held since 12/15/2019 due to gastric symptoms, which have resolved completely. -She was evaluated by Dr. Samule Ohm at [facility].  Recommended to have immediate bone marrow transplant. -She has compression fractures of T8, T10 and T12.  Also L1 and L3 compression fractures. -Revlimid and dexamethasone continued until 03/22/2020. 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 -Revlimid 10 mg daily started around first week of December 2021. - Revlimid dose decreased to 10 mg 3 weeks on 1 week off on 01/04/2021 due to worsening back pain.   2.  Upper back pain: -MRI on 07/13/2020 shows T7, T8, T12, L1 and L2 compression/superior endplate fractures.  Height loss is greatest and moderate at T7 and T8.  No evidence of bone lesions.   PLAN:  1.  IgA lambda plasma cell myeloma: - I have reviewed myeloma panel from 03/21/2021.  Immunofixation and SPEP are negative.  Free light chain ratio was normal at 1.2.  Kappa light chains have slightly increased to 28 from 22.  Her LFTs have normalized.  Alkaline phosphatase is mildly elevated at 148 from bone lesions. - Continue Revlimid 10 mg 3 weeks on/1 week off. - RTC 2 months with repeat labs 1 week prior.   2.  Mid back pain: - She was previously evaluated by IR for vertebroplasty which was not recommended. - She reported improvement in the back pain during off week of Revlimid.  She reports the pain started in the morning and gradually gets worse during the daytime.  Overall she reports that the pain has been stable since last November. - I have told her to call us if the pain gets worse.  We will repeat MRI of the thoracic spine with and without contrast.  She was told to start taking gabapentin to see if it helps.  3.  Bone  protection: - Zometa was started back on 08/10/2020. - Calcium is normal.  She does not report any side effects.  Continue monthly Zometa.   4.  ID prophylaxis: - She will continue acyclovir and aspirin.   5.  Neuropathy: - She is not taking gabapentin at nighttime. - I have told her to start taking gabapentin around midday when her mid back pain is worse.   6.  Hypomagnesemia: - Continue magnesium every other day.  Magnesium today is 2.0.   7.  Hypertension: - Continue HCTZ 25 mg daily.  Blood pressure today is 160/73.  I have told her to check her blood pressure daily for a week and call us back.  If it remains elevated, will consider adding amlodipine.   8.  Posttransplant vaccination: - She received second dose of Shingrix at [facility] on 03/16/2021.  She will come back in August for Hib vaccine and DTaP.   Orders placed this encounter:  No orders of the defined types were placed in this encounter.  I provided 30 minutes of non-face-to-face time during this encounter.  Derek Jack, MD [id number] [phone]   I, Thana Ates, am acting as a scribe for Dr. Derek Jack.  I, Derek Jack MD, have reviewed the above documentation for accuracy and completeness, and I agree with the above.

## 2021-03-29 ENCOUNTER — Inpatient Hospital Stay (HOSPITAL_COMMUNITY): Payer: Medicare HMO

## 2021-03-29 ENCOUNTER — Other Ambulatory Visit: Payer: Self-pay

## 2021-03-29 ENCOUNTER — Inpatient Hospital Stay (HOSPITAL_COMMUNITY): Payer: Medicare HMO | Admitting: Hematology

## 2021-03-29 ENCOUNTER — Other Ambulatory Visit (HOSPITAL_COMMUNITY): Payer: Self-pay | Admitting: *Deleted

## 2021-03-29 VITALS — BP 161/73 | HR 67 | Temp 98.3°F | Resp 16 | Wt 152.2 lb

## 2021-03-29 DIAGNOSIS — C9000 Multiple myeloma not having achieved remission: Secondary | ICD-10-CM

## 2021-03-29 DIAGNOSIS — C9001 Multiple myeloma in remission: Secondary | ICD-10-CM

## 2021-03-29 LAB — COMPREHENSIVE METABOLIC PANEL
ALT: 32 U/L (ref 0–44)
AST: 23 U/L (ref 15–41)
Albumin: 3.8 g/dL (ref 3.5–5.0)
Alkaline Phosphatase: 148 U/L — ABNORMAL HIGH (ref 38–126)
Anion gap: 7 (ref 5–15)
BUN: 23 mg/dL (ref 8–23)
CO2: 29 mmol/L (ref 22–32)
Calcium: 8.9 mg/dL (ref 8.9–10.3)
Chloride: 99 mmol/L (ref 98–111)
Creatinine, Ser: 0.85 mg/dL (ref 0.44–1.00)
GFR, Estimated: >60 mL/min (ref >60)
Glucose, Bld: 93 mg/dL (ref 70–99)
Potassium: 3.9 mmol/L (ref 3.5–5.1)
Sodium: 135 mmol/L (ref 135–145)
Total Bilirubin: 0.4 mg/dL (ref 0.3–1.2)
Total Protein: 7.1 g/dL (ref 6.5–8.1)

## 2021-03-29 LAB — CBC WITH DIFFERENTIAL/PLATELET
Abs Immature Granulocytes: 0 10*3/uL (ref 0.00–0.07)
Basophils Absolute: 0 10*3/uL (ref 0.0–0.1)
Basophils Relative: 1 %
Blasts: 2 %
Eosinophils Absolute: 0.4 10*3/uL (ref 0.0–0.5)
Eosinophils Relative: 12 %
HCT: 35.3 % — ABNORMAL LOW (ref 36.0–46.0)
Hemoglobin: 11.5 g/dL — ABNORMAL LOW (ref 12.0–15.0)
Lymphocytes Relative: 26 %
Lymphs Abs: 0.9 10*3/uL (ref 0.7–4.0)
MCH: 31.4 pg (ref 26.0–34.0)
MCHC: 32.6 g/dL (ref 30.0–36.0)
MCV: 96.4 fL (ref 80.0–100.0)
Metamyelocytes Relative: 8 %
Monocytes Absolute: 0.1 10*3/uL (ref 0.1–1.0)
Monocytes Relative: 2 %
Neutro Abs: 1.6 10*3/uL — ABNORMAL LOW (ref 1.7–7.7)
Neutrophils Relative %: 49 %
Platelets: 202 10*3/uL (ref 150–400)
RBC: 3.66 MIL/uL — ABNORMAL LOW (ref 3.87–5.11)
RDW: 13.8 % (ref 11.5–15.5)
WBC: 3.3 10*3/uL — ABNORMAL LOW (ref 4.0–10.5)
nRBC: 0 % (ref 0.0–0.2)

## 2021-03-29 LAB — MAGNESIUM: Magnesium: 2 mg/dL (ref 1.7–2.4)

## 2021-03-29 MED ORDER — SODIUM CHLORIDE 0.9 % IV SOLN: Freq: Once | INTRAVENOUS | Status: AC

## 2021-03-29 MED ORDER — GABAPENTIN 300 MG PO CAPS: 300.0000 mg | ORAL_CAPSULE | Freq: Every day | ORAL | 3 refills | Status: DC

## 2021-03-29 MED ORDER — ZOLEDRONIC ACID 4 MG/100ML IV SOLN
4.0000 mg | Freq: Once | INTRAVENOUS | Status: AC
  Administered 2021-03-29: 4 mg via INTRAVENOUS

## 2021-03-29 NOTE — Patient Instructions (Addendum)
[facility] at [facility] Discharge Instructions  You were seen today by Dr. Katragadda. He went over your recent results. Dr. Katragadda will see you back in 2 months for labs and follow up.   Thank you for choosing [facility] at [facility] to provide your oncology and hematology care.  To afford each patient quality time with our provider, please arrive at least 15 minutes before your scheduled appointment time.   If you have a lab appointment with the Cancer Center please come in thru the Main Entrance and check in at the main information desk  You need to re-schedule your appointment should you arrive 10 or more minutes late.  We strive to give you quality time with our providers, and arriving late affects you and other patients whose appointments are after yours.  Also, if you no show three or more times for appointments you may be dismissed from the clinic at the providers discretion.     Again, thank you for choosing [facility].  Our hope is that these requests will decrease the amount of time that you wait before being seen by our physicians.       _____________________________________________________________  Should you have questions after your visit to  Cancer Center, please contact our office at [phone] between the hours of 8:00 a.m. and 4:30 p.m.  Voicemails left after 4:00 p.m. will not be returned until the following business day.  For prescription refill requests, have your pharmacy contact our office and allow 72 hours.    Cancer Center Support Programs:   > Cancer Support Group  2nd Tuesday of the month 1pm-2pm, Journey Room   

## 2021-03-29 NOTE — Patient Instructions (Signed)
[facility]  Discharge Instructions: Thank you for choosing [facility] to provide your oncology and hematology care.  If you have a lab appointment with the Cancer Center, please come in thru the Main Entrance and check in at the main information desk.  Wear comfortable clothing and clothing appropriate for easy access to any Portacath or PICC line.   We strive to give you quality time with your provider. You may need to reschedule your appointment if you arrive late (15 or more minutes).  Arriving late affects you and other patients whose appointments are after yours.  Also, if you miss three or more appointments without notifying the office, you may be dismissed from the clinic at the provider's discretion.      For prescription refill requests, have your pharmacy contact our office and allow 72 hours for refills to be completed.        To help prevent nausea and vomiting after your treatment, we encourage you to take your nausea medication as directed.  BELOW ARE SYMPTOMS THAT SHOULD BE REPORTED IMMEDIATELY: *FEVER GREATER THAN 100.4 F (38 C) OR HIGHER *CHILLS OR SWEATING *NAUSEA AND VOMITING THAT IS NOT CONTROLLED WITH YOUR NAUSEA MEDICATION *UNUSUAL SHORTNESS OF BREATH *UNUSUAL BRUISING OR BLEEDING *URINARY PROBLEMS (pain or burning when urinating, or frequent urination) *BOWEL PROBLEMS (unusual diarrhea, constipation, pain near the anus) TENDERNESS IN MOUTH AND THROAT WITH OR WITHOUT PRESENCE OF ULCERS (sore throat, sores in mouth, or a toothache) UNUSUAL RASH, SWELLING OR PAIN  UNUSUAL VAGINAL DISCHARGE OR ITCHING   Items with * indicate a potential emergency and should be followed up as soon as possible or go to the Emergency Department if any problems should occur.  Please show the CHEMOTHERAPY ALERT CARD or IMMUNOTHERAPY ALERT CARD at check-in to the Emergency Department and triage nurse.  Should you have questions after your visit or need to cancel  or reschedule your appointment, please contact [facility] [phone]  and follow the prompts.  Office hours are 8:00 a.m. to 4:30 p.m. Monday - Friday. Please note that voicemails left after 4:00 p.m. may not be returned until the following business day.  We are closed weekends and major holidays. You have access to a nurse at all times for urgent questions. Please call the main number to the clinic [phone] and follow the prompts.  For any non-urgent questions, you may also contact your provider using MyChart. We now offer e-Visits for anyone 18 and older to request care online for non-urgent symptoms. For details visit [email or web address].   Also download the MyChart app! Go to the app store, search "MyChart", open the app, select [facility], and log in with your MyChart username and password.  Due to Covid, a mask is required upon entering the hospital/clinic. If you do not have a mask, one will be given to you upon arrival. For doctor visits, patients may have 1 support person aged 18 or older with them. For treatment visits, patients cannot have anyone with them due to current Covid guidelines and our immunocompromised population.  

## 2021-03-29 NOTE — Progress Notes (Signed)
Patient has been assessed, vital signs and labs have been reviewed by Dr. Katragadda. ANC, Creatinine, LFTs, and Platelets are within treatment parameters per Dr. Katragadda. The patient is good to proceed with treatment at this time. Primary RN and pharmacy aware.  

## 2021-03-29 NOTE — Progress Notes (Signed)
Patient here today for Zometa infusion.  Labs were reviewed by Dr. Delton Coombes and he wanted to proceed with treatment.  Patient tolerated the infusion well.  Patient left ambulatory in stable condition with no complaints voiced.

## 2021-04-09 ENCOUNTER — Other Ambulatory Visit (HOSPITAL_COMMUNITY): Payer: Self-pay | Admitting: Hematology

## 2021-04-09 DIAGNOSIS — C9001 Multiple myeloma in remission: Secondary | ICD-10-CM

## 2021-04-12 ENCOUNTER — Encounter (HOSPITAL_COMMUNITY): Payer: Self-pay

## 2021-04-13 ENCOUNTER — Other Ambulatory Visit (HOSPITAL_COMMUNITY): Payer: Self-pay | Admitting: *Deleted

## 2021-04-13 ENCOUNTER — Encounter (HOSPITAL_COMMUNITY): Payer: Self-pay | Admitting: Hematology

## 2021-04-13 DIAGNOSIS — C9001 Multiple myeloma in remission: Secondary | ICD-10-CM

## 2021-04-13 MED ORDER — LENALIDOMIDE 10 MG PO CAPS: ORAL_CAPSULE | ORAL | 0 refills | Status: DC

## 2021-04-13 MED ORDER — VITAMIN D (ERGOCALCIFEROL) 1.25 MG (50000 UNIT) PO CAPS: ORAL_CAPSULE | ORAL | 1 refills | Status: DC

## 2021-04-13 NOTE — Telephone Encounter (Signed)
Chart reviewed. Revlimid refilled per last office note with Dr. Katragadda.  

## 2021-04-26 ENCOUNTER — Inpatient Hospital Stay (HOSPITAL_COMMUNITY): Payer: Medicare HMO

## 2021-04-26 ENCOUNTER — Inpatient Hospital Stay (HOSPITAL_COMMUNITY): Payer: Medicare HMO | Attending: Hematology

## 2021-04-26 ENCOUNTER — Encounter (HOSPITAL_COMMUNITY): Payer: Self-pay

## 2021-04-26 ENCOUNTER — Other Ambulatory Visit: Payer: Self-pay

## 2021-04-26 VITALS — BP 129/59 | HR 69 | Temp 96.9°F | Resp 17

## 2021-04-26 DIAGNOSIS — Z8249 Family history of ischemic heart disease and other diseases of the circulatory system: Secondary | ICD-10-CM | POA: Diagnosis not present

## 2021-04-26 DIAGNOSIS — Z853 Personal history of malignant neoplasm of breast: Secondary | ICD-10-CM | POA: Diagnosis not present

## 2021-04-26 DIAGNOSIS — Z818 Family history of other mental and behavioral disorders: Secondary | ICD-10-CM | POA: Insufficient documentation

## 2021-04-26 DIAGNOSIS — C9000 Multiple myeloma not having achieved remission: Secondary | ICD-10-CM

## 2021-04-26 DIAGNOSIS — R7401 Elevation of levels of liver transaminase levels: Secondary | ICD-10-CM | POA: Diagnosis not present

## 2021-04-26 DIAGNOSIS — Z833 Family history of diabetes mellitus: Secondary | ICD-10-CM | POA: Diagnosis not present

## 2021-04-26 DIAGNOSIS — D61818 Other pancytopenia: Secondary | ICD-10-CM | POA: Diagnosis not present

## 2021-04-26 DIAGNOSIS — Z23 Encounter for immunization: Secondary | ICD-10-CM | POA: Diagnosis not present

## 2021-04-26 DIAGNOSIS — Z809 Family history of malignant neoplasm, unspecified: Secondary | ICD-10-CM | POA: Insufficient documentation

## 2021-04-26 DIAGNOSIS — Z800 Family history of malignant neoplasm of digestive organs: Secondary | ICD-10-CM | POA: Diagnosis not present

## 2021-04-26 DIAGNOSIS — Z8379 Family history of other diseases of the digestive system: Secondary | ICD-10-CM | POA: Insufficient documentation

## 2021-04-26 LAB — COMPREHENSIVE METABOLIC PANEL
ALT: 27 U/L (ref 0–44)
AST: 26 U/L (ref 15–41)
Albumin: 3.8 g/dL (ref 3.5–5.0)
Alkaline Phosphatase: 111 U/L (ref 38–126)
Anion gap: 7 (ref 5–15)
BUN: 18 mg/dL (ref 8–23)
CO2: 29 mmol/L (ref 22–32)
Calcium: 8.7 mg/dL — ABNORMAL LOW (ref 8.9–10.3)
Chloride: 99 mmol/L (ref 98–111)
Creatinine, Ser: 0.82 mg/dL (ref 0.44–1.00)
GFR, Estimated: >60 mL/min (ref >60)
Glucose, Bld: 82 mg/dL (ref 70–99)
Potassium: 3.4 mmol/L — ABNORMAL LOW (ref 3.5–5.1)
Sodium: 135 mmol/L (ref 135–145)
Total Bilirubin: 0.7 mg/dL (ref 0.3–1.2)
Total Protein: 7 g/dL (ref 6.5–8.1)

## 2021-04-26 LAB — CBC WITH DIFFERENTIAL/PLATELET
Abs Immature Granulocytes: 0 10*3/uL (ref 0.00–0.07)
Basophils Absolute: 0 10*3/uL (ref 0.0–0.1)
Basophils Relative: 1 %
Eosinophils Absolute: 0.1 10*3/uL (ref 0.0–0.5)
Eosinophils Relative: 3 %
HCT: 35.7 % — ABNORMAL LOW (ref 36.0–46.0)
Hemoglobin: 11.6 g/dL — ABNORMAL LOW (ref 12.0–15.0)
Immature Granulocytes: 0 %
Lymphocytes Relative: 40 %
Lymphs Abs: 1 10*3/uL (ref 0.7–4.0)
MCH: 31.3 pg (ref 26.0–34.0)
MCHC: 32.5 g/dL (ref 30.0–36.0)
MCV: 96.2 fL (ref 80.0–100.0)
Monocytes Absolute: 0.2 10*3/uL (ref 0.1–1.0)
Monocytes Relative: 8 %
Neutro Abs: 1.2 10*3/uL — ABNORMAL LOW (ref 1.7–7.7)
Neutrophils Relative %: 48 %
Platelets: 163 10*3/uL (ref 150–400)
RBC: 3.71 MIL/uL — ABNORMAL LOW (ref 3.87–5.11)
RDW: 14.1 % (ref 11.5–15.5)
WBC: 2.5 10*3/uL — ABNORMAL LOW (ref 4.0–10.5)
nRBC: 0 % (ref 0.0–0.2)

## 2021-04-26 LAB — MAGNESIUM: Magnesium: 1.8 mg/dL (ref 1.7–2.4)

## 2021-04-26 MED ORDER — DTAP-IPV-HIB VACCINE IM SUSR
0.5000 mL | Freq: Once | INTRAMUSCULAR | Status: AC
  Administered 2021-04-26: 0.5 mL via INTRAMUSCULAR
  Filled 2021-04-26: qty 1

## 2021-04-26 MED ORDER — HEPATITIS B VAC RECOMBINANT 20 MCG/ML IJ SUSP
2.0000 mL | Freq: Once | INTRAMUSCULAR | Status: AC
  Administered 2021-04-26: 40 ug via INTRAMUSCULAR
  Filled 2021-04-26: qty 2

## 2021-04-26 MED ORDER — ZOLEDRONIC ACID 4 MG/100ML IV SOLN
4.0000 mg | Freq: Once | INTRAVENOUS | Status: AC
  Administered 2021-04-26: 4 mg via INTRAVENOUS
  Filled 2021-04-26: qty 100

## 2021-04-26 MED ORDER — PNEUMOCOCCAL 13-VAL CONJ VACC IM SUSP
0.5000 mL | Freq: Once | INTRAMUSCULAR | Status: AC
  Administered 2021-04-26: 0.5 mL via INTRAMUSCULAR
  Filled 2021-04-26: qty 0.5

## 2021-04-26 MED ORDER — HAEMOPHILUS B POLYSAC CONJ VAC IM SOLR
0.5000 mL | Freq: Once | INTRAMUSCULAR | Status: DC
  Filled 2021-04-26: qty 0.5

## 2021-04-26 MED ORDER — SODIUM CHLORIDE 0.9 % IV SOLN: Freq: Once | INTRAVENOUS | Status: AC

## 2021-04-26 NOTE — Progress Notes (Signed)
Patient presents today for Zometa infusion and vaccines. Calcium 8.7 today.  Patient taking Calcium Carbonate 500 mg chewable tablet twice daily. Patient taking Revlimid as prescribed.   Zometa given today per MD orders. Tolerated infusion without adverse affects. Vital signs stable. No complaints at this time. Discharged from clinic ambulatory in stable condition. Alert and oriented x 3. F/U with [facility] as scheduled.

## 2021-04-26 NOTE — Patient Instructions (Signed)
[facility]  Discharge Instructions: Thank you for choosing [facility] to provide your oncology and hematology care.  If you have a lab appointment with the [facility], please come in thru the Main Entrance and check in at the main information desk.  Wear comfortable clothing and clothing appropriate for easy access to any Portacath or PICC line.   We strive to give you quality time with your provider. You may need to reschedule your appointment if you arrive late (15 or more minutes).  Arriving late affects you and other patients whose appointments are after yours.  Also, if you miss three or more appointments without notifying the office, you may be dismissed from the clinic at the provider's discretion.      For prescription refill requests, have your pharmacy contact our office and allow 72 hours for refills to be completed.    Today you received the following Zometa .       To help prevent nausea and vomiting after your treatment, we encourage you to take your nausea medication as directed.  BELOW ARE SYMPTOMS THAT SHOULD BE REPORTED IMMEDIATELY: *FEVER GREATER THAN 100.4 F (38 C) OR HIGHER *CHILLS OR SWEATING *NAUSEA AND VOMITING THAT IS NOT CONTROLLED WITH YOUR NAUSEA MEDICATION *UNUSUAL SHORTNESS OF BREATH *UNUSUAL BRUISING OR BLEEDING *URINARY PROBLEMS (pain or burning when urinating, or frequent urination) *BOWEL PROBLEMS (unusual diarrhea, constipation, pain near the anus) TENDERNESS IN MOUTH AND THROAT WITH OR WITHOUT PRESENCE OF ULCERS (sore throat, sores in mouth, or a toothache) UNUSUAL RASH, SWELLING OR PAIN  UNUSUAL VAGINAL DISCHARGE OR ITCHING   Items with * indicate a potential emergency and should be followed up as soon as possible or go to the Emergency Department if any problems should occur.  Please show the CHEMOTHERAPY ALERT CARD or IMMUNOTHERAPY ALERT CARD at check-in to the Emergency Department and triage nurse.  Should you have  questions after your visit or need to cancel or reschedule your appointment, please contact [facility] [phone]  and follow the prompts.  Office hours are 8:00 a.m. to 4:30 p.m. Monday - Friday. Please note that voicemails left after 4:00 p.m. may not be returned until the following business day.  We are closed weekends and major holidays. You have access to a nurse at all times for urgent questions. Please call the main number to the clinic [phone] and follow the prompts.  For any non-urgent questions, you may also contact your provider using MyChart. We now offer e-Visits for anyone 38 and older to request care online for non-urgent symptoms. For details visit [email or web address].   Also download the MyChart app! Go to the app store, search "MyChart", open the app, select [facility], and log in with your MyChart username and password.  Due to Covid, a mask is required upon entering the hospital/clinic. If you do not have a mask, one will be given to you upon arrival. For doctor visits, patients may have 1 support person aged 35 or older with them. For treatment visits, patients cannot have anyone with them due to current Covid guidelines and our immunocompromised population.

## 2021-05-04 ENCOUNTER — Other Ambulatory Visit (HOSPITAL_COMMUNITY): Payer: Self-pay | Admitting: Hematology

## 2021-05-04 DIAGNOSIS — C9001 Multiple myeloma in remission: Secondary | ICD-10-CM

## 2021-05-08 ENCOUNTER — Encounter (HOSPITAL_COMMUNITY): Payer: Self-pay | Admitting: Hematology

## 2021-05-08 NOTE — Telephone Encounter (Signed)
Chart reviewed. Revlimid refilled per last office note with Dr. Delton Coombes.  Per previous conversation with patient, she has leftover Revlimid from previous change in cycle. Patient requests that the pharmacy fill only what she needs to complete a 21 day cycle as prescribed by Dr. Delton Coombes. Note to pharmacy attached: Please adjust the amount of dispensed medication based on the number to capsules the patient has left over. Call placed to pharmacy as well. Spoke with Meg, Pharmacy Technician to clarify that patient should only be dispensed what is needed to complete 21 days of medications. Meg to call the patient and seek clarity. Will call back with further clarification questions.

## 2021-05-10 ENCOUNTER — Other Ambulatory Visit (HOSPITAL_COMMUNITY): Payer: Self-pay

## 2021-05-10 DIAGNOSIS — C9001 Multiple myeloma in remission: Secondary | ICD-10-CM

## 2021-05-10 MED ORDER — LENALIDOMIDE 10 MG PO CAPS: ORAL_CAPSULE | ORAL | 0 refills | Status: DC

## 2021-05-23 NOTE — Progress Notes (Signed)
[place], [place] [address]   CLINIC:  Medical Oncology/Hematology  PCP:  [name], MD [place] / [place] [address] [phone]   REASON FOR VISIT:  Follow-up for multiple myeloma  PRIOR THERAPY:  1. RVD x 6 cycles from 08/19/2019 to 12/15/2019. 2. 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  NGS Results: not done  CURRENT THERAPY: Revlimid 10 mg daily; Zometa weekly  BRIEF ONCOLOGIC HISTORY:  Oncology History  Multiple myeloma ([facility])  08/22/2019 Initial Diagnosis   Multiple myeloma ([facility])   08/22/2019 Cancer Staging   Staging form: Plasma Cell Myeloma and Plasma Cell Disorders, AJCC 8th Edition - Clinical stage from 08/22/2019: RISS Stage II (Beta-2-microglobulin (mg/L): 3.8, Albumin (g/dL): 3.2, ISS: Stage II, High-risk cytogenetics: Absent, LDH: Normal) - Signed by Derek Jack, MD on 08/22/2019     CANCER STAGING: Cancer Staging Multiple myeloma ([place]) Staging form: Plasma Cell Myeloma and Plasma Cell Disorders, AJCC 8th Edition - Clinical stage from 08/22/2019: RISS Stage II (Beta-2-microglobulin (mg/L): 3.8, Albumin (g/dL): 3.2, ISS: Stage II, High-risk cytogenetics: Absent, LDH: Normal) - Signed by Derek Jack, MD on 08/22/2019   INTERVAL HISTORY:  [name], a 65 y.o. female, returns for routine follow-up of her multiple myeloma. Stephanie Galvan was last seen on 03/29/2021.   Today she reports feeling good. She reports improved but still present pain in her mid back. She denies any diarrhea, constipation, jaw pains, recent infections, fevers, and night sweats. She reports stable tingling/numbness in her feet. She takes Gabapentin prn.   REVIEW OF SYSTEMS:  Review of Systems  Constitutional:  Negative for appetite change, fatigue (80%) and fever.  Gastrointestinal:  Negative for constipation and diarrhea.  Musculoskeletal:  Positive for back pain (4/10 mid).  Neurological:  Positive for numbness  (feet).  All other systems reviewed and are negative.  PAST MEDICAL/SURGICAL HISTORY:  Past Medical History:  Diagnosis Date   Breast cancer ([facility]) 2006   left breast   Breast mass, right    Compression fracture of cervical spine ([facility]) 06/18/2019   recent Chest xray stating had fx.   High cholesterol    History of bladder infections    Hypertension    Rash Aug 2013   Past Surgical History:  Procedure Laterality Date   ABDOMINAL HYSTERECTOMY     just uterus removed   BREAST BIOPSY     BREAST LUMPECTOMY WITH RADIOACTIVE SEED LOCALIZATION Right 07/24/2018   Procedure: RIGHT BREAST LUMPECTOMY WITH RADIOACTIVE SEED LOCALIZATION;  Surgeon: Coralie Keens, MD;  Location: [facility];  Service: General;  Laterality: Right;   IR RADIOLOGIST EVAL & MGMT  07/28/2020   MASTECTOMY  5/06   left   PARTIAL HYSTERECTOMY     PORT-A-CATH REMOVAL     PORTACATH PLACEMENT      SOCIAL HISTORY:  Social History   Socioeconomic History   Marital status: Married    Spouse name: Not on file   Number of children: 2   Years of education: Not on file   Highest education level: Not on file  Occupational History    Employer: [employer]  Tobacco Use   Smoking status: Never   Smokeless tobacco: Never  Vaping Use   Vaping Use: Never used  Substance and Sexual Activity   Alcohol use: No   Drug use: No   Sexual activity: Yes    Birth control/protection: Surgical  Other Topics Concern   Not on file  Social History Narrative  Not on file   Social Determinants of Health   Financial Resource Strain: Low Risk    Difficulty of Paying Living Expenses: Not hard at all  Food Insecurity: No Food Insecurity   Worried About Charity fundraiser in the Last Year: Never true   [place] in the Last Year: Never true  Transportation Needs: No Transportation Needs   Lack of Transportation (Medical): No   Lack of Transportation (Non-Medical): No  Physical Activity: Inactive    Days of Exercise per Week: 0 days   Minutes of Exercise per Session: 0 min  Stress: No Stress Concern Present   Feeling of Stress : Not at all  Social Connections: Moderately Integrated   Frequency of Communication with Friends and Family: More than three times a week   Frequency of Social Gatherings with Friends and Family: Never   Attends Religious Services: More than 4 times per year   Active Member of [employer] or Organizations: No   Attends Music therapist: Never   Marital Status: Married  Human resources officer Violence: Not At Risk   Fear of Current or Ex-Partner: No   Emotionally Abused: No   Physically Abused: No   Sexually Abused: No    FAMILY HISTORY:  Family History  Problem Relation Age of Onset   Dementia Mother    Stomach cancer Father    Cancer Brother    Cancer Brother    Diabetes Brother    Colitis Daughter    Hypertension Daughter    Hypertension Daughter     CURRENT MEDICATIONS:  Current Outpatient Medications  Medication Sig Dispense Refill   aspirin EC 81 MG tablet Take 81 mg by mouth daily. Swallow whole.     calcium carbonate (TUMS - DOSED IN MG ELEMENTAL CALCIUM) 500 MG chewable tablet Chew 2 tablets by mouth 2 (two) times daily.     furosemide (LASIX) 20 MG tablet Take 1 tablet (20 mg total) by mouth daily as needed for fluid. 30 tablet 6   gabapentin (NEURONTIN) 300 MG capsule Take 1 capsule (300 mg total) by mouth daily. 30 capsule 3   hydrochlorothiazide (MICROZIDE) 12.5 MG capsule Take by mouth.     HYDROcodone-acetaminophen (NORCO) 10-325 MG tablet Take 1 tablet by mouth 2 (two) times daily as needed for moderate pain. 45 tablet 0   lenalidomide (REVLIMID) 10 MG capsule TAKE 1 CAPSULE BY MOUTH EVERY DAY FOR 3 WEEKS ON THEN 1 WEEK OFF FOR A 28 DAY SUPPLY 21 capsule 0   magnesium oxide (MAG-OX) 400 (241.3 Mg) MG tablet Take 1 tablet (400 mg total) by mouth daily. 60 tablet 3   magnesium oxide (MAG-OX) 400 MG tablet Take 1 tablet by mouth  daily.     potassium chloride (KLOR-CON) 10 MEQ tablet Take 1 tablet (10 mEq total) by mouth 2 (two) times daily. 180 tablet 3   prochlorperazine (COMPAZINE) 10 MG tablet Take 1 tablet (10 mg total) by mouth every 6 (six) hours as needed for nausea or vomiting. (Patient not taking: Reported on 04/26/2021) 45 tablet 2   Vitamin D, Ergocalciferol, (DRISDOL) 1.25 MG (50000 UNIT) CAPS capsule TAKE 1 CAPSULE BY MOUTH ONE TIME PER WEEK 12 capsule 1   No current facility-administered medications for this visit.    ALLERGIES:  No Known Allergies  PHYSICAL EXAM:  Performance status (ECOG): 1 - Symptomatic but completely ambulatory  There were no vitals filed for this visit. Wt Readings from Last 3 Encounters:  03/29/21 152  lb 3.2 oz (69 kg)  03/01/21 152 lb 12.8 oz (69.3 kg)  02/01/21 157 lb 12.8 oz (71.6 kg)   Physical Exam Vitals reviewed.  Constitutional:      Appearance: Normal appearance.  Cardiovascular:     Rate and Rhythm: Normal rate and regular rhythm.     Pulses: Normal pulses.     Heart sounds: Normal heart sounds.  Pulmonary:     Effort: Pulmonary effort is normal.     Breath sounds: Normal breath sounds.  Neurological:     General: No focal deficit present.     Mental Status: She is alert and oriented to person, place, and time.  Psychiatric:        Mood and Affect: Mood normal.        Behavior: Behavior normal.     LABORATORY DATA:  I have reviewed the labs as listed.  CBC Latest Ref Rng & Units 04/26/2021 03/29/2021 03/21/2021  WBC 4.0 - 10.5 K/uL 2.5(L) 3.3(L) 2.7(L)  Hemoglobin 12.0 - 15.0 g/dL 11.6(L) 11.5(L) 12.0  Hematocrit 36.0 - 46.0 % 35.7(L) 35.3(L) 36.6  Platelets 150 - 400 K/uL 163 202 172   CMP Latest Ref Rng & Units 04/26/2021 03/29/2021 03/21/2021  Glucose 70 - 99 mg/dL 82 93 96  BUN 8 - 23 mg/dL 18 23 17   Creatinine 0.44 - 1.00 mg/dL 0.82 0.85 0.83  Sodium 135 - 145 mmol/L 135 135 138  Potassium 3.5 - 5.1 mmol/L 3.4(L) 3.9 3.4(L)  Chloride 98 - 111  mmol/L 99 99 101  CO2 22 - 32 mmol/L 29 29 29   Calcium 8.9 - 10.3 mg/dL 8.7(L) 8.9 9.1  Total Protein 6.5 - 8.1 g/dL 7.0 7.1 7.0  Total Bilirubin 0.3 - 1.2 mg/dL 0.7 0.4 0.6  Alkaline Phos 38 - 126 U/L 111 148(H) 243(H)  AST 15 - 41 U/L 26 23 123(H)  ALT 0 - 44 U/L 27 32 129(H)    DIAGNOSTIC IMAGING:  I have independently reviewed the scans and discussed with the patient. No results found.   ASSESSMENT:  1. 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  Revlimid started on 10/06/2019 due to delay. -Velcade held since 12/15/2019 due to gastric symptoms, which have resolved completely. -She was evaluated by Dr. Samule Ohm at [facility].  Recommended to have immediate bone marrow transplant. -She has compression fractures of T8, T10 and T12.  Also L1 and L3 compression fractures. -Revlimid and dexamethasone continued until 03/22/2020. 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 -Revlimid 10 mg daily started around first week of December 2021. - Revlimid dose decreased to 10 mg 3 weeks on 1 week off on 01/04/2021 due to worsening back pain.   2.  Upper back pain: -MRI on 07/13/2020 shows T7, T8, T12, L1 and L2 compression/superior endplate fractures.  Height loss is greatest and moderate at T7 and T8.  No evidence of bone lesions.   PLAN:  1.  IgA lambda plasma cell myeloma: - She does not report any new onset bone pains.  Denies any fevers, night sweats or weight loss. - Continue Revlimid 10 mg 3 weeks on/1 week off. - Myeloma panel is pending from today.  Last myeloma panel from 03/21/2021 showed normal SPEP, immunofixation and free light chains. - RTC 2 months for follow-up with repeat labs.   2.  Mid back pain: - She was previously evaluated by IR for vertebroplasty which was not  recommended. - Mid back pain has been stable in the last 2 months.  If it gets worse, consider repeat MRI of the  thoracic spine with and without contrast.  Gabapentin is also helping her pain.   3.  Bone protection: - Zometa started back on 08/10/2020.  Calcium is normal at 8.8.  Continue calcium supplements.  Continue monthly Zometa.   4.  ID prophylaxis: - Continue acyclovir and aspirin.   5.  Neuropathy: - Continue gabapentin at midday and bedtime as needed which is helping neuropathy and back pain..   6.  Hypomagnesemia: - Continue magnesium every other day.  Magnesium is normal at 1.9.   7.  Hypertension: - Continue HCTZ 25 mg daily.   8.  Posttransplant vaccination: - She received a Hib vaccine and DTaP at last visit.  She does not require any vaccination this time.   Orders placed this encounter:  No orders of the defined types were placed in this encounter.    Derek Jack, MD [facility] [phone]   I, Thana Ates, am acting as a scribe for Dr. Derek Jack.  I, Derek Jack MD, have reviewed the above documentation for accuracy and completeness, and I agree with the above.

## 2021-05-24 ENCOUNTER — Inpatient Hospital Stay (HOSPITAL_COMMUNITY): Payer: Medicare HMO | Attending: Hematology

## 2021-05-24 ENCOUNTER — Inpatient Hospital Stay (HOSPITAL_COMMUNITY): Payer: Medicare HMO

## 2021-05-24 ENCOUNTER — Other Ambulatory Visit: Payer: Self-pay

## 2021-05-24 ENCOUNTER — Inpatient Hospital Stay (HOSPITAL_BASED_OUTPATIENT_CLINIC_OR_DEPARTMENT_OTHER): Payer: Medicare HMO | Admitting: Hematology

## 2021-05-24 VITALS — BP 144/70 | HR 67 | Temp 96.7°F | Resp 18 | Wt 154.4 lb

## 2021-05-24 VITALS — BP 149/66 | HR 63 | Temp 96.7°F | Resp 18

## 2021-05-24 DIAGNOSIS — Z800 Family history of malignant neoplasm of digestive organs: Secondary | ICD-10-CM | POA: Diagnosis not present

## 2021-05-24 DIAGNOSIS — R200 Anesthesia of skin: Secondary | ICD-10-CM | POA: Diagnosis not present

## 2021-05-24 DIAGNOSIS — R202 Paresthesia of skin: Secondary | ICD-10-CM | POA: Insufficient documentation

## 2021-05-24 DIAGNOSIS — Z9484 Stem cells transplant status: Secondary | ICD-10-CM | POA: Diagnosis not present

## 2021-05-24 DIAGNOSIS — Z809 Family history of malignant neoplasm, unspecified: Secondary | ICD-10-CM | POA: Insufficient documentation

## 2021-05-24 DIAGNOSIS — G629 Polyneuropathy, unspecified: Secondary | ICD-10-CM | POA: Insufficient documentation

## 2021-05-24 DIAGNOSIS — Z8379 Family history of other diseases of the digestive system: Secondary | ICD-10-CM | POA: Insufficient documentation

## 2021-05-24 DIAGNOSIS — C9000 Multiple myeloma not having achieved remission: Secondary | ICD-10-CM | POA: Insufficient documentation

## 2021-05-24 DIAGNOSIS — Z818 Family history of other mental and behavioral disorders: Secondary | ICD-10-CM | POA: Diagnosis not present

## 2021-05-24 DIAGNOSIS — Z833 Family history of diabetes mellitus: Secondary | ICD-10-CM | POA: Diagnosis not present

## 2021-05-24 DIAGNOSIS — Z8249 Family history of ischemic heart disease and other diseases of the circulatory system: Secondary | ICD-10-CM | POA: Diagnosis not present

## 2021-05-24 DIAGNOSIS — M546 Pain in thoracic spine: Secondary | ICD-10-CM | POA: Diagnosis not present

## 2021-05-24 DIAGNOSIS — Z79899 Other long term (current) drug therapy: Secondary | ICD-10-CM | POA: Insufficient documentation

## 2021-05-24 LAB — CBC WITH DIFFERENTIAL/PLATELET
Abs Immature Granulocytes: 0.01 10*3/uL (ref 0.00–0.07)
Basophils Absolute: 0 10*3/uL (ref 0.0–0.1)
Basophils Relative: 1 %
Eosinophils Absolute: 0.2 10*3/uL (ref 0.0–0.5)
Eosinophils Relative: 5 %
HCT: 35.7 % — ABNORMAL LOW (ref 36.0–46.0)
Hemoglobin: 11.7 g/dL — ABNORMAL LOW (ref 12.0–15.0)
Immature Granulocytes: 0 %
Lymphocytes Relative: 31 %
Lymphs Abs: 0.9 10*3/uL (ref 0.7–4.0)
MCH: 31.8 pg (ref 26.0–34.0)
MCHC: 32.8 g/dL (ref 30.0–36.0)
MCV: 97 fL (ref 80.0–100.0)
Monocytes Absolute: 0.3 10*3/uL (ref 0.1–1.0)
Monocytes Relative: 10 %
Neutro Abs: 1.5 10*3/uL — ABNORMAL LOW (ref 1.7–7.7)
Neutrophils Relative %: 53 %
Platelets: 186 10*3/uL (ref 150–400)
RBC: 3.68 MIL/uL — ABNORMAL LOW (ref 3.87–5.11)
RDW: 14.3 % (ref 11.5–15.5)
WBC: 2.8 10*3/uL — ABNORMAL LOW (ref 4.0–10.5)
nRBC: 0 % (ref 0.0–0.2)

## 2021-05-24 LAB — COMPREHENSIVE METABOLIC PANEL
ALT: 25 U/L (ref 0–44)
AST: 23 U/L (ref 15–41)
Albumin: 3.7 g/dL (ref 3.5–5.0)
Alkaline Phosphatase: 128 U/L — ABNORMAL HIGH (ref 38–126)
Anion gap: 8 (ref 5–15)
BUN: 16 mg/dL (ref 8–23)
CO2: 27 mmol/L (ref 22–32)
Calcium: 8.8 mg/dL — ABNORMAL LOW (ref 8.9–10.3)
Chloride: 101 mmol/L (ref 98–111)
Creatinine, Ser: 0.82 mg/dL (ref 0.44–1.00)
GFR, Estimated: >60 mL/min (ref >60)
Glucose, Bld: 91 mg/dL (ref 70–99)
Potassium: 3.4 mmol/L — ABNORMAL LOW (ref 3.5–5.1)
Sodium: 136 mmol/L (ref 135–145)
Total Bilirubin: 0.6 mg/dL (ref 0.3–1.2)
Total Protein: 7 g/dL (ref 6.5–8.1)

## 2021-05-24 LAB — MAGNESIUM: Magnesium: 1.9 mg/dL (ref 1.7–2.4)

## 2021-05-24 LAB — LACTATE DEHYDROGENASE: LDH: 109 U/L (ref 98–192)

## 2021-05-24 MED ORDER — ZOLEDRONIC ACID 4 MG/100ML IV SOLN
4.0000 mg | Freq: Once | INTRAVENOUS | Status: AC
  Administered 2021-05-24: 4 mg via INTRAVENOUS
  Filled 2021-05-24: qty 100

## 2021-05-24 MED ORDER — POTASSIUM CHLORIDE CRYS ER 20 MEQ PO TBCR
40.0000 meq | EXTENDED_RELEASE_TABLET | Freq: Once | ORAL | Status: AC
  Administered 2021-05-24: 40 meq via ORAL
  Filled 2021-05-24: qty 2

## 2021-05-24 MED ORDER — SODIUM CHLORIDE 0.9 % IV SOLN: Freq: Once | INTRAVENOUS | Status: AC

## 2021-05-24 NOTE — Progress Notes (Signed)
Patient has been assessed, vital signs and labs have been reviewed by Dr. Katragadda. ANC, Creatinine, LFTs, and Platelets are within treatment parameters per Dr. Katragadda. The patient is good to proceed with treatment at this time. Primary RN and pharmacy aware.  

## 2021-05-24 NOTE — Patient Instructions (Signed)
Stephanie Galvan  Discharge Instructions: Thank you for choosing [facility] to provide your oncology and hematology care.  If you have a lab appointment with the [facility], please come in thru the Main Entrance and check in at the main information desk.  Wear comfortable clothing and clothing appropriate for easy access to any Portacath or PICC line.   We strive to give you quality time with your provider. You may need to reschedule your appointment if you arrive late (15 or more minutes).  Arriving late affects you and other patients whose appointments are after yours.  Also, if you miss three or more appointments without notifying the office, you may be dismissed from the clinic at the provider's discretion.      For prescription refill requests, have your pharmacy contact our office and allow 72 hours for refills to be completed.    Today you received the following chemotherapy and/or immunotherapy agents Zometa      To help prevent nausea and vomiting after your treatment, we encourage you to take your nausea medication as directed.  BELOW ARE SYMPTOMS THAT SHOULD BE REPORTED IMMEDIATELY: *FEVER GREATER THAN 100.4 F (38 C) OR HIGHER *CHILLS OR SWEATING *NAUSEA AND VOMITING THAT IS NOT CONTROLLED WITH YOUR NAUSEA MEDICATION *UNUSUAL SHORTNESS OF BREATH *UNUSUAL BRUISING OR BLEEDING *URINARY PROBLEMS (pain or burning when urinating, or frequent urination) *BOWEL PROBLEMS (unusual diarrhea, constipation, pain near the anus) TENDERNESS IN MOUTH AND THROAT WITH OR WITHOUT PRESENCE OF ULCERS (sore throat, sores in mouth, or a toothache) UNUSUAL RASH, SWELLING OR PAIN  UNUSUAL VAGINAL DISCHARGE OR ITCHING   Items with * indicate a potential emergency and should be followed up as soon as possible or go to the Emergency Department if any problems should occur.  Please show the CHEMOTHERAPY ALERT CARD or IMMUNOTHERAPY ALERT CARD at check-in to the Emergency  Department and triage nurse.  Should you have questions after your visit or need to cancel or reschedule your appointment, please contact [facility] [phone]  and follow the prompts.  Office hours are 8:00 a.m. to 4:30 p.m. Monday - Friday. Please note that voicemails left after 4:00 p.m. may not be returned until the following business day.  We are closed weekends and major holidays. You have access to a nurse at all times for urgent questions. Please call the main number to the clinic [phone] and follow the prompts.  For any non-urgent questions, you may also contact your provider using MyChart. We now offer e-Visits for anyone 35 and older to request care online for non-urgent symptoms. For details visit [email or web address].   Also download the MyChart app! Go to the app store, search "MyChart", open the app, select [place] [facility], and log in with your MyChart username and password.  Due to Covid, a mask is required upon entering the hospital/clinic. If you do not have a mask, one will be given to you upon arrival. For doctor visits, patients may have 1 support person aged 44 or older with them. For treatment visits, patients cannot have anyone with them due to current Covid guidelines and our immunocompromised population.   Zoledronic Acid Injection (Hypercalcemia, Oncology) What is this medication? ZOLEDRONIC ACID (ZOE le dron ik AS id) slows calcium loss from bones. It high calcium levels in the blood from some kinds of cancer. It may be used in other people at risk for bone loss. This medicine may be used for other purposes; ask your health care  provider or pharmacist if you have questions. COMMON BRAND NAME(S): Zometa What should I tell my care team before I take this medication? They need to know if you have any of these conditions: cancer dehydration dental disease kidney disease liver disease low levels of calcium in the blood lung or breathing disease  (asthma) receiving steroids like dexamethasone or prednisone an unusual or allergic reaction to zoledronic acid, other medicines, foods, dyes, or preservatives pregnant or trying to get pregnant breast-feeding How should I use this medication? This drug is injected into a vein. It is given by a health care provider in a hospital or clinic setting. Talk to your health care provider about the use of this drug in children. Special care may be needed. Overdosage: If you think you have taken too much of this medicine contact a poison control center or emergency room at once. NOTE: This medicine is only for you. Do not share this medicine with others. What if I miss a dose? Keep appointments for follow-up doses. It is important not to miss your dose. Call your health care provider if you are unable to keep an appointment. What may interact with this medication? certain antibiotics given by injection NSAIDs, medicines for pain and inflammation, like ibuprofen or naproxen some diuretics like bumetanide, furosemide teriparatide thalidomide This list may not describe all possible interactions. Give your health care provider a list of all the medicines, herbs, non-prescription drugs, or dietary supplements you use. Also tell them if you smoke, drink alcohol, or use illegal drugs. Some items may interact with your medicine. What should I watch for while using this medication? Visit your health care provider for regular checks on your progress. It may be some time before you see the benefit from this drug. Some people who take this drug have severe bone, joint, or muscle pain. This drug may also increase your risk for jaw problems or a broken thigh bone. Tell your health care provider right away if you have severe pain in your jaw, bones, joints, or muscles. Tell you health care provider if you have any pain that does not go away or that gets worse. Tell your dentist and dental surgeon that you are taking  this drug. You should not have major dental surgery while on this drug. See your dentist to have a dental exam and fix any dental problems before starting this drug. Take good care of your teeth while on this drug. Make sure you see your dentist for regular follow-up appointments. You should make sure you get enough calcium and vitamin D while you are taking this drug. Discuss the foods you eat and the vitamins you take with your health care provider. Check with your health care provider if you have severe diarrhea, nausea, and vomiting, or if you sweat a lot. The loss of too much body fluid may make it dangerous for you to take this drug. You may need blood work done while you are taking this drug. Do not become pregnant while taking this drug. Women should inform their health care provider if they wish to become pregnant or think they might be pregnant. There is potential for serious harm to an unborn child. Talk to your health care provider for more information. What side effects may I notice from receiving this medication? Side effects that you should report to your doctor or health care provider as soon as possible: allergic reactions (skin rash, itching or hives; swelling of the face, lips, or tongue) bone pain  infection (fever, chills, cough, sore throat, pain or trouble passing urine) jaw pain, especially after dental work joint pain kidney injury (trouble passing urine or change in the amount of urine) low blood pressure (dizziness; feeling faint or lightheaded, falls; unusually weak or tired) low calcium levels (fast heartbeat; muscle cramps or pain; pain, tingling, or numbness in the hands or feet; seizures) low magnesium levels (fast, irregular heartbeat; muscle cramp or pain; muscle weakness; tremors; seizures) low red blood cell counts (trouble breathing; feeling faint; lightheaded, falls; unusually weak or tired) muscle pain redness, blistering, peeling, or loosening of the skin,  including inside the mouth severe diarrhea swelling of the ankles, feet, hands trouble breathing Side effects that usually do not require medical attention (report to your doctor or health care provider if they continue or are bothersome): anxious constipation coughing depressed mood eye irritation, itching, or pain fever general ill feeling or flu-like symptoms nausea pain, redness, or irritation at site where injected trouble sleeping This list may not describe all possible side effects. Call your doctor for medical advice about side effects. You may report side effects to FDA at 1-800-FDA-1088. Where should I keep my medication? This drug is given in a hospital or clinic. It will not be stored at home. NOTE: This sheet is a summary. It may not cover all possible information. If you have questions about this medicine, talk to your doctor, pharmacist, or health care provider.  2022 [employer]vier/Gold Standard (2019-06-11 09:13:00)

## 2021-05-24 NOTE — Progress Notes (Signed)
Calcium 8.8, Potassium 3.4.  40 meq PO ordered per standing order. Okay for Zometa today per Dr Raliegh Ip.   Tolerated treatment well today.  Vital signs stable prior to discharge.  AVS reviewed.  Stable during and after treatment.  Discharged in stable condition ambulatory.

## 2021-05-24 NOTE — Patient Instructions (Signed)
[facility] at [facility] Discharge Instructions  You were seen today by Dr. Katragadda. He went over your recent results, and you received your injection. Dr. Katragadda will see you back in 2 months for labs and follow up.   Thank you for choosing [facility] at [facility] to provide your oncology and hematology care.  To afford each patient quality time with our provider, please arrive at least 15 minutes before your scheduled appointment time.   If you have a lab appointment with the Cancer Center please come in thru the Main Entrance and check in at the main information desk  You need to re-schedule your appointment should you arrive 10 or more minutes late.  We strive to give you quality time with our providers, and arriving late affects you and other patients whose appointments are after yours.  Also, if you no show three or more times for appointments you may be dismissed from the clinic at the providers discretion.     Again, thank you for choosing [facility].  Our hope is that these requests will decrease the amount of time that you wait before being seen by our physicians.       _____________________________________________________________  Should you have questions after your visit to  Cancer Center, please contact our office at [phone] between the hours of 8:00 a.m. and 4:30 p.m.  Voicemails left after 4:00 p.m. will not be returned until the following business day.  For prescription refill requests, have your pharmacy contact our office and allow 72 hours.    Cancer Center Support Programs:   > Cancer Support Group  2nd Tuesday of the month 1pm-2pm, Journey Room   

## 2021-05-24 NOTE — Progress Notes (Signed)
Patient is taking Revlimid as prescribed.  They have not missed any doses and report no side effects at this time.

## 2021-05-25 LAB — KAPPA/LAMBDA LIGHT CHAINS
Kappa free light chain: 27.5 mg/L — ABNORMAL HIGH (ref 3.3–19.4)
Kappa, lambda light chain ratio: 1.13 (ref 0.26–1.65)
Lambda free light chains: 24.4 mg/L (ref 5.7–26.3)

## 2021-05-26 LAB — PROTEIN ELECTROPHORESIS, SERUM
A/G Ratio: 1.1 (ref 0.7–1.7)
Albumin ELP: 3.4 g/dL (ref 2.9–4.4)
Alpha-1-Globulin: 0.2 g/dL (ref 0.0–0.4)
Alpha-2-Globulin: 0.7 g/dL (ref 0.4–1.0)
Beta Globulin: 1 g/dL (ref 0.7–1.3)
Gamma Globulin: 1 g/dL (ref 0.4–1.8)
Globulin, Total: 3 g/dL (ref 2.2–3.9)
Total Protein ELP: 6.4 g/dL (ref 6.0–8.5)

## 2021-05-27 LAB — IMMUNOFIXATION ELECTROPHORESIS
IgA: 129 mg/dL (ref 87–352)
IgG (Immunoglobin G), Serum: 1092 mg/dL (ref 586–1602)
IgM (Immunoglobulin M), Srm: 34 mg/dL (ref 26–217)
Total Protein ELP: 6.4 g/dL (ref 6.0–8.5)

## 2021-06-02 ENCOUNTER — Other Ambulatory Visit (HOSPITAL_COMMUNITY): Payer: Self-pay | Admitting: Hematology

## 2021-06-02 DIAGNOSIS — C9001 Multiple myeloma in remission: Secondary | ICD-10-CM

## 2021-06-07 ENCOUNTER — Encounter (HOSPITAL_COMMUNITY): Payer: Self-pay | Admitting: Hematology

## 2021-06-07 NOTE — Telephone Encounter (Signed)
Chart reviewed. Revlimid refilled per last office note with Dr. Katragadda.  

## 2021-06-20 ENCOUNTER — Other Ambulatory Visit (HOSPITAL_COMMUNITY): Payer: Self-pay

## 2021-06-20 DIAGNOSIS — C9000 Multiple myeloma not having achieved remission: Secondary | ICD-10-CM

## 2021-06-20 DIAGNOSIS — C50912 Malignant neoplasm of unspecified site of left female breast: Secondary | ICD-10-CM

## 2021-06-21 ENCOUNTER — Inpatient Hospital Stay (HOSPITAL_COMMUNITY): Payer: Medicare HMO | Attending: Hematology

## 2021-06-21 ENCOUNTER — Other Ambulatory Visit (HOSPITAL_COMMUNITY): Payer: Self-pay | Admitting: Family Medicine

## 2021-06-21 ENCOUNTER — Inpatient Hospital Stay (HOSPITAL_COMMUNITY): Payer: Medicare HMO

## 2021-06-21 ENCOUNTER — Other Ambulatory Visit: Payer: Self-pay

## 2021-06-21 VITALS — BP 155/77 | HR 74 | Temp 97.1°F | Resp 18 | Wt 153.6 lb

## 2021-06-21 DIAGNOSIS — Z8379 Family history of other diseases of the digestive system: Secondary | ICD-10-CM | POA: Diagnosis not present

## 2021-06-21 DIAGNOSIS — M549 Dorsalgia, unspecified: Secondary | ICD-10-CM | POA: Diagnosis not present

## 2021-06-21 DIAGNOSIS — Z800 Family history of malignant neoplasm of digestive organs: Secondary | ICD-10-CM | POA: Diagnosis not present

## 2021-06-21 DIAGNOSIS — Z8249 Family history of ischemic heart disease and other diseases of the circulatory system: Secondary | ICD-10-CM | POA: Diagnosis not present

## 2021-06-21 DIAGNOSIS — I10 Essential (primary) hypertension: Secondary | ICD-10-CM | POA: Diagnosis not present

## 2021-06-21 DIAGNOSIS — C9000 Multiple myeloma not having achieved remission: Secondary | ICD-10-CM | POA: Diagnosis present

## 2021-06-21 DIAGNOSIS — R200 Anesthesia of skin: Secondary | ICD-10-CM | POA: Diagnosis not present

## 2021-06-21 DIAGNOSIS — Z23 Encounter for immunization: Secondary | ICD-10-CM | POA: Insufficient documentation

## 2021-06-21 DIAGNOSIS — Z833 Family history of diabetes mellitus: Secondary | ICD-10-CM | POA: Insufficient documentation

## 2021-06-21 DIAGNOSIS — Z171 Estrogen receptor negative status [ER-]: Secondary | ICD-10-CM

## 2021-06-21 DIAGNOSIS — Z809 Family history of malignant neoplasm, unspecified: Secondary | ICD-10-CM | POA: Insufficient documentation

## 2021-06-21 DIAGNOSIS — Z1231 Encounter for screening mammogram for malignant neoplasm of breast: Secondary | ICD-10-CM

## 2021-06-21 DIAGNOSIS — Z79899 Other long term (current) drug therapy: Secondary | ICD-10-CM | POA: Diagnosis not present

## 2021-06-21 DIAGNOSIS — G629 Polyneuropathy, unspecified: Secondary | ICD-10-CM | POA: Insufficient documentation

## 2021-06-21 DIAGNOSIS — Z818 Family history of other mental and behavioral disorders: Secondary | ICD-10-CM | POA: Diagnosis not present

## 2021-06-21 DIAGNOSIS — C50912 Malignant neoplasm of unspecified site of left female breast: Secondary | ICD-10-CM

## 2021-06-21 LAB — COMPREHENSIVE METABOLIC PANEL
ALT: 50 U/L — ABNORMAL HIGH (ref 0–44)
AST: 35 U/L (ref 15–41)
Albumin: 3.8 g/dL (ref 3.5–5.0)
Alkaline Phosphatase: 165 U/L — ABNORMAL HIGH (ref 38–126)
Anion gap: 7 (ref 5–15)
BUN: 20 mg/dL (ref 8–23)
CO2: 29 mmol/L (ref 22–32)
Calcium: 8.9 mg/dL (ref 8.9–10.3)
Chloride: 102 mmol/L (ref 98–111)
Creatinine, Ser: 0.86 mg/dL (ref 0.44–1.00)
GFR, Estimated: >60 mL/min (ref >60)
Glucose, Bld: 87 mg/dL (ref 70–99)
Potassium: 3.1 mmol/L — ABNORMAL LOW (ref 3.5–5.1)
Sodium: 138 mmol/L (ref 135–145)
Total Bilirubin: 0.7 mg/dL (ref 0.3–1.2)
Total Protein: 7.1 g/dL (ref 6.5–8.1)

## 2021-06-21 LAB — CBC WITH DIFFERENTIAL/PLATELET
Abs Immature Granulocytes: 0.01 10*3/uL (ref 0.00–0.07)
Basophils Absolute: 0 10*3/uL (ref 0.0–0.1)
Basophils Relative: 1 %
Eosinophils Absolute: 0.1 10*3/uL (ref 0.0–0.5)
Eosinophils Relative: 4 %
HCT: 38.1 % (ref 36.0–46.0)
Hemoglobin: 12.3 g/dL (ref 12.0–15.0)
Immature Granulocytes: 0 %
Lymphocytes Relative: 31 %
Lymphs Abs: 0.9 10*3/uL (ref 0.7–4.0)
MCH: 30.7 pg (ref 26.0–34.0)
MCHC: 32.3 g/dL (ref 30.0–36.0)
MCV: 95 fL (ref 80.0–100.0)
Monocytes Absolute: 0.3 10*3/uL (ref 0.1–1.0)
Monocytes Relative: 10 %
Neutro Abs: 1.7 10*3/uL (ref 1.7–7.7)
Neutrophils Relative %: 54 %
Platelets: 188 10*3/uL (ref 150–400)
RBC: 4.01 MIL/uL (ref 3.87–5.11)
RDW: 14.3 % (ref 11.5–15.5)
WBC: 3.1 10*3/uL — ABNORMAL LOW (ref 4.0–10.5)
nRBC: 0 % (ref 0.0–0.2)

## 2021-06-21 MED ORDER — SODIUM CHLORIDE 0.9% FLUSH
10.0000 mL | Freq: Once | INTRAVENOUS | Status: DC | PRN
Start: 2021-06-21 — End: 2021-06-21

## 2021-06-21 MED ORDER — DTAP-IPV-HIB VACCINE IM SUSR
0.5000 mL | Freq: Once | INTRAMUSCULAR | Status: AC
  Administered 2021-06-21: 0.5 mL via INTRAMUSCULAR
  Filled 2021-06-21: qty 1

## 2021-06-21 MED ORDER — INFLUENZA VAC A&B SA ADJ QUAD 0.5 ML IM PRSY
0.5000 mL | PREFILLED_SYRINGE | Freq: Once | INTRAMUSCULAR | Status: AC
  Administered 2021-06-21: 0.5 mL via INTRAMUSCULAR
  Filled 2021-06-21: qty 0.5

## 2021-06-21 MED ORDER — PNEUMOCOCCAL 13-VAL CONJ VACC IM SUSP
0.5000 mL | Freq: Once | INTRAMUSCULAR | Status: AC
  Administered 2021-06-21: 0.5 mL via INTRAMUSCULAR
  Filled 2021-06-21: qty 0.5

## 2021-06-21 MED ORDER — HEPARIN SOD (PORK) LOCK FLUSH 100 UNIT/ML IV SOLN: 500.0000 [IU] | Freq: Once | INTRAVENOUS | Status: DC | PRN

## 2021-06-21 MED ORDER — POTASSIUM CHLORIDE CRYS ER 20 MEQ PO TBCR
40.0000 meq | EXTENDED_RELEASE_TABLET | Freq: Once | ORAL | Status: AC
  Administered 2021-06-21: 40 meq via ORAL
  Filled 2021-06-21: qty 2

## 2021-06-21 MED ORDER — HEPARIN SOD (PORK) LOCK FLUSH 100 UNIT/ML IV SOLN: 250.0000 [IU] | Freq: Once | INTRAVENOUS | Status: DC | PRN

## 2021-06-21 MED ORDER — ALTEPLASE 2 MG IJ SOLR
2.0000 mg | Freq: Once | INTRAMUSCULAR | Status: DC | PRN
Start: 2021-06-21 — End: 2021-06-21

## 2021-06-21 MED ORDER — ZOLEDRONIC ACID 4 MG/100ML IV SOLN
4.0000 mg | Freq: Once | INTRAVENOUS | Status: AC
  Administered 2021-06-21: 4 mg via INTRAVENOUS
  Filled 2021-06-21: qty 100

## 2021-06-21 MED ORDER — SODIUM CHLORIDE 0.9 % IV SOLN: Freq: Once | INTRAVENOUS | Status: AC

## 2021-06-21 MED ORDER — HEPATITIS B VAC RECOMBINANT 20 MCG/ML IJ SUSP
2.0000 mL | Freq: Once | INTRAMUSCULAR | Status: AC
  Administered 2021-06-21: 40 ug via INTRAMUSCULAR
  Filled 2021-06-21: qty 2

## 2021-06-21 MED ORDER — SODIUM CHLORIDE 0.9% FLUSH: 3.0000 mL | Freq: Once | INTRAVENOUS | Status: DC | PRN

## 2021-06-21 NOTE — Patient Instructions (Signed)
[facility]  Discharge Instructions: Thank you for choosing [facility] to provide your oncology and hematology care.  If you have a lab appointment with the [facility], please come in thru the Main Entrance and check in at the main information desk.  Wear comfortable clothing and clothing appropriate for easy access to any Portacath or PICC line.   We strive to give you quality time with your provider. You may need to reschedule your appointment if you arrive late (15 or more minutes).  Arriving late affects you and other patients whose appointments are after yours.  Also, if you miss three or more appointments without notifying the office, you may be dismissed from the clinic at the provider's discretion.      For prescription refill requests, have your pharmacy contact our office and allow 72 hours for refills to be completed.    Today you received the following chemotherapy and/or immunotherapy agents Zometa, Influenza, Prevnar 13, Hep B, and Pentacel      To help prevent nausea and vomiting after your treatment, we encourage you to take your nausea medication as directed.  BELOW ARE SYMPTOMS THAT SHOULD BE REPORTED IMMEDIATELY: *FEVER GREATER THAN 100.4 F (38 C) OR HIGHER *CHILLS OR SWEATING *NAUSEA AND VOMITING THAT IS NOT CONTROLLED WITH YOUR NAUSEA MEDICATION *UNUSUAL SHORTNESS OF BREATH *UNUSUAL BRUISING OR BLEEDING *URINARY PROBLEMS (pain or burning when urinating, or frequent urination) *BOWEL PROBLEMS (unusual diarrhea, constipation, pain near the anus) TENDERNESS IN MOUTH AND THROAT WITH OR WITHOUT PRESENCE OF ULCERS (sore throat, sores in mouth, or a toothache) UNUSUAL RASH, SWELLING OR PAIN  UNUSUAL VAGINAL DISCHARGE OR ITCHING   Items with * indicate a potential emergency and should be followed up as soon as possible or go to the Emergency Department if any problems should occur.  Please show the CHEMOTHERAPY ALERT CARD or IMMUNOTHERAPY ALERT  CARD at check-in to the Emergency Department and triage nurse.  Should you have questions after your visit or need to cancel or reschedule your appointment, please contact [facility] [phone]  and follow the prompts.  Office hours are 8:00 a.m. to 4:30 p.m. Monday - Friday. Please note that voicemails left after 4:00 p.m. may not be returned until the following business day.  We are closed weekends and major holidays. You have access to a nurse at all times for urgent questions. Please call the main number to the clinic [phone] and follow the prompts.  For any non-urgent questions, you may also contact your provider using MyChart. We now offer e-Visits for anyone 66 and older to request care online for non-urgent symptoms. For details visit [email or web address].   Also download the MyChart app! Go to the app store, search "MyChart", open the app, select [place], and log in with your MyChart username and password.  Due to Covid, a mask is required upon entering the hospital/clinic. If you do not have a mask, one will be given to you upon arrival. For doctor visits, patients may have 1 support person aged 66 or older with them. For treatment visits, patients cannot have anyone with them due to current Covid guidelines and our immunocompromised population.

## 2021-06-21 NOTE — Progress Notes (Signed)
Patient presents today for Zometa infusion, Pentacel, Hep B, Prevnar 13, and influenza vaccines per providers order.  Vital signs and Labs within parameters for treatment.  Potassium noted to be 3.1, 61mEq of Potassium PO given to the patient per Dr. Delton Coombes.    Stable during vaccine administration without incident; injection sites WNL; see MAR for injection details.  Treatment given today per MD orders.  Zometa infusion without adverse affects.  Vital signs stable.  No complaints at this time.  Discharge from clinic ambulatory in stable condition.  Alert and oriented X 3.  Follow up with [facility] as scheduled.

## 2021-06-25 ENCOUNTER — Other Ambulatory Visit (HOSPITAL_COMMUNITY): Payer: Self-pay | Admitting: Hematology

## 2021-06-25 DIAGNOSIS — C9001 Multiple myeloma in remission: Secondary | ICD-10-CM

## 2021-06-29 ENCOUNTER — Encounter (HOSPITAL_COMMUNITY): Payer: Self-pay | Admitting: Hematology

## 2021-06-29 NOTE — Telephone Encounter (Signed)
Chart reviewed. Revlimid refilled per last office note with Dr. Katragadda.  

## 2021-07-04 ENCOUNTER — Other Ambulatory Visit (HOSPITAL_COMMUNITY): Payer: Self-pay | Admitting: *Deleted

## 2021-07-04 MED ORDER — VITAMIN D (ERGOCALCIFEROL) 1.25 MG (50000 UNIT) PO CAPS: ORAL_CAPSULE | ORAL | 1 refills | Status: DC

## 2021-07-10 ENCOUNTER — Other Ambulatory Visit: Payer: Self-pay

## 2021-07-10 ENCOUNTER — Ambulatory Visit (HOSPITAL_COMMUNITY)
Admission: RE | Admit: 2021-07-10 | Discharge: 2021-07-10 | Disposition: A | Discharge status: Home / Self Care | Payer: Medicare HMO | Source: Ambulatory Visit | Attending: Family Medicine | Admitting: Family Medicine

## 2021-07-10 DIAGNOSIS — Z1231 Encounter for screening mammogram for malignant neoplasm of breast: Secondary | ICD-10-CM | POA: Insufficient documentation

## 2021-07-18 NOTE — Progress Notes (Signed)
[place], [place] [address]   CLINIC:  Medical Oncology/Hematology  PCP:  [name], MD [place] / [place] [address] [phone]   REASON FOR VISIT:  Follow-up for multiple myeloma  PRIOR THERAPY:  1. RVD x 6 cycles from 08/19/2019 to 12/15/2019. 2. 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  NGS Results: not done  CURRENT THERAPY: Revlimid 10 mg daily; Zometa weekly  BRIEF ONCOLOGIC HISTORY:  Oncology History  Multiple myeloma ([facility])  08/22/2019 Initial Diagnosis   Multiple myeloma ([facility])   08/22/2019 Cancer Staging   Staging form: Plasma Cell Myeloma and Plasma Cell Disorders, AJCC 8th Edition - Clinical stage from 08/22/2019: RISS Stage II (Beta-2-microglobulin (mg/L): 3.8, Albumin (g/dL): 3.2, ISS: Stage II, High-risk cytogenetics: Absent, LDH: Normal) - Signed by Derek Jack, MD on 08/22/2019      CANCER STAGING: Cancer Staging Multiple myeloma ([facility]) Staging form: Plasma Cell Myeloma and Plasma Cell Disorders, AJCC 8th Edition - Clinical stage from 08/22/2019: RISS Stage II (Beta-2-microglobulin (mg/L): 3.8, Albumin (g/dL): 3.2, ISS: Stage II, High-risk cytogenetics: Absent, LDH: Normal) - Signed by Derek Jack, MD on 08/22/2019   INTERVAL HISTORY:  [name], a 65 y.o. female, returns for routine follow-up of her multiple myeloma. Stephanie Galvan was last seen on 05/24/2021.   Today she reports feeling good. She reports continued mid back pain which is stable with Gabapentin once daily. She reports one episode of constipation. She denies jaw pains, skin rash, n/v/d. She also reports one episode of pain in her left hand which has since resolved. She reports occasional itching on her mid back.  REVIEW OF SYSTEMS:  Review of Systems  Constitutional:  Negative for appetite change and fatigue (70%).  Gastrointestinal:  Positive for constipation. Negative for diarrhea, nausea and vomiting.   Musculoskeletal:  Positive for back pain (mid back).  Skin:  Positive for itching. Negative for rash.  Neurological:  Positive for numbness.  All other systems reviewed and are negative.  PAST MEDICAL/SURGICAL HISTORY:  Past Medical History:  Diagnosis Date   Breast cancer ([facility]) 2006   left breast   Breast mass, right    Compression fracture of cervical spine ([facility]) 06/18/2019   recent Chest xray stating had fx.   High cholesterol    History of bladder infections    Hypertension    Rash Aug 2013   Past Surgical History:  Procedure Laterality Date   ABDOMINAL HYSTERECTOMY     just uterus removed   BREAST BIOPSY     BREAST LUMPECTOMY WITH RADIOACTIVE SEED LOCALIZATION Right 07/24/2018   Procedure: RIGHT BREAST LUMPECTOMY WITH RADIOACTIVE SEED LOCALIZATION;  Surgeon: Coralie Keens, MD;  Location: [facility];  Service: General;  Laterality: Right;   IR RADIOLOGIST EVAL & MGMT  07/28/2020   MASTECTOMY  5/06   left   PARTIAL HYSTERECTOMY     PORT-A-CATH REMOVAL     PORTACATH PLACEMENT      SOCIAL HISTORY:  Social History   Socioeconomic History   Marital status: Married    Spouse name: Not on file   Number of children: 2   Years of education: Not on file   Highest education level: Not on file  Occupational History    Employer: [employer]  Tobacco Use   Smoking status: Never   Smokeless tobacco: Never  Vaping Use   Vaping Use: Never used  Substance and Sexual Activity   Alcohol use: No   Drug use: No  Sexual activity: Yes    Birth control/protection: Surgical  Other Topics Concern   Not on file  Social History Narrative   Not on file   Social Determinants of Health   Financial Resource Strain: Low Risk    Difficulty of Paying Living Expenses: Not hard at all  Food Insecurity: No Food Insecurity   Worried About Charity fundraiser in the Last Year: Never true   [place] in the Last Year: Never true  Transportation Needs: No  Transportation Needs   Lack of Transportation (Medical): No   Lack of Transportation (Non-Medical): No  Physical Activity: Inactive   Days of Exercise per Week: 0 days   Minutes of Exercise per Session: 0 min  Stress: No Stress Concern Present   Feeling of Stress : Not at all  Social Connections: Moderately Integrated   Frequency of Communication with Friends and Family: More than three times a week   Frequency of Social Gatherings with Friends and Family: Never   Attends Religious Services: More than 4 times per year   Active Member of [employer] or Organizations: No   Attends Music therapist: Never   Marital Status: Married  Human resources officer Violence: Not At Risk   Fear of Current or Ex-Partner: No   Emotionally Abused: No   Physically Abused: No   Sexually Abused: No    FAMILY HISTORY:  Family History  Problem Relation Age of Onset   Dementia Mother    Stomach cancer Father    Cancer Brother    Cancer Brother    Diabetes Brother    Colitis Daughter    Hypertension Daughter    Hypertension Daughter     CURRENT MEDICATIONS:  Current Outpatient Medications  Medication Sig Dispense Refill   aspirin EC 81 MG tablet Take 81 mg by mouth daily. Swallow whole.     calcium carbonate (TUMS - DOSED IN MG ELEMENTAL CALCIUM) 500 MG chewable tablet Chew 2 tablets by mouth 2 (two) times daily.     gabapentin (NEURONTIN) 300 MG capsule Take 1 capsule (300 mg total) by mouth daily. 30 capsule 3   hydrochlorothiazide (MICROZIDE) 12.5 MG capsule Take by mouth.     HYDROcodone-acetaminophen (NORCO) 10-325 MG tablet Take 1 tablet by mouth 2 (two) times daily as needed for moderate pain. 45 tablet 0   lenalidomide (REVLIMID) 10 MG capsule TAKE 1 CAPSULE BY MOUTH EVERY DAY FOR 3 WEEKS ON THEN 1 WEEK OFF FOR A 28 DAY SUPPLY 21 capsule 0   magnesium oxide (MAG-OX) 400 (241.3 Mg) MG tablet Take 1 tablet (400 mg total) by mouth daily. 60 tablet 3   magnesium oxide (MAG-OX) 400 MG tablet  Take 1 tablet by mouth daily.     potassium chloride (KLOR-CON) 10 MEQ tablet Take 1 tablet (10 mEq total) by mouth 2 (two) times daily. 180 tablet 3   Vitamin D, Ergocalciferol, (DRISDOL) 1.25 MG (50000 UNIT) CAPS capsule TAKE 1 CAPSULE BY MOUTH ONE TIME PER WEEK 12 capsule 1   furosemide (LASIX) 20 MG tablet Take 1 tablet (20 mg total) by mouth daily as needed for fluid. (Patient not taking: Reported on 07/19/2021) 30 tablet 6   prochlorperazine (COMPAZINE) 10 MG tablet Take 1 tablet (10 mg total) by mouth every 6 (six) hours as needed for nausea or vomiting. (Patient not taking: Reported on 07/19/2021) 45 tablet 2   No current facility-administered medications for this visit.    ALLERGIES:  No Known Allergies  PHYSICAL EXAM:  Performance status (ECOG): 1 - Symptomatic but completely ambulatory  Vitals:   07/19/21 1306  BP: (!) 146/88  Pulse: 75  Resp: 18  Temp: 98.3 F (36.8 C)  SpO2: 100%   Wt Readings from Last 3 Encounters:  07/19/21 154 lb 6.4 oz (70 kg)  06/21/21 153 lb 9.6 oz (69.7 kg)  05/24/21 154 lb 6.4 oz (70 kg)   Physical Exam Vitals reviewed.  Constitutional:      Appearance: Normal appearance.  Cardiovascular:     Rate and Rhythm: Normal rate and regular rhythm.     Pulses: Normal pulses.     Heart sounds: Normal heart sounds.  Pulmonary:     Effort: Pulmonary effort is normal.     Breath sounds: Normal breath sounds.  Skin:    Findings: No lesion or rash.  Neurological:     General: No focal deficit present.     Mental Status: She is alert and oriented to person, place, and time.  Psychiatric:        Mood and Affect: Mood normal.        Behavior: Behavior normal.     LABORATORY DATA:  I have reviewed the labs as listed.  CBC Latest Ref Rng & Units 07/19/2021 06/21/2021 05/24/2021  WBC 4.0 - 10.5 K/uL 2.6(L) 3.1(L) 2.8(L)  Hemoglobin 12.0 - 15.0 g/dL 11.8(L) 12.3 11.7(L)  Hematocrit 36.0 - 46.0 % 34.9(L) 38.1 35.7(L)  Platelets 150 - 400 K/uL 176  188 186   CMP Latest Ref Rng & Units 07/19/2021 06/21/2021 05/24/2021  Glucose 70 - 99 mg/dL 96 87 91  BUN 8 - 23 mg/dL 18 20 16   Creatinine 0.44 - 1.00 mg/dL 0.77 0.86 0.82  Sodium 135 - 145 mmol/L 138 138 136  Potassium 3.5 - 5.1 mmol/L 3.3(L) 3.1(L) 3.4(L)  Chloride 98 - 111 mmol/L 101 102 101  CO2 22 - 32 mmol/L 27 29 27   Calcium 8.9 - 10.3 mg/dL 9.1 8.9 8.8(L)  Total Protein 6.5 - 8.1 g/dL 6.9 7.1 7.0  Total Bilirubin 0.3 - 1.2 mg/dL 0.4 0.7 0.6  Alkaline Phos 38 - 126 U/L 171(H) 165(H) 128(H)  AST 15 - 41 U/L 53(H) 35 23  ALT 0 - 44 U/L 61(H) 50(H) 25    DIAGNOSTIC IMAGING:  I have independently reviewed the scans and discussed with the patient. MM 3D SCREEN BREAST UNI RIGHT  Result Date: 07/11/2021 CLINICAL DATA:  Screening. EXAM: DIGITAL SCREENING UNILATERAL RIGHT MAMMOGRAM WITH CAD AND TOMOSYNTHESIS TECHNIQUE: Right screening digital craniocaudal and mediolateral oblique mammograms were obtained. Right screening digital breast tomosynthesis was performed. The images were evaluated with computer-aided detection. COMPARISON:  Previous exam(s). ACR Breast Density Category c: The breast tissue is heterogeneously dense, which may obscure small masses. FINDINGS: The patient has had a left mastectomy. There are no findings suspicious for malignancy. IMPRESSION: No mammographic evidence of malignancy. A result letter of this screening mammogram will be mailed directly to the patient. RECOMMENDATION: Screening mammogram in one year.  (Code:SM-R-60M) BI-RADS CATEGORY  1: Negative. Electronically Signed   By: Ammie Ferrier M.D.   On: 07/11/2021 13:29     ASSESSMENT:  1. 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  Revlimid started on 10/06/2019 due to delay. -Velcade held since 12/15/2019 due to gastric symptoms, which have resolved completely. -She was evaluated by Dr. Samule Ohm at [facility].  Recommended to have immediate bone  marrow transplant. -She has compression  fractures of T8, T10 and T12.  Also L1 and L3 compression fractures. -Revlimid and dexamethasone continued until 03/22/2020. 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 -Revlimid 10 mg daily started around first week of December 2021. - Revlimid dose decreased to 10 mg 3 weeks on 1 week off on 01/04/2021 due to worsening back pain.   2.  Upper back pain: -MRI on 07/13/2020 shows T7, T8, T12, L1 and L2 compression/superior endplate fractures.  Height loss is greatest and moderate at T7 and T8.  No evidence of bone lesions.   PLAN:  1.  IgA lambda plasma cell myeloma: - She does not report any new onset pains.  She is tolerating Revlimid very well. - We have sent myeloma panel from today which is pending.  Previous myeloma panel from 05/24/2021 showed normal SPEP and immunofixation. - Reviewed labs from today which shows white count 2.6 with ANC 1.3 from Revlimid.  Hemoglobin and platelet count was near normal. - She has mildly elevated AST and ALT which we will closely follow. - Continue Revlimid 10 mg 3 weeks on/1 week off indefinitely.  We talked about the risk factors including secondary malignancies mostly skin cancers. - I will follow-up with her in 3 months with repeat labs.   2.  Mid back pain: - She was previously evaluated by IR for vertebroplasty which was not recommended. - Mid back pain is stable in the last 2 months.  Gabapentin is helping.   3.  Bone protection: - We will continue monthly Zometa until March 2023.  And then we will switch her to every 3 months.   4.  ID prophylaxis: - Continue acyclovir and aspirin.   5.  Neuropathy: - Continue gabapentin once daily.  Helping her neuropathy and back pain.   6.  Hypomagnesemia: - Continue magnesium supplements.  Magnesium is normal today.   7.  Hypertension: - Continue HCTZ 25 mg daily.   8.  Posttransplant vaccination: - She does not require  any vaccination at this time.   Orders placed this encounter:  No orders of the defined types were placed in this encounter.    Derek Jack, MD [facility] [phone]   I, Thana Ates, am acting as a scribe for Dr. Derek Jack.  I, Derek Jack MD, have reviewed the above documentation for accuracy and completeness, and I agree with the above.

## 2021-07-19 ENCOUNTER — Other Ambulatory Visit: Payer: Self-pay

## 2021-07-19 ENCOUNTER — Inpatient Hospital Stay (HOSPITAL_COMMUNITY): Payer: Medicare HMO

## 2021-07-19 ENCOUNTER — Inpatient Hospital Stay (HOSPITAL_COMMUNITY): Payer: Medicare HMO | Attending: Hematology | Admitting: Hematology

## 2021-07-19 VITALS — BP 146/88 | HR 75 | Temp 98.3°F | Resp 18 | Wt 154.4 lb

## 2021-07-19 VITALS — BP 144/80 | HR 67 | Temp 97.5°F | Resp 18

## 2021-07-19 DIAGNOSIS — Z809 Family history of malignant neoplasm, unspecified: Secondary | ICD-10-CM | POA: Diagnosis not present

## 2021-07-19 DIAGNOSIS — Z8249 Family history of ischemic heart disease and other diseases of the circulatory system: Secondary | ICD-10-CM | POA: Insufficient documentation

## 2021-07-19 DIAGNOSIS — G629 Polyneuropathy, unspecified: Secondary | ICD-10-CM | POA: Diagnosis not present

## 2021-07-19 DIAGNOSIS — C9000 Multiple myeloma not having achieved remission: Secondary | ICD-10-CM

## 2021-07-19 DIAGNOSIS — Z800 Family history of malignant neoplasm of digestive organs: Secondary | ICD-10-CM | POA: Diagnosis not present

## 2021-07-19 DIAGNOSIS — Z8379 Family history of other diseases of the digestive system: Secondary | ICD-10-CM | POA: Insufficient documentation

## 2021-07-19 DIAGNOSIS — Z9012 Acquired absence of left breast and nipple: Secondary | ICD-10-CM | POA: Diagnosis not present

## 2021-07-19 DIAGNOSIS — Z833 Family history of diabetes mellitus: Secondary | ICD-10-CM | POA: Insufficient documentation

## 2021-07-19 DIAGNOSIS — Z79899 Other long term (current) drug therapy: Secondary | ICD-10-CM | POA: Diagnosis not present

## 2021-07-19 DIAGNOSIS — Z7961 Long term (current) use of immunomodulator: Secondary | ICD-10-CM | POA: Diagnosis not present

## 2021-07-19 DIAGNOSIS — K5900 Constipation, unspecified: Secondary | ICD-10-CM | POA: Diagnosis not present

## 2021-07-19 DIAGNOSIS — M546 Pain in thoracic spine: Secondary | ICD-10-CM | POA: Diagnosis not present

## 2021-07-19 DIAGNOSIS — R200 Anesthesia of skin: Secondary | ICD-10-CM | POA: Insufficient documentation

## 2021-07-19 DIAGNOSIS — Z818 Family history of other mental and behavioral disorders: Secondary | ICD-10-CM | POA: Diagnosis not present

## 2021-07-19 DIAGNOSIS — Z9484 Stem cells transplant status: Secondary | ICD-10-CM | POA: Insufficient documentation

## 2021-07-19 LAB — CBC WITH DIFFERENTIAL/PLATELET
Abs Immature Granulocytes: 0.01 10*3/uL (ref 0.00–0.07)
Basophils Absolute: 0 10*3/uL (ref 0.0–0.1)
Basophils Relative: 2 %
Eosinophils Absolute: 0.2 10*3/uL (ref 0.0–0.5)
Eosinophils Relative: 6 %
HCT: 34.9 % — ABNORMAL LOW (ref 36.0–46.0)
Hemoglobin: 11.8 g/dL — ABNORMAL LOW (ref 12.0–15.0)
Immature Granulocytes: 0 %
Lymphocytes Relative: 36 %
Lymphs Abs: 1 10*3/uL (ref 0.7–4.0)
MCH: 31.3 pg (ref 26.0–34.0)
MCHC: 33.8 g/dL (ref 30.0–36.0)
MCV: 92.6 fL (ref 80.0–100.0)
Monocytes Absolute: 0.2 10*3/uL (ref 0.1–1.0)
Monocytes Relative: 8 %
Neutro Abs: 1.3 10*3/uL — ABNORMAL LOW (ref 1.7–7.7)
Neutrophils Relative %: 48 %
Platelets: 176 10*3/uL (ref 150–400)
RBC: 3.77 MIL/uL — ABNORMAL LOW (ref 3.87–5.11)
RDW: 13.9 % (ref 11.5–15.5)
WBC: 2.6 10*3/uL — ABNORMAL LOW (ref 4.0–10.5)
nRBC: 0 % (ref 0.0–0.2)

## 2021-07-19 LAB — COMPREHENSIVE METABOLIC PANEL
ALT: 61 U/L — ABNORMAL HIGH (ref 0–44)
AST: 53 U/L — ABNORMAL HIGH (ref 15–41)
Albumin: 3.7 g/dL (ref 3.5–5.0)
Alkaline Phosphatase: 171 U/L — ABNORMAL HIGH (ref 38–126)
Anion gap: 10 (ref 5–15)
BUN: 18 mg/dL (ref 8–23)
CO2: 27 mmol/L (ref 22–32)
Calcium: 9.1 mg/dL (ref 8.9–10.3)
Chloride: 101 mmol/L (ref 98–111)
Creatinine, Ser: 0.77 mg/dL (ref 0.44–1.00)
GFR, Estimated: >60 mL/min (ref >60)
Glucose, Bld: 96 mg/dL (ref 70–99)
Potassium: 3.3 mmol/L — ABNORMAL LOW (ref 3.5–5.1)
Sodium: 138 mmol/L (ref 135–145)
Total Bilirubin: 0.4 mg/dL (ref 0.3–1.2)
Total Protein: 6.9 g/dL (ref 6.5–8.1)

## 2021-07-19 LAB — LACTATE DEHYDROGENASE: LDH: 122 U/L (ref 98–192)

## 2021-07-19 LAB — MAGNESIUM: Magnesium: 2 mg/dL (ref 1.7–2.4)

## 2021-07-19 MED ORDER — ZOLEDRONIC ACID 4 MG/100ML IV SOLN
4.0000 mg | Freq: Once | INTRAVENOUS | Status: AC
  Administered 2021-07-19: 4 mg via INTRAVENOUS
  Filled 2021-07-19: qty 100

## 2021-07-19 MED ORDER — SODIUM CHLORIDE 0.9 % IV SOLN: Freq: Once | INTRAVENOUS | Status: AC

## 2021-07-19 NOTE — Progress Notes (Signed)
Pt here for Zometa.  Calcium 9.1.  Creatinine 0.77.  No complaints to note today.  Okay for treatment per Dr Raliegh Ip.  Tolerated treatment without incidence today. AVS reviewed.  Vital signs stable prior to discharge.  Stable during and after treatment.  Discharged ins table condition ambulatory.

## 2021-07-19 NOTE — Patient Instructions (Signed)
[facility]  Discharge Instructions: Thank you for choosing [facility] to provide your oncology and hematology care.  If you have a lab appointment with the [facility], please come in thru the Main Entrance and check in at the main information desk.  Wear comfortable clothing and clothing appropriate for easy access to any Portacath or PICC line.   We strive to give you quality time with your provider. You may need to reschedule your appointment if you arrive late (15 or more minutes).  Arriving late affects you and other patients whose appointments are after yours.  Also, if you miss three or more appointments without notifying the office, you may be dismissed from the clinic at the provider's discretion.      For prescription refill requests, have your pharmacy contact our office and allow 72 hours for refills to be completed.    Today you received the following chemotherapy and/or immunotherapy agents zometa      To help prevent nausea and vomiting after your treatment, we encourage you to take your nausea medication as directed.  BELOW ARE SYMPTOMS THAT SHOULD BE REPORTED IMMEDIATELY: *FEVER GREATER THAN 100.4 F (38 C) OR HIGHER *CHILLS OR SWEATING *NAUSEA AND VOMITING THAT IS NOT CONTROLLED WITH YOUR NAUSEA MEDICATION *UNUSUAL SHORTNESS OF BREATH *UNUSUAL BRUISING OR BLEEDING *URINARY PROBLEMS (pain or burning when urinating, or frequent urination) *BOWEL PROBLEMS (unusual diarrhea, constipation, pain near the anus) TENDERNESS IN MOUTH AND THROAT WITH OR WITHOUT PRESENCE OF ULCERS (sore throat, sores in mouth, or a toothache) UNUSUAL RASH, SWELLING OR PAIN  UNUSUAL VAGINAL DISCHARGE OR ITCHING   Items with * indicate a potential emergency and should be followed up as soon as possible or go to the Emergency Department if any problems should occur.  Please show the CHEMOTHERAPY ALERT CARD or IMMUNOTHERAPY ALERT CARD at check-in to the Emergency  Department and triage nurse.  Should you have questions after your visit or need to cancel or reschedule your appointment, please contact [facility] [phone]  and follow the prompts.  Office hours are 8:00 a.m. to 4:30 p.m. Monday - Friday. Please note that voicemails left after 4:00 p.m. may not be returned until the following business day.  We are closed weekends and major holidays. You have access to a nurse at all times for urgent questions. Please call the main number to the clinic [phone] and follow the prompts.  For any non-urgent questions, you may also contact your provider using MyChart. We now offer e-Visits for anyone 14 and older to request care online for non-urgent symptoms. For details visit [email or web address].   Also download the MyChart app! Go to the app store, search "MyChart", open the app, select [facility], and log in with your MyChart username and password.  Due to Covid, a mask is required upon entering the hospital/clinic. If you do not have a mask, one will be given to you upon arrival. For doctor visits, patients may have 1 support person aged 30 or older with them. For treatment visits, patients cannot have anyone with them due to current Covid guidelines and our immunocompromised population.   Zoledronic Acid Injection (Hypercalcemia, Oncology) What is this medication? ZOLEDRONIC ACID (ZOE le dron ik AS id) slows calcium loss from bones. It high calcium levels in the blood from some kinds of cancer. It may be used in other people at risk for bone loss. This medicine may be used for other purposes; ask your health care  provider or pharmacist if you have questions. COMMON BRAND NAME(S): Zometa What should I tell my care team before I take this medication? They need to know if you have any of these conditions: cancer dehydration dental disease kidney disease liver disease low levels of calcium in the blood lung or breathing disease  (asthma) receiving steroids like dexamethasone or prednisone an unusual or allergic reaction to zoledronic acid, other medicines, foods, dyes, or preservatives pregnant or trying to get pregnant breast-feeding How should I use this medication? This drug is injected into a vein. It is given by a health care provider in a hospital or clinic setting. Talk to your health care provider about the use of this drug in children. Special care may be needed. Overdosage: If you think you have taken too much of this medicine contact a poison control center or emergency room at once. NOTE: This medicine is only for you. Do not share this medicine with others. What if I miss a dose? Keep appointments for follow-up doses. It is important not to miss your dose. Call your health care provider if you are unable to keep an appointment. What may interact with this medication? certain antibiotics given by injection NSAIDs, medicines for pain and inflammation, like ibuprofen or naproxen some diuretics like bumetanide, furosemide teriparatide thalidomide This list may not describe all possible interactions. Give your health care provider a list of all the medicines, herbs, non-prescription drugs, or dietary supplements you use. Also tell them if you smoke, drink alcohol, or use illegal drugs. Some items may interact with your medicine. What should I watch for while using this medication? Visit your health care provider for regular checks on your progress. It may be some time before you see the benefit from this drug. Some people who take this drug have severe bone, joint, or muscle pain. This drug may also increase your risk for jaw problems or a broken thigh bone. Tell your health care provider right away if you have severe pain in your jaw, bones, joints, or muscles. Tell you health care provider if you have any pain that does not go away or that gets worse. Tell your dentist and dental surgeon that you are taking  this drug. You should not have major dental surgery while on this drug. See your dentist to have a dental exam and fix any dental problems before starting this drug. Take good care of your teeth while on this drug. Make sure you see your dentist for regular follow-up appointments. You should make sure you get enough calcium and vitamin D while you are taking this drug. Discuss the foods you eat and the vitamins you take with your health care provider. Check with your health care provider if you have severe diarrhea, nausea, and vomiting, or if you sweat a lot. The loss of too much body fluid may make it dangerous for you to take this drug. You may need blood work done while you are taking this drug. Do not become pregnant while taking this drug. Women should inform their health care provider if they wish to become pregnant or think they might be pregnant. There is potential for serious harm to an unborn child. Talk to your health care provider for more information. What side effects may I notice from receiving this medication? Side effects that you should report to your doctor or health care provider as soon as possible: allergic reactions (skin rash, itching or hives; swelling of the face, lips, or tongue) bone pain  infection (fever, chills, cough, sore throat, pain or trouble passing urine) jaw pain, especially after dental work joint pain kidney injury (trouble passing urine or change in the amount of urine) low blood pressure (dizziness; feeling faint or lightheaded, falls; unusually weak or tired) low calcium levels (fast heartbeat; muscle cramps or pain; pain, tingling, or numbness in the hands or feet; seizures) low magnesium levels (fast, irregular heartbeat; muscle cramp or pain; muscle weakness; tremors; seizures) low red blood cell counts (trouble breathing; feeling faint; lightheaded, falls; unusually weak or tired) muscle pain redness, blistering, peeling, or loosening of the skin,  including inside the mouth severe diarrhea swelling of the ankles, feet, hands trouble breathing Side effects that usually do not require medical attention (report to your doctor or health care provider if they continue or are bothersome): anxious constipation coughing depressed mood eye irritation, itching, or pain fever general ill feeling or flu-like symptoms nausea pain, redness, or irritation at site where injected trouble sleeping This list may not describe all possible side effects. Call your doctor for medical advice about side effects. You may report side effects to FDA at 1-800-FDA-1088. Where should I keep my medication? This drug is given in a hospital or clinic. It will not be stored at home. NOTE: This sheet is a summary. It may not cover all possible information. If you have questions about this medicine, talk to your doctor, pharmacist, or health care provider.  2022 Elsevier/Gold Standard (2021-05-16 00:00:00)

## 2021-07-19 NOTE — Progress Notes (Signed)
Patient is taking Revlimid as prescribed.  She has not missed any doses and reports no side effects at this time.   

## 2021-07-19 NOTE — Patient Instructions (Signed)
[facility] at [facility] Discharge Instructions  You were seen and examined today by Dr. Delton Coombes. You will receive your Zometa infusion today. Continue taking Revlimid as prescribed. Return as scheduled in 4 weeks for labs and Zometa. Return as scheduled for office visit.    Thank you for choosing [facility] at [facility] to provide your oncology and hematology care.  To afford each patient quality time with our provider, please arrive at least 15 minutes before your scheduled appointment time.   If you have a lab appointment with the [facility] please come in thru the Main Entrance and check in at the main information desk.  You need to re-schedule your appointment should you arrive 10 or more minutes late.  We strive to give you quality time with our providers, and arriving late affects you and other patients whose appointments are after yours.  Also, if you no show three or more times for appointments you may be dismissed from the clinic at the providers discretion.     Again, thank you for choosing [facility].  Our hope is that these requests will decrease the amount of time that you wait before being seen by our physicians.       _____________________________________________________________  Should you have questions after your visit to [facility], please contact our office at [phone] and follow the prompts.  Our office hours are 8:00 a.m. and 4:30 p.m. Monday - Friday.  Please note that voicemails left after 4:00 p.m. may not be returned until the following business day.  We are closed weekends and major holidays.  You do have access to a nurse 24-7, just call the main number to the clinic [phone] and do not press any options, hold on the line and a nurse will answer the phone.    For prescription refill requests, have your pharmacy contact our office and allow 72 hours.    Due to Covid, you will  need to wear a mask upon entering the hospital. If you do not have a mask, a mask will be given to you at the Main Entrance upon arrival. For doctor visits, patients may have 1 support person age 4 or older with them. For treatment visits, patients can not have anyone with them due to social distancing guidelines and our immunocompromised population.

## 2021-07-20 LAB — KAPPA/LAMBDA LIGHT CHAINS
Kappa free light chain: 30.1 mg/L — ABNORMAL HIGH (ref 3.3–19.4)
Kappa, lambda light chain ratio: 1.7 — ABNORMAL HIGH (ref 0.26–1.65)
Lambda free light chains: 17.7 mg/L (ref 5.7–26.3)

## 2021-07-21 LAB — IMMUNOFIXATION ELECTROPHORESIS
IgA: 42 mg/dL — ABNORMAL LOW (ref 87–352)
IgG (Immunoglobin G), Serum: 1057 mg/dL (ref 586–1602)
IgM (Immunoglobulin M), Srm: 36 mg/dL (ref 26–217)
Total Protein ELP: 6.6 g/dL (ref 6.0–8.5)

## 2021-07-23 ENCOUNTER — Other Ambulatory Visit (HOSPITAL_COMMUNITY): Payer: Self-pay | Admitting: Hematology

## 2021-07-23 DIAGNOSIS — C9001 Multiple myeloma in remission: Secondary | ICD-10-CM

## 2021-07-23 LAB — PROTEIN ELECTROPHORESIS, SERUM
A/G Ratio: 1.1 (ref 0.7–1.7)
Albumin ELP: 3.5 g/dL (ref 2.9–4.4)
Alpha-1-Globulin: 0.3 g/dL (ref 0.0–0.4)
Alpha-2-Globulin: 0.8 g/dL (ref 0.4–1.0)
Beta Globulin: 1 g/dL (ref 0.7–1.3)
Gamma Globulin: 1 g/dL (ref 0.4–1.8)
Globulin, Total: 3.1 g/dL (ref 2.2–3.9)
M-Spike, %: 0.3 g/dL — ABNORMAL HIGH
Total Protein ELP: 6.6 g/dL (ref 6.0–8.5)

## 2021-07-31 ENCOUNTER — Encounter (HOSPITAL_COMMUNITY): Payer: Self-pay | Admitting: Hematology

## 2021-07-31 ENCOUNTER — Other Ambulatory Visit (HOSPITAL_COMMUNITY): Payer: Self-pay

## 2021-07-31 DIAGNOSIS — C9001 Multiple myeloma in remission: Secondary | ICD-10-CM

## 2021-07-31 MED ORDER — LENALIDOMIDE 10 MG PO CAPS: ORAL_CAPSULE | ORAL | 0 refills | Status: DC

## 2021-07-31 NOTE — Telephone Encounter (Signed)
Chart reviewed. Revlimid refilled per last office note with Dr. Katragadda.  

## 2021-08-01 ENCOUNTER — Telehealth (HOSPITAL_COMMUNITY): Payer: Self-pay | Admitting: Pharmacy Technician

## 2021-08-01 NOTE — Telephone Encounter (Signed)
Oral Oncology Patient Advocate Encounter  Patient dropped off her completed form for [place] in an effort to reduce patient's out of pocket expense for Revlimid to $0.    Application completed and faxed to [phone].   [facility] Access Support phone number for follow up is [phone].  This encounter will be updated until final determination.  [facility] Patient [place] Phone [phone] Fax [phone] 08/01/2021 4:06 PM

## 2021-08-07 NOTE — Telephone Encounter (Signed)
Received notification that patient has been referred to [facility].    I will continue to follow up with [facility] until final determination.  [place] [facility] [place] Phone [phone] Fax [phone] 08/07/2021 10:42 AM

## 2021-08-08 NOTE — Telephone Encounter (Signed)
Oral Oncology Patient Advocate Encounter  Received notification from [place] [facility] that patient has been successfully enrolled into their program to receive Revlimid from the manufacturer at $0 out of pocket until 09/09/21.    I called and spoke with patient.  She knows we will have to re-apply.   Specialty Pharmacy that will dispense medication is [facility].  Patient knows to call the office with questions or concerns.   Oral Oncology Clinic will continue to follow.  [place] Patient [place] Phone [phone] Fax [phone] 08/08/2021 9:55 AM

## 2021-08-10 ENCOUNTER — Other Ambulatory Visit (HOSPITAL_COMMUNITY): Payer: Self-pay

## 2021-08-10 DIAGNOSIS — C9001 Multiple myeloma in remission: Secondary | ICD-10-CM

## 2021-08-10 MED ORDER — LENALIDOMIDE 10 MG PO CAPS: ORAL_CAPSULE | ORAL | 0 refills | Status: DC

## 2021-08-16 ENCOUNTER — Inpatient Hospital Stay (HOSPITAL_COMMUNITY): Payer: Medicare HMO | Attending: Hematology

## 2021-08-16 ENCOUNTER — Telehealth (HOSPITAL_COMMUNITY): Payer: Self-pay | Admitting: Hematology

## 2021-08-16 ENCOUNTER — Inpatient Hospital Stay (HOSPITAL_COMMUNITY): Payer: Medicare HMO

## 2021-08-16 ENCOUNTER — Telehealth (HOSPITAL_COMMUNITY): Payer: Self-pay | Admitting: Pharmacy Technician

## 2021-08-16 ENCOUNTER — Encounter (HOSPITAL_COMMUNITY): Payer: Self-pay

## 2021-08-16 ENCOUNTER — Other Ambulatory Visit: Payer: Self-pay

## 2021-08-16 VITALS — BP 141/72 | HR 80 | Temp 98.3°F | Resp 18 | Ht 66.0 in | Wt 152.4 lb

## 2021-08-16 DIAGNOSIS — C9000 Multiple myeloma not having achieved remission: Secondary | ICD-10-CM

## 2021-08-16 LAB — BASIC METABOLIC PANEL
Anion gap: 10 (ref 5–15)
BUN: 16 mg/dL (ref 8–23)
CO2: 29 mmol/L (ref 22–32)
Calcium: 9 mg/dL (ref 8.9–10.3)
Chloride: 98 mmol/L (ref 98–111)
Creatinine, Ser: 0.81 mg/dL (ref 0.44–1.00)
GFR, Estimated: >60 mL/min (ref >60)
Glucose, Bld: 107 mg/dL — ABNORMAL HIGH (ref 70–99)
Potassium: 3.2 mmol/L — ABNORMAL LOW (ref 3.5–5.1)
Sodium: 137 mmol/L (ref 135–145)

## 2021-08-16 MED ORDER — ZOLEDRONIC ACID 4 MG/100ML IV SOLN
4.0000 mg | Freq: Once | INTRAVENOUS | Status: AC
  Administered 2021-08-16: 4 mg via INTRAVENOUS
  Filled 2021-08-16: qty 100

## 2021-08-16 MED ORDER — SODIUM CHLORIDE 0.9 % IV SOLN
Freq: Once | INTRAVENOUS | Status: AC
Start: 2021-08-16 — End: 2021-08-16

## 2021-08-16 NOTE — Progress Notes (Signed)
Tolerated Zometa infusion without incidence today.  AVS reviewed.  Vital signs stable prior to discharge.  Discharged in stable condition ambulatory.

## 2021-08-16 NOTE — Patient Instructions (Signed)
[place]  Discharge Instructions: Thank you for choosing [facility] to provide your oncology and hematology care.  If you have a lab appointment with the [facility], please come in thru the Main Entrance and check in at the main information desk.  Wear comfortable clothing and clothing appropriate for easy access to any Portacath or PICC line.   We strive to give you quality time with your provider. You may need to reschedule your appointment if you arrive late (15 or more minutes).  Arriving late affects you and other patients whose appointments are after yours.  Also, if you miss three or more appointments without notifying the office, you may be dismissed from the clinic at the provider's discretion.      For prescription refill requests, have your pharmacy contact our office and allow 72 hours for refills to be completed.    Today you received the following chemotherapy and/or immunotherapy agents zometa      To help prevent nausea and vomiting after your treatment, we encourage you to take your nausea medication as directed.  BELOW ARE SYMPTOMS THAT SHOULD BE REPORTED IMMEDIATELY: *FEVER GREATER THAN 100.4 F (38 C) OR HIGHER *CHILLS OR SWEATING *NAUSEA AND VOMITING THAT IS NOT CONTROLLED WITH YOUR NAUSEA MEDICATION *UNUSUAL SHORTNESS OF BREATH *UNUSUAL BRUISING OR BLEEDING *URINARY PROBLEMS (pain or burning when urinating, or frequent urination) *BOWEL PROBLEMS (unusual diarrhea, constipation, pain near the anus) TENDERNESS IN MOUTH AND THROAT WITH OR WITHOUT PRESENCE OF ULCERS (sore throat, sores in mouth, or a toothache) UNUSUAL RASH, SWELLING OR PAIN  UNUSUAL VAGINAL DISCHARGE OR ITCHING   Items with * indicate a potential emergency and should be followed up as soon as possible or go to the Emergency Department if any problems should occur.  Please show the CHEMOTHERAPY ALERT CARD or IMMUNOTHERAPY ALERT CARD at check-in to the Emergency  Department and triage nurse.  Should you have questions after your visit or need to cancel or reschedule your appointment, please contact [facility] [phone]  and follow the prompts.  Office hours are 8:00 a.m. to 4:30 p.m. Monday - Friday. Please note that voicemails left after 4:00 p.m. may not be returned until the following business day.  We are closed weekends and major holidays. You have access to a nurse at all times for urgent questions. Please call the main number to the clinic [phone] and follow the prompts.  For any non-urgent questions, you may also contact your provider using MyChart. We now offer e-Visits for anyone 31 and older to request care online for non-urgent symptoms. For details visit [email or web address].   Also download the MyChart app! Go to the app store, search "MyChart", open the app, select [facility], and log in with your MyChart username and password.  Due to Covid, a mask is required upon entering the hospital/clinic. If you do not have a mask, one will be given to you upon arrival. For doctor visits, patients may have 1 support person aged 42 or older with them. For treatment visits, patients cannot have anyone with them due to current Covid guidelines and our immunocompromised population.

## 2021-08-17 ENCOUNTER — Encounter (HOSPITAL_COMMUNITY): Payer: Self-pay | Admitting: Hematology

## 2021-08-17 NOTE — Telephone Encounter (Signed)
Oral Oncology Patient Advocate Encounter   Was successful in securing patient a $12,000 grant from [facility] (PAF) to provide copayment coverage for Revlimid.  This will keep the out of pocket expense at $0.     I have spoken with the patient.    The billing information is as follows and will be shared with [name].   RxBin: [id number] PCN:  [id number] Member ID: [id number] Group ID: [id number] Dates of Eligibility: 02/12/21 through 08/11/22  Fund:  [place] Patient Stephanie [place] Phone [phone] Fax [phone] 08/17/2021 1:31 PM

## 2021-09-06 ENCOUNTER — Other Ambulatory Visit (HOSPITAL_COMMUNITY): Payer: Self-pay

## 2021-09-06 DIAGNOSIS — C9001 Multiple myeloma in remission: Secondary | ICD-10-CM

## 2021-09-06 MED ORDER — LENALIDOMIDE 10 MG PO CAPS: ORAL_CAPSULE | ORAL | 0 refills | Status: DC

## 2021-09-06 NOTE — Telephone Encounter (Signed)
Chart reviewed. Revlimid refilled per last office note with Dr. Katragadda.  

## 2021-09-12 ENCOUNTER — Other Ambulatory Visit (HOSPITAL_COMMUNITY): Payer: Self-pay

## 2021-09-12 DIAGNOSIS — C9001 Multiple myeloma in remission: Secondary | ICD-10-CM

## 2021-09-12 MED ORDER — LENALIDOMIDE 10 MG PO CAPS: ORAL_CAPSULE | ORAL | 0 refills | Status: DC

## 2021-09-13 ENCOUNTER — Inpatient Hospital Stay (HOSPITAL_COMMUNITY): Payer: Medicare HMO

## 2021-09-13 ENCOUNTER — Encounter (HOSPITAL_COMMUNITY): Payer: Self-pay

## 2021-09-13 ENCOUNTER — Other Ambulatory Visit: Payer: Self-pay

## 2021-09-13 ENCOUNTER — Inpatient Hospital Stay (HOSPITAL_COMMUNITY): Payer: Medicare HMO | Attending: Hematology

## 2021-09-13 VITALS — BP 153/72 | HR 72 | Temp 97.5°F | Resp 18 | Ht 66.0 in | Wt 156.8 lb

## 2021-09-13 DIAGNOSIS — C9000 Multiple myeloma not having achieved remission: Secondary | ICD-10-CM | POA: Insufficient documentation

## 2021-09-13 DIAGNOSIS — Z79899 Other long term (current) drug therapy: Secondary | ICD-10-CM | POA: Diagnosis not present

## 2021-09-13 LAB — BASIC METABOLIC PANEL
Anion gap: 7 (ref 5–15)
BUN: 23 mg/dL (ref 8–23)
CO2: 32 mmol/L (ref 22–32)
Calcium: 9.2 mg/dL (ref 8.9–10.3)
Chloride: 101 mmol/L (ref 98–111)
Creatinine, Ser: 0.86 mg/dL (ref 0.44–1.00)
GFR, Estimated: >60 mL/min (ref >60)
Glucose, Bld: 84 mg/dL (ref 70–99)
Potassium: 3 mmol/L — ABNORMAL LOW (ref 3.5–5.1)
Sodium: 140 mmol/L (ref 135–145)

## 2021-09-13 MED ORDER — ZOLEDRONIC ACID 4 MG/100ML IV SOLN
4.0000 mg | Freq: Once | INTRAVENOUS | Status: AC
  Administered 2021-09-13: 4 mg via INTRAVENOUS
  Filled 2021-09-13: qty 100

## 2021-09-13 MED ORDER — SODIUM CHLORIDE 0.9 % IV SOLN: Freq: Once | INTRAVENOUS | Status: AC

## 2021-09-13 NOTE — Patient Instructions (Signed)
[place] [facility]  Discharge Instructions: Thank you for choosing [facility] to provide your oncology and hematology care.  If you have a lab appointment with the [facility], please come in thru the Main Entrance and check in at the main information desk.  Wear comfortable clothing and clothing appropriate for easy access to any Portacath or PICC line.   We strive to give you quality time with your provider. You may need to reschedule your appointment if you arrive late (15 or more minutes).  Arriving late affects you and other patients whose appointments are after yours.  Also, if you miss three or more appointments without notifying the office, you may be dismissed from the clinic at the providers discretion.      For prescription refill requests, have your pharmacy contact our office and allow 72 hours for refills to be completed.    Today you received the following : Zometa .      To help prevent nausea and vomiting after your treatment, we encourage you to take your nausea medication as directed.  BELOW ARE SYMPTOMS THAT SHOULD BE REPORTED IMMEDIATELY: *FEVER GREATER THAN 100.4 F (38 C) OR HIGHER *CHILLS OR SWEATING *NAUSEA AND VOMITING THAT IS NOT CONTROLLED WITH YOUR NAUSEA MEDICATION *UNUSUAL SHORTNESS OF BREATH *UNUSUAL BRUISING OR BLEEDING *URINARY PROBLEMS (pain or burning when urinating, or frequent urination) *BOWEL PROBLEMS (unusual diarrhea, constipation, pain near the anus) TENDERNESS IN MOUTH AND THROAT WITH OR WITHOUT PRESENCE OF ULCERS (sore throat, sores in mouth, or a toothache) UNUSUAL RASH, SWELLING OR PAIN  UNUSUAL VAGINAL DISCHARGE OR ITCHING   Items with * indicate a potential emergency and should be followed up as soon as possible or go to the Emergency Department if any problems should occur.  Please show the CHEMOTHERAPY ALERT CARD or IMMUNOTHERAPY ALERT CARD at check-in to the Emergency Department and triage nurse.  Should you  have questions after your visit or need to cancel or reschedule your appointment, please contact [facility] [phone]  and follow the prompts.  Office hours are 8:00 a.m. to 4:30 p.m. Monday - Friday. Please note that voicemails left after 4:00 p.m. may not be returned until the following business day.  We are closed weekends and major holidays. You have access to a nurse at all times for urgent questions. Please call the main number to the clinic [phone] and follow the prompts.  For any non-urgent questions, you may also contact your provider using MyChart. We now offer e-Visits for anyone 16 and older to request care online for non-urgent symptoms. For details visit [email or web address].   Also download the MyChart app! Go to the app store, search "MyChart", open the app, select [facility], and log in with your MyChart username and password.  Due to Covid, a mask is required upon entering the hospital/clinic. If you do not have a mask, one will be given to you upon arrival. For doctor visits, patients may have 1 support person aged 67 or older with them. For treatment visits, patients cannot have anyone with them due to current Covid guidelines and our immunocompromised population.

## 2021-09-13 NOTE — Progress Notes (Signed)
Patient presents today for Zometa infusion. Vital signs stable. Patient denies any significant changes since her last visit. Patient states she is taking her Potassium and Magnesium as prescribed at home. Potassium today 3.0. Patient states she is taking Revlimid as prescribed. [facility] is now taking care of her Revlimid script per [facility] RN/ Nurse Navigator. Information given to patient and contact number updated for pharmacy and given to patient. Understanding verbalized.   Zometa given today per MD orders. Tolerated infusion without adverse affects. Vital signs stable. No complaints at this time. Discharged from clinic ambulatory in stable condition. Alert and oriented x 3. F/U with [facility] as scheduled.

## 2021-10-01 ENCOUNTER — Other Ambulatory Visit (HOSPITAL_COMMUNITY): Payer: Self-pay | Admitting: Hematology

## 2021-10-01 DIAGNOSIS — C9001 Multiple myeloma in remission: Secondary | ICD-10-CM

## 2021-10-05 ENCOUNTER — Encounter (HOSPITAL_COMMUNITY): Payer: Self-pay | Admitting: Hematology

## 2021-10-05 NOTE — Telephone Encounter (Signed)
Chart reviewed. Revlimid refilled per last office note with Dr. Katragadda.  

## 2021-10-11 ENCOUNTER — Other Ambulatory Visit: Payer: Self-pay

## 2021-10-11 ENCOUNTER — Inpatient Hospital Stay (HOSPITAL_COMMUNITY): Payer: Medicare HMO | Admitting: Hematology

## 2021-10-11 ENCOUNTER — Inpatient Hospital Stay (HOSPITAL_COMMUNITY): Payer: Medicare HMO | Attending: Hematology

## 2021-10-11 ENCOUNTER — Inpatient Hospital Stay (HOSPITAL_COMMUNITY): Payer: Medicare HMO

## 2021-10-11 VITALS — BP 170/83 | HR 71 | Temp 97.2°F | Resp 17 | Ht 66.0 in | Wt 156.4 lb

## 2021-10-11 DIAGNOSIS — C9000 Multiple myeloma not having achieved remission: Secondary | ICD-10-CM | POA: Insufficient documentation

## 2021-10-11 DIAGNOSIS — M25512 Pain in left shoulder: Secondary | ICD-10-CM | POA: Diagnosis not present

## 2021-10-11 DIAGNOSIS — Z8249 Family history of ischemic heart disease and other diseases of the circulatory system: Secondary | ICD-10-CM | POA: Diagnosis not present

## 2021-10-11 DIAGNOSIS — Z7961 Long term (current) use of immunomodulator: Secondary | ICD-10-CM | POA: Insufficient documentation

## 2021-10-11 DIAGNOSIS — M25511 Pain in right shoulder: Secondary | ICD-10-CM | POA: Diagnosis not present

## 2021-10-11 DIAGNOSIS — Z853 Personal history of malignant neoplasm of breast: Secondary | ICD-10-CM | POA: Insufficient documentation

## 2021-10-11 DIAGNOSIS — Z79899 Other long term (current) drug therapy: Secondary | ICD-10-CM | POA: Diagnosis not present

## 2021-10-11 DIAGNOSIS — Z800 Family history of malignant neoplasm of digestive organs: Secondary | ICD-10-CM | POA: Insufficient documentation

## 2021-10-11 DIAGNOSIS — Z23 Encounter for immunization: Secondary | ICD-10-CM | POA: Diagnosis not present

## 2021-10-11 DIAGNOSIS — G629 Polyneuropathy, unspecified: Secondary | ICD-10-CM | POA: Diagnosis not present

## 2021-10-11 DIAGNOSIS — M546 Pain in thoracic spine: Secondary | ICD-10-CM | POA: Insufficient documentation

## 2021-10-11 DIAGNOSIS — M5489 Other dorsalgia: Secondary | ICD-10-CM | POA: Diagnosis not present

## 2021-10-11 DIAGNOSIS — Z818 Family history of other mental and behavioral disorders: Secondary | ICD-10-CM | POA: Diagnosis not present

## 2021-10-11 DIAGNOSIS — R519 Headache, unspecified: Secondary | ICD-10-CM | POA: Diagnosis not present

## 2021-10-11 DIAGNOSIS — Z833 Family history of diabetes mellitus: Secondary | ICD-10-CM | POA: Insufficient documentation

## 2021-10-11 DIAGNOSIS — G479 Sleep disorder, unspecified: Secondary | ICD-10-CM | POA: Insufficient documentation

## 2021-10-11 DIAGNOSIS — Z8379 Family history of other diseases of the digestive system: Secondary | ICD-10-CM | POA: Diagnosis not present

## 2021-10-11 DIAGNOSIS — M2550 Pain in unspecified joint: Secondary | ICD-10-CM | POA: Insufficient documentation

## 2021-10-11 DIAGNOSIS — Z809 Family history of malignant neoplasm, unspecified: Secondary | ICD-10-CM | POA: Diagnosis not present

## 2021-10-11 DIAGNOSIS — Z9484 Stem cells transplant status: Secondary | ICD-10-CM | POA: Insufficient documentation

## 2021-10-11 LAB — CBC WITH DIFFERENTIAL/PLATELET
Abs Immature Granulocytes: 0.01 10*3/uL (ref 0.00–0.07)
Basophils Absolute: 0 10*3/uL (ref 0.0–0.1)
Basophils Relative: 1 %
Eosinophils Absolute: 0.1 10*3/uL (ref 0.0–0.5)
Eosinophils Relative: 3 %
HCT: 37.6 % (ref 36.0–46.0)
Hemoglobin: 12.4 g/dL (ref 12.0–15.0)
Immature Granulocytes: 0 %
Lymphocytes Relative: 31 %
Lymphs Abs: 0.9 10*3/uL (ref 0.7–4.0)
MCH: 31.6 pg (ref 26.0–34.0)
MCHC: 33 g/dL (ref 30.0–36.0)
MCV: 95.7 fL (ref 80.0–100.0)
Monocytes Absolute: 0.2 10*3/uL (ref 0.1–1.0)
Monocytes Relative: 6 %
Neutro Abs: 1.7 10*3/uL (ref 1.7–7.7)
Neutrophils Relative %: 59 %
Platelets: 188 10*3/uL (ref 150–400)
RBC: 3.93 MIL/uL (ref 3.87–5.11)
RDW: 14.4 % (ref 11.5–15.5)
WBC: 3 10*3/uL — ABNORMAL LOW (ref 4.0–10.5)
nRBC: 0 % (ref 0.0–0.2)

## 2021-10-11 LAB — COMPREHENSIVE METABOLIC PANEL
ALT: 33 U/L (ref 0–44)
AST: 23 U/L (ref 15–41)
Albumin: 3.9 g/dL (ref 3.5–5.0)
Alkaline Phosphatase: 138 U/L — ABNORMAL HIGH (ref 38–126)
Anion gap: 4 — ABNORMAL LOW (ref 5–15)
BUN: 22 mg/dL (ref 8–23)
CO2: 31 mmol/L (ref 22–32)
Calcium: 8.9 mg/dL (ref 8.9–10.3)
Chloride: 100 mmol/L (ref 98–111)
Creatinine, Ser: 0.82 mg/dL (ref 0.44–1.00)
GFR, Estimated: >60 mL/min (ref >60)
Glucose, Bld: 94 mg/dL (ref 70–99)
Potassium: 3.2 mmol/L — ABNORMAL LOW (ref 3.5–5.1)
Sodium: 135 mmol/L (ref 135–145)
Total Bilirubin: 0.4 mg/dL (ref 0.3–1.2)
Total Protein: 7.6 g/dL (ref 6.5–8.1)

## 2021-10-11 LAB — MAGNESIUM: Magnesium: 1.9 mg/dL (ref 1.7–2.4)

## 2021-10-11 LAB — LACTATE DEHYDROGENASE: LDH: 117 U/L (ref 98–192)

## 2021-10-11 MED ORDER — ZOLEDRONIC ACID 4 MG/100ML IV SOLN
4.0000 mg | Freq: Once | INTRAVENOUS | Status: AC
  Administered 2021-10-11: 4 mg via INTRAVENOUS
  Filled 2021-10-11: qty 100

## 2021-10-11 MED ORDER — SODIUM CHLORIDE 0.9 % IV SOLN: Freq: Once | INTRAVENOUS | Status: AC

## 2021-10-11 NOTE — Progress Notes (Signed)
Stephanie Galvan, [place] [address]   CLINIC:  Medical Oncology/Hematology  PCP:  [name], MD [place] / [place] [address] [phone]   REASON FOR VISIT:  Follow-up for multiple myeloma  PRIOR THERAPY:  1. RVD x 6 cycles from 08/19/2019 to 12/15/2019. 2. 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  NGS Results: not done  CURRENT THERAPY: Revlimid 10 mg daily; Zometa weekly  BRIEF ONCOLOGIC HISTORY:  Oncology History  Multiple myeloma ([place])  08/22/2019 Initial Diagnosis   Multiple myeloma ([place])   08/22/2019 Cancer Staging   Staging form: Plasma Cell Myeloma and Plasma Cell Disorders, AJCC 8th Edition - Clinical stage from 08/22/2019: RISS Stage II (Beta-2-microglobulin (mg/L): 3.8, Albumin (g/dL): 3.2, ISS: Stage II, High-risk cytogenetics: Absent, LDH: Normal) - Signed by Derek Jack, MD on 08/22/2019      CANCER STAGING:  Cancer Staging  Multiple myeloma ([place]) Staging form: Plasma Cell Myeloma and Plasma Cell Disorders, AJCC 8th Edition - Clinical stage from 08/22/2019: RISS Stage II (Beta-2-microglobulin (mg/L): 3.8, Albumin (g/dL): 3.2, ISS: Stage II, High-risk cytogenetics: Absent, LDH: Normal) - Signed by Derek Jack, MD on 08/22/2019   INTERVAL HISTORY:  Stephanie Galvan, a 66 y.o. female, returns for routine follow-up of her multiple myeloma. Yezenia was last seen on 07/19/2021.   Today she reports feeling good. She reports pain in her shoulders bilaterally which is worst on the left and worsens when raising her arms; she reports this pain appears to be improving slightly. Her back pain is improving.   REVIEW OF SYSTEMS:  Review of Systems  Constitutional:  Negative for appetite change and fatigue.  Musculoskeletal:  Positive for arthralgias (4/10 shoulder) and back pain (improving).  Neurological:  Positive for headaches.  Psychiatric/Behavioral:  Positive for sleep disturbance.   All other  systems reviewed and are negative.  PAST MEDICAL/SURGICAL HISTORY:  Past Medical History:  Diagnosis Date   Breast cancer ([place]) 2006   left breast   Breast mass, right    Compression fracture of cervical spine ([place]) 06/18/2019   recent Chest xray stating had fx.   High cholesterol    History of bladder infections    Hypertension    Rash Aug 2013   Past Surgical History:  Procedure Laterality Date   ABDOMINAL HYSTERECTOMY     just uterus removed   BREAST BIOPSY     BREAST LUMPECTOMY WITH RADIOACTIVE SEED LOCALIZATION Right 07/24/2018   Procedure: RIGHT BREAST LUMPECTOMY WITH RADIOACTIVE SEED LOCALIZATION;  Surgeon: Coralie Keens, MD;  Location: [facility];  Service: General;  Laterality: Right;   IR RADIOLOGIST EVAL & MGMT  07/28/2020   MASTECTOMY  5/06   left   PARTIAL HYSTERECTOMY     PORT-A-CATH REMOVAL     PORTACATH PLACEMENT      SOCIAL HISTORY:  Social History   Socioeconomic History   Marital status: Married    Spouse name: Not on file   Number of children: 2   Years of education: Not on file   Highest education level: Not on file  Occupational History    Employer: [employer]  Tobacco Use   Smoking status: Never   Smokeless tobacco: Never  Vaping Use   Vaping Use: Never used  Substance and Sexual Activity   Alcohol use: No   Drug use: No   Sexual activity: Yes    Birth control/protection: Surgical  Other Topics Concern   Not on file  Social History Narrative  Not on file   Social Determinants of Health   Financial Resource Strain: Not on file  Food Insecurity: Not on file  Transportation Needs: Not on file  Physical Activity: Not on file  Stress: Not on file  Social Connections: Not on file  Intimate Partner Violence: Not on file    FAMILY HISTORY:  Family History  Problem Relation Age of Onset   Dementia Mother    Stomach cancer Father    Cancer Brother    Cancer Brother    Diabetes Brother    Colitis Daughter     Hypertension Daughter    Hypertension Daughter     CURRENT MEDICATIONS:  Current Outpatient Medications  Medication Sig Dispense Refill   aspirin EC 81 MG tablet Take 81 mg by mouth daily. Swallow whole.     calcium carbonate (TUMS - DOSED IN MG ELEMENTAL CALCIUM) 500 MG chewable tablet Chew 2 tablets by mouth 2 (two) times daily.     furosemide (LASIX) 20 MG tablet Take 1 tablet (20 mg total) by mouth daily as needed for fluid. 30 tablet 6   gabapentin (NEURONTIN) 300 MG capsule Take 1 capsule (300 mg total) by mouth daily. 30 capsule 3   hydrochlorothiazide (HYDRODIURIL) 25 MG tablet Take 25 mg by mouth daily.     HYDROcodone-acetaminophen (NORCO) 10-325 MG tablet Take 1 tablet by mouth 2 (two) times daily as needed for moderate pain. 45 tablet 0   lenalidomide (REVLIMID) 10 MG capsule TAKE 1 CAPSULE BY MOUTH EVERY DAY FOR 3 WEEKS ON THEN 1 WEEK OFF FOR A 28 DAY SUPPLY 21 capsule 0   magnesium oxide (MAG-OX) 400 (241.3 Mg) MG tablet Take 1 tablet (400 mg total) by mouth daily. 60 tablet 3   magnesium oxide (MAG-OX) 400 MG tablet Take 1 tablet by mouth daily.     potassium chloride (KLOR-CON) 10 MEQ tablet Take 1 tablet (10 mEq total) by mouth 2 (two) times daily. 180 tablet 3   prochlorperazine (COMPAZINE) 10 MG tablet Take 1 tablet (10 mg total) by mouth every 6 (six) hours as needed for nausea or vomiting. 45 tablet 2   Vitamin D, Ergocalciferol, (DRISDOL) 1.25 MG (50000 UNIT) CAPS capsule TAKE 1 CAPSULE BY MOUTH ONE TIME PER WEEK 12 capsule 1   No current facility-administered medications for this visit.    ALLERGIES:  No Known Allergies  PHYSICAL EXAM:  Performance status (ECOG): 1 - Symptomatic but completely ambulatory  Vitals:   10/11/21 1300  BP: (!) 170/83  Pulse: 71  Resp: 17  Temp: (!) 97.2 F (36.2 C)  SpO2: 98%   Wt Readings from Last 3 Encounters:  10/11/21 156 lb 6.4 oz (70.9 kg)  09/13/21 156 lb 12.8 oz (71.1 kg)  08/16/21 152 lb 6.4 oz (69.1 kg)    Physical Exam Vitals reviewed.  Constitutional:      Appearance: Normal appearance.  Cardiovascular:     Rate and Rhythm: Normal rate and regular rhythm.     Pulses: Normal pulses.     Heart sounds: Normal heart sounds.  Pulmonary:     Effort: Pulmonary effort is normal.     Breath sounds: Normal breath sounds.  Neurological:     General: No focal deficit present.     Mental Status: She is alert and oriented to person, place, and time.  Psychiatric:        Mood and Affect: Mood normal.        Behavior: Behavior normal.  LABORATORY DATA:  I have reviewed the labs as listed.  CBC Latest Ref Rng & Units 10/11/2021 07/19/2021 06/21/2021  WBC 4.0 - 10.5 K/uL 3.0(L) 2.6(L) 3.1(L)  Hemoglobin 12.0 - 15.0 g/dL 12.4 11.8(L) 12.3  Hematocrit 36.0 - 46.0 % 37.6 34.9(L) 38.1  Platelets 150 - 400 K/uL 188 176 188   CMP Latest Ref Rng & Units 10/11/2021 09/13/2021 08/16/2021  Glucose 70 - 99 mg/dL 94 84 107(H)  BUN 8 - 23 mg/dL _0 Creatinine 0.44 - 1.00 mg/dL 0.82 0.86 0.81  Sodium 135 - 145 mmol/L 135 140 137  Potassium 3.5 - 5.1 mmol/L 3.2(L) 3.0(L) 3.2(L)  Chloride 98 - 111 mmol/L 100 101 98  CO2 22 - 32 mmol/L 31 32 29  Calcium 8.9 - 10.3 mg/dL 8.9 9.2 9.0  Total Protein 6.5 - 8.1 g/dL 7.6 - -  Total Bilirubin 0.3 - 1.2 mg/dL 0.4 - -  Alkaline Phos 38 - 126 U/L 138(H) - -  AST 15 - 41 U/L 23 - -  ALT 0 - 44 U/L 33 - -    DIAGNOSTIC IMAGING:  I have independently reviewed the scans and discussed with the patient. No results found.   ASSESSMENT:  1. 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  Revlimid started on 10/06/2019 due to delay. -Velcade held since 12/15/2019 due to gastric symptoms, which have resolved completely. -She was evaluated by Dr. Samule [facility]m at [facility].  Recommended to have immediate bone marrow transplant. -She has compression fractures of T8, T10 and T12.  Also L1 and L3 compression  fractures. -Revlimid and dexamethasone continued until 03/22/2020. 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 -Revlimid 10 mg daily started around first week of December 2021. - Revlimid dose decreased to 10 mg 3 weeks on 1 week off on 01/04/2021 due to worsening back pain.   2.  Upper back pain: -MRI on 07/13/2020 shows T7, T8, T12, L1 and L2 compression/superior endplate fractures.  Height loss is greatest and moderate at T7 and T8.  No evidence of bone lesions.   PLAN:  1.  IgA lambda plasma cell myeloma: - She is taking Revlimid 10 mg 3 weeks on/1 week off. - We reviewed myeloma panel from 07/19/2021.  M spike was 0.3 g.  Ratio was normal.  Immunofixation was normal. - Labs from today shows normal creatinine and calcium.  CBC shows slight leukopenia with normal ANC. - She reports bilateral shoulder pains on certain movements, which she developed in the last 1 to 2 months.  She does experience some pains when she is wearing shirts/jacket. - No other new pains reported.  No fevers or infections. - She will continue Revlimid 10 mg 3 weeks on/1 week off.  We will follow-up on SPEP and in the next 4 weeks.  We will consider imaging and/or referral to orthopedics.   2.  Mid back pain: - She was previously evaluated by IR for vertebroplasty which was not recommended. - Mid back pain is stable.   3.  Bone protection: -She will receive her last monthly Zometa today. - After today we will switch her to every 12 weeks.   4.  ID prophylaxis: - Continue acyclovir and aspirin.   5.  Neuropathy: - Continue gabapentin once daily.   6.  Hypomagnesemia: - Continue magnesium supplements.  Magnesium is normal.        Orders placed this encounter:  No orders of the defined types were placed in this encounter.    Derek Jack, MD [place] [phone]   I, Thana Ates, am acting as a scribe for Dr. Derek Jack.  I,  Derek Jack MD, have reviewed the above documentation for accuracy and completeness, and I agree with the above.

## 2021-10-11 NOTE — Progress Notes (Signed)
Patient presents today for Zometa infusion.  Patient is in satisfactory condition with no complaints voiced.  Vital signs are stable.  Labs reviewed by Dr. Delton Coombes during her office visit.  All labs are within treatment parameters.  We will proceed with treatment per MD orders.   Patient tolerated treatment well with no complaints voiced.  Patient left ambulatory in stable condition.  Vital signs stable at discharge.  Follow up as scheduled.

## 2021-10-11 NOTE — Patient Instructions (Signed)
[facility]  Discharge Instructions: Thank you for choosing [facility] to provide your oncology and hematology care.  If you have a lab appointment with the Cancer Center, please come in thru the Main Entrance and check in at the main information desk.  Wear comfortable clothing and clothing appropriate for easy access to any Portacath or PICC line.   We strive to give you quality time with your provider. You may need to reschedule your appointment if you arrive late (15 or more minutes).  Arriving late affects you and other patients whose appointments are after yours.  Also, if you miss three or more appointments without notifying the office, you may be dismissed from the clinic at the provider's discretion.      For prescription refill requests, have your pharmacy contact our office and allow 72 hours for refills to be completed.        To help prevent nausea and vomiting after your treatment, we encourage you to take your nausea medication as directed.  BELOW ARE SYMPTOMS THAT SHOULD BE REPORTED IMMEDIATELY: *FEVER GREATER THAN 100.4 F (38 C) OR HIGHER *CHILLS OR SWEATING *NAUSEA AND VOMITING THAT IS NOT CONTROLLED WITH YOUR NAUSEA MEDICATION *UNUSUAL SHORTNESS OF BREATH *UNUSUAL BRUISING OR BLEEDING *URINARY PROBLEMS (pain or burning when urinating, or frequent urination) *BOWEL PROBLEMS (unusual diarrhea, constipation, pain near the anus) TENDERNESS IN MOUTH AND THROAT WITH OR WITHOUT PRESENCE OF ULCERS (sore throat, sores in mouth, or a toothache) UNUSUAL RASH, SWELLING OR PAIN  UNUSUAL VAGINAL DISCHARGE OR ITCHING   Items with * indicate a potential emergency and should be followed up as soon as possible or go to the Emergency Department if any problems should occur.  Please show the CHEMOTHERAPY ALERT CARD or IMMUNOTHERAPY ALERT CARD at check-in to the Emergency Department and triage nurse.  Should you have questions after your visit or need to cancel  or reschedule your appointment, please contact [facility] [phone]  and follow the prompts.  Office hours are 8:00 a.m. to 4:30 p.m. Monday - Friday. Please note that voicemails left after 4:00 p.m. may not be returned until the following business day.  We are closed weekends and major holidays. You have access to a nurse at all times for urgent questions. Please call the main number to the clinic [phone] and follow the prompts.  For any non-urgent questions, you may also contact your provider using MyChart. We now offer e-Visits for anyone 18 and older to request care online for non-urgent symptoms. For details visit [email or web address].   Also download the MyChart app! Go to the app store, search "MyChart", open the app, select [facility], and log in with your MyChart username and password.  Due to Covid, a mask is required upon entering the hospital/clinic. If you do not have a mask, one will be given to you upon arrival. For doctor visits, patients may have 1 support person aged 18 or older with them. For treatment visits, patients cannot have anyone with them due to current Covid guidelines and our immunocompromised population.  

## 2021-10-11 NOTE — Patient Instructions (Signed)
[place] at [facility] Discharge Instructions   You were seen and examined today by Dr. Delton Coombes.  He reviewed your lab work with you. Your results are normal/stable.  Continue Revlimid as prescribed.    You will receive Zometa infusion today.   Return as scheduled for lab work, office visit, and infusions.    Thank you for choosing [place] at [facility] to provide your oncology and hematology care.  To afford each patient quality time with our provider, please arrive at least 15 minutes before your scheduled appointment time.   If you have a lab appointment with the [facility] please come in thru the Main Entrance and check in at the main information desk.  You need to re-schedule your appointment should you arrive 10 or more minutes late.  We strive to give you quality time with our providers, and arriving late affects you and other patients whose appointments are after yours.  Also, if you no show three or more times for appointments you may be dismissed from the clinic at the providers discretion.     Again, thank you for choosing [facility].  Our hope is that these requests will decrease the amount of time that you wait before being seen by our physicians.       _____________________________________________________________  Should you have questions after your visit to [facility], please contact our office at [phone] and follow the prompts.  Our office hours are 8:00 a.m. and 4:30 p.m. Monday - Friday.  Please note that voicemails left after 4:00 p.m. may not be returned until the following business day.  We are closed weekends and major holidays.  You do have access to a nurse 24-7, just call the main number to the clinic [phone] and do not press any options, hold on the line and a nurse will answer the phone.    For prescription refill requests, have your pharmacy contact our office and  allow 72 hours.    Due to Covid, you will need to wear a mask upon entering the hospital. If you do not have a mask, a mask will be given to you at the Main Entrance upon arrival. For doctor visits, patients may have 1 support person age 53 or older with them. For treatment visits, patients can not have anyone with them due to social distancing guidelines and our immunocompromised population.

## 2021-10-12 LAB — KAPPA/LAMBDA LIGHT CHAINS
Kappa free light chain: 36 mg/L — ABNORMAL HIGH (ref 3.3–19.4)
Kappa, lambda light chain ratio: 1.57 (ref 0.26–1.65)
Lambda free light chains: 22.9 mg/L (ref 5.7–26.3)

## 2021-10-13 ENCOUNTER — Encounter (HOSPITAL_COMMUNITY): Payer: Self-pay | Admitting: Hematology

## 2021-10-13 LAB — PROTEIN ELECTROPHORESIS, SERUM
A/G Ratio: 1.2 (ref 0.7–1.7)
Albumin ELP: 3.7 g/dL (ref 2.9–4.4)
Alpha-1-Globulin: 0.3 g/dL (ref 0.0–0.4)
Alpha-2-Globulin: 0.8 g/dL (ref 0.4–1.0)
Beta Globulin: 1 g/dL (ref 0.7–1.3)
Gamma Globulin: 1.1 g/dL (ref 0.4–1.8)
Globulin, Total: 3.2 g/dL (ref 2.2–3.9)
Total Protein ELP: 6.9 g/dL (ref 6.0–8.5)

## 2021-10-14 ENCOUNTER — Encounter (HOSPITAL_COMMUNITY): Payer: Self-pay

## 2021-10-16 LAB — IMMUNOFIXATION ELECTROPHORESIS
IgA: 104 mg/dL (ref 87–352)
IgG (Immunoglobin G), Serum: 1262 mg/dL (ref 586–1602)
IgM (Immunoglobulin M), Srm: 33 mg/dL (ref 26–217)
Total Protein ELP: 7.3 g/dL (ref 6.0–8.5)

## 2021-10-26 ENCOUNTER — Encounter (HOSPITAL_COMMUNITY): Payer: Self-pay | Admitting: *Deleted

## 2021-10-26 ENCOUNTER — Other Ambulatory Visit (HOSPITAL_COMMUNITY): Payer: Self-pay | Admitting: Hematology

## 2021-10-26 DIAGNOSIS — C9001 Multiple myeloma in remission: Secondary | ICD-10-CM

## 2021-10-26 NOTE — Progress Notes (Signed)
Revlamid script sent in.  Per OVN patient is to continue on therapy.

## 2021-10-30 ENCOUNTER — Other Ambulatory Visit: Payer: Self-pay

## 2021-10-30 ENCOUNTER — Other Ambulatory Visit (HOSPITAL_COMMUNITY): Payer: Self-pay

## 2021-10-30 ENCOUNTER — Inpatient Hospital Stay (HOSPITAL_COMMUNITY): Payer: Medicare HMO

## 2021-10-30 VITALS — BP 144/69 | HR 68 | Temp 97.3°F | Resp 18

## 2021-10-30 DIAGNOSIS — C9000 Multiple myeloma not having achieved remission: Secondary | ICD-10-CM

## 2021-10-30 DIAGNOSIS — C9001 Multiple myeloma in remission: Secondary | ICD-10-CM

## 2021-10-30 MED ORDER — DTAP-IPV-HIB VACCINE IM SUSR
0.5000 mL | Freq: Once | INTRAMUSCULAR | Status: AC
  Administered 2021-10-30: 0.5 mL via INTRAMUSCULAR
  Filled 2021-10-30: qty 1

## 2021-10-30 MED ORDER — HEPATITIS B VAC RECOMBINANT 20 MCG/ML IJ SUSP
2.0000 mL | Freq: Once | INTRAMUSCULAR | Status: AC
  Administered 2021-10-30: 40 ug via INTRAMUSCULAR
  Filled 2021-10-30: qty 2

## 2021-10-30 MED ORDER — PNEUMOCOCCAL 13-VAL CONJ VACC IM SUSP
0.5000 mL | Freq: Once | INTRAMUSCULAR | Status: AC
  Administered 2021-10-30: 0.5 mL via INTRAMUSCULAR
  Filled 2021-10-30: qty 0.5

## 2021-10-30 MED ORDER — LENALIDOMIDE 10 MG PO CAPS: ORAL_CAPSULE | ORAL | 0 refills | Status: DC

## 2021-10-30 NOTE — Patient Instructions (Signed)
[facility]  Discharge Instructions: Thank you for choosing [facility] to provide your oncology and hematology care.  If you have a lab appointment with the Cancer Center, please come in thru the Main Entrance and check in at the main information desk.  Wear comfortable clothing and clothing appropriate for easy access to any Portacath or PICC line.   We strive to give you quality time with your provider. You may need to reschedule your appointment if you arrive late (15 or more minutes).  Arriving late affects you and other patients whose appointments are after yours.  Also, if you miss three or more appointments without notifying the office, you may be dismissed from the clinic at the provider's discretion.      For prescription refill requests, have your pharmacy contact our office and allow 72 hours for refills to be completed.        To help prevent nausea and vomiting after your treatment, we encourage you to take your nausea medication as directed.  BELOW ARE SYMPTOMS THAT SHOULD BE REPORTED IMMEDIATELY: *FEVER GREATER THAN 100.4 F (38 C) OR HIGHER *CHILLS OR SWEATING *NAUSEA AND VOMITING THAT IS NOT CONTROLLED WITH YOUR NAUSEA MEDICATION *UNUSUAL SHORTNESS OF BREATH *UNUSUAL BRUISING OR BLEEDING *URINARY PROBLEMS (pain or burning when urinating, or frequent urination) *BOWEL PROBLEMS (unusual diarrhea, constipation, pain near the anus) TENDERNESS IN MOUTH AND THROAT WITH OR WITHOUT PRESENCE OF ULCERS (sore throat, sores in mouth, or a toothache) UNUSUAL RASH, SWELLING OR PAIN  UNUSUAL VAGINAL DISCHARGE OR ITCHING   Items with * indicate a potential emergency and should be followed up as soon as possible or go to the Emergency Department if any problems should occur.  Please show the CHEMOTHERAPY ALERT CARD or IMMUNOTHERAPY ALERT CARD at check-in to the Emergency Department and triage nurse.  Should you have questions after your visit or need to cancel  or reschedule your appointment, please contact [facility] [phone]  and follow the prompts.  Office hours are 8:00 a.m. to 4:30 p.m. Monday - Friday. Please note that voicemails left after 4:00 p.m. may not be returned until the following business day.  We are closed weekends and major holidays. You have access to a nurse at all times for urgent questions. Please call the main number to the clinic [phone] and follow the prompts.  For any non-urgent questions, you may also contact your provider using MyChart. We now offer e-Visits for anyone 18 and older to request care online for non-urgent symptoms. For details visit [email or web address].   Also download the MyChart app! Go to the app store, search "MyChart", open the app, select [place], and log in with your MyChart username and password.  Due to Covid, a mask is required upon entering the hospital/clinic. If you do not have a mask, one will be given to you upon arrival. For doctor visits, patients may have 1 support person aged 18 or older with them. For treatment visits, patients cannot have anyone with them due to current Covid guidelines and our immunocompromised population.  

## 2021-10-30 NOTE — Progress Notes (Signed)
Patient presents today for Pentacel, Engerix-B, and Prenvar 13 vaccines.  Patient is in satisfactory condition with no complaints voiced.  Vital signs are stable.  We will proceed with vaccines per MD orders.   Patient tolerated injections with no complaints voiced.  Site clean and dry with no bruising or swelling noted.  No complaints of pain.  Discharged with vital signs stable and no signs or symptoms of distress noted.

## 2021-10-30 NOTE — Telephone Encounter (Signed)
Chart reviewed. Revlimid refilled per last office note with Dr. Katragadda.  

## 2021-11-06 ENCOUNTER — Other Ambulatory Visit (HOSPITAL_COMMUNITY): Payer: Self-pay | Admitting: *Deleted

## 2021-11-06 ENCOUNTER — Encounter (HOSPITAL_COMMUNITY): Payer: Self-pay

## 2021-11-06 DIAGNOSIS — C9000 Multiple myeloma not having achieved remission: Secondary | ICD-10-CM

## 2021-11-06 MED ORDER — POTASSIUM CHLORIDE ER 10 MEQ PO TBCR: 10.0000 meq | EXTENDED_RELEASE_TABLET | Freq: Two times a day (BID) | ORAL | 3 refills | Status: DC

## 2021-11-14 ENCOUNTER — Encounter (HOSPITAL_COMMUNITY): Payer: Self-pay | Admitting: Emergency Medicine

## 2021-11-14 NOTE — Progress Notes (Signed)
Additional information and W2s faxed to [facility] pt assistance.  Faxed to [phone] (conformation number [phone]). ?

## 2021-11-22 ENCOUNTER — Other Ambulatory Visit (HOSPITAL_COMMUNITY): Payer: Self-pay | Admitting: Hematology

## 2021-11-24 ENCOUNTER — Other Ambulatory Visit (HOSPITAL_COMMUNITY): Payer: Self-pay | Admitting: Hematology

## 2021-11-24 DIAGNOSIS — C9001 Multiple myeloma in remission: Secondary | ICD-10-CM

## 2021-11-30 ENCOUNTER — Encounter (HOSPITAL_COMMUNITY): Payer: Self-pay | Admitting: Hematology

## 2021-11-30 NOTE — Telephone Encounter (Signed)
Chart reviewed. Revlimid refilled per last office note with Dr. Katragadda.  

## 2021-12-05 ENCOUNTER — Encounter (HOSPITAL_COMMUNITY): Payer: Self-pay

## 2021-12-08 ENCOUNTER — Other Ambulatory Visit (HOSPITAL_COMMUNITY): Payer: Self-pay

## 2021-12-08 DIAGNOSIS — C9001 Multiple myeloma in remission: Secondary | ICD-10-CM

## 2021-12-08 MED ORDER — LENALIDOMIDE 10 MG PO CAPS: ORAL_CAPSULE | ORAL | 0 refills | Status: DC

## 2021-12-08 NOTE — Telephone Encounter (Signed)
Revlimid script fowarded to [facility] per patient request ?

## 2021-12-11 ENCOUNTER — Encounter (HOSPITAL_COMMUNITY): Payer: Self-pay

## 2021-12-27 ENCOUNTER — Inpatient Hospital Stay (HOSPITAL_COMMUNITY): Payer: Medicare HMO | Attending: Hematology

## 2021-12-27 DIAGNOSIS — C9000 Multiple myeloma not having achieved remission: Secondary | ICD-10-CM | POA: Insufficient documentation

## 2021-12-27 DIAGNOSIS — Z8249 Family history of ischemic heart disease and other diseases of the circulatory system: Secondary | ICD-10-CM | POA: Insufficient documentation

## 2021-12-27 DIAGNOSIS — G629 Polyneuropathy, unspecified: Secondary | ICD-10-CM | POA: Diagnosis not present

## 2021-12-27 DIAGNOSIS — Z79899 Other long term (current) drug therapy: Secondary | ICD-10-CM | POA: Insufficient documentation

## 2021-12-27 DIAGNOSIS — Z8379 Family history of other diseases of the digestive system: Secondary | ICD-10-CM | POA: Diagnosis not present

## 2021-12-27 DIAGNOSIS — K5900 Constipation, unspecified: Secondary | ICD-10-CM | POA: Diagnosis not present

## 2021-12-27 DIAGNOSIS — Z87440 Personal history of urinary (tract) infections: Secondary | ICD-10-CM | POA: Diagnosis not present

## 2021-12-27 DIAGNOSIS — Z833 Family history of diabetes mellitus: Secondary | ICD-10-CM | POA: Insufficient documentation

## 2021-12-27 DIAGNOSIS — Z7961 Long term (current) use of immunomodulator: Secondary | ICD-10-CM | POA: Insufficient documentation

## 2021-12-27 DIAGNOSIS — M25519 Pain in unspecified shoulder: Secondary | ICD-10-CM | POA: Insufficient documentation

## 2021-12-27 DIAGNOSIS — M549 Dorsalgia, unspecified: Secondary | ICD-10-CM | POA: Diagnosis not present

## 2021-12-27 DIAGNOSIS — Z818 Family history of other mental and behavioral disorders: Secondary | ICD-10-CM | POA: Insufficient documentation

## 2021-12-27 DIAGNOSIS — G479 Sleep disorder, unspecified: Secondary | ICD-10-CM | POA: Diagnosis not present

## 2021-12-27 DIAGNOSIS — Z853 Personal history of malignant neoplasm of breast: Secondary | ICD-10-CM | POA: Diagnosis not present

## 2021-12-27 DIAGNOSIS — R200 Anesthesia of skin: Secondary | ICD-10-CM | POA: Insufficient documentation

## 2021-12-27 DIAGNOSIS — E876 Hypokalemia: Secondary | ICD-10-CM | POA: Insufficient documentation

## 2021-12-27 DIAGNOSIS — Z800 Family history of malignant neoplasm of digestive organs: Secondary | ICD-10-CM | POA: Diagnosis not present

## 2021-12-27 DIAGNOSIS — Z809 Family history of malignant neoplasm, unspecified: Secondary | ICD-10-CM | POA: Insufficient documentation

## 2021-12-27 LAB — CBC WITH DIFFERENTIAL/PLATELET
Abs Immature Granulocytes: 0.02 10*3/uL (ref 0.00–0.07)
Basophils Absolute: 0 10*3/uL (ref 0.0–0.1)
Basophils Relative: 0 %
Eosinophils Absolute: 0.3 10*3/uL (ref 0.0–0.5)
Eosinophils Relative: 6 %
HCT: 35.4 % — ABNORMAL LOW (ref 36.0–46.0)
Hemoglobin: 11.5 g/dL — ABNORMAL LOW (ref 12.0–15.0)
Immature Granulocytes: 0 %
Lymphocytes Relative: 24 %
Lymphs Abs: 1.1 10*3/uL (ref 0.7–4.0)
MCH: 30.7 pg (ref 26.0–34.0)
MCHC: 32.5 g/dL (ref 30.0–36.0)
MCV: 94.7 fL (ref 80.0–100.0)
Monocytes Absolute: 0.4 10*3/uL (ref 0.1–1.0)
Monocytes Relative: 8 %
Neutro Abs: 2.8 10*3/uL (ref 1.7–7.7)
Neutrophils Relative %: 62 %
Platelets: 204 10*3/uL (ref 150–400)
RBC: 3.74 MIL/uL — ABNORMAL LOW (ref 3.87–5.11)
RDW: 14.4 % (ref 11.5–15.5)
WBC: 4.6 10*3/uL (ref 4.0–10.5)
nRBC: 0 % (ref 0.0–0.2)

## 2021-12-27 LAB — COMPREHENSIVE METABOLIC PANEL
ALT: 33 U/L (ref 0–44)
AST: 28 U/L (ref 15–41)
Albumin: 3.7 g/dL (ref 3.5–5.0)
Alkaline Phosphatase: 113 U/L (ref 38–126)
Anion gap: 7 (ref 5–15)
BUN: 21 mg/dL (ref 8–23)
CO2: 29 mmol/L (ref 22–32)
Calcium: 9.1 mg/dL (ref 8.9–10.3)
Chloride: 102 mmol/L (ref 98–111)
Creatinine, Ser: 0.8 mg/dL (ref 0.44–1.00)
GFR, Estimated: >60 mL/min (ref >60)
Glucose, Bld: 94 mg/dL (ref 70–99)
Potassium: 3.2 mmol/L — ABNORMAL LOW (ref 3.5–5.1)
Sodium: 138 mmol/L (ref 135–145)
Total Bilirubin: 0.7 mg/dL (ref 0.3–1.2)
Total Protein: 7.2 g/dL (ref 6.5–8.1)

## 2021-12-27 LAB — MAGNESIUM: Magnesium: 2 mg/dL (ref 1.7–2.4)

## 2021-12-27 LAB — LACTATE DEHYDROGENASE: LDH: 115 U/L (ref 98–192)

## 2021-12-28 LAB — KAPPA/LAMBDA LIGHT CHAINS
Kappa free light chain: 38.4 mg/L — ABNORMAL HIGH (ref 3.3–19.4)
Kappa, lambda light chain ratio: 1.48 (ref 0.26–1.65)
Lambda free light chains: 25.9 mg/L (ref 5.7–26.3)

## 2021-12-29 LAB — IMMUNOFIXATION ELECTROPHORESIS
IgA: 155 mg/dL (ref 87–352)
IgG (Immunoglobin G), Serum: 1218 mg/dL (ref 586–1602)
IgM (Immunoglobulin M), Srm: 37 mg/dL (ref 26–217)
Total Protein ELP: 6.8 g/dL (ref 6.0–8.5)

## 2021-12-29 LAB — PROTEIN ELECTROPHORESIS, SERUM
A/G Ratio: 1.2 (ref 0.7–1.7)
Albumin ELP: 3.7 g/dL (ref 2.9–4.4)
Alpha-1-Globulin: 0.2 g/dL (ref 0.0–0.4)
Alpha-2-Globulin: 0.7 g/dL (ref 0.4–1.0)
Beta Globulin: 1 g/dL (ref 0.7–1.3)
Gamma Globulin: 1 g/dL (ref 0.4–1.8)
Globulin, Total: 3 g/dL (ref 2.2–3.9)
Total Protein ELP: 6.7 g/dL (ref 6.0–8.5)

## 2022-01-02 ENCOUNTER — Other Ambulatory Visit (HOSPITAL_COMMUNITY): Payer: Self-pay

## 2022-01-02 DIAGNOSIS — C9001 Multiple myeloma in remission: Secondary | ICD-10-CM

## 2022-01-02 MED ORDER — LENALIDOMIDE 10 MG PO CAPS: ORAL_CAPSULE | ORAL | 0 refills | Status: DC

## 2022-01-02 NOTE — Telephone Encounter (Signed)
Chart reviewed. Revlimid refilled per last office note with Dr. Katragadda.  

## 2022-01-03 ENCOUNTER — Inpatient Hospital Stay (HOSPITAL_COMMUNITY): Payer: Medicare HMO

## 2022-01-03 ENCOUNTER — Inpatient Hospital Stay (HOSPITAL_COMMUNITY): Payer: Medicare HMO | Admitting: Hematology

## 2022-01-03 VITALS — BP 143/92 | HR 60 | Temp 97.6°F | Resp 18

## 2022-01-03 VITALS — BP 150/86 | HR 66 | Temp 96.8°F | Resp 16 | Ht 66.0 in | Wt 155.6 lb

## 2022-01-03 DIAGNOSIS — C9000 Multiple myeloma not having achieved remission: Secondary | ICD-10-CM | POA: Diagnosis not present

## 2022-01-03 MED ORDER — SODIUM CHLORIDE 0.9 % IV SOLN: Freq: Once | INTRAVENOUS | Status: AC

## 2022-01-03 MED ORDER — ZOLEDRONIC ACID 4 MG/100ML IV SOLN
4.0000 mg | Freq: Once | INTRAVENOUS | Status: AC
  Administered 2022-01-03: 4 mg via INTRAVENOUS
  Filled 2022-01-03: qty 100

## 2022-01-03 NOTE — Patient Instructions (Addendum)
[place] at [facility] ?Discharge Instructions ? ? ?You were seen and examined today by Dr. Delton Coombes. ? ?He reviewed your lab work. All results are normal/stable except your potassium.  It is low and has been consistently the last several times. Take 2 potassium pills twice a day.  ? ?We will give Zometa today and in 3 months.  ? ?Return as scheduled.  ? ? ?Thank you for choosing [place] at [facility] to provide your oncology and hematology care.  To afford each patient quality time with our provider, please arrive at least 15 minutes before your scheduled appointment time.  ? ?If you have a lab appointment with the [place] please come in thru the Main Entrance and check in at the main information desk. ? ?You need to re-schedule your appointment should you arrive 10 or more minutes late.  We strive to give you quality time with our providers, and arriving late affects you and other patients whose appointments are after yours.  Also, if you no show three or more times for appointments you may be dismissed from the clinic at the providers discretion.     ?Again, thank you for choosing [facility].  Our hope is that these requests will decrease the amount of time that you wait before being seen by our physicians.       ?_____________________________________________________________ ? ?Should you have questions after your visit to [facility], please contact our office at [phone] and follow the prompts.  Our office hours are 8:00 a.m. and 4:30 p.m. Monday - Friday.  Please note that voicemails left after 4:00 p.m. may not be returned until the following business day.  We are closed weekends and major holidays.  You do have access to a nurse 24-7, just call the main number to the clinic [phone] and do not press any options, hold on the line and a nurse will answer the phone.   ? ?For prescription refill requests, have your  pharmacy contact our office and allow 72 hours.   ? ?Due to Covid, you will need to wear a mask upon entering the hospital. If you do not have a mask, a mask will be given to you at the Main Entrance upon arrival. For doctor visits, patients may have 1 support person age 1 or older with them. For treatment visits, patients can not have anyone with them due to social distancing guidelines and our immunocompromised population.  ? ?   ?

## 2022-01-03 NOTE — Progress Notes (Signed)
Patient is taking Revlimid as prescribed.  She has not missed any doses and reports no side effects at this time.   

## 2022-01-03 NOTE — Progress Notes (Signed)
? ?[facility] ?[address] ?[place], [place] [address] ? ? ?CLINIC:  ?Medical Oncology/Hematology ? ?PCP:  ?[name], MD ?[address] [place] [address]  ?[phone] ? ?REASON FOR VISIT:  ?Follow-up for multiple myeloma ? ?PRIOR THERAPY:  ?1. RVD x 6 cycles from 08/19/2019 to 12/15/2019. ?2. 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 ? ?CURRENT THERAPY: Revlimid 10 mg daily; Zometa weekly ? ?INTERVAL HISTORY:  ?[name], a 66 y.o. female, returns for routine follow-up for her multiple myeloma. Stephanie Galvan was last seen on 10/11/2021. ? ?Today she reports feeling good. Her shoulder pains have improved. She denies new pains. She is taking Revlimid and is tolerating it well. The numbness in her feet and her constipation are stable. She denies fevers and infections.  ? ?REVIEW OF SYSTEMS:  ?Review of Systems  ?Constitutional:  Negative for appetite change, fatigue and fever.  ?Gastrointestinal:  Positive for constipation (stable).  ?Musculoskeletal:  Positive for back pain (5/10).  ?Neurological:  Positive for numbness (feet - stable).  ?Psychiatric/Behavioral:  Positive for sleep disturbance.   ?All other systems reviewed and are negative. ? ?PAST MEDICAL/SURGICAL HISTORY:  ?Past Medical History:  ?Diagnosis Date  ? Breast cancer ([place]) 2006  ? left breast  ? Breast mass, right   ? Compression fracture of cervical spine ([place]) 06/18/2019  ? recent Chest xray stating had fx.  ? High cholesterol   ? History of bladder infections   ? Hypertension   ? Rash Aug 2013  ? ?Past Surgical History:  ?Procedure Laterality Date  ? ABDOMINAL HYSTERECTOMY    ? just uterus removed  ? BREAST BIOPSY    ? BREAST LUMPECTOMY WITH RADIOACTIVE SEED LOCALIZATION Right 07/24/2018  ? Procedure: RIGHT BREAST LUMPECTOMY WITH RADIOACTIVE SEED LOCALIZATION;  Surgeon: Coralie Keens, MD;  Location: [facility];  Service: General;  Laterality: Right;  ? IR RADIOLOGIST EVAL & MGMT  07/28/2020  ? MASTECTOMY  5/06   ? left  ? PARTIAL HYSTERECTOMY    ? PORT-A-CATH REMOVAL    ? PORTACATH PLACEMENT    ? ? ?SOCIAL HISTORY:  ?Social History  ? ?Socioeconomic History  ? Marital status: Married  ?  Spouse name: Not on file  ? Number of children: 2  ? Years of education: Not on file  ? Highest education level: Not on file  ?Occupational History  ?  Employer: [employer]  ?Tobacco Use  ? Smoking status: Never  ? Smokeless tobacco: Never  ?Vaping Use  ? Vaping Use: Never used  ?Substance and Sexual Activity  ? Alcohol use: No  ? Drug use: No  ? Sexual activity: Yes  ?  Birth control/protection: Surgical  ?Other Topics Concern  ? Not on file  ?Social History Narrative  ? Not on file  ? ?Social Determinants of Health  ? ?Financial Resource Strain: Not on file  ?Food Insecurity: Not on file  ?Transportation Needs: Not on file  ?Physical Activity: Not on file  ?Stress: Not on file  ?Social Connections: Not on file  ?Intimate Partner Violence: Not on file  ? ? ?FAMILY HISTORY:  ?Family History  ?Problem Relation Age of Onset  ? Dementia Mother   ? Stomach cancer Father   ? Cancer Brother   ? Cancer Brother   ? Diabetes Brother   ? Colitis Daughter   ? Hypertension Daughter   ? Hypertension Daughter   ? ? ?CURRENT MEDICATIONS:  ?Current Outpatient Medications  ?Medication Sig Dispense Refill  ?  aspirin EC 81 MG tablet Take 81 mg by mouth daily. Swallow whole.    ? calcium carbonate (TUMS - DOSED IN MG ELEMENTAL CALCIUM) 500 MG chewable tablet Chew 2 tablets by mouth 2 (two) times daily.    ? gabapentin (NEURONTIN) 300 MG capsule Take 1 capsule (300 mg total) by mouth daily. 30 capsule 3  ? hydrochlorothiazide (HYDRODIURIL) 25 MG tablet Take 25 mg by mouth daily.    ? HYDROcodone-acetaminophen (NORCO) 10-325 MG tablet Take 1 tablet by mouth 2 (two) times daily as needed for moderate pain. 45 tablet 0  ? lenalidomide (REVLIMID) 10 MG capsule TAKE 1 CAPSULE BY MOUTH EVERY DAY FOR 3 WEEKS ON THEN 1 WEEK OFF FOR A 28 DAY SUPPLY 21 capsule 0  ?  magnesium oxide (MAG-OX) 400 (241.3 Mg) MG tablet Take 1 tablet (400 mg total) by mouth daily. 60 tablet 3  ? magnesium oxide (MAG-OX) 400 MG tablet Take 1 tablet by mouth daily.    ? potassium chloride (KLOR-CON) 10 MEQ tablet Take 1 tablet (10 mEq total) by mouth 2 (two) times daily. 180 tablet 3  ? Vitamin D, Ergocalciferol, (DRISDOL) 1.25 MG (50000 UNIT) CAPS capsule TAKE 1 CAPSULE BY MOUTH ONE TIME PER WEEK 12 capsule 1  ? ?No current facility-administered medications for this visit.  ? ? ?ALLERGIES:  ?No Known Allergies ? ?PHYSICAL EXAM:  ?Performance status (ECOG): 1 - Symptomatic but completely ambulatory ? ?Vitals:  ? 01/03/22 1304  ?BP: (!) 150/86  ?Pulse: 66  ?Resp: 16  ?Temp: (!) 96.8 ?F (36 ?C)  ?SpO2: 100%  ? ?Wt Readings from Last 3 Encounters:  ?01/03/22 155 lb 10.3 oz (70.6 kg)  ?10/11/21 156 lb 6.4 oz (70.9 kg)  ?09/13/21 156 lb 12.8 oz (71.1 kg)  ? ?Physical Exam ?Vitals reviewed.  ?Constitutional:   ?   Appearance: Normal appearance.  ?Cardiovascular:  ?   Rate and Rhythm: Normal rate and regular rhythm.  ?   Pulses: Normal pulses.  ?   Heart sounds: Normal heart sounds.  ?Pulmonary:  ?   Effort: Pulmonary effort is normal.  ?   Breath sounds: Normal breath sounds.  ?Neurological:  ?   General: No focal deficit present.  ?   Mental Status: She is alert and oriented to person, place, and time.  ?Psychiatric:     ?   Mood and Affect: Mood normal.     ?   Behavior: Behavior normal.  ? ? ?LABORATORY DATA:  ?I have reviewed the labs as listed.  ? ?  Latest Ref Rng & Units 12/27/2021  ?  1:30 PM 10/11/2021  ? 11:49 AM 07/19/2021  ? 11:59 AM  ?CBC  ?WBC 4.0 - 10.5 K/uL 4.6   3.0   2.6    ?Hemoglobin 12.0 - 15.0 g/dL 11.5   12.4   11.8    ?Hematocrit 36.0 - 46.0 % 35.4   37.6   34.9    ?Platelets 150 - 400 K/uL 204   188   176    ? ? ?  Latest Ref Rng & Units 12/27/2021  ?  1:30 PM 10/11/2021  ? 11:49 AM 09/13/2021  ? 12:13 PM  ?CMP  ?Glucose 70 - 99 mg/dL 94   94   84    ?BUN 8 - 23 mg/dL _0 ?Creatinine 0.44 - 1.00 mg/dL 0.80   0.82   0.86    ?Sodium 135 - 145 mmol/L  138   135   140    ?Potassium 3.5 - 5.1 mmol/L 3.2   3.2   3.0    ?Chloride 98 - 111 mmol/L 102   100   101    ?CO2 22 - 32 mmol/L 29   31   32    ?Calcium 8.9 - 10.3 mg/dL 9.1   8.9   9.2    ?Total Protein 6.5 - 8.1 g/dL 7.2   7.6     ?Total Bilirubin 0.3 - 1.2 mg/dL 0.7   0.4     ?Alkaline Phos 38 - 126 U/L 113   138     ?AST 15 - 41 U/L 28   23     ?ALT 0 - 44 U/L 33   33     ? ?   ?Component Value Date/Time  ? RBC 3.74 (L) 12/27/2021 1330  ? MCV 94.7 12/27/2021 1330  ? MCH 30.7 12/27/2021 1330  ? MCHC 32.5 12/27/2021 1330  ? RDW 14.4 12/27/2021 1330  ? LYMPHSABS 1.1 12/27/2021 1330  ? MONOABS 0.4 12/27/2021 1330  ? EOSABS 0.3 12/27/2021 1330  ? BASOSABS 0.0 12/27/2021 1330  ? ? ?DIAGNOSTIC IMAGING:  ?I have independently reviewed the scans and discussed with the patient. ?No results found.  ? ?ASSESSMENT:  ?1. 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  Revlimid started on 10/06/2019 due to delay. ?-Velcade held since 12/15/2019 due to gastric symptoms, which have resolved completely. ?-She was evaluated by Dr. Samule Ohm at [facility]  Recommended to have immediate bone marrow transplant. ?-She has compression fractures of T8, T10 and T12.  Also L1 and L3 compression fractures. ?-Revlimid and dexamethasone continued until 03/22/2020. 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 ?-Revlimid 10 mg daily started around first week of December 2021. ?- Revlimid dose decreased to 10 mg 3 weeks on 1 week off on 01/04/2021 due to worsening back pain. ?  ?2.  Upper/mid back pain: ?-MRI on 07/13/2020 shows T7, T8, T12, L1 and L2 compression/superior endplate fractures.  Height loss is greatest and moderate at T7 and T8.  No evidence of bone lesions. ?- She was previously evaluated by IR for vertebroplasty which was not recommended. ? ? ?PLAN:  ?1.  IgA  lambda plasma cell myeloma: ?- She reported that her shoulder pains from last visit has improved. ?- Numbness in the feet has been stable.  Constipation is also stable. ?- Reviewed labs from 12/27/2021.  M spike

## 2022-01-03 NOTE — Patient Instructions (Signed)
[place]  Discharge Instructions: ?Thank you for choosing [place] to provide your oncology and hematology care.  ?If you have a lab appointment with the [facility], please come in thru the Main Entrance and check in at the main information desk. ? ?Wear comfortable clothing and clothing appropriate for easy access to any Portacath or PICC line.  ? ?We strive to give you quality time with your provider. You may need to reschedule your appointment if you arrive late (15 or more minutes).  Arriving late affects you and other patients whose appointments are after yours.  Also, if you miss three or more appointments without notifying the office, you may be dismissed from the clinic at the provider?s discretion.    ?  ?For prescription refill requests, have your pharmacy contact our office and allow 72 hours for refills to be completed.   ? ?Today you received the following chemotherapy and/or immunotherapy agents Zometa    ?  ?To help prevent nausea and vomiting after your treatment, we encourage you to take your nausea medication as directed. ? ?BELOW ARE SYMPTOMS THAT SHOULD BE REPORTED IMMEDIATELY: ?*FEVER GREATER THAN 100.4 F (38 ?C) OR HIGHER ?*CHILLS OR SWEATING ?*NAUSEA AND VOMITING THAT IS NOT CONTROLLED WITH YOUR NAUSEA MEDICATION ?*UNUSUAL SHORTNESS OF BREATH ?*UNUSUAL BRUISING OR BLEEDING ?*URINARY PROBLEMS (pain or burning when urinating, or frequent urination) ?*BOWEL PROBLEMS (unusual diarrhea, constipation, pain near the anus) ?TENDERNESS IN MOUTH AND THROAT WITH OR WITHOUT PRESENCE OF ULCERS (sore throat, sores in mouth, or a toothache) ?UNUSUAL RASH, SWELLING OR PAIN  ?UNUSUAL VAGINAL DISCHARGE OR ITCHING  ? ?Items with * indicate a potential emergency and should be followed up as soon as possible or go to the Emergency Department if any problems should occur. ? ?Please show the CHEMOTHERAPY ALERT CARD or IMMUNOTHERAPY ALERT CARD at check-in to the Emergency  Department and triage nurse. ? ?Should you have questions after your visit or need to cancel or reschedule your appointment, please contact [facility] [phone]  and follow the prompts.  Office hours are 8:00 a.m. to 4:30 p.m. Monday - Friday. Please note that voicemails left after 4:00 p.m. may not be returned until the following business day.  We are closed weekends and major holidays. You have access to a nurse at all times for urgent questions. Please call the main number to the clinic [phone] and follow the prompts. ? ?For any non-urgent questions, you may also contact your provider using MyChart. We now offer e-Visits for anyone 56 and older to request care online for non-urgent symptoms. For details visit [email or web address]. ?  ?Also download the MyChart app! Go to the app store, search "MyChart", open the app, select [place], and log in with your MyChart username and password. ? ?Due to Covid, a mask is required upon entering the hospital/clinic. If you do not have a mask, one will be given to you upon arrival. For doctor visits, patients may have 1 support person aged 67 or older with them. For treatment visits, patients cannot have anyone with them due to current Covid guidelines and our immunocompromised population.  ?

## 2022-01-03 NOTE — Progress Notes (Signed)
Patient presents today for Zometa infusion per providers order.  Vital signs and labs within parameters for treatment.   ? ?Message received from Anastasio Champion RN/Dr. Delton Coombes patient okay for treatment. ? ?Peripheral IV started and blood return noted pre and post infusion. ? ? given today per MD orders.  Tolerated infusion without adverse affects.  Vital signs stable.  No complaints at this time.  Discharge from clinic ambulatory in stable condition.  Alert and oriented X 3.  Follow up with [facility] as scheduled.  ?

## 2022-01-11 ENCOUNTER — Encounter (HOSPITAL_COMMUNITY): Payer: Self-pay

## 2022-01-23 ENCOUNTER — Encounter (HOSPITAL_COMMUNITY): Payer: Self-pay

## 2022-01-24 ENCOUNTER — Other Ambulatory Visit (HOSPITAL_COMMUNITY): Payer: Self-pay | Admitting: *Deleted

## 2022-01-24 DIAGNOSIS — C9000 Multiple myeloma not having achieved remission: Secondary | ICD-10-CM

## 2022-01-24 MED ORDER — POTASSIUM CHLORIDE ER 10 MEQ PO TBCR: 20.0000 meq | EXTENDED_RELEASE_TABLET | Freq: Two times a day (BID) | ORAL | 3 refills | Status: DC

## 2022-02-06 ENCOUNTER — Encounter (HOSPITAL_COMMUNITY): Payer: Self-pay

## 2022-02-06 ENCOUNTER — Other Ambulatory Visit (HOSPITAL_COMMUNITY): Payer: Self-pay

## 2022-02-06 DIAGNOSIS — C9001 Multiple myeloma in remission: Secondary | ICD-10-CM

## 2022-02-06 MED ORDER — LENALIDOMIDE 10 MG PO CAPS: ORAL_CAPSULE | ORAL | 0 refills | Status: DC

## 2022-02-06 NOTE — Telephone Encounter (Signed)
Chart reviewed. Revlimid refilled per last office note with Dr. Katragadda.  

## 2022-02-24 ENCOUNTER — Encounter (HOSPITAL_COMMUNITY): Payer: Self-pay

## 2022-02-26 ENCOUNTER — Other Ambulatory Visit (HOSPITAL_COMMUNITY): Payer: Self-pay | Admitting: *Deleted

## 2022-03-07 ENCOUNTER — Other Ambulatory Visit (HOSPITAL_COMMUNITY): Payer: Self-pay

## 2022-03-07 DIAGNOSIS — C9001 Multiple myeloma in remission: Secondary | ICD-10-CM

## 2022-03-07 MED ORDER — LENALIDOMIDE 10 MG PO CAPS: ORAL_CAPSULE | ORAL | 0 refills | Status: DC

## 2022-03-07 NOTE — Telephone Encounter (Signed)
Chart reviewed. Revlimid refilled per last office note with Dr. Katragadda.  

## 2022-03-28 ENCOUNTER — Inpatient Hospital Stay (HOSPITAL_COMMUNITY): Payer: Medicare HMO | Attending: Hematology

## 2022-03-28 DIAGNOSIS — Z853 Personal history of malignant neoplasm of breast: Secondary | ICD-10-CM | POA: Diagnosis not present

## 2022-03-28 DIAGNOSIS — Z79899 Other long term (current) drug therapy: Secondary | ICD-10-CM | POA: Insufficient documentation

## 2022-03-28 DIAGNOSIS — Z7961 Long term (current) use of immunomodulator: Secondary | ICD-10-CM | POA: Insufficient documentation

## 2022-03-28 DIAGNOSIS — K5900 Constipation, unspecified: Secondary | ICD-10-CM | POA: Insufficient documentation

## 2022-03-28 DIAGNOSIS — R200 Anesthesia of skin: Secondary | ICD-10-CM | POA: Insufficient documentation

## 2022-03-28 DIAGNOSIS — Z800 Family history of malignant neoplasm of digestive organs: Secondary | ICD-10-CM | POA: Insufficient documentation

## 2022-03-28 DIAGNOSIS — Z809 Family history of malignant neoplasm, unspecified: Secondary | ICD-10-CM | POA: Diagnosis not present

## 2022-03-28 DIAGNOSIS — C9000 Multiple myeloma not having achieved remission: Secondary | ICD-10-CM | POA: Insufficient documentation

## 2022-03-28 DIAGNOSIS — R519 Headache, unspecified: Secondary | ICD-10-CM | POA: Diagnosis not present

## 2022-03-28 DIAGNOSIS — Z833 Family history of diabetes mellitus: Secondary | ICD-10-CM | POA: Diagnosis not present

## 2022-03-28 DIAGNOSIS — M25471 Effusion, right ankle: Secondary | ICD-10-CM | POA: Diagnosis not present

## 2022-03-28 DIAGNOSIS — Z818 Family history of other mental and behavioral disorders: Secondary | ICD-10-CM | POA: Diagnosis not present

## 2022-03-28 DIAGNOSIS — I10 Essential (primary) hypertension: Secondary | ICD-10-CM | POA: Diagnosis not present

## 2022-03-28 DIAGNOSIS — Z8249 Family history of ischemic heart disease and other diseases of the circulatory system: Secondary | ICD-10-CM | POA: Diagnosis not present

## 2022-03-28 DIAGNOSIS — Z8379 Family history of other diseases of the digestive system: Secondary | ICD-10-CM | POA: Insufficient documentation

## 2022-03-28 DIAGNOSIS — M25472 Effusion, left ankle: Secondary | ICD-10-CM | POA: Insufficient documentation

## 2022-03-28 DIAGNOSIS — M549 Dorsalgia, unspecified: Secondary | ICD-10-CM | POA: Diagnosis not present

## 2022-03-28 LAB — CBC WITH DIFFERENTIAL/PLATELET
Abs Immature Granulocytes: 0 10*3/uL (ref 0.00–0.07)
Basophils Absolute: 0 10*3/uL (ref 0.0–0.1)
Basophils Relative: 1 %
Eosinophils Absolute: 0.2 10*3/uL (ref 0.0–0.5)
Eosinophils Relative: 6 %
HCT: 36.1 % (ref 36.0–46.0)
Hemoglobin: 11.7 g/dL — ABNORMAL LOW (ref 12.0–15.0)
Immature Granulocytes: 0 %
Lymphocytes Relative: 31 %
Lymphs Abs: 0.9 10*3/uL (ref 0.7–4.0)
MCH: 30.3 pg (ref 26.0–34.0)
MCHC: 32.4 g/dL (ref 30.0–36.0)
MCV: 93.5 fL (ref 80.0–100.0)
Monocytes Absolute: 0.3 10*3/uL (ref 0.1–1.0)
Monocytes Relative: 13 %
Neutro Abs: 1.3 10*3/uL — ABNORMAL LOW (ref 1.7–7.7)
Neutrophils Relative %: 49 %
Platelets: 197 10*3/uL (ref 150–400)
RBC: 3.86 MIL/uL — ABNORMAL LOW (ref 3.87–5.11)
RDW: 14.1 % (ref 11.5–15.5)
WBC: 2.7 10*3/uL — ABNORMAL LOW (ref 4.0–10.5)
nRBC: 0 % (ref 0.0–0.2)

## 2022-03-28 LAB — COMPREHENSIVE METABOLIC PANEL
ALT: 33 U/L (ref 0–44)
AST: 27 U/L (ref 15–41)
Albumin: 3.8 g/dL (ref 3.5–5.0)
Alkaline Phosphatase: 103 U/L (ref 38–126)
Anion gap: 8 (ref 5–15)
BUN: 17 mg/dL (ref 8–23)
CO2: 28 mmol/L (ref 22–32)
Calcium: 9.3 mg/dL (ref 8.9–10.3)
Chloride: 102 mmol/L (ref 98–111)
Creatinine, Ser: 0.89 mg/dL (ref 0.44–1.00)
GFR, Estimated: >60 mL/min (ref >60)
Glucose, Bld: 92 mg/dL (ref 70–99)
Potassium: 3.4 mmol/L — ABNORMAL LOW (ref 3.5–5.1)
Sodium: 138 mmol/L (ref 135–145)
Total Bilirubin: 0.8 mg/dL (ref 0.3–1.2)
Total Protein: 7.4 g/dL (ref 6.5–8.1)

## 2022-03-28 LAB — LACTATE DEHYDROGENASE: LDH: 107 U/L (ref 98–192)

## 2022-03-28 LAB — MAGNESIUM: Magnesium: 1.9 mg/dL (ref 1.7–2.4)

## 2022-03-29 LAB — KAPPA/LAMBDA LIGHT CHAINS
Kappa free light chain: 45.7 mg/L — ABNORMAL HIGH (ref 3.3–19.4)
Kappa, lambda light chain ratio: 1.71 — ABNORMAL HIGH (ref 0.26–1.65)
Lambda free light chains: 26.8 mg/L — ABNORMAL HIGH (ref 5.7–26.3)

## 2022-03-30 LAB — PROTEIN ELECTROPHORESIS, SERUM
A/G Ratio: 1.1 (ref 0.7–1.7)
Albumin ELP: 3.5 g/dL (ref 2.9–4.4)
Alpha-1-Globulin: 0.2 g/dL (ref 0.0–0.4)
Alpha-2-Globulin: 0.7 g/dL (ref 0.4–1.0)
Beta Globulin: 1 g/dL (ref 0.7–1.3)
Gamma Globulin: 1.2 g/dL (ref 0.4–1.8)
Globulin, Total: 3.2 g/dL (ref 2.2–3.9)
Total Protein ELP: 6.7 g/dL (ref 6.0–8.5)

## 2022-04-02 LAB — IMMUNOFIXATION ELECTROPHORESIS
IgA: 183 mg/dL (ref 87–352)
IgG (Immunoglobin G), Serum: 1191 mg/dL (ref 586–1602)
IgM (Immunoglobulin M), Srm: 35 mg/dL (ref 26–217)
Total Protein ELP: 6.8 g/dL (ref 6.0–8.5)

## 2022-04-03 ENCOUNTER — Other Ambulatory Visit (HOSPITAL_COMMUNITY): Payer: Self-pay

## 2022-04-03 DIAGNOSIS — C9001 Multiple myeloma in remission: Secondary | ICD-10-CM

## 2022-04-03 MED ORDER — LENALIDOMIDE 10 MG PO CAPS: ORAL_CAPSULE | ORAL | 0 refills | Status: DC

## 2022-04-03 NOTE — Telephone Encounter (Signed)
Chart reviewed. Revlimid refilled per last office note with Dr. Katragadda.  

## 2022-04-04 ENCOUNTER — Inpatient Hospital Stay (HOSPITAL_COMMUNITY): Payer: Medicare HMO

## 2022-04-04 ENCOUNTER — Inpatient Hospital Stay (HOSPITAL_COMMUNITY): Payer: Medicare HMO | Admitting: Hematology

## 2022-04-04 VITALS — BP 156/75 | HR 68 | Temp 98.5°F | Resp 18 | Ht 66.0 in | Wt 156.2 lb

## 2022-04-04 DIAGNOSIS — C9000 Multiple myeloma not having achieved remission: Secondary | ICD-10-CM

## 2022-04-04 MED ORDER — ZOLEDRONIC ACID 4 MG/100ML IV SOLN
4.0000 mg | Freq: Once | INTRAVENOUS | Status: AC
  Administered 2022-04-04: 4 mg via INTRAVENOUS
  Filled 2022-04-04: qty 100

## 2022-04-04 MED ORDER — PNEUMOCOCCAL VAC POLYVALENT 25 MCG/0.5ML IJ INJ
0.5000 mL | INJECTION | Freq: Once | INTRAMUSCULAR | Status: DC
  Filled 2022-04-04: qty 0.5

## 2022-04-04 MED ORDER — SODIUM CHLORIDE 0.9 % IV SOLN: Freq: Once | INTRAVENOUS | Status: AC

## 2022-04-04 NOTE — Progress Notes (Signed)
Patient is taking Revlimid as prescribed.  She has not missed any doses and reports no side effects at this time.   

## 2022-04-04 NOTE — Patient Instructions (Signed)
[facility] at [facility] Discharge Instructions   You were seen and examined today by Dr. Katragadda.  He reviewed the results of your lab work which are normal/stable.   We will proceed with your Zometa infusion today.  Continue Revlimid as prescribed.   Return as scheduled in 3 months.    Thank you for choosing [facility] at [facility] to provide your oncology and hematology care.  To afford each patient quality time with our provider, please arrive at least 15 minutes before your scheduled appointment time.   If you have a lab appointment with the Cancer Center please come in thru the Main Entrance and check in at the main information desk.  You need to re-schedule your appointment should you arrive 10 or more minutes late.  We strive to give you quality time with our providers, and arriving late affects you and other patients whose appointments are after yours.  Also, if you no show three or more times for appointments you may be dismissed from the clinic at the providers discretion.     Again, thank you for choosing [facility].  Our hope is that these requests will decrease the amount of time that you wait before being seen by our physicians.       _____________________________________________________________  Should you have questions after your visit to [facility], please contact our office at [phone] and follow the prompts.  Our office hours are 8:00 a.m. and 4:30 p.m. Monday - Friday.  Please note that voicemails left after 4:00 p.m. may not be returned until the following business day.  We are closed weekends and major holidays.  You do have access to a nurse 24-7, just call the main number to the clinic [phone] and do not press any options, hold on the line and a nurse will answer the phone.    For prescription refill requests, have your pharmacy contact our office and allow 72 hours.    Due  to Covid, you will need to wear a mask upon entering the hospital. If you do not have a mask, a mask will be given to you at the Main Entrance upon arrival. For doctor visits, patients may have 1 support person age 18 or older with them. For treatment visits, patients can not have anyone with them due to social distancing guidelines and our immunocompromised population.      

## 2022-04-04 NOTE — Progress Notes (Signed)
[place], [place] [address]   CLINIC:  Medical Oncology/Hematology  PCP:  [name], MD [place] / [place] [address] [phone]   REASON FOR VISIT:  Follow-up for multiple myeloma  PRIOR THERAPY:  1. RVD x 6 cycles from 08/19/2019 to 12/15/2019. 2. 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  CURRENT THERAPY: Revlimid 10 mg daily; Zometa weekly  BRIEF ONCOLOGIC HISTORY:  Oncology History  Multiple myeloma ([facility])  08/22/2019 Initial Diagnosis   Multiple myeloma ([facility])   08/22/2019 Cancer Staging   Staging form: Plasma Cell Myeloma and Plasma Cell Disorders, AJCC 8th Edition - Clinical stage from 08/22/2019: RISS Stage II (Beta-2-microglobulin (mg/L): 3.8, Albumin (g/dL): 3.2, ISS: Stage II, High-risk cytogenetics: Absent, LDH: Normal) - Signed by Derek Jack, MD on 08/22/2019     CANCER STAGING: Cancer Staging  Multiple myeloma ([place]) Staging form: Plasma Cell Myeloma and Plasma Cell Disorders, AJCC 8th Edition - Clinical stage from 08/22/2019: RISS Stage II (Beta-2-microglobulin (mg/L): 3.8, Albumin (g/dL): 3.2, ISS: Stage II, High-risk cytogenetics: Absent, LDH: Normal) - Signed by Derek Jack, MD on 08/22/2019   INTERVAL HISTORY:  [name], a 66 y.o. female, returns for routine follow-up of her multiple myeloma. Peytin was last seen on 01/03/2022.   Today she reports feeling good. She reports continued discomfort in her back which has improved slightly, and the numbness in her feet is stable. She reports mild headaches. She denies n/v/d and reports mild constipation. She continues to take Revlimid 21 days on and 7 days off, and she reports slight improvement in her energy during her days off Revlimid. She denies jaw pain or dental issues. She denies new cough and infections. She stopped Gabapentin as she reports it caused swelling in her ankles, and she denies any pain associated with the numbness in  her feet.   REVIEW OF SYSTEMS:  Review of Systems  Constitutional:  Negative for appetite change and fatigue.  Respiratory:  Negative for cough.   Gastrointestinal:  Positive for constipation. Negative for diarrhea, nausea and vomiting.  Musculoskeletal:  Positive for back pain (4/10).  Neurological:  Positive for headaches and numbness.  All other systems reviewed and are negative.   PAST MEDICAL/SURGICAL HISTORY:  Past Medical History:  Diagnosis Date   Breast cancer ([facility]) 2006   left breast   Breast mass, right    Compression fracture of cervical spine ([facility]) 06/18/2019   recent Chest xray stating had fx.   High cholesterol    History of bladder infections    Hypertension    Rash Aug 2013   Past Surgical History:  Procedure Laterality Date   ABDOMINAL HYSTERECTOMY     just uterus removed   BREAST BIOPSY     BREAST LUMPECTOMY WITH RADIOACTIVE SEED LOCALIZATION Right 07/24/2018   Procedure: RIGHT BREAST LUMPECTOMY WITH RADIOACTIVE SEED LOCALIZATION;  Surgeon: Coralie Keens, MD;  Location: [facility];  Service: General;  Laterality: Right;   IR RADIOLOGIST EVAL & MGMT  07/28/2020   MASTECTOMY  5/06   left   PARTIAL HYSTERECTOMY     PORT-A-CATH REMOVAL     PORTACATH PLACEMENT      SOCIAL HISTORY:  Social History   Socioeconomic History   Marital status: Married    Spouse name: Not on file   Number of children: 2   Years of education: Not on file   Highest education level: Not on file  Occupational History    Employer: [employer]  Tobacco Use   Smoking status: Never   Smokeless tobacco: Never  Vaping Use   Vaping Use: Never used  Substance and Sexual Activity   Alcohol use: No   Drug use: No   Sexual activity: Yes    Birth control/protection: Surgical  Other Topics Concern   Not on file  Social History Narrative   Not on file   Social Determinants of Health   Financial Resource Strain: Low Risk  (07/20/2020)   Overall Financial  Resource Strain (CARDIA)    Difficulty of Paying Living Expenses: Not hard at all  Food Insecurity: No Food Insecurity (07/20/2020)   Hunger Vital Sign    Worried About Running Out of Food in the Last Year: Never true    Ran Out of Food in the Last Year: Never true  Transportation Needs: No Transportation Needs (07/20/2020)   PRAPARE - Hydrologist (Medical): No    Lack of Transportation (Non-Medical): No  Physical Activity: Inactive (07/20/2020)   Exercise Vital Sign    Days of Exercise per Week: 0 days    Minutes of Exercise per Session: 0 min  Stress: No Stress Concern Present (07/20/2020)   [place]    Feeling of Stress : Not at all  Social Connections: Moderately Integrated (07/20/2020)   Social Connection and Isolation Panel [NHANES]    Frequency of Communication with Friends and Family: More than three times a week    Frequency of Social Gatherings with Friends and Family: Never    Attends Religious Services: More than 4 times per year    Active Member of [employer] or Organizations: No    Attends Archivist Meetings: Never    Marital Status: Married  Human resources officer Violence: Not At Risk (07/20/2020)   Humiliation, Afraid, Rape, and Kick questionnaire    Fear of Current or Ex-Partner: No    Emotionally Abused: No    Physically Abused: No    Sexually Abused: No    FAMILY HISTORY:  Family History  Problem Relation Age of Onset   Dementia Mother    Stomach cancer Father    Cancer Brother    Cancer Brother    Diabetes Brother    Colitis Daughter    Hypertension Daughter    Hypertension Daughter     CURRENT MEDICATIONS:  Current Outpatient Medications  Medication Sig Dispense Refill   aspirin EC 81 MG tablet Take 81 mg by mouth daily. Swallow whole.     calcium carbonate (TUMS - DOSED IN MG ELEMENTAL CALCIUM) 500 MG chewable tablet Chew 2 tablets by mouth 2 (two)  times daily.     gabapentin (NEURONTIN) 300 MG capsule Take 1 capsule (300 mg total) by mouth daily. 30 capsule 3   hydrochlorothiazide (HYDRODIURIL) 25 MG tablet Take 25 mg by mouth daily.     HYDROcodone-acetaminophen (NORCO) 10-325 MG tablet Take 1 tablet by mouth 2 (two) times daily as needed for moderate pain. 45 tablet 0   lenalidomide (REVLIMID) 10 MG capsule TAKE 1 CAPSULE BY MOUTH EVERY DAY FOR 3 WEEKS ON THEN 1 WEEK OFF FOR A 28 DAY SUPPLY 21 capsule 0   magnesium oxide (MAG-OX) 400 (241.3 Mg) MG tablet Take 1 tablet (400 mg total) by mouth daily. 60 tablet 3   magnesium oxide (MAG-OX) 400 MG tablet Take 1 tablet by mouth daily.     potassium chloride (KLOR-CON) 10 MEQ tablet Take 2 tablets (20 mEq  total) by mouth 2 (two) times daily. 180 tablet 3   Vitamin D, Ergocalciferol, (DRISDOL) 1.25 MG (50000 UNIT) CAPS capsule TAKE 1 CAPSULE BY MOUTH ONE TIME PER WEEK 12 capsule 1   No current facility-administered medications for this visit.    ALLERGIES:  No Known Allergies  PHYSICAL EXAM:  Performance status (ECOG): 1 - Symptomatic but completely ambulatory  There were no vitals filed for this visit. Wt Readings from Last 3 Encounters:  01/03/22 155 lb 10.3 oz (70.6 kg)  10/11/21 156 lb 6.4 oz (70.9 kg)  09/13/21 156 lb 12.8 oz (71.1 kg)   Physical Exam Vitals reviewed.  Constitutional:      Appearance: Normal appearance.  Cardiovascular:     Rate and Rhythm: Normal rate and regular rhythm.     Pulses: Normal pulses.     Heart sounds: Normal heart sounds.  Pulmonary:     Effort: Pulmonary effort is normal.     Breath sounds: Normal breath sounds.  Neurological:     General: No focal deficit present.     Mental Status: She is alert and oriented to person, place, and time.  Psychiatric:        Mood and Affect: Mood normal.        Behavior: Behavior normal.      LABORATORY DATA:  I have reviewed the labs as listed.     Latest Ref Rng & Units 03/28/2022    2:32 PM  12/27/2021    1:30 PM 10/11/2021   11:49 AM  CBC  WBC 4.0 - 10.5 K/uL 2.7  4.6  3.0   Hemoglobin 12.0 - 15.0 g/dL 11.7  11.5  12.4   Hematocrit 36.0 - 46.0 % 36.1  35.4  37.6   Platelets 150 - 400 K/uL 197  204  188       Latest Ref Rng & Units 03/28/2022    2:32 PM 12/27/2021    1:30 PM 10/11/2021   11:49 AM  CMP  Glucose 70 - 99 mg/dL 92  94  94   BUN 8 - 23 mg/dL _0 Creatinine 0.44 - 1.00 mg/dL 0.89  0.80  0.82   Sodium 135 - 145 mmol/L 138  138  135   Potassium 3.5 - 5.1 mmol/L 3.4  3.2  3.2   Chloride 98 - 111 mmol/L 102  102  100   CO2 22 - 32 mmol/L _1 Calcium 8.9 - 10.3 mg/dL 9.3  9.1  8.9   Total Protein 6.5 - 8.1 g/dL 7.4  7.2  7.6   Total Bilirubin 0.3 - 1.2 mg/dL 0.8  0.7  0.4   Alkaline Phos 38 - 126 U/L 103  113  138   AST 15 - 41 U/L _2 ALT 0 - 44 U/L 33  33  33     DIAGNOSTIC IMAGING:  I have independently reviewed the scans and discussed with the patient. No results found.   ASSESSMENT:  1. 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  Revlimid started on 10/06/2019 due to delay. -Velcade held since 12/15/2019 due to gastric symptoms, which have resolved completely. -She was evaluated by Dr. Samule Ohm at [facility]  Recommended to have immediate bone marrow transplant. -She has compression fractures of T8, T10 and T12.  Also L1 and L3 compression fractures. -Revlimid and dexamethasone continued until 03/22/2020. 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 -Revlimid 10 mg daily started around first week of December 2021. - Revlimid dose decreased to 10 mg 3 weeks on 1 week off on 01/04/2021 due to worsening back pain.   2.  Upper/mid back pain: -MRI on 07/13/2020 shows T7, T8, T12, L1 and L2 compression/superior endplate fractures.  Height loss is greatest and moderate at T7 and T8.  No evidence of bone lesions. - She was previously evaluated  by IR for vertebroplasty which was not recommended.   PLAN:  1.  IgA lambda plasma cell myeloma: - Denies any new onset pains from last visit. - She is tolerating Revlimid reasonably well. - Reviewed labs from 03/28/2022 which showed M spike was negative.  Immunofixation was normal.  Free light chain ratio is 1.71 with lambda light chains 26.  CBC shows mild leukopenia from Revlimid.  CMP was normal. - Continue Revlimid 10 mg 3 weeks on/1 week off.  She has slight constipation from it. - RTC 12 weeks for follow-up with repeat myeloma labs.   2.  Hypokalemia: - Continue potassium 20 mEq twice daily.  Potassium today is 3.4.   3.  Bone protection: - Continue Zometa today and every 12 weeks.  Calcium is normal.   4.  Thromboprophylaxis: - Continue aspirin daily.   5.  Neuropathy: - She is not taking gabapentin.  Neuropathy in the feet is stable.   6.  Hypomagnesemia: - Continue magnesium supplements.   Orders placed this encounter:  No orders of the defined types were placed in this encounter.    Derek Jack, MD [facility] [phone]   I, Thana Ates, am acting as a scribe for Dr. Derek Jack.  I, Derek Jack MD, have reviewed the above documentation for accuracy and completeness, and I agree with the above.

## 2022-04-04 NOTE — Patient Instructions (Signed)
[facility]  Discharge Instructions: Thank you for choosing [facility] to provide your oncology and hematology care.  If you have a lab appointment with the [facility], please come in thru the Main Entrance and check in at the main information desk.  Wear comfortable clothing and clothing appropriate for easy access to any Portacath or PICC line.   We strive to give you quality time with your provider. You may need to reschedule your appointment if you arrive late (15 or more minutes).  Arriving late affects you and other patients whose appointments are after yours.  Also, if you miss three or more appointments without notifying the office, you may be dismissed from the clinic at the provider's discretion.      For prescription refill requests, have your pharmacy contact our office and allow 72 hours for refills to be completed.    Today you received the following chemotherapy and/or immunotherapy agents zometa      To help prevent nausea and vomiting after your treatment, we encourage you to take your nausea medication as directed.  BELOW ARE SYMPTOMS THAT SHOULD BE REPORTED IMMEDIATELY: *FEVER GREATER THAN 100.4 F (38 C) OR HIGHER *CHILLS OR SWEATING *NAUSEA AND VOMITING THAT IS NOT CONTROLLED WITH YOUR NAUSEA MEDICATION *UNUSUAL SHORTNESS OF BREATH *UNUSUAL BRUISING OR BLEEDING *URINARY PROBLEMS (pain or burning when urinating, or frequent urination) *BOWEL PROBLEMS (unusual diarrhea, constipation, pain near the anus) TENDERNESS IN MOUTH AND THROAT WITH OR WITHOUT PRESENCE OF ULCERS (sore throat, sores in mouth, or a toothache) UNUSUAL RASH, SWELLING OR PAIN  UNUSUAL VAGINAL DISCHARGE OR ITCHING   Items with * indicate a potential emergency and should be followed up as soon as possible or go to the Emergency Department if any problems should occur.  Please show the CHEMOTHERAPY ALERT CARD or IMMUNOTHERAPY ALERT CARD at check-in to the Emergency  Department and triage nurse.  Should you have questions after your visit or need to cancel or reschedule your appointment, please contact [facility] [phone]  and follow the prompts.  Office hours are 8:00 a.m. to 4:30 p.m. Monday - Friday. Please note that voicemails left after 4:00 p.m. may not be returned until the following business day.  We are closed weekends and major holidays. You have access to a nurse at all times for urgent questions. Please call the main number to the clinic [phone] and follow the prompts.  For any non-urgent questions, you may also contact your provider using MyChart. We now offer e-Visits for anyone 22 and older to request care online for non-urgent symptoms. For details visit [email or web address].   Also download the MyChart app! Go to the app store, search "MyChart", open the app, select , and log in with your MyChart username and password.  Masks are optional in the cancer centers. If you would like for your care team to wear a mask while they are taking care of you, please let them know. For doctor visits, patients may have with them one support person who is at least 67 years old. At this time, visitors are not allowed in the infusion area.

## 2022-04-04 NOTE — Progress Notes (Signed)
Patient presents today for Zometa infusion per providers order.  Vital signs within parameters for treatment.  Message received for Anastasio Champion RN/Dr. Delton Coombes patient okay for treatment.  Zometa given today per MD orders.  Stable during infusion without adverse affects.  Vital signs stable.  No complaints at this time.  Discharge from clinic ambulatory in stable condition.  Alert and oriented X 3.  Follow up with [facility] as scheduled.

## 2022-04-12 ENCOUNTER — Inpatient Hospital Stay: Payer: Medicare HMO | Attending: Hematology

## 2022-04-12 DIAGNOSIS — Z23 Encounter for immunization: Secondary | ICD-10-CM | POA: Diagnosis present

## 2022-04-12 DIAGNOSIS — C9000 Multiple myeloma not having achieved remission: Secondary | ICD-10-CM | POA: Diagnosis not present

## 2022-04-12 MED ORDER — PNEUMOCOCCAL VAC POLYVALENT 25 MCG/0.5ML IJ INJ
0.5000 mL | INJECTION | Freq: Once | INTRAMUSCULAR | Status: AC
  Administered 2022-04-12: 0.5 mL via INTRAMUSCULAR
  Filled 2022-04-12: qty 0.5

## 2022-04-12 MED ORDER — MEASLES, MUMPS & RUBELLA VAC IJ SOLR
0.5000 mL | Freq: Once | INTRAMUSCULAR | Status: AC
  Administered 2022-04-12: 0.5 mL via SUBCUTANEOUS
  Filled 2022-04-12: qty 0.5

## 2022-04-12 NOTE — Progress Notes (Signed)
Patient tolerated injection with no complaints voiced.  Site clean and dry with no bruising or swelling noted at site.  See MAR for details.  Band aid applied.  Patient stable during and after injection.  Vss with discharge and left in satisfactory condition with no s/s of distress noted.  

## 2022-04-12 NOTE — Patient Instructions (Signed)
[facility]  Discharge Instructions: Thank you for choosing [facility] to provide your oncology and hematology care.  If you have a lab appointment with the [facility], please come in thru the Main Entrance and check in at the main information desk.  Wear comfortable clothing and clothing appropriate for easy access to any Portacath or PICC line.   We strive to give you quality time with your provider. You may need to reschedule your appointment if you arrive late (15 or more minutes).  Arriving late affects you and other patients whose appointments are after yours.  Also, if you miss three or more appointments without notifying the office, you may be dismissed from the clinic at the provider's discretion.      For prescription refill requests, have your pharmacy contact our office and allow 72 hours for refills to be completed.    MMR and Pneumovax given today.     To help prevent nausea and vomiting after your treatment, we encourage you to take your nausea medication as directed.  BELOW ARE SYMPTOMS THAT SHOULD BE REPORTED IMMEDIATELY: *FEVER GREATER THAN 100.4 F (38 C) OR HIGHER *CHILLS OR SWEATING *NAUSEA AND VOMITING THAT IS NOT CONTROLLED WITH YOUR NAUSEA MEDICATION *UNUSUAL SHORTNESS OF BREATH *UNUSUAL BRUISING OR BLEEDING *URINARY PROBLEMS (pain or burning when urinating, or frequent urination) *BOWEL PROBLEMS (unusual diarrhea, constipation, pain near the anus) TENDERNESS IN MOUTH AND THROAT WITH OR WITHOUT PRESENCE OF ULCERS (sore throat, sores in mouth, or a toothache) UNUSUAL RASH, SWELLING OR PAIN  UNUSUAL VAGINAL DISCHARGE OR ITCHING   Items with * indicate a potential emergency and should be followed up as soon as possible or go to the Emergency Department if any problems should occur.  Please show the CHEMOTHERAPY ALERT CARD or IMMUNOTHERAPY ALERT CARD at check-in to the Emergency Department and triage nurse.  Should you have  questions after your visit or need to cancel or reschedule your appointment, please contact [facility] [phone]  and follow the prompts.  Office hours are 8:00 a.m. to 4:30 p.m. Monday - Friday. Please note that voicemails left after 4:00 p.m. may not be returned until the following business day.  We are closed weekends and major holidays. You have access to a nurse at all times for urgent questions. Please call the main number to the clinic [phone] and follow the prompts.  For any non-urgent questions, you may also contact your provider using MyChart. We now offer e-Visits for anyone 73 and older to request care online for non-urgent symptoms. For details visit [email or web address].   Also download the MyChart app! Go to the app store, search "MyChart", open the app, select [facility], and log in with your MyChart username and password.  Masks are optional in the cancer centers. If you would like for your care team to wear a mask while they are taking care of you, please let them know. For doctor visits, patients may have with them one support person who is at least 67 years old. At this time, visitors are not allowed in the infusion area.

## 2022-04-26 ENCOUNTER — Other Ambulatory Visit: Payer: Self-pay

## 2022-04-26 DIAGNOSIS — C9001 Multiple myeloma in remission: Secondary | ICD-10-CM

## 2022-04-26 MED ORDER — LENALIDOMIDE 10 MG PO CAPS: ORAL_CAPSULE | ORAL | 0 refills | Status: DC

## 2022-04-26 NOTE — Telephone Encounter (Signed)
Chart reviewed. Revlimid refilled per last office note with Dr. Katragadda.  

## 2022-05-30 ENCOUNTER — Other Ambulatory Visit (HOSPITAL_COMMUNITY): Payer: Self-pay | Admitting: Family Medicine

## 2022-05-30 ENCOUNTER — Other Ambulatory Visit: Payer: Self-pay

## 2022-05-30 DIAGNOSIS — Z1231 Encounter for screening mammogram for malignant neoplasm of breast: Secondary | ICD-10-CM

## 2022-05-30 DIAGNOSIS — C9001 Multiple myeloma in remission: Secondary | ICD-10-CM

## 2022-05-30 MED ORDER — LENALIDOMIDE 10 MG PO CAPS: ORAL_CAPSULE | ORAL | 0 refills | Status: DC

## 2022-05-30 NOTE — Progress Notes (Signed)
Chart reviewed. Revlimid refilled per last office note with Dr. Katragadda.  

## 2022-06-20 ENCOUNTER — Inpatient Hospital Stay: Payer: Medicare HMO | Attending: Hematology

## 2022-06-20 DIAGNOSIS — Z79899 Other long term (current) drug therapy: Secondary | ICD-10-CM | POA: Insufficient documentation

## 2022-06-20 DIAGNOSIS — Z818 Family history of other mental and behavioral disorders: Secondary | ICD-10-CM | POA: Insufficient documentation

## 2022-06-20 DIAGNOSIS — Z8249 Family history of ischemic heart disease and other diseases of the circulatory system: Secondary | ICD-10-CM | POA: Insufficient documentation

## 2022-06-20 DIAGNOSIS — Z833 Family history of diabetes mellitus: Secondary | ICD-10-CM | POA: Diagnosis not present

## 2022-06-20 DIAGNOSIS — Z7961 Long term (current) use of immunomodulator: Secondary | ICD-10-CM | POA: Insufficient documentation

## 2022-06-20 DIAGNOSIS — R200 Anesthesia of skin: Secondary | ICD-10-CM | POA: Insufficient documentation

## 2022-06-20 DIAGNOSIS — M549 Dorsalgia, unspecified: Secondary | ICD-10-CM | POA: Diagnosis not present

## 2022-06-20 DIAGNOSIS — Z800 Family history of malignant neoplasm of digestive organs: Secondary | ICD-10-CM | POA: Insufficient documentation

## 2022-06-20 DIAGNOSIS — C9000 Multiple myeloma not having achieved remission: Secondary | ICD-10-CM | POA: Diagnosis present

## 2022-06-20 DIAGNOSIS — Z853 Personal history of malignant neoplasm of breast: Secondary | ICD-10-CM | POA: Insufficient documentation

## 2022-06-20 DIAGNOSIS — Z809 Family history of malignant neoplasm, unspecified: Secondary | ICD-10-CM | POA: Diagnosis not present

## 2022-06-20 DIAGNOSIS — G8929 Other chronic pain: Secondary | ICD-10-CM | POA: Diagnosis not present

## 2022-06-20 DIAGNOSIS — Z8379 Family history of other diseases of the digestive system: Secondary | ICD-10-CM | POA: Diagnosis not present

## 2022-06-20 DIAGNOSIS — G629 Polyneuropathy, unspecified: Secondary | ICD-10-CM | POA: Diagnosis not present

## 2022-06-20 DIAGNOSIS — E876 Hypokalemia: Secondary | ICD-10-CM | POA: Diagnosis not present

## 2022-06-20 LAB — CBC WITH DIFFERENTIAL/PLATELET
Abs Immature Granulocytes: 0 10*3/uL (ref 0.00–0.07)
Basophils Absolute: 0 10*3/uL (ref 0.0–0.1)
Basophils Relative: 1 %
Eosinophils Absolute: 0.1 10*3/uL (ref 0.0–0.5)
Eosinophils Relative: 4 %
HCT: 36.7 % (ref 36.0–46.0)
Hemoglobin: 12 g/dL (ref 12.0–15.0)
Immature Granulocytes: 0 %
Lymphocytes Relative: 25 %
Lymphs Abs: 0.8 10*3/uL (ref 0.7–4.0)
MCH: 31 pg (ref 26.0–34.0)
MCHC: 32.7 g/dL (ref 30.0–36.0)
MCV: 94.8 fL (ref 80.0–100.0)
Monocytes Absolute: 0.3 10*3/uL (ref 0.1–1.0)
Monocytes Relative: 11 %
Neutro Abs: 1.8 10*3/uL (ref 1.7–7.7)
Neutrophils Relative %: 59 %
Platelets: 187 10*3/uL (ref 150–400)
RBC: 3.87 MIL/uL (ref 3.87–5.11)
RDW: 15 % (ref 11.5–15.5)
WBC: 3.1 10*3/uL — ABNORMAL LOW (ref 4.0–10.5)
nRBC: 0 % (ref 0.0–0.2)

## 2022-06-20 LAB — COMPREHENSIVE METABOLIC PANEL
ALT: 43 U/L (ref 0–44)
AST: 44 U/L — ABNORMAL HIGH (ref 15–41)
Albumin: 3.6 g/dL (ref 3.5–5.0)
Alkaline Phosphatase: 94 U/L (ref 38–126)
Anion gap: 7 (ref 5–15)
BUN: 15 mg/dL (ref 8–23)
CO2: 28 mmol/L (ref 22–32)
Calcium: 8.8 mg/dL — ABNORMAL LOW (ref 8.9–10.3)
Chloride: 104 mmol/L (ref 98–111)
Creatinine, Ser: 0.83 mg/dL (ref 0.44–1.00)
GFR, Estimated: >60 mL/min (ref >60)
Glucose, Bld: 86 mg/dL (ref 70–99)
Potassium: 3.5 mmol/L (ref 3.5–5.1)
Sodium: 139 mmol/L (ref 135–145)
Total Bilirubin: 0.8 mg/dL (ref 0.3–1.2)
Total Protein: 7.4 g/dL (ref 6.5–8.1)

## 2022-06-20 LAB — MAGNESIUM: Magnesium: 1.9 mg/dL (ref 1.7–2.4)

## 2022-06-20 LAB — LACTATE DEHYDROGENASE: LDH: 118 U/L (ref 98–192)

## 2022-06-21 LAB — KAPPA/LAMBDA LIGHT CHAINS
Kappa free light chain: 45.5 mg/L — ABNORMAL HIGH (ref 3.3–19.4)
Kappa, lambda light chain ratio: 1.64 (ref 0.26–1.65)
Lambda free light chains: 27.7 mg/L — ABNORMAL HIGH (ref 5.7–26.3)

## 2022-06-22 ENCOUNTER — Other Ambulatory Visit: Payer: Self-pay

## 2022-06-22 DIAGNOSIS — C9001 Multiple myeloma in remission: Secondary | ICD-10-CM

## 2022-06-22 MED ORDER — LENALIDOMIDE 10 MG PO CAPS: ORAL_CAPSULE | ORAL | 0 refills | Status: DC

## 2022-06-22 NOTE — Telephone Encounter (Signed)
Chart reviewed. Revlimid refilled per last office note with Dr. Katragadda.  

## 2022-06-25 LAB — PROTEIN ELECTROPHORESIS, SERUM
A/G Ratio: 1.1 (ref 0.7–1.7)
Albumin ELP: 3.6 g/dL (ref 2.9–4.4)
Alpha-1-Globulin: 0.2 g/dL (ref 0.0–0.4)
Alpha-2-Globulin: 0.7 g/dL (ref 0.4–1.0)
Beta Globulin: 1.1 g/dL (ref 0.7–1.3)
Gamma Globulin: 1.2 g/dL (ref 0.4–1.8)
Globulin, Total: 3.3 g/dL (ref 2.2–3.9)
Total Protein ELP: 6.9 g/dL (ref 6.0–8.5)

## 2022-06-26 ENCOUNTER — Other Ambulatory Visit: Payer: Self-pay

## 2022-06-26 DIAGNOSIS — C9001 Multiple myeloma in remission: Secondary | ICD-10-CM

## 2022-06-26 MED ORDER — LENALIDOMIDE 10 MG PO CAPS: ORAL_CAPSULE | ORAL | 0 refills | Status: DC

## 2022-06-27 ENCOUNTER — Inpatient Hospital Stay: Payer: Medicare HMO | Admitting: Hematology

## 2022-06-27 ENCOUNTER — Inpatient Hospital Stay: Payer: Medicare HMO

## 2022-06-27 VITALS — HR 67 | Temp 97.9°F | Resp 17 | Ht 66.0 in | Wt 157.0 lb

## 2022-06-27 VITALS — BP 134/69 | HR 63 | Temp 96.9°F | Resp 18

## 2022-06-27 DIAGNOSIS — C9000 Multiple myeloma not having achieved remission: Secondary | ICD-10-CM | POA: Diagnosis not present

## 2022-06-27 DIAGNOSIS — C9001 Multiple myeloma in remission: Secondary | ICD-10-CM

## 2022-06-27 LAB — IMMUNOFIXATION ELECTROPHORESIS
IgA: 202 mg/dL (ref 87–352)
IgG (Immunoglobin G), Serum: 1184 mg/dL (ref 586–1602)
IgM (Immunoglobulin M), Srm: 43 mg/dL (ref 26–217)
Total Protein ELP: 6.8 g/dL (ref 6.0–8.5)

## 2022-06-27 MED ORDER — ZOLEDRONIC ACID 4 MG/100ML IV SOLN
4.0000 mg | Freq: Once | INTRAVENOUS | Status: AC
  Administered 2022-06-27: 4 mg via INTRAVENOUS
  Filled 2022-06-27: qty 100

## 2022-06-27 MED ORDER — SODIUM CHLORIDE 0.9 % IV SOLN: Freq: Once | INTRAVENOUS | Status: AC

## 2022-06-27 NOTE — Progress Notes (Signed)
[place], [place] [address]   CLINIC:  Medical Oncology/Hematology  PCP:  [name], MD [place] [address] [phone]   REASON FOR VISIT:  Follow-up for multiple myeloma  PRIOR THERAPY:  1. RVD x 6 cycles from 08/19/2019 to 12/15/2019. 2. 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  CURRENT THERAPY: Revlimid 10 mg daily; Zometa every 12 weeks  BRIEF ONCOLOGIC HISTORY:  Oncology History  Multiple myeloma ([facility])  08/22/2019 Initial Diagnosis   Multiple myeloma ([facility])   08/22/2019 Cancer Staging   Staging form: Plasma Cell Myeloma and Plasma Cell Disorders, AJCC 8th Edition - Clinical stage from 08/22/2019: RISS Stage II (Beta-2-microglobulin (mg/L): 3.8, Albumin (g/dL): 3.2, ISS: Stage II, High-risk cytogenetics: Absent, LDH: Normal) - Signed by Derek Jack, MD on 08/22/2019     CANCER STAGING:  Cancer Staging  Multiple myeloma ([facility]) Staging form: Plasma Cell Myeloma and Plasma Cell Disorders, AJCC 8th Edition - Clinical stage from 08/22/2019: RISS Stage II (Beta-2-microglobulin (mg/L): 3.8, Albumin (g/dL): 3.2, ISS: Stage II, High-risk cytogenetics: Absent, LDH: Normal) - Signed by Derek Jack, MD on 08/22/2019   INTERVAL HISTORY:  [name], a 66 y.o. female, seen for follow-up of multiple myeloma.  Denies any new onset pains.  Chronic back pain is rated as 5 out of 10 and slightly better from last visit.  Denies any tingling or numbness in the extremities.  No infections or hospitalizations reported.  REVIEW OF SYSTEMS:  Review of Systems  Constitutional:  Negative for appetite change and fatigue.  Respiratory:  Negative for cough.   Gastrointestinal:  Negative for diarrhea, nausea and vomiting.  Musculoskeletal:  Positive for back pain (4/10).  Neurological:  Positive for numbness.  All other systems reviewed and are negative.   PAST MEDICAL/SURGICAL HISTORY:  Past Medical History:   Diagnosis Date   Breast cancer ([facility]) 2006   left breast   Breast mass, right    Compression fracture of cervical spine ([facility]) 06/18/2019   recent Chest xray stating had fx.   High cholesterol    History of bladder infections    Hypertension    Rash Aug 2013   Past Surgical History:  Procedure Laterality Date   ABDOMINAL HYSTERECTOMY     just uterus removed   BREAST BIOPSY     BREAST LUMPECTOMY WITH RADIOACTIVE SEED LOCALIZATION Right 07/24/2018   Procedure: RIGHT BREAST LUMPECTOMY WITH RADIOACTIVE SEED LOCALIZATION;  Surgeon: Coralie Keens, MD;  Location: [facility];  Service: General;  Laterality: Right;   IR RADIOLOGIST EVAL & MGMT  07/28/2020   MASTECTOMY  5/06   left   PARTIAL HYSTERECTOMY     PORT-A-CATH REMOVAL     PORTACATH PLACEMENT      SOCIAL HISTORY:  Social History   Socioeconomic History   Marital status: Married    Spouse name: Not on file   Number of children: 2   Years of education: Not on file   Highest education level: Not on file  Occupational History    Employer: [employer]  Tobacco Use   Smoking status: Never   Smokeless tobacco: Never  Vaping Use   Vaping Use: Never used  Substance and Sexual Activity   Alcohol use: No   Drug use: No   Sexual activity: Yes    Birth control/protection: Surgical  Other Topics Concern   Not on file  Social History Narrative   Not on file   Social Determinants of Health  Financial Resource Strain: Low Risk  (07/20/2020)   Overall Financial Resource Strain (CARDIA)    Difficulty of Paying Living Expenses: Not hard at all  Food Insecurity: No Food Insecurity (07/20/2020)   Hunger Vital Sign    Worried About Running Out of Food in the Last Year: Never true    Ran Out of Food in the Last Year: Never true  Transportation Needs: No Transportation Needs (07/20/2020)   PRAPARE - Hydrologist (Medical): No    Lack of Transportation (Non-Medical): No  Physical  Activity: Inactive (07/20/2020)   Exercise Vital Sign    Days of Exercise per Week: 0 days    Minutes of Exercise per Session: 0 min  Stress: No Stress Concern Present (07/20/2020)   [place]    Feeling of Stress : Not at all  Social Connections: Moderately Integrated (07/20/2020)   Social Connection and Isolation Panel [NHANES]    Frequency of Communication with Friends and Family: More than three times a week    Frequency of Social Gatherings with Friends and Family: Never    Attends Religious Services: More than 4 times per year    Active Member of [employer] or Organizations: No    Attends Archivist Meetings: Never    Marital Status: Married  Human resources officer Violence: Not At Risk (07/20/2020)   Humiliation, Afraid, Rape, and Kick questionnaire    Fear of Current or Ex-Partner: No    Emotionally Abused: No    Physically Abused: No    Sexually Abused: No    FAMILY HISTORY:  Family History  Problem Relation Age of Onset   Dementia Mother    Stomach cancer Father    Cancer Brother    Cancer Brother    Diabetes Brother    Colitis Daughter    Hypertension Daughter    Hypertension Daughter     CURRENT MEDICATIONS:  Current Outpatient Medications  Medication Sig Dispense Refill   aspirin EC 81 MG tablet Take 81 mg by mouth daily. Swallow whole.     calcium carbonate (TUMS - DOSED IN MG ELEMENTAL CALCIUM) 500 MG chewable tablet Chew 2 tablets by mouth 2 (two) times daily.     gabapentin (NEURONTIN) 300 MG capsule Take 1 capsule (300 mg total) by mouth daily. 30 capsule 3   hydrochlorothiazide (HYDRODIURIL) 25 MG tablet Take 25 mg by mouth daily.     HYDROcodone-acetaminophen (NORCO) 10-325 MG tablet Take 1 tablet by mouth 2 (two) times daily as needed for moderate pain. 45 tablet 0   lenalidomide (REVLIMID) 10 MG capsule TAKE 1 CAPSULE BY MOUTH EVERY DAY FOR 3 WEEKS ON THEN 1 WEEK OFF FOR A 28 DAY SUPPLY  21 capsule 0   magnesium oxide (MAG-OX) 400 (241.3 Mg) MG tablet Take 1 tablet (400 mg total) by mouth daily. 60 tablet 3   magnesium oxide (MAG-OX) 400 MG tablet Take 1 tablet by mouth daily.     potassium chloride (KLOR-CON) 10 MEQ tablet Take 2 tablets (20 mEq total) by mouth 2 (two) times daily. 180 tablet 3   Vitamin D, Ergocalciferol, (DRISDOL) 1.25 MG (50000 UNIT) CAPS capsule TAKE 1 CAPSULE BY MOUTH ONE TIME PER WEEK 12 capsule 1   No current facility-administered medications for this visit.    ALLERGIES:  No Known Allergies  PHYSICAL EXAM:  Performance status (ECOG): 1 - Symptomatic but completely ambulatory  Vitals:   06/27/22 1315  Pulse: 67  Resp: 17  Temp: 97.9 F (36.6 C)  SpO2: 100%   Wt Readings from Last 3 Encounters:  06/27/22 157 lb (71.2 kg)  04/04/22 156 lb 3.2 oz (70.9 kg)  01/03/22 155 lb 10.3 oz (70.6 kg)   Physical Exam Vitals reviewed.  Constitutional:      Appearance: Normal appearance.  Cardiovascular:     Rate and Rhythm: Normal rate and regular rhythm.     Pulses: Normal pulses.     Heart sounds: Normal heart sounds.  Pulmonary:     Effort: Pulmonary effort is normal.     Breath sounds: Normal breath sounds.  Neurological:     General: No focal deficit present.     Mental Status: She is alert and oriented to person, place, and time.  Psychiatric:        Mood and Affect: Mood normal.        Behavior: Behavior normal.     LABORATORY DATA:  I have reviewed the labs as listed.     Latest Ref Rng & Units 06/20/2022   10:53 AM 03/28/2022    2:32 PM 12/27/2021    1:30 PM  CBC  WBC 4.0 - 10.5 K/uL 3.1  2.7  4.6   Hemoglobin 12.0 - 15.0 g/dL 12.0  11.7  11.5   Hematocrit 36.0 - 46.0 % 36.7  36.1  35.4   Platelets 150 - 400 K/uL 187  197  204       Latest Ref Rng & Units 06/20/2022   10:53 AM 03/28/2022    2:32 PM 12/27/2021    1:30 PM  CMP  Glucose 70 - 99 mg/dL 86  92  94   BUN 8 - 23 mg/dL 15  17  21    Creatinine 0.44 - 1.00 mg/dL  0.83  0.89  0.80   Sodium 135 - 145 mmol/L 139  138  138   Potassium 3.5 - 5.1 mmol/L 3.5  3.4  3.2   Chloride 98 - 111 mmol/L 104  102  102   CO2 22 - 32 mmol/L 28  28  29    Calcium 8.9 - 10.3 mg/dL 8.8  9.3  9.1   Total Protein 6.5 - 8.1 g/dL 7.4  7.4  7.2   Total Bilirubin 0.3 - 1.2 mg/dL 0.8  0.8  0.7   Alkaline Phos 38 - 126 U/L 94  103  113   AST 15 - 41 U/L 44  27  28   ALT 0 - 44 U/L 43  33  33     DIAGNOSTIC IMAGING:  I have independently reviewed the scans and discussed with the patient. No results found.   ASSESSMENT:  1. 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  Revlimid started on 10/06/2019 due to delay. -Velcade held since 12/15/2019 due to gastric symptoms, which have resolved completely. -She was evaluated by Dr. Samule Ohm at [facility].  Recommended to have immediate bone marrow transplant. -She has compression fractures of T8, T10 and T12.  Also L1 and L3 compression fractures. -Revlimid and dexamethasone continued until 03/22/2020. 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 -Revlimid 10 mg daily started around first week of December 2021. - Revlimid dose decreased to 10 mg 3 weeks on 1 week off on 01/04/2021 due to worsening back pain.   2.  Upper/mid back pain: -MRI on 07/13/2020 shows T7, T8, T12, L1 and L2 compression/superior endplate  fractures.  Height loss is greatest and moderate at T7 and T8.  No evidence of bone lesions. - She was previously evaluated by IR for vertebroplasty which was not recommended.   PLAN:  1.  IgA lambda plasma cell myeloma: - She is tolerating Revlimid well. - Reviewed labs from 06/20/2022 which showed M spike is not detected.  LFTs show elevated AST.  Creatinine and calcium are normal.  CBC shows mild leukopenia which is stable.  Free light chain ratio is 1.64.  Quantitative immunoglobulins are normal.  Immunofixation was negative. -  Continue Revlimid 10 mg 3 weeks on/1 week off maintenance. - RTC 3 months for follow-up.   2.  Hypokalemia: - Continue potassium 20 mg twice daily.  Potassium today is 3.5.   3.  Bone protection: - Continue Zometa today and every 12 weeks until April 2024.   4.  Thromboprophylaxis: - Continue aspirin daily.   5.  Neuropathy: - Neuropathy in the feet has been stable.   6.  Hypomagnesemia: - Continue magnesium every other day.  Magnesium level is normal.   Orders placed this encounter:  No orders of the defined types were placed in this encounter.    Derek Jack, MD [place] [phone]

## 2022-06-27 NOTE — Patient Instructions (Signed)
[place] at [facility] Discharge Instructions   You were seen and examined today by Dr. Delton Coombes.  He reviewed the results of your lab work which are normal/stable.   Continue Revlimid as prescribed.   You should get your flu vaccine, RSV vaccine, and the newest Covid vaccine.   We will give your Zometa today.  Return as scheduled in 3 months.    Thank you for choosing [place] at [facility] to provide your oncology and hematology care.  To afford each patient quality time with our provider, please arrive at least 15 minutes before your scheduled appointment time.   If you have a lab appointment with the [facility] please come in thru the Main Entrance and check in at the main information desk.  You need to re-schedule your appointment should you arrive 10 or more minutes late.  We strive to give you quality time with our providers, and arriving late affects you and other patients whose appointments are after yours.  Also, if you no show three or more times for appointments you may be dismissed from the clinic at the providers discretion.     Again, thank you for choosing [facility].  Our hope is that these requests will decrease the amount of time that you wait before being seen by our physicians.       _____________________________________________________________  Should you have questions after your visit to [facility], please contact our office at [phone] and follow the prompts.  Our office hours are 8:00 a.m. and 4:30 p.m. Monday - Friday.  Please note that voicemails left after 4:00 p.m. may not be returned until the following business day.  We are closed weekends and major holidays.  You do have access to a nurse 24-7, just call the main number to the clinic [phone] and do not press any options, hold on the line and a nurse will answer the phone.    For prescription refill requests, have  your pharmacy contact our office and allow 72 hours.    Due to Covid, you will need to wear a mask upon entering the hospital. If you do not have a mask, a mask will be given to you at the Main Entrance upon arrival. For doctor visits, patients may have 1 support person age 62 or older with them. For treatment visits, patients can not have anyone with them due to social distancing guidelines and our immunocompromised population.

## 2022-06-27 NOTE — Progress Notes (Signed)
Patient is taking Revlimid as prescribed.  She has not missed any doses and reports no side effects at this time.   

## 2022-06-27 NOTE — Patient Instructions (Signed)
[facility]  Discharge Instructions: Thank you for choosing [facility] to provide your oncology and hematology care.  If you have a lab appointment with the [facility], please come in thru the Main Entrance and check in at the main information desk.  Wear comfortable clothing and clothing appropriate for easy access to any Portacath or PICC line.   We strive to give you quality time with your provider. You may need to reschedule your appointment if you arrive late (15 or more minutes).  Arriving late affects you and other patients whose appointments are after yours.  Also, if you miss three or more appointments without notifying the office, you may be dismissed from the clinic at the provider's discretion.      For prescription refill requests, have your pharmacy contact our office and allow 72 hours for refills to be completed.    Today you received the following Zometa, return as scheduled.   To help prevent nausea and vomiting after your treatment, we encourage you to take your nausea medication as directed.  BELOW ARE SYMPTOMS THAT SHOULD BE REPORTED IMMEDIATELY: *FEVER GREATER THAN 100.4 F (38 C) OR HIGHER *CHILLS OR SWEATING *NAUSEA AND VOMITING THAT IS NOT CONTROLLED WITH YOUR NAUSEA MEDICATION *UNUSUAL SHORTNESS OF BREATH *UNUSUAL BRUISING OR BLEEDING *URINARY PROBLEMS (pain or burning when urinating, or frequent urination) *BOWEL PROBLEMS (unusual diarrhea, constipation, pain near the anus) TENDERNESS IN MOUTH AND THROAT WITH OR WITHOUT PRESENCE OF ULCERS (sore throat, sores in mouth, or a toothache) UNUSUAL RASH, SWELLING OR PAIN  UNUSUAL VAGINAL DISCHARGE OR ITCHING   Items with * indicate a potential emergency and should be followed up as soon as possible or go to the Emergency Department if any problems should occur.  Please show the CHEMOTHERAPY ALERT CARD or IMMUNOTHERAPY ALERT CARD at check-in to the Emergency Department and triage  nurse.  Should you have questions after your visit or need to cancel or reschedule your appointment, please contact [facility] [phone]  and follow the prompts.  Office hours are 8:00 a.m. to 4:30 p.m. Monday - Friday. Please note that voicemails left after 4:00 p.m. may not be returned until the following business day.  We are closed weekends and major holidays. You have access to a nurse at all times for urgent questions. Please call the main number to the clinic [phone] and follow the prompts.  For any non-urgent questions, you may also contact your provider using MyChart. We now offer e-Visits for anyone 86 and older to request care online for non-urgent symptoms. For details visit [email or web address].   Also download the MyChart app! Go to the app store, search "MyChart", open the app, select [facility], and log in with your MyChart username and password.  Masks are optional in the cancer centers. If you would like for your care team to wear a mask while they are taking care of you, please let them know. You may have one support person who is at least 67 years old accompany you for your appointments.

## 2022-06-27 NOTE — Progress Notes (Signed)
Patient tolerated therapy with no complaints voiced. Side effects with management reviewed with understanding verbalized. IV site clean and dry with no bruising or swelling noted at site. Good blood return noted before and after administration of therapy. Band aid applied. Patient left in satisfactory condition with VSS and no s/s of distress noted.  

## 2022-07-12 ENCOUNTER — Ambulatory Visit (HOSPITAL_COMMUNITY)
Admission: RE | Admit: 2022-07-12 | Discharge: 2022-07-12 | Disposition: A | Discharge status: Home / Self Care | Payer: Medicare HMO | Source: Ambulatory Visit | Attending: Family Medicine | Admitting: Family Medicine

## 2022-07-12 ENCOUNTER — Other Ambulatory Visit (HOSPITAL_COMMUNITY): Payer: Self-pay | Admitting: Family Medicine

## 2022-07-12 DIAGNOSIS — Z1231 Encounter for screening mammogram for malignant neoplasm of breast: Secondary | ICD-10-CM

## 2022-07-19 ENCOUNTER — Other Ambulatory Visit: Payer: Self-pay

## 2022-07-19 DIAGNOSIS — C9001 Multiple myeloma in remission: Secondary | ICD-10-CM

## 2022-07-19 MED ORDER — LENALIDOMIDE 10 MG PO CAPS: ORAL_CAPSULE | ORAL | 0 refills | Status: DC

## 2022-07-19 NOTE — Telephone Encounter (Signed)
Chart reviewed. Revlimid refilled per last office note with Dr. Katragadda.  

## 2022-07-20 ENCOUNTER — Encounter (HOSPITAL_COMMUNITY): Payer: Self-pay | Admitting: Hematology

## 2022-07-20 ENCOUNTER — Other Ambulatory Visit (HOSPITAL_COMMUNITY): Payer: Self-pay

## 2022-07-20 ENCOUNTER — Telehealth: Payer: Self-pay

## 2022-07-20 NOTE — Telephone Encounter (Signed)
Oral Oncology Patient Advocate Encounter  Was successful in securing patient a $12,000 grant from [facility] to provide copayment coverage for Revlimid.  This will keep the out of pocket expense at $0.     [employer] ID: [id number]  I have spoken with the patient.   The billing information is as follows and has been shared with [facility].    RxBin: [id number] PCN: [id number] Member ID: [id number] Group ID: [id number] Dates of Eligibility: 10.11.23 through 10.10.24  Fund:  Multiple Myeloma - Medicare [place], [facility] Patient Stephanie Galvan  [phone] (phone) [phone] (fax) 07/20/2022 1:19 PM

## 2022-07-31 ENCOUNTER — Other Ambulatory Visit: Payer: Self-pay | Admitting: *Deleted

## 2022-07-31 DIAGNOSIS — C9000 Multiple myeloma not having achieved remission: Secondary | ICD-10-CM

## 2022-07-31 MED ORDER — POTASSIUM CHLORIDE ER 10 MEQ PO TBCR: 20.0000 meq | EXTENDED_RELEASE_TABLET | Freq: Two times a day (BID) | ORAL | 3 refills | Status: DC

## 2022-08-13 ENCOUNTER — Other Ambulatory Visit: Payer: Self-pay

## 2022-08-13 DIAGNOSIS — C9001 Multiple myeloma in remission: Secondary | ICD-10-CM

## 2022-08-13 MED ORDER — LENALIDOMIDE 10 MG PO CAPS: ORAL_CAPSULE | ORAL | 0 refills | Status: DC

## 2022-08-13 NOTE — Telephone Encounter (Signed)
Chart reviewed. Revlimid refilled per last office note with Dr. Katragadda.  

## 2022-09-07 ENCOUNTER — Other Ambulatory Visit: Payer: Self-pay

## 2022-09-07 MED ORDER — VITAMIN D (ERGOCALCIFEROL) 1.25 MG (50000 UNIT) PO CAPS: ORAL_CAPSULE | ORAL | 1 refills | Status: DC

## 2022-09-12 ENCOUNTER — Telehealth: Payer: Self-pay

## 2022-09-12 ENCOUNTER — Telehealth: Payer: Self-pay | Admitting: Pharmacist

## 2022-09-12 ENCOUNTER — Inpatient Hospital Stay: Payer: Medicare HMO | Attending: Hematology

## 2022-09-12 ENCOUNTER — Other Ambulatory Visit (HOSPITAL_COMMUNITY): Payer: Self-pay

## 2022-09-12 DIAGNOSIS — Z853 Personal history of malignant neoplasm of breast: Secondary | ICD-10-CM | POA: Insufficient documentation

## 2022-09-12 DIAGNOSIS — Z800 Family history of malignant neoplasm of digestive organs: Secondary | ICD-10-CM | POA: Diagnosis not present

## 2022-09-12 DIAGNOSIS — E876 Hypokalemia: Secondary | ICD-10-CM | POA: Insufficient documentation

## 2022-09-12 DIAGNOSIS — G629 Polyneuropathy, unspecified: Secondary | ICD-10-CM | POA: Insufficient documentation

## 2022-09-12 DIAGNOSIS — Z7982 Long term (current) use of aspirin: Secondary | ICD-10-CM | POA: Diagnosis not present

## 2022-09-12 DIAGNOSIS — R202 Paresthesia of skin: Secondary | ICD-10-CM | POA: Insufficient documentation

## 2022-09-12 DIAGNOSIS — R197 Diarrhea, unspecified: Secondary | ICD-10-CM | POA: Insufficient documentation

## 2022-09-12 DIAGNOSIS — Z809 Family history of malignant neoplasm, unspecified: Secondary | ICD-10-CM | POA: Insufficient documentation

## 2022-09-12 DIAGNOSIS — M549 Dorsalgia, unspecified: Secondary | ICD-10-CM | POA: Diagnosis not present

## 2022-09-12 DIAGNOSIS — C9000 Multiple myeloma not having achieved remission: Secondary | ICD-10-CM | POA: Diagnosis present

## 2022-09-12 DIAGNOSIS — Z79899 Other long term (current) drug therapy: Secondary | ICD-10-CM | POA: Diagnosis not present

## 2022-09-12 DIAGNOSIS — Z833 Family history of diabetes mellitus: Secondary | ICD-10-CM | POA: Insufficient documentation

## 2022-09-12 DIAGNOSIS — Z90710 Acquired absence of both cervix and uterus: Secondary | ICD-10-CM | POA: Diagnosis not present

## 2022-09-12 DIAGNOSIS — Z8249 Family history of ischemic heart disease and other diseases of the circulatory system: Secondary | ICD-10-CM | POA: Diagnosis not present

## 2022-09-12 DIAGNOSIS — Z8379 Family history of other diseases of the digestive system: Secondary | ICD-10-CM | POA: Diagnosis not present

## 2022-09-12 DIAGNOSIS — Z818 Family history of other mental and behavioral disorders: Secondary | ICD-10-CM | POA: Insufficient documentation

## 2022-09-12 DIAGNOSIS — Z7961 Long term (current) use of immunomodulator: Secondary | ICD-10-CM | POA: Diagnosis not present

## 2022-09-12 DIAGNOSIS — R200 Anesthesia of skin: Secondary | ICD-10-CM | POA: Insufficient documentation

## 2022-09-12 LAB — CBC WITH DIFFERENTIAL/PLATELET
Abs Immature Granulocytes: 0 10*3/uL (ref 0.00–0.07)
Basophils Absolute: 0 10*3/uL (ref 0.0–0.1)
Basophils Relative: 1 %
Eosinophils Absolute: 0.1 10*3/uL (ref 0.0–0.5)
Eosinophils Relative: 2 %
HCT: 36.1 % (ref 36.0–46.0)
Hemoglobin: 11.7 g/dL — ABNORMAL LOW (ref 12.0–15.0)
Immature Granulocytes: 0 %
Lymphocytes Relative: 28 %
Lymphs Abs: 0.9 10*3/uL (ref 0.7–4.0)
MCH: 30.8 pg (ref 26.0–34.0)
MCHC: 32.4 g/dL (ref 30.0–36.0)
MCV: 95 fL (ref 80.0–100.0)
Monocytes Absolute: 0.4 10*3/uL (ref 0.1–1.0)
Monocytes Relative: 12 %
Neutro Abs: 1.9 10*3/uL (ref 1.7–7.7)
Neutrophils Relative %: 57 %
Platelets: 184 10*3/uL (ref 150–400)
RBC: 3.8 MIL/uL — ABNORMAL LOW (ref 3.87–5.11)
RDW: 14.8 % (ref 11.5–15.5)
WBC: 3.2 10*3/uL — ABNORMAL LOW (ref 4.0–10.5)
nRBC: 0 % (ref 0.0–0.2)

## 2022-09-12 LAB — COMPREHENSIVE METABOLIC PANEL
ALT: 42 U/L (ref 0–44)
AST: 38 U/L (ref 15–41)
Albumin: 3.6 g/dL (ref 3.5–5.0)
Alkaline Phosphatase: 85 U/L (ref 38–126)
Anion gap: 6 (ref 5–15)
BUN: 18 mg/dL (ref 8–23)
CO2: 30 mmol/L (ref 22–32)
Calcium: 8.6 mg/dL — ABNORMAL LOW (ref 8.9–10.3)
Chloride: 100 mmol/L (ref 98–111)
Creatinine, Ser: 0.85 mg/dL (ref 0.44–1.00)
GFR, Estimated: >60 mL/min (ref >60)
Glucose, Bld: 79 mg/dL (ref 70–99)
Potassium: 3.1 mmol/L — ABNORMAL LOW (ref 3.5–5.1)
Sodium: 136 mmol/L (ref 135–145)
Total Bilirubin: 0.9 mg/dL (ref 0.3–1.2)
Total Protein: 7 g/dL (ref 6.5–8.1)

## 2022-09-12 LAB — MAGNESIUM: Magnesium: 1.9 mg/dL (ref 1.7–2.4)

## 2022-09-12 NOTE — Telephone Encounter (Signed)
Oral Oncology Patient Advocate Encounter  Prior Authorization for Lenalidomide has been approved.    PA# [id number]  Effective dates: 01.01.24 through 09/10/23  Patients co-pay is $4,181.91.    Berdine Addison, [facility] Patient [place]  [phone] (phone) [phone] (fax) 09/12/2022 10:41 AM

## 2022-09-12 NOTE — Telephone Encounter (Signed)
Oral Chemotherapy Pharmacist Encounter   Patient will now be using a grant to fill her Revlimid at [facility], instead of mfg assistance. Patient informed and provided with the phone number to [facility].     Her Revlimid will be available for refill later this week, her next refill for Revlimid should be sent the [facility]. Pharmacy added to her pharmacy list in [place].   Of note, patient did say she submitted on her own to re-enrollment application to [employer] mfg assistance but has not heard back.     Darl Pikes, PharmD, BCPS, BCOP, CPP Hematology/Oncology Clinical Pharmacist [place]/DB/AP [facility] [phone]  09/12/2022 10:19 AM

## 2022-09-12 NOTE — Telephone Encounter (Signed)
Oral Oncology Patient Advocate Encounter   Received notification that prior authorization for Lenalidomide is required.   PA submitted on 09/12/22  Key [id number]  Status is pending     Berdine Addison, [place] Patient [place]  [phone] (phone) [phone] (fax) 09/12/2022 10:11 AM

## 2022-09-13 LAB — KAPPA/LAMBDA LIGHT CHAINS
Kappa free light chain: 45.8 mg/L — ABNORMAL HIGH (ref 3.3–19.4)
Kappa, lambda light chain ratio: 1.53 (ref 0.26–1.65)
Lambda free light chains: 30 mg/L — ABNORMAL HIGH (ref 5.7–26.3)

## 2022-09-14 LAB — PROTEIN ELECTROPHORESIS, SERUM
A/G Ratio: 1.1 (ref 0.7–1.7)
Albumin ELP: 3.5 g/dL (ref 2.9–4.4)
Alpha-1-Globulin: 0.2 g/dL (ref 0.0–0.4)
Alpha-2-Globulin: 0.7 g/dL (ref 0.4–1.0)
Beta Globulin: 1.1 g/dL (ref 0.7–1.3)
Gamma Globulin: 1.2 g/dL (ref 0.4–1.8)
Globulin, Total: 3.2 g/dL (ref 2.2–3.9)
Total Protein ELP: 6.7 g/dL (ref 6.0–8.5)

## 2022-09-14 LAB — IMMUNOFIXATION ELECTROPHORESIS
IgA: 217 mg/dL (ref 87–352)
IgG (Immunoglobin G), Serum: 1206 mg/dL (ref 586–1602)
IgM (Immunoglobulin M), Srm: 45 mg/dL (ref 26–217)
Total Protein ELP: 6.5 g/dL (ref 6.0–8.5)

## 2022-09-17 ENCOUNTER — Other Ambulatory Visit: Payer: Self-pay

## 2022-09-17 DIAGNOSIS — C9001 Multiple myeloma in remission: Secondary | ICD-10-CM

## 2022-09-17 MED ORDER — LENALIDOMIDE 10 MG PO CAPS: ORAL_CAPSULE | ORAL | 0 refills | Status: DC

## 2022-09-17 NOTE — Telephone Encounter (Signed)
Chart reviewed. Revlimid refilled per last office note with Dr. Katragadda.  

## 2022-09-17 NOTE — Telephone Encounter (Signed)
Rx sent by Lilia Pro, RN to [facility] today. Supportive information faxed to [facility] today.

## 2022-09-19 ENCOUNTER — Inpatient Hospital Stay (HOSPITAL_BASED_OUTPATIENT_CLINIC_OR_DEPARTMENT_OTHER): Payer: Medicare HMO | Admitting: Hematology

## 2022-09-19 ENCOUNTER — Inpatient Hospital Stay: Payer: Medicare HMO

## 2022-09-19 VITALS — BP 147/99 | HR 85 | Temp 98.2°F | Resp 16 | Wt 155.6 lb

## 2022-09-19 VITALS — BP 139/72 | HR 72 | Temp 98.2°F | Resp 16

## 2022-09-19 DIAGNOSIS — C9000 Multiple myeloma not having achieved remission: Secondary | ICD-10-CM

## 2022-09-19 MED ORDER — ZOLEDRONIC ACID 4 MG/100ML IV SOLN
4.0000 mg | Freq: Once | INTRAVENOUS | Status: AC
  Administered 2022-09-19: 4 mg via INTRAVENOUS
  Filled 2022-09-19: qty 100

## 2022-09-19 MED ORDER — SODIUM CHLORIDE 0.9 % IV SOLN: Freq: Once | INTRAVENOUS | Status: AC

## 2022-09-19 NOTE — Patient Instructions (Addendum)
[place] at [facility] Discharge Instructions   You were seen and examined today by Dr. Delton Coombes.  He reviewed the results of your lab work. Your potassium is slightly low at 3.1. Continue taking potassium at home. Your myeloma labs are normal. All other results are normal/stable.   We will proceed with your Zometa today.   We will see you back in 3 months. We will repeat lab work one week prior to your next visit.    Thank you for choosing [place] at [facility] to provide your oncology and hematology care.  To afford each patient quality time with our provider, please arrive at least 15 minutes before your scheduled appointment time.   If you have a lab appointment with the [facility] please come in thru the Main Entrance and check in at the main information desk.  You need to re-schedule your appointment should you arrive 10 or more minutes late.  We strive to give you quality time with our providers, and arriving late affects you and other patients whose appointments are after yours.  Also, if you no show three or more times for appointments you may be dismissed from the clinic at the providers discretion.     Again, thank you for choosing [facility].  Our hope is that these requests will decrease the amount of time that you wait before being seen by our physicians.       _____________________________________________________________  Should you have questions after your visit to [facility], please contact our office at [phone] and follow the prompts.  Our office hours are 8:00 a.m. and 4:30 p.m. Monday - Friday.  Please note that voicemails left after 4:00 p.m. may not be returned until the following business day.  We are closed weekends and major holidays.  You do have access to a nurse 24-7, just call the main number to the clinic [phone] and do not press any options, hold on the line and a  nurse will answer the phone.    For prescription refill requests, have your pharmacy contact our office and allow 72 hours.    Due to Covid, you will need to wear a mask upon entering the hospital. If you do not have a mask, a mask will be given to you at the Main Entrance upon arrival. For doctor visits, patients may have 1 support person age 30 or older with them. For treatment visits, patients can not have anyone with them due to social distancing guidelines and our immunocompromised population.

## 2022-09-19 NOTE — Progress Notes (Signed)
[place], [place] [address]   CLINIC:  Medical Oncology/Hematology  PCP:  Stephanie Evens, MD [place] / [place] [address] [phone]   REASON FOR VISIT:  Follow-up for multiple myeloma  PRIOR THERAPY:  1. RVD x 6 cycles from 08/19/2019 to 12/15/2019. 2. 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  CURRENT THERAPY: Revlimid 10 mg daily; Zometa every 12 weeks  BRIEF ONCOLOGIC HISTORY:  Oncology History  Multiple myeloma ([facility])  08/22/2019 Initial Diagnosis   Multiple myeloma ([facility])   08/22/2019 Cancer Staging   Staging form: Plasma Cell Myeloma and Plasma Cell Disorders, AJCC 8th Edition - Clinical stage from 08/22/2019: RISS Stage II (Beta-2-microglobulin (mg/L): 3.8, Albumin (g/dL): 3.2, ISS: Stage II, High-risk cytogenetics: Absent, LDH: Normal) - Signed by Derek Jack, MD on 08/22/2019     CANCER STAGING:  Cancer Staging  Multiple myeloma ([place]) Staging form: Plasma Cell Myeloma and Plasma Cell Disorders, AJCC 8th Edition - Clinical stage from 08/22/2019: RISS Stage II (Beta-2-microglobulin (mg/L): 3.8, Albumin (g/dL): 3.2, ISS: Stage II, High-risk cytogenetics: Absent, LDH: Normal) - Signed by Derek Jack, MD on 08/22/2019   INTERVAL HISTORY:  Stephanie Galvan, a 67 y.o. female, seen for follow-up of for multiple myeloma.  She is tolerating Revlimid very well.  Reports her tingling in the feet is slightly better than last visit.  Energy levels are 80%.  Denies any fevers, infections in the last 3 months.  No new onset pains reported.   REVIEW OF SYSTEMS:  Review of Systems  Constitutional:  Negative for appetite change and fatigue.  Respiratory:  Negative for cough.   Gastrointestinal:  Positive for diarrhea.  Musculoskeletal:  Positive for back pain (4/10).  Neurological:  Positive for numbness (feet).  All other systems reviewed and are negative.   PAST MEDICAL/SURGICAL HISTORY:  Past Medical  History:  Diagnosis Date   Breast cancer ([facility]) 2006   left breast   Breast mass, right    Compression fracture of cervical spine ([facility]) 06/18/2019   recent Chest xray stating had fx.   High cholesterol    History of bladder infections    Hypertension    Rash Aug 2013   Past Surgical History:  Procedure Laterality Date   ABDOMINAL HYSTERECTOMY     just uterus removed   BREAST BIOPSY     BREAST LUMPECTOMY WITH RADIOACTIVE SEED LOCALIZATION Right 07/24/2018   Procedure: RIGHT BREAST LUMPECTOMY WITH RADIOACTIVE SEED LOCALIZATION;  Surgeon: Coralie Keens, MD;  Location: [facility];  Service: General;  Laterality: Right;   IR RADIOLOGIST EVAL & MGMT  07/28/2020   MASTECTOMY  5/06   left   PARTIAL HYSTERECTOMY     PORT-A-CATH REMOVAL     PORTACATH PLACEMENT      SOCIAL HISTORY:  Social History   Socioeconomic History   Marital status: Married    Spouse name: Not on file   Number of children: 2   Years of education: Not on file   Highest education level: Not on file  Occupational History    Employer: [employer]  Tobacco Use   Smoking status: Never   Smokeless tobacco: Never  Vaping Use   Vaping Use: Never used  Substance and Sexual Activity   Alcohol use: No   Drug use: No   Sexual activity: Yes    Birth control/protection: Surgical  Other Topics Concern   Not on file  Social History Narrative   Not on file   Social  Determinants of Health   Financial Resource Strain: Low Risk  (07/20/2020)   Overall Financial Resource Strain (CARDIA)    Difficulty of Paying Living Expenses: Not hard at all  Food Insecurity: No Food Insecurity (07/20/2020)   Hunger Vital Sign    Worried About Running Out of Food in the Last Year: Never true    Ran Out of Food in the Last Year: Never true  Transportation Needs: No Transportation Needs (07/20/2020)   PRAPARE - Hydrologist (Medical): No    Lack of Transportation (Non-Medical): No   Physical Activity: Inactive (07/20/2020)   Exercise Vital Sign    Days of Exercise per Week: 0 days    Minutes of Exercise per Session: 0 min  Stress: No Stress Concern Present (07/20/2020)   [place]    Feeling of Stress : Not at all  Social Connections: Moderately Integrated (07/20/2020)   Social Connection and Isolation Panel [NHANES]    Frequency of Communication with Friends and Family: More than three times a week    Frequency of Social Gatherings with Friends and Family: Never    Attends Religious Services: More than 4 times per year    Active Member of [employer] or Organizations: No    Attends Archivist Meetings: Never    Marital Status: Married  Human resources officer Violence: Not At Risk (07/20/2020)   Humiliation, Afraid, Rape, and Kick questionnaire    Fear of Current or Ex-Partner: No    Emotionally Abused: No    Physically Abused: No    Sexually Abused: No    FAMILY HISTORY:  Family History  Problem Relation Age of Onset   Dementia Mother    Stomach cancer Father    Cancer Brother    Cancer Brother    Diabetes Brother    Colitis Daughter    Hypertension Daughter    Hypertension Daughter     CURRENT MEDICATIONS:  Current Outpatient Medications  Medication Sig Dispense Refill   aspirin EC 81 MG tablet Take 81 mg by mouth daily. Swallow whole.     calcium carbonate (TUMS - DOSED IN MG ELEMENTAL CALCIUM) 500 MG chewable tablet Chew 2 tablets by mouth 2 (two) times daily.     gabapentin (NEURONTIN) 300 MG capsule Take 1 capsule (300 mg total) by mouth daily. 30 capsule 3   hydrochlorothiazide (HYDRODIURIL) 25 MG tablet Take 25 mg by mouth daily.     HYDROcodone-acetaminophen (NORCO) 10-325 MG tablet Take 1 tablet by mouth 2 (two) times daily as needed for moderate pain. 45 tablet 0   lenalidomide (REVLIMID) 10 MG capsule TAKE 1 CAPSULE BY MOUTH EVERY DAY FOR 3 WEEKS ON THEN 1 WEEK OFF FOR A 28  DAY SUPPLY 21 capsule 0   magnesium oxide (MAG-OX) 400 (241.3 Mg) MG tablet Take 1 tablet (400 mg total) by mouth daily. 60 tablet 3   magnesium oxide (MAG-OX) 400 MG tablet Take 1 tablet by mouth daily.     potassium chloride (KLOR-CON) 10 MEQ tablet Take 2 tablets (20 mEq total) by mouth 2 (two) times daily. 180 tablet 3   Vitamin D, Ergocalciferol, (DRISDOL) 1.25 MG (50000 UNIT) CAPS capsule TAKE 1 CAPSULE BY MOUTH ONE TIME PER WEEK 12 capsule 1   No current facility-administered medications for this visit.    ALLERGIES:  No Known Allergies  PHYSICAL EXAM:  Performance status (ECOG): 1 - Symptomatic but completely ambulatory  Vitals:  09/19/22 1309  BP: (!) 147/99  Pulse: 85  Resp: 16  Temp: 98.2 F (36.8 C)  SpO2: 100%   Wt Readings from Last 3 Encounters:  09/19/22 155 lb 9.6 oz (70.6 kg)  06/27/22 157 lb (71.2 kg)  04/04/22 156 lb 3.2 oz (70.9 kg)   Physical Exam Vitals reviewed.  Constitutional:      Appearance: Normal appearance.  Cardiovascular:     Rate and Rhythm: Normal rate and regular rhythm.     Pulses: Normal pulses.     Heart sounds: Normal heart sounds.  Pulmonary:     Effort: Pulmonary effort is normal.     Breath sounds: Normal breath sounds.  Neurological:     General: No focal deficit present.     Mental Status: She is alert and oriented to person, place, and time.  Psychiatric:        Mood and Affect: Mood normal.        Behavior: Behavior normal.     LABORATORY DATA:  I have reviewed the labs as listed.     Latest Ref Rng & Units 09/12/2022    2:23 PM 06/20/2022   10:53 AM 03/28/2022    2:32 PM  CBC  WBC 4.0 - 10.5 K/uL 3.2  3.1  2.7   Hemoglobin 12.0 - 15.0 g/dL 11.7  12.0  11.7   Hematocrit 36.0 - 46.0 % 36.1  36.7  36.1   Platelets 150 - 400 K/uL 184  187  197       Latest Ref Rng & Units 09/12/2022    2:23 PM 06/20/2022   10:53 AM 03/28/2022    2:32 PM  CMP  Glucose 70 - 99 mg/dL 79  86  92   BUN 8 - 23 mg/dL '18  15  17    '$ Creatinine 0.44 - 1.00 mg/dL 0.85  0.83  0.89   Sodium 135 - 145 mmol/L 136  139  138   Potassium 3.5 - 5.1 mmol/L 3.1  3.5  3.4   Chloride 98 - 111 mmol/L 100  104  102   CO2 22 - 32 mmol/L '30  28  28   '$ Calcium 8.9 - 10.3 mg/dL 8.6  8.8  9.3   Total Protein 6.5 - 8.1 g/dL 7.0  7.4  7.4   Total Bilirubin 0.3 - 1.2 mg/dL 0.9  0.8  0.8   Alkaline Phos 38 - 126 U/L 85  94  103   AST 15 - 41 U/L 38  44  27   ALT 0 - 44 U/L 42  43  33     DIAGNOSTIC IMAGING:  I have independently reviewed the scans and discussed with the patient. No results found.   ASSESSMENT:  1. 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  Revlimid started on 10/06/2019 due to delay. -Velcade held since 12/15/2019 due to gastric symptoms, which have resolved completely. -She was evaluated by Dr. Samule Ohm at [facility].  Recommended to have immediate bone marrow transplant. -She has compression fractures of T8, T10 and T12.  Also L1 and L3 compression fractures. -Revlimid and dexamethasone continued until 03/22/2020. 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 -Revlimid 10 mg daily started around first week of December 2021. - Revlimid dose decreased to 10 mg 3 weeks on 1 week off on 01/04/2021 due to worsening back pain.   2.  Upper/mid back pain: -MRI on  07/13/2020 shows T7, T8, T12, L1 and L2 compression/superior endplate fractures.  Height loss is greatest and moderate at T7 and T8.  No evidence of bone lesions. - She was previously evaluated by IR for vertebroplasty which was not recommended.   PLAN:  1.  IgA lambda plasma cell myeloma: - Reviewed myeloma labs from 09/12/2022: M spike undetectable.  Immunofixation unremarkable.  FLC ratio 1.53.  Kappa light chains 45.8 and stable. - Continue Revlimid 10 mg 3 weeks on/1 week off. - RTC 3 months with repeat myeloma labs.   2.  Hypokalemia: - Continue potassium 20 mEq  twice daily.  Potassium today is 3.1.   3.  Bone protection: - Calcium is 8.6.  Continue Zometa today and every 12 weeks until April 2024.   4.  Thromboprophylaxis: - Continue aspirin 81 mg daily.   5.  Neuropathy: - Neuropathy in the feet is stable.   6.  Hypomagnesemia: - Continue magnesium every other day.  Magnesium is 1.9.   Orders placed this encounter:  No orders of the defined types were placed in this encounter.    Derek Jack, MD [place] [phone]

## 2022-09-19 NOTE — Progress Notes (Signed)
Patient is taking Revlimid as prescribed.  She has not missed any doses and reports no side effects at this time.   

## 2022-09-19 NOTE — Progress Notes (Signed)
Patient presents today for Zometa infusion per providers order.  Vital signs and labs reviewed by the MD.  Message received from Anastasio Champion RN/Dr. Delton Coombes patient okay for treatment.  Peripheral IV started and blood return noted pre and post infusion.  Stable during infusion without adverse affects.  Vital signs stable.  No complaints at this time.  Discharge from clinic ambulatory in stable condition.  Alert and oriented X 3.  Follow up with [facility] as scheduled.

## 2022-09-19 NOTE — Patient Instructions (Signed)
[facility]  Discharge Instructions: Thank you for choosing [facility] to provide your oncology and hematology care.  If you have a lab appointment with the [facility], please come in thru the Main Entrance and check in at the main information desk.  Wear comfortable clothing and clothing appropriate for easy access to any Portacath or PICC line.   We strive to give you quality time with your provider. You may need to reschedule your appointment if you arrive late (15 or more minutes).  Arriving late affects you and other patients whose appointments are after yours.  Also, if you miss three or more appointments without notifying the office, you may be dismissed from the clinic at the provider's discretion.      For prescription refill requests, have your pharmacy contact our office and allow 72 hours for refills to be completed.    Today you received the following chemotherapy and/or immunotherapy agents Zometa      To help prevent nausea and vomiting after your treatment, we encourage you to take your nausea medication as directed.  BELOW ARE SYMPTOMS THAT SHOULD BE REPORTED IMMEDIATELY: *FEVER GREATER THAN 100.4 F (38 C) OR HIGHER *CHILLS OR SWEATING *NAUSEA AND VOMITING THAT IS NOT CONTROLLED WITH YOUR NAUSEA MEDICATION *UNUSUAL SHORTNESS OF BREATH *UNUSUAL BRUISING OR BLEEDING *URINARY PROBLEMS (pain or burning when urinating, or frequent urination) *BOWEL PROBLEMS (unusual diarrhea, constipation, pain near the anus) TENDERNESS IN MOUTH AND THROAT WITH OR WITHOUT PRESENCE OF ULCERS (sore throat, sores in mouth, or a toothache) UNUSUAL RASH, SWELLING OR PAIN  UNUSUAL VAGINAL DISCHARGE OR ITCHING   Items with * indicate a potential emergency and should be followed up as soon as possible or go to the Emergency Department if any problems should occur.  Please show the CHEMOTHERAPY ALERT CARD or IMMUNOTHERAPY ALERT CARD at check-in to the Emergency  Department and triage nurse.  Should you have questions after your visit or need to cancel or reschedule your appointment, please contact [facility] [phone]  and follow the prompts.  Office hours are 8:00 a.m. to 4:30 p.m. Monday - Friday. Please note that voicemails left after 4:00 p.m. may not be returned until the following business day.  We are closed weekends and major holidays. You have access to a nurse at all times for urgent questions. Please call the main number to the clinic [phone] and follow the prompts.  For any non-urgent questions, you may also contact your provider using MyChart. We now offer e-Visits for anyone 77 and older to request care online for non-urgent symptoms. For details visit [email or web address].   Also download the MyChart app! Go to the app store, search "MyChart", open the app, select [facility], and log in with your MyChart username and password.

## 2022-09-19 NOTE — Telephone Encounter (Signed)
Speculator to check on the status of the patient's medication. Med is scheduled for delivery on 09/20/22. Copay $0 utilizing the copay grant/

## 2022-10-09 ENCOUNTER — Other Ambulatory Visit: Payer: Self-pay | Admitting: Hematology

## 2022-10-09 DIAGNOSIS — C9001 Multiple myeloma in remission: Secondary | ICD-10-CM

## 2022-10-11 ENCOUNTER — Other Ambulatory Visit: Payer: Self-pay

## 2022-10-11 DIAGNOSIS — C9001 Multiple myeloma in remission: Secondary | ICD-10-CM

## 2022-10-11 MED ORDER — LENALIDOMIDE 10 MG PO CAPS: ORAL_CAPSULE | ORAL | 0 refills | Status: DC

## 2022-10-11 NOTE — Telephone Encounter (Signed)
Chart reviewed. Revlimid refilled per last office note with Dr. Katragadda.  

## 2022-11-06 ENCOUNTER — Other Ambulatory Visit: Payer: Self-pay | Admitting: Hematology

## 2022-11-06 DIAGNOSIS — C9001 Multiple myeloma in remission: Secondary | ICD-10-CM

## 2022-11-08 ENCOUNTER — Other Ambulatory Visit: Payer: Self-pay

## 2022-11-08 DIAGNOSIS — C9001 Multiple myeloma in remission: Secondary | ICD-10-CM

## 2022-11-08 MED ORDER — LENALIDOMIDE 10 MG PO CAPS: ORAL_CAPSULE | ORAL | 0 refills | Status: DC

## 2022-11-08 NOTE — Telephone Encounter (Signed)
Chart reviewed. Revlimid refilled per last office note with Dr. Katragadda.  

## 2022-12-03 ENCOUNTER — Other Ambulatory Visit: Payer: Self-pay | Admitting: Hematology

## 2022-12-03 DIAGNOSIS — C9001 Multiple myeloma in remission: Secondary | ICD-10-CM

## 2022-12-04 ENCOUNTER — Other Ambulatory Visit: Payer: Self-pay

## 2022-12-04 DIAGNOSIS — C9001 Multiple myeloma in remission: Secondary | ICD-10-CM

## 2022-12-04 MED ORDER — LENALIDOMIDE 10 MG PO CAPS: ORAL_CAPSULE | ORAL | 0 refills | Status: DC

## 2022-12-04 NOTE — Telephone Encounter (Signed)
Chart reviewed. Revlimid refilled per last office note with Dr. Katragadda.  

## 2022-12-05 ENCOUNTER — Inpatient Hospital Stay: Payer: Medicare HMO | Attending: Hematology

## 2022-12-05 DIAGNOSIS — Z79899 Other long term (current) drug therapy: Secondary | ICD-10-CM | POA: Insufficient documentation

## 2022-12-05 DIAGNOSIS — R200 Anesthesia of skin: Secondary | ICD-10-CM | POA: Insufficient documentation

## 2022-12-05 DIAGNOSIS — R197 Diarrhea, unspecified: Secondary | ICD-10-CM | POA: Insufficient documentation

## 2022-12-05 DIAGNOSIS — G629 Polyneuropathy, unspecified: Secondary | ICD-10-CM | POA: Insufficient documentation

## 2022-12-05 DIAGNOSIS — M549 Dorsalgia, unspecified: Secondary | ICD-10-CM | POA: Insufficient documentation

## 2022-12-05 DIAGNOSIS — C9000 Multiple myeloma not having achieved remission: Secondary | ICD-10-CM | POA: Insufficient documentation

## 2022-12-05 LAB — CBC WITH DIFFERENTIAL/PLATELET
Abs Immature Granulocytes: 0.01 10*3/uL (ref 0.00–0.07)
Basophils Absolute: 0 10*3/uL (ref 0.0–0.1)
Basophils Relative: 1 %
Eosinophils Absolute: 0.1 10*3/uL (ref 0.0–0.5)
Eosinophils Relative: 3 %
HCT: 36.3 % (ref 36.0–46.0)
Hemoglobin: 11.9 g/dL — ABNORMAL LOW (ref 12.0–15.0)
Immature Granulocytes: 0 %
Lymphocytes Relative: 26 %
Lymphs Abs: 0.9 10*3/uL (ref 0.7–4.0)
MCH: 31.4 pg (ref 26.0–34.0)
MCHC: 32.8 g/dL (ref 30.0–36.0)
MCV: 95.8 fL (ref 80.0–100.0)
Monocytes Absolute: 0.2 10*3/uL (ref 0.1–1.0)
Monocytes Relative: 6 %
Neutro Abs: 2.1 10*3/uL (ref 1.7–7.7)
Neutrophils Relative %: 64 %
Platelets: 190 10*3/uL (ref 150–400)
RBC: 3.79 MIL/uL — ABNORMAL LOW (ref 3.87–5.11)
RDW: 14.6 % (ref 11.5–15.5)
WBC: 3.2 10*3/uL — ABNORMAL LOW (ref 4.0–10.5)
nRBC: 0 % (ref 0.0–0.2)

## 2022-12-05 LAB — COMPREHENSIVE METABOLIC PANEL
ALT: 24 U/L (ref 0–44)
AST: 23 U/L (ref 15–41)
Albumin: 3.6 g/dL (ref 3.5–5.0)
Alkaline Phosphatase: 83 U/L (ref 38–126)
Anion gap: 8 (ref 5–15)
BUN: 14 mg/dL (ref 8–23)
CO2: 26 mmol/L (ref 22–32)
Calcium: 11.1 mg/dL — ABNORMAL HIGH (ref 8.9–10.3)
Chloride: 99 mmol/L (ref 98–111)
Creatinine, Ser: 0.83 mg/dL (ref 0.44–1.00)
GFR, Estimated: >60 mL/min (ref >60)
Glucose, Bld: 67 mg/dL — ABNORMAL LOW (ref 70–99)
Potassium: 3.3 mmol/L — ABNORMAL LOW (ref 3.5–5.1)
Sodium: 133 mmol/L — ABNORMAL LOW (ref 135–145)
Total Bilirubin: 0.6 mg/dL (ref 0.3–1.2)
Total Protein: 7.1 g/dL (ref 6.5–8.1)

## 2022-12-05 LAB — LACTATE DEHYDROGENASE: LDH: 102 U/L (ref 98–192)

## 2022-12-05 LAB — MAGNESIUM: Magnesium: 1.8 mg/dL (ref 1.7–2.4)

## 2022-12-06 LAB — KAPPA/LAMBDA LIGHT CHAINS
Kappa free light chain: 45 mg/L — ABNORMAL HIGH (ref 3.3–19.4)
Kappa, lambda light chain ratio: 1.46 (ref 0.26–1.65)
Lambda free light chains: 30.8 mg/L — ABNORMAL HIGH (ref 5.7–26.3)

## 2022-12-10 LAB — PROTEIN ELECTROPHORESIS, SERUM
A/G Ratio: 1.2 (ref 0.7–1.7)
Albumin ELP: 3.6 g/dL (ref 2.9–4.4)
Alpha-1-Globulin: 0.2 g/dL (ref 0.0–0.4)
Alpha-2-Globulin: 0.7 g/dL (ref 0.4–1.0)
Beta Globulin: 1 g/dL (ref 0.7–1.3)
Gamma Globulin: 1.2 g/dL (ref 0.4–1.8)
Globulin, Total: 3.1 g/dL (ref 2.2–3.9)
Total Protein ELP: 6.7 g/dL (ref 6.0–8.5)

## 2022-12-10 LAB — IMMUNOFIXATION ELECTROPHORESIS
IgA: 237 mg/dL (ref 87–352)
IgG (Immunoglobin G), Serum: 1212 mg/dL (ref 586–1602)
IgM (Immunoglobulin M), Srm: 33 mg/dL (ref 26–217)
Total Protein ELP: 6.5 g/dL (ref 6.0–8.5)

## 2022-12-11 NOTE — Progress Notes (Signed)
[facility] [address] [place] [address]    Clinic Day:  12/12/2022  Referring physician: [name], MD  Patient Care Team: [name], MD as PCP - General (Family Medicine) Derek Jack, MD as Consulting Physician (Hematology) Jeanann Lewandowsky, MD as Referring Physician (Internal Medicine)   ASSESSMENT & PLAN:   Assessment: 1. 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  Revlimid started on 10/06/2019 due to delay. -Velcade held since 12/15/2019 due to gastric symptoms, which have resolved completely. -She was evaluated by Dr. Samule Ohm at [facility].  Recommended to have immediate bone marrow transplant. -She has compression fractures of T8, T10 and T12.  Also L1 and L3 compression fractures. -Revlimid and dexamethasone continued until 03/22/2020. 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 -Revlimid 10 mg daily started around first week of December 2021. - Revlimid dose decreased to 10 mg 3 weeks on 1 week off on 01/04/2021 due to worsening back pain.   2.  Upper/mid back pain: -MRI on 07/13/2020 shows T7, T8, T12, L1 and L2 compression/superior endplate fractures.  Height loss is greatest and moderate at T7 and T8.  No evidence of bone lesions. - She was previously evaluated by IR for vertebroplasty which was not recommended.   Plan: 1.  IgA lambda plasma cell myeloma: - Reviewed myeloma labs from 12/05/2022.  M spike is negative.  Immunofixation shows unremarkable.  Free light chain ratio is 1.46.  Lambda light chains at 30.8. - CMP shows elevated calcium of 11.1.  Rest of the labs were at baseline. - She reports taking 2 tablets of Tums daily amounting to 1000 mg.  She also takes vitamin D 50,000 units weekly for the past couple of years. - I have recommended checking 25-hydroxy vitamin D, one 25-hydroxy vitamin D and intact PTH. - RTC 3  months for follow-up with repeat myeloma labs.   2.  Hypokalemia: - Continue potassium 20 mEq twice daily.  Potassium today is 3.3.   3.  Bone protection: - Calcium is 11.1 today.  Continue Zometa today, potentially last dose.  Will restart Zometa if any recurrence of myeloma.   4.  Thromboprophylaxis: - Continue aspirin 81 mg daily.   5.  Neuropathy: - Neuropathy in the feet is stable.   6.  Hypomagnesemia: - Magnesium is 1.8 today.  Continue magnesium every other day.  If there is any worsening of diarrhea, she may discontinue magnesium.  Orders Placed This Encounter  Procedures   Vitamin D 25 hydroxy   Calcitriol (1,25 di-OH Vit D)   PTH, intact and calcium      I,Katie Daubenspeck,acting as a scribe for Derek Jack, MD.,have documented all relevant documentation on the behalf of Derek Jack, MD,as directed by  Derek Jack, MD while in the presence of Derek Jack, MD.   I, Derek Jack MD, have reviewed the above documentation for accuracy and completeness, and I agree with the above.   Derek Jack, MD   4/3/20244:49 PM  CHIEF COMPLAINT:   Diagnosis: multiple myeloma    Cancer Staging  Multiple myeloma Staging form: Plasma Cell Myeloma and Plasma Cell Disorders, AJCC 8th Edition - Clinical stage from 08/22/2019: RISS Stage II (Beta-2-microglobulin (mg/L): 3.8, Albumin (g/dL): 3.2, ISS: Stage II, High-risk cytogenetics: Absent, LDH: Normal) - Signed by Derek Jack, MD on 08/22/2019    Prior Therapy: 1. RVD x 6 cycles from 08/19/2019 to 12/15/2019.  2. 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  Current Therapy:  Revlimid 10 mg daily; Zometa every 12 weeks    HISTORY OF PRESENT ILLNESS:   Oncology History  Multiple myeloma  08/22/2019 Initial Diagnosis   Multiple myeloma ([facility])   08/22/2019 Cancer Staging   Staging form: Plasma Cell Myeloma and Plasma Cell Disorders, AJCC 8th Edition - Clinical stage from 08/22/2019:  RISS Stage II (Beta-2-microglobulin (mg/L): 3.8, Albumin (g/dL): 3.2, ISS: Stage II, High-risk cytogenetics: Absent, LDH: Normal) - Signed by Derek Jack, MD on 08/22/2019      INTERVAL HISTORY:   Angeliah is a 67 y.o. female presenting to clinic today for follow up of multiple myeloma. She was last seen by me on 09/19/22.  Today, she states that she is doing well overall. Her appetite level is at 100%. Her energy level is at 60%.  PAST MEDICAL HISTORY:   Past Medical History: Past Medical History:  Diagnosis Date   Breast cancer ([facility]) 2006   left breast   Breast mass, right    Compression fracture of cervical spine ([facility]) 06/18/2019   recent Chest xray stating had fx.   High cholesterol    History of bladder infections    Hypertension    Rash Aug 2013    Surgical History: Past Surgical History:  Procedure Laterality Date   ABDOMINAL HYSTERECTOMY     just uterus removed   BREAST BIOPSY     BREAST LUMPECTOMY WITH RADIOACTIVE SEED LOCALIZATION Right 07/24/2018   Procedure: RIGHT BREAST LUMPECTOMY WITH RADIOACTIVE SEED LOCALIZATION;  Surgeon: Coralie Keens, MD;  Location: [facility];  Service: General;  Laterality: Right;   IR RADIOLOGIST EVAL & MGMT  07/28/2020   MASTECTOMY  5/06   left   PARTIAL HYSTERECTOMY     PORT-A-CATH REMOVAL     PORTACATH PLACEMENT      Social History: Social History   Socioeconomic History   Marital status: Married    Spouse name: Not on file   Number of children: 2   Years of education: Not on file   Highest education level: Not on file  Occupational History    Employer: [employer]  Tobacco Use   Smoking status: Never   Smokeless tobacco: Never  Vaping Use   Vaping Use: Never used  Substance and Sexual Activity   Alcohol use: No   Drug use: No   Sexual activity: Yes    Birth control/protection: Surgical  Other Topics Concern   Not on file  Social History Narrative   Not on file   Social  Determinants of Health   Financial Resource Strain: Low Risk  (07/20/2020)   Overall Financial Resource Strain (CARDIA)    Difficulty of Paying Living Expenses: Not hard at all  Food Insecurity: No Food Insecurity (07/20/2020)   Hunger Vital Sign    Worried About Running Out of Food in the Last Year: Never true    [place] in the Last Year: Never true  Transportation Needs: No Transportation Needs (07/20/2020)   PRAPARE - Hydrologist (Medical): No    Lack of Transportation (Non-Medical): No  Physical Activity: Inactive (07/20/2020)   Exercise Vital Sign    Days of Exercise per Week: 0 days    Minutes of Exercise per Session: 0 min  Stress: No Stress Concern Present (07/20/2020)   [place]    Feeling of Stress : Not at all  Social Connections: Moderately  Integrated (07/20/2020)   Social Connection and Isolation Panel [NHANES]    Frequency of Communication with Friends and Family: More than three times a week    Frequency of Social Gatherings with Friends and Family: Never    Attends Religious Services: More than 4 times per year    Active Member of [employer] or Organizations: No    Attends Archivist Meetings: Never    Marital Status: Married  Human resources officer Violence: Not At Risk (07/20/2020)   Humiliation, Afraid, Rape, and Kick questionnaire    Fear of Current or Ex-Partner: No    Emotionally Abused: No    Physically Abused: No    Sexually Abused: No    Family History: Family History  Problem Relation Age of Onset   Dementia Mother    Stomach cancer Father    Cancer Brother    Cancer Brother    Diabetes Brother    Colitis Daughter    Hypertension Daughter    Hypertension Daughter     Current Medications:  Current Outpatient Medications:    aspirin EC 81 MG tablet, Take 81 mg by mouth daily. Swallow whole., Disp: , Rfl:    atenolol (TENORMIN) 25 MG  tablet, Take 25 mg by mouth daily., Disp: , Rfl:    calcium carbonate (TUMS - DOSED IN MG ELEMENTAL CALCIUM) 500 MG chewable tablet, Chew 2 tablets by mouth 2 (two) times daily., Disp: , Rfl:    hydrochlorothiazide (HYDRODIURIL) 25 MG tablet, Take 25 mg by mouth daily., Disp: , Rfl:    lenalidomide (REVLIMID) 10 MG capsule, TAKE 1 CAPSULE BY MOUTH EVERY DAY FOR 3 WEEKS ON THEN 1 WEEK OFF FOR A 28 DAY SUPPLY, Disp: 21 capsule, Rfl: 0   magnesium oxide (MAG-OX) 400 (241.3 Mg) MG tablet, Take 1 tablet (400 mg total) by mouth daily., Disp: 60 tablet, Rfl: 3   potassium chloride (KLOR-CON) 10 MEQ tablet, Take 2 tablets (20 mEq total) by mouth 2 (two) times daily., Disp: 180 tablet, Rfl: 3   Vitamin D, Ergocalciferol, (DRISDOL) 1.25 MG (50000 UNIT) CAPS capsule, TAKE 1 CAPSULE BY MOUTH ONE TIME PER WEEK, Disp: 12 capsule, Rfl: 1   Allergies: No Known Allergies  REVIEW OF SYSTEMS:   Review of Systems  Constitutional:  Negative for chills, fatigue and fever.  HENT:   Negative for lump/mass, mouth sores, nosebleeds, sore throat and trouble swallowing.   Eyes:  Negative for eye problems.  Respiratory:  Negative for cough and shortness of breath.   Cardiovascular:  Negative for chest pain, leg swelling and palpitations.  Gastrointestinal:  Positive for abdominal pain and diarrhea. Negative for constipation, nausea and vomiting.  Genitourinary:  Negative for bladder incontinence, difficulty urinating, dysuria, frequency, hematuria and nocturia.   Musculoskeletal:  Negative for arthralgias, back pain, flank pain, myalgias and neck pain.  Skin:  Negative for itching and rash.  Neurological:  Positive for numbness. Negative for dizziness and headaches.  Hematological:  Does not bruise/bleed easily.  Psychiatric/Behavioral:  Negative for depression, sleep disturbance and suicidal ideas. The patient is not nervous/anxious.   All other systems reviewed and are negative.    VITALS:   Blood pressure  138/77, pulse 66, temperature 98.3 F (36.8 C), temperature source Oral, resp. rate 16, weight 161 lb 12.8 oz (73.4 kg), SpO2 100 %.  Wt Readings from Last 3 Encounters:  12/12/22 161 lb 12.8 oz (73.4 kg)  09/19/22 155 lb 9.6 oz (70.6 kg)  06/27/22 157 lb (71.2 kg)  Body mass index is 26.12 kg/m.  Performance status (ECOG): 1 - Symptomatic but completely ambulatory  PHYSICAL EXAM:   Physical Exam Vitals and nursing note reviewed. Exam conducted with a chaperone present.  Constitutional:      Appearance: Normal appearance.  Cardiovascular:     Rate and Rhythm: Normal rate and regular rhythm.     Pulses: Normal pulses.     Heart sounds: Normal heart sounds.  Pulmonary:     Effort: Pulmonary effort is normal.     Breath sounds: Normal breath sounds.  Abdominal:     Palpations: Abdomen is soft. There is no hepatomegaly, splenomegaly or mass.     Tenderness: There is no abdominal tenderness.  Musculoskeletal:     Right lower leg: No edema.     Left lower leg: No edema.  Lymphadenopathy:     Cervical: No cervical adenopathy.     Right cervical: No superficial, deep or posterior cervical adenopathy.    Left cervical: No superficial, deep or posterior cervical adenopathy.     Upper Body:     Right upper body: No supraclavicular or axillary adenopathy.     Left upper body: No supraclavicular or axillary adenopathy.  Neurological:     General: No focal deficit present.     Mental Status: She is alert and oriented to person, place, and time.  Psychiatric:        Mood and Affect: Mood normal.        Behavior: Behavior normal.     LABS:      Latest Ref Rng & Units 12/05/2022    1:22 PM 09/12/2022    2:23 PM 06/20/2022   10:53 AM  CBC  WBC 4.0 - 10.5 K/uL 3.2  3.2  3.1   Hemoglobin 12.0 - 15.0 g/dL 11.9  11.7  12.0   Hematocrit 36.0 - 46.0 % 36.3  36.1  36.7   Platelets 150 - 400 K/uL 190  184  187       Latest Ref Rng & Units 12/05/2022    1:22 PM 09/12/2022    2:23 PM  06/20/2022   10:53 AM  CMP  Glucose 70 - 99 mg/dL 67  79  86   BUN 8 - 23 mg/dL 14  18  15    Creatinine 0.44 - 1.00 mg/dL 0.83  0.85  0.83   Sodium 135 - 145 mmol/L 133  136  139   Potassium 3.5 - 5.1 mmol/L 3.3  3.1  3.5   Chloride 98 - 111 mmol/L 99  100  104   CO2 22 - 32 mmol/L 26  30  28    Calcium 8.9 - 10.3 mg/dL 11.1  8.6  8.8   Total Protein 6.5 - 8.1 g/dL 7.1  7.0  7.4   Total Bilirubin 0.3 - 1.2 mg/dL 0.6  0.9  0.8   Alkaline Phos 38 - 126 U/L 83  85  94   AST 15 - 41 U/L 23  38  44   ALT 0 - 44 U/L 24  42  43      No results found for: "CEA1", "CEA" / No results found for: "CEA1", "CEA" No results found for: "PSA1" No results found for: "[id number]" No results found for: "CAN125"  Lab Results  Component Value Date   TOTALPROTELP 6.7 12/05/2022   TOTALPROTELP 6.5 12/05/2022   ALBUMINELP 3.6 12/05/2022   A1GS 0.2 12/05/2022   A2GS 0.7 12/05/2022   BETS 1.0 12/05/2022   GAMS  1.2 12/05/2022   MSPIKE Not Observed 12/05/2022   SPEI Comment 12/05/2022   No results found for: "TIBC", "FERRITIN", "IRONPCTSAT" Lab Results  Component Value Date   LDH 102 12/05/2022   LDH 118 06/20/2022   LDH 107 03/28/2022     STUDIES:   No results found.

## 2022-12-12 ENCOUNTER — Inpatient Hospital Stay: Payer: Medicare HMO

## 2022-12-12 ENCOUNTER — Inpatient Hospital Stay: Payer: Medicare HMO | Attending: Hematology | Admitting: Hematology

## 2022-12-12 VITALS — BP 131/73 | HR 56 | Temp 97.2°F | Resp 18

## 2022-12-12 DIAGNOSIS — Z7961 Long term (current) use of immunomodulator: Secondary | ICD-10-CM | POA: Insufficient documentation

## 2022-12-12 DIAGNOSIS — Z90710 Acquired absence of both cervix and uterus: Secondary | ICD-10-CM | POA: Insufficient documentation

## 2022-12-12 DIAGNOSIS — Z853 Personal history of malignant neoplasm of breast: Secondary | ICD-10-CM | POA: Insufficient documentation

## 2022-12-12 DIAGNOSIS — I10 Essential (primary) hypertension: Secondary | ICD-10-CM | POA: Diagnosis not present

## 2022-12-12 DIAGNOSIS — Z8249 Family history of ischemic heart disease and other diseases of the circulatory system: Secondary | ICD-10-CM | POA: Insufficient documentation

## 2022-12-12 DIAGNOSIS — Z9484 Stem cells transplant status: Secondary | ICD-10-CM | POA: Insufficient documentation

## 2022-12-12 DIAGNOSIS — G629 Polyneuropathy, unspecified: Secondary | ICD-10-CM | POA: Insufficient documentation

## 2022-12-12 DIAGNOSIS — Z809 Family history of malignant neoplasm, unspecified: Secondary | ICD-10-CM | POA: Diagnosis not present

## 2022-12-12 DIAGNOSIS — Z79899 Other long term (current) drug therapy: Secondary | ICD-10-CM | POA: Insufficient documentation

## 2022-12-12 DIAGNOSIS — Z833 Family history of diabetes mellitus: Secondary | ICD-10-CM | POA: Diagnosis not present

## 2022-12-12 DIAGNOSIS — E876 Hypokalemia: Secondary | ICD-10-CM | POA: Insufficient documentation

## 2022-12-12 DIAGNOSIS — Z8379 Family history of other diseases of the digestive system: Secondary | ICD-10-CM | POA: Diagnosis not present

## 2022-12-12 DIAGNOSIS — Z800 Family history of malignant neoplasm of digestive organs: Secondary | ICD-10-CM | POA: Insufficient documentation

## 2022-12-12 DIAGNOSIS — E7800 Pure hypercholesterolemia, unspecified: Secondary | ICD-10-CM | POA: Diagnosis not present

## 2022-12-12 DIAGNOSIS — C9000 Multiple myeloma not having achieved remission: Secondary | ICD-10-CM | POA: Diagnosis present

## 2022-12-12 DIAGNOSIS — Z7982 Long term (current) use of aspirin: Secondary | ICD-10-CM | POA: Diagnosis not present

## 2022-12-12 LAB — VITAMIN D 25 HYDROXY (VIT D DEFICIENCY, FRACTURES): Vit D, 25-Hydroxy: 106.93 ng/mL — ABNORMAL HIGH (ref 30–100)

## 2022-12-12 MED ORDER — ZOLEDRONIC ACID 4 MG/100ML IV SOLN
4.0000 mg | Freq: Once | INTRAVENOUS | Status: AC
  Administered 2022-12-12: 4 mg via INTRAVENOUS
  Filled 2022-12-12: qty 100

## 2022-12-12 MED ORDER — SODIUM CHLORIDE 0.9 % IV SOLN: Freq: Once | INTRAVENOUS | Status: AC

## 2022-12-12 NOTE — Patient Instructions (Signed)
[facility]  Discharge Instructions: Thank you for choosing [facility] to provide your oncology and hematology care.  If you have a lab appointment with the [facility] - please note that after April 8th, 2024, all labs will be drawn in the cancer center.  You do not have to check in or register with the main entrance as you have in the past but will complete your check-in in the cancer center.  Wear comfortable clothing and clothing appropriate for easy access to any Portacath or PICC line.   We strive to give you quality time with your provider. You may need to reschedule your appointment if you arrive late (15 or more minutes).  Arriving late affects you and other patients whose appointments are after yours.  Also, if you miss three or more appointments without notifying the office, you may be dismissed from the clinic at the provider's discretion.      For prescription refill requests, have your pharmacy contact our office and allow 72 hours for refills to be completed.    Today you received the following Zometa, return as scheduled.   To help prevent nausea and vomiting after your treatment, we encourage you to take your nausea medication as directed.  BELOW ARE SYMPTOMS THAT SHOULD BE REPORTED IMMEDIATELY: *FEVER GREATER THAN 100.4 F (38 C) OR HIGHER *CHILLS OR SWEATING *NAUSEA AND VOMITING THAT IS NOT CONTROLLED WITH YOUR NAUSEA MEDICATION *UNUSUAL SHORTNESS OF BREATH *UNUSUAL BRUISING OR BLEEDING *URINARY PROBLEMS (pain or burning when urinating, or frequent urination) *BOWEL PROBLEMS (unusual diarrhea, constipation, pain near the anus) TENDERNESS IN MOUTH AND THROAT WITH OR WITHOUT PRESENCE OF ULCERS (sore throat, sores in mouth, or a toothache) UNUSUAL RASH, SWELLING OR PAIN  UNUSUAL VAGINAL DISCHARGE OR ITCHING   Items with * indicate a potential emergency and should be followed up as soon as possible or go to the Emergency Department if  any problems should occur.  Please show the CHEMOTHERAPY ALERT CARD or IMMUNOTHERAPY ALERT CARD at check-in to the Emergency Department and triage nurse.  Should you have questions after your visit or need to cancel or reschedule your appointment, please contact [facility] [phone]  and follow the prompts.  Office hours are 8:00 a.m. to 4:30 p.m. Monday - Friday. Please note that voicemails left after 4:00 p.m. may not be returned until the following business day.  We are closed weekends and major holidays. You have access to a nurse at all times for urgent questions. Please call the main number to the clinic [phone] and follow the prompts.  For any non-urgent questions, you may also contact your provider using MyChart. We now offer e-Visits for anyone 88 and older to request care online for non-urgent symptoms. For details visit [email or web address].   Also download the MyChart app! Go to the app store, search "MyChart", open the app, select [facility], and log in with your MyChart username and password.

## 2022-12-12 NOTE — Progress Notes (Signed)
Patient is taking Revlimid as prescribed.  She has not missed any doses and reports no side effects at this time.  ? ?

## 2022-12-12 NOTE — Progress Notes (Signed)
Patient okay for Zometa per Dr. Delton Coombes. Patient tolerated therapy with no complaints voiced. Labs reviewed. Side effects with management reviewed with understanding verbalized. Peripheral IV site clean and dry with no bruising or swelling noted at site. Good blood return noted before and after administration of therapy. Band aid applied. Patient left in satisfactory condition with VSS and no s/s of distress noted.

## 2022-12-12 NOTE — Patient Instructions (Signed)
[facility] at [facility] Discharge Instructions   You were seen and examined today by Dr. Delton Coombes.  He reviewed the results of your lab work which are normal/stable.   We will proceed with your final Zometa infusion today.   Return as scheduled.    Thank you for choosing [facility] at [facility] to provide your oncology and hematology care.  To afford each patient quality time with our provider, please arrive at least 15 minutes before your scheduled appointment time.   If you have a lab appointment with the [facility] please come in thru the Main Entrance and check in at the main information desk.  You need to re-schedule your appointment should you arrive 10 or more minutes late.  We strive to give you quality time with our providers, and arriving late affects you and other patients whose appointments are after yours.  Also, if you no show three or more times for appointments you may be dismissed from the clinic at the providers discretion.     Again, thank you for choosing [facility].  Our hope is that these requests will decrease the amount of time that you wait before being seen by our physicians.       _____________________________________________________________  Should you have questions after your visit to [facility], please contact our office at [phone] and follow the prompts.  Our office hours are 8:00 a.m. and 4:30 p.m. Monday - Friday.  Please note that voicemails left after 4:00 p.m. may not be returned until the following business day.  We are closed weekends and major holidays.  You do have access to a nurse 24-7, just call the main number to the clinic [phone] and do not press any options, hold on the line and a nurse will answer the phone.    For prescription refill requests, have your pharmacy contact our office and allow 72 hours.    Due to Covid, you will need to wear a mask  upon entering the hospital. If you do not have a mask, a mask will be given to you at the Main Entrance upon arrival. For doctor visits, patients may have 1 support person age 65 or older with them. For treatment visits, patients can not have anyone with them due to social distancing guidelines and our immunocompromised population.

## 2022-12-13 LAB — PTH, INTACT AND CALCIUM
Calcium, Total (PTH): 9.4 mg/dL (ref 8.7–10.3)
PTH: 22 pg/mL (ref 15–65)

## 2022-12-13 LAB — CALCITRIOL (1,25 DI-OH VIT D): Vit D, 1,25-Dihydroxy: 67.4 pg/mL (ref 24.8–81.5)

## 2023-01-01 ENCOUNTER — Other Ambulatory Visit: Payer: Self-pay | Admitting: Hematology

## 2023-01-01 DIAGNOSIS — C9001 Multiple myeloma in remission: Secondary | ICD-10-CM

## 2023-01-02 ENCOUNTER — Other Ambulatory Visit: Payer: Self-pay

## 2023-01-02 DIAGNOSIS — C9001 Multiple myeloma in remission: Secondary | ICD-10-CM

## 2023-01-02 MED ORDER — LENALIDOMIDE 10 MG PO CAPS
ORAL_CAPSULE | ORAL | 0 refills | Status: DC
Start: 2023-01-02 — End: 2023-01-02

## 2023-01-02 MED ORDER — LENALIDOMIDE 10 MG PO CAPS
ORAL_CAPSULE | ORAL | 0 refills | Status: DC
Start: 2023-01-02 — End: 2023-01-28

## 2023-01-02 NOTE — Telephone Encounter (Signed)
Chart reviewed. Revlimid refilled per verbal order from Dr. Katragadda. 

## 2023-01-04 ENCOUNTER — Other Ambulatory Visit (HOSPITAL_COMMUNITY): Payer: Self-pay | Admitting: Family Medicine

## 2023-01-04 ENCOUNTER — Ambulatory Visit (HOSPITAL_COMMUNITY)
Admission: RE | Admit: 2023-01-04 | Discharge: 2023-01-04 | Disposition: A | Discharge status: Home / Self Care | Payer: Medicare HMO | Source: Ambulatory Visit | Attending: Family Medicine | Admitting: Family Medicine

## 2023-01-04 DIAGNOSIS — R221 Localized swelling, mass and lump, neck: Secondary | ICD-10-CM

## 2023-01-04 DIAGNOSIS — C9000 Multiple myeloma not having achieved remission: Secondary | ICD-10-CM | POA: Insufficient documentation

## 2023-01-04 MED ORDER — IOHEXOL 300 MG/ML  SOLN
75.0000 mL | Freq: Once | INTRAMUSCULAR | Status: AC | PRN
  Administered 2023-01-04: 75 mL via INTRAVENOUS

## 2023-01-08 ENCOUNTER — Telehealth: Payer: Self-pay | Admitting: *Deleted

## 2023-01-08 ENCOUNTER — Ambulatory Visit (HOSPITAL_COMMUNITY)
Admission: RE | Admit: 2023-01-08 | Discharge: 2023-01-08 | Disposition: A | Discharge status: Home / Self Care | Payer: Medicare HMO | Source: Ambulatory Visit | Attending: Hematology | Admitting: Hematology

## 2023-01-08 ENCOUNTER — Other Ambulatory Visit: Payer: Self-pay | Admitting: *Deleted

## 2023-01-08 ENCOUNTER — Encounter (HOSPITAL_COMMUNITY): Payer: Self-pay | Admitting: Hematology

## 2023-01-08 DIAGNOSIS — C50912 Malignant neoplasm of unspecified site of left female breast: Secondary | ICD-10-CM | POA: Diagnosis not present

## 2023-01-08 DIAGNOSIS — R0781 Pleurodynia: Secondary | ICD-10-CM | POA: Diagnosis present

## 2023-01-08 DIAGNOSIS — Z171 Estrogen receptor negative status [ER-]: Secondary | ICD-10-CM | POA: Diagnosis present

## 2023-01-08 NOTE — Telephone Encounter (Signed)
Received telephone call from patient stating she has found, what is thought to be a cyst in her throat by her PCP.  She has been referred to ENT.  Dr. Ellin Saba would like to see her following that appointment.  Patient to make [place] aware once appointment is made.  In addition, she states that she has developed pain in rib area that extends from underneath right breast to the left.  Per Dr. Ellin Saba will obtain X ray today and follow up once resulted.  Patient aware and verbalized understanding.

## 2023-01-09 NOTE — Progress Notes (Signed)
Dr. Ellin Saba reviewed results.  Patient appointment made to see PA next week for follow up.  Patient is aware.

## 2023-01-17 ENCOUNTER — Encounter: Payer: Self-pay | Admitting: Physician Assistant

## 2023-01-17 ENCOUNTER — Inpatient Hospital Stay: Payer: Medicare HMO | Attending: Hematology | Admitting: Physician Assistant

## 2023-01-17 VITALS — BP 151/70 | HR 64 | Temp 97.8°F | Resp 18 | Wt 161.8 lb

## 2023-01-17 DIAGNOSIS — Z833 Family history of diabetes mellitus: Secondary | ICD-10-CM | POA: Insufficient documentation

## 2023-01-17 DIAGNOSIS — Z7961 Long term (current) use of immunomodulator: Secondary | ICD-10-CM | POA: Diagnosis not present

## 2023-01-17 DIAGNOSIS — Z8249 Family history of ischemic heart disease and other diseases of the circulatory system: Secondary | ICD-10-CM | POA: Diagnosis not present

## 2023-01-17 DIAGNOSIS — Z79899 Other long term (current) drug therapy: Secondary | ICD-10-CM | POA: Insufficient documentation

## 2023-01-17 DIAGNOSIS — M549 Dorsalgia, unspecified: Secondary | ICD-10-CM | POA: Insufficient documentation

## 2023-01-17 DIAGNOSIS — Z90710 Acquired absence of both cervix and uterus: Secondary | ICD-10-CM | POA: Diagnosis not present

## 2023-01-17 DIAGNOSIS — Z818 Family history of other mental and behavioral disorders: Secondary | ICD-10-CM | POA: Insufficient documentation

## 2023-01-17 DIAGNOSIS — C9000 Multiple myeloma not having achieved remission: Secondary | ICD-10-CM | POA: Diagnosis present

## 2023-01-17 DIAGNOSIS — R079 Chest pain, unspecified: Secondary | ICD-10-CM | POA: Insufficient documentation

## 2023-01-17 DIAGNOSIS — G629 Polyneuropathy, unspecified: Secondary | ICD-10-CM | POA: Diagnosis not present

## 2023-01-17 DIAGNOSIS — Z809 Family history of malignant neoplasm, unspecified: Secondary | ICD-10-CM | POA: Diagnosis not present

## 2023-01-17 DIAGNOSIS — Z800 Family history of malignant neoplasm of digestive organs: Secondary | ICD-10-CM | POA: Diagnosis not present

## 2023-01-17 DIAGNOSIS — Z8379 Family history of other diseases of the digestive system: Secondary | ICD-10-CM | POA: Insufficient documentation

## 2023-01-17 DIAGNOSIS — G8929 Other chronic pain: Secondary | ICD-10-CM | POA: Insufficient documentation

## 2023-01-17 DIAGNOSIS — R0781 Pleurodynia: Secondary | ICD-10-CM

## 2023-01-17 DIAGNOSIS — C9001 Multiple myeloma in remission: Secondary | ICD-10-CM

## 2023-01-17 NOTE — Progress Notes (Signed)
[facility] [address], [place] [address]    Clinic Day:  01/17/2023  Referring physician: Gareth Morgan, MD  Patient Care Team: Gareth Morgan, MD as PCP - General (Family Medicine) [name], MD as Consulting Physician (Hematology) Eddie Candle, MD as Referring Physician (Internal Medicine)   CHIEF COMPLAINT:   Diagnosis: multiple myeloma    Cancer Staging  Multiple myeloma [facility] Staging form: Plasma Cell Myeloma and Plasma Cell Disorders, AJCC 8th Edition - Clinical stage from 08/22/2019: RISS Stage II (Beta-2-microglobulin (mg/L): 3.8, Albumin (g/dL): 3.2, ISS: Stage II, High-risk cytogenetics: Absent, LDH: Normal) - Signed by [name], MD on 08/22/2019    Prior Therapy: 1. RVD x 6 cycles from 08/19/2019 to 12/15/2019. 2. 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  Current Therapy:  Revlimid 10 mg daily; Zometa every 12 weeks    HISTORY OF PRESENT ILLNESS:   Oncology History  Multiple myeloma (HCC)  08/22/2019 Initial Diagnosis   Multiple myeloma (HCC)   08/22/2019 Cancer Staging   Staging form: Plasma Cell Myeloma and Plasma Cell Disorders, AJCC 8th Edition - Clinical stage from 08/22/2019: RISS Stage II (Beta-2-microglobulin (mg/L): 3.8, Albumin (g/dL): 3.2, ISS: Stage II, High-risk cytogenetics: Absent, LDH: Normal) - Signed by [name], MD on 08/22/2019      INTERVAL HISTORY:   Stephanie Galvan is a 67 y.o. female presenting to clinic today for follow up of multiple myeloma. She was last seen by Dr. Ellin Saba on 12/12/2022. In the interim, she called our clinic complaining of pain below her breasts across her chest.   She underwent chest xray on 01/08/2023 and presents to review the results. She reports since then, her pain has nearly resolved. She continues to have chronic back pain that she rates as 5 out of 10 on a pain scale. She is tolerating her Revlimid without any issues. She reports chronic neuropathy in her  feet. She is awaiting to see ENT for the cyst under her chin.   Today, she states that she is doing well overall. Her appetite level is at 100%. Her energy level is at 75%.  PAST MEDICAL HISTORY:   Past Medical History: Past Medical History:  Diagnosis Date   Breast cancer (HCC) 2006   left breast   Breast mass, right    Compression fracture of cervical spine (HCC) 06/18/2019   recent Chest xray stating had fx.   High cholesterol    History of bladder infections    Hypertension    Rash Aug 2013    Surgical History: Past Surgical History:  Procedure Laterality Date   ABDOMINAL HYSTERECTOMY     just uterus removed   BREAST BIOPSY     BREAST LUMPECTOMY WITH RADIOACTIVE SEED LOCALIZATION Right 07/24/2018   Procedure: RIGHT BREAST LUMPECTOMY WITH RADIOACTIVE SEED LOCALIZATION;  Surgeon: Abigail Miyamoto, MD;  Location: [facility];  Service: General;  Laterality: Right;   IR RADIOLOGIST EVAL & MGMT  07/28/2020   MASTECTOMY  5/06   left   PARTIAL HYSTERECTOMY     PORT-A-CATH REMOVAL     PORTACATH PLACEMENT      Social History: Social History   Socioeconomic History   Marital status: Married    Spouse name: Not on file   Number of children: 2   Years of education: Not on file   Highest education level: Not on file  Occupational History    Employer: [employer]  Tobacco Use   Smoking status: Never   Smokeless tobacco: Never  Vaping Use  Vaping Use: Never used  Substance and Sexual Activity   Alcohol use: No   Drug use: No   Sexual activity: Yes    Birth control/protection: Surgical  Other Topics Concern   Not on file  Social History Narrative   Not on file   Social Determinants of Health   Financial Resource Strain: Low Risk  (07/20/2020)   Overall Financial Resource Strain (CARDIA)    Difficulty of Paying Living Expenses: Not hard at all  Food Insecurity: No Food Insecurity (07/20/2020)   Hunger Vital Sign    Worried About Running Out of  Food in the Last Year: Never true    Ran Out of Food in the Last Year: Never true  Transportation Needs: No Transportation Needs (07/20/2020)   PRAPARE - Administrator, Civil Service (Medical): No    Lack of Transportation (Non-Medical): No  Physical Activity: Inactive (07/20/2020)   Exercise Vital Sign    Days of Exercise per Week: 0 days    Minutes of Exercise per Session: 0 min  Stress: No Stress Concern Present (07/20/2020)   [employer] of Occupational Health - Occupational Stress Questionnaire    Feeling of Stress : Not at all  Social Connections: Moderately Integrated (07/20/2020)   Social Connection and Isolation Panel [NHANES]    Frequency of Communication with Friends and Family: More than three times a week    Frequency of Social Gatherings with Friends and Family: Never    Attends Religious Services: More than 4 times per year    Active Member of [employer] or Organizations: No    Attends Banker Meetings: Never    Marital Status: Married  Catering manager Violence: Not At Risk (07/20/2020)   Humiliation, Afraid, Rape, and Kick questionnaire    Fear of Current or Ex-Partner: No    Emotionally Abused: No    Physically Abused: No    Sexually Abused: No    Family History: Family History  Problem Relation Age of Onset   Dementia Mother    Stomach cancer Father    Cancer Brother    Cancer Brother    Diabetes Brother    Colitis Daughter    Hypertension Daughter    Hypertension Daughter     Current Medications:  Current Outpatient Medications:    aspirin EC 81 MG tablet, Take 81 mg by mouth daily. Swallow whole., Disp: , Rfl:    atenolol (TENORMIN) 25 MG tablet, Take 25 mg by mouth daily., Disp: , Rfl:    calcium carbonate (TUMS - DOSED IN MG ELEMENTAL CALCIUM) 500 MG chewable tablet, Chew 2 tablets by mouth 2 (two) times daily., Disp: , Rfl:    hydrochlorothiazide (HYDRODIURIL) 25 MG tablet, Take 25 mg by mouth daily., Disp: , Rfl:     lenalidomide (REVLIMID) 10 MG capsule, TAKE 1 CAPSULE BY MOUTH EVERY DAY FOR 3 WEEKS ON THEN 1 WEEK OFF FOR A 28 DAY SUPPLY, Disp: 21 capsule, Rfl: 0   potassium chloride (KLOR-CON) 10 MEQ tablet, Take 2 tablets (20 mEq total) by mouth 2 (two) times daily., Disp: 180 tablet, Rfl: 3   Vitamin D, Ergocalciferol, (DRISDOL) 1.25 MG (50000 UNIT) CAPS capsule, TAKE 1 CAPSULE BY MOUTH ONE TIME PER WEEK, Disp: 12 capsule, Rfl: 1   Allergies: No Known Allergies  REVIEW OF SYSTEMS:   Review of Systems  Constitutional:  Negative for chills, fatigue and fever.  HENT:   Negative for lump/mass, mouth sores, nosebleeds, sore throat and trouble swallowing.  Eyes:  Negative for eye problems.  Respiratory:  Negative for cough and shortness of breath.   Cardiovascular:  Negative for chest pain, leg swelling and palpitations.  Gastrointestinal:  Negative for abdominal pain, constipation, diarrhea, nausea and vomiting.  Genitourinary:  Negative for bladder incontinence, difficulty urinating, dysuria, frequency, hematuria and nocturia.   Musculoskeletal:  Positive for back pain. Negative for arthralgias, flank pain, myalgias and neck pain.  Skin:  Negative for itching and rash.  Neurological:  Positive for numbness. Negative for dizziness and headaches.  Hematological:  Does not bruise/bleed easily.  Psychiatric/Behavioral:  Negative for depression, sleep disturbance and suicidal ideas. The patient is not nervous/anxious.   All other systems reviewed and are negative.    VITALS:   Blood pressure (!) 148/72, pulse 64, temperature 97.8 F (36.6 C), temperature source Oral, resp. rate 18, weight 161 lb 12.8 oz (73.4 kg), SpO2 100 %.  Wt Readings from Last 3 Encounters:  01/17/23 161 lb 12.8 oz (73.4 kg)  12/12/22 161 lb 12.8 oz (73.4 kg)  09/19/22 155 lb 9.6 oz (70.6 kg)    Body mass index is 26.12 kg/m.  Performance status (ECOG): 1 - Symptomatic but completely ambulatory  PHYSICAL EXAM:   Physical  Exam Vitals and nursing note reviewed. Exam conducted with a chaperone present.  Constitutional:      Appearance: Normal appearance.  Cardiovascular:     Rate and Rhythm: Normal rate and regular rhythm.     Heart sounds: Normal heart sounds.  Pulmonary:     Effort: Pulmonary effort is normal.     Breath sounds: Normal breath sounds.  Abdominal:     Palpations: Abdomen is soft. There is no hepatomegaly or splenomegaly.  Musculoskeletal:     Right lower leg: No edema.     Left lower leg: No edema.  Neurological:     General: No focal deficit present.     Mental Status: She is alert and oriented to person, place, and time.  Psychiatric:        Mood and Affect: Mood normal.        Behavior: Behavior normal.     LABS:      Latest Ref Rng & Units 12/05/2022    1:22 PM 09/12/2022    2:23 PM 06/20/2022   10:53 AM  CBC  WBC 4.0 - 10.5 K/uL 3.2  3.2  3.1   Hemoglobin 12.0 - 15.0 g/dL 62.3  76.2  83.1   Hematocrit 36.0 - 46.0 % 36.3  36.1  36.7   Platelets 150 - 400 K/uL 190  184  187       Latest Ref Rng & Units 12/12/2022    1:55 PM 12/05/2022    1:22 PM 09/12/2022    2:23 PM  CMP  Glucose 70 - 99 mg/dL  67  79   BUN 8 - 23 mg/dL  14  18   Creatinine 5.17 - 1.00 mg/dL  6.16  0.73   Sodium 710 - 145 mmol/L  133  136   Potassium 3.5 - 5.1 mmol/L  3.3  3.1   Chloride 98 - 111 mmol/L  99  100   CO2 22 - 32 mmol/L  26  30   Calcium 8.7 - 10.3 mg/dL 9.4  62.6  8.6   Total Protein 6.5 - 8.1 g/dL  7.1  7.0   Total Bilirubin 0.3 - 1.2 mg/dL  0.6  0.9   Alkaline Phos 38 - 126 U/L  83  85  AST 15 - 41 U/L  23  38   ALT 0 - 44 U/L  24  42      No results found for: "CEA1", "CEA" / No results found for: "CEA1", "CEA" No results found for: "PSA1" No results found for: "ZOX096" No results found for: "CAN125"  Lab Results  Component Value Date   TOTALPROTELP 6.7 12/05/2022   TOTALPROTELP 6.5 12/05/2022   ALBUMINELP 3.6 12/05/2022   A1GS 0.2 12/05/2022   A2GS 0.7 12/05/2022    BETS 1.0 12/05/2022   GAMS 1.2 12/05/2022   MSPIKE Not Observed 12/05/2022   SPEI Comment 12/05/2022   No results found for: "TIBC", "FERRITIN", "IRONPCTSAT" Lab Results  Component Value Date   LDH 102 12/05/2022   LDH 118 06/20/2022   LDH 107 03/28/2022     STUDIES:   DG Ribs Bilateral W/Chest  Result Date: 01/08/2023 CLINICAL DATA:  BILATERAL rib pain, pain under breast that wraps around, history LEFT breast cancer, multiple myeloma EXAM: BILATERAL RIBS AND CHEST - 4+ VIEW COMPARISON:  Chest radiograph 02/28/2005 FINDINGS: Normal heart size and pulmonary vascularity. Tortuous thoracic aorta. Lungs clear. No infiltrate, pleural effusion, or pneumothorax. Bones demineralized. Levoconvex scoliosis thoracic spine with scattered degenerative disc disease changes. Questionable sclerotic focus at the anterior RIGHT ninth rib, cannot exclude sclerotic metastasis. No rib fracture or bone destruction. Rounded and ovoid densities in the upper abdomen question medication tablets. IMPRESSION: Questionable subtle sclerotic focus in the anterior RIGHT rib, cannot exclude a sclerotic metastasis; consider bone scan assessment. No additional focal osseous abnormalities. Electronically Signed   By: Ulyses Southward M.D.   On: 01/08/2023 16:30   CT SOFT TISSUE NECK W CONTRAST  Result Date: 01/04/2023 CLINICAL DATA:  Neck swelling. EXAM: CT NECK WITH CONTRAST TECHNIQUE: Multidetector CT imaging of the neck was performed using the standard protocol following the bolus administration of intravenous contrast. RADIATION DOSE REDUCTION: This exam was performed according to the departmental dose-optimization program which includes automated exposure control, adjustment of the mA and/or kV according to patient size and/or use of iterative reconstruction technique. CONTRAST:  75mL OMNIPAQUE IOHEXOL 300 MG/ML  SOLN COMPARISON:  None. FINDINGS: Pharynx and larynx: Glottis is closed. Cystic lesion extending posterior to the  hyoid bone and anterior to the thyroid cartilage, measuring up to 3.1 x 1.8 x 3.6 cm (image 57 series 2, sagittal image 49 series 5), consistent with a thyroglossal duct cyst. Mild mass effect on the supraglottic airway. Salivary glands: No inflammation, mass, or stone. Thyroid: Unremarkable. Lymph nodes: No suspicious cervical lymphadenopathy. Vascular: Unremarkable. Limited intracranial: Unremarkable. Visualized orbits: Unremarkable. Mastoids and visualized paranasal sinuses: Well aerated. Skeleton: Prominent internal maxillary torus (normal variant). Upper chest: Scarring in the left lung apex. Other: None. 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 Mild mass effect on the supraglottic airway. Electronically Signed   By: Orvan Falconer M.D.   On: 01/04/2023 12:35       ASSESSMENT & PLAN:   Assessment: 1. 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  Revlimid started on 10/06/2019 due to delay. -Velcade held since 12/15/2019 due to gastric symptoms, which have resolved completely. -She was evaluated by Dr. Donnie Coffin at [facility].  Recommended to have immediate bone marrow transplant. -She has compression fractures of T8, T10 and T12.  Also L1 and L3 compression fractures. -Revlimid and dexamethasone continued until 03/22/2020. 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 -Revlimid 10 mg daily started around first week of December 2021. - Revlimid dose decreased to 10 mg 3 weeks on 1 week off on 01/04/2021 due to worsening back pain.   2.  Upper/mid back pain: -MRI on 07/13/2020 shows T7, T8, T12, L1 and L2 compression/superior endplate fractures.  Height loss is greatest and moderate at T7 and T8.  No evidence of bone lesions. - She was previously evaluated by IR for vertebroplasty which was not  recommended.   Plan: 1.  IgA lambda plasma cell myeloma: - Labs from 12/05/2022 did not detect M spike.  Immunofixation shows unremarkable.  Free light chain ratio is 1.46.  Lambda light chains at 30.8. CMP showed elevated calcium of 11.1.  - She reports taking 2 tablets of Tums daily amounting to 1000 mg.  She also takes vitamin D 50,000 units weekly for the past couple of years. - Labs from 12/12/2022 showed elevated vitamin D level of 106.93, recommend to decrease dose of vitamin D supplementation.  - RTC 3 months for follow-up with repeat myeloma labs.   2.  Hypokalemia: - Continue potassium 20 mEq twice daily.  Potassium from 12/05/2022 was 3.3.   3.  Bone protection: - Last dose of Zometa was 12/12/2022, potentially last dose.  Will restart Zometa if any recurrence of myeloma.   4.  Thromboprophylaxis: - Continue aspirin 81 mg daily.   5.  Neuropathy: - Neuropathy in the feet is stable.   6.  Hypomagnesemia: --Continue magnesium every other day.  If there is any worsening of diarrhea, she may discontinue magnesium.  7. Anterior rib pain below breasts: -- Improved since initial report.  -- Rib xrays from 01/08/2023 showed questionable subtle sclerotic focus in the anterior right rib, cannot exclude sclerotic metastasis.  --Will order bone scan for further evaluation.    I have spent a total of 25 minutes minutes of face-to-face and non-face-to-face time, preparing to see the patient, performing a medically appropriate examination, counseling and educating the patient, ordering tests/procedures,  documenting clinical information in the electronic health record, and care coordination.   Georga Kaufmann PA-C Dept of Hematology and Oncology [facility]

## 2023-01-27 ENCOUNTER — Other Ambulatory Visit: Payer: Self-pay | Admitting: Hematology

## 2023-01-27 DIAGNOSIS — C9001 Multiple myeloma in remission: Secondary | ICD-10-CM

## 2023-01-28 ENCOUNTER — Encounter (HOSPITAL_COMMUNITY): Payer: Self-pay | Admitting: Hematology

## 2023-01-28 ENCOUNTER — Other Ambulatory Visit: Payer: Self-pay

## 2023-01-28 DIAGNOSIS — C9001 Multiple myeloma in remission: Secondary | ICD-10-CM

## 2023-01-28 MED ORDER — LENALIDOMIDE 10 MG PO CAPS
ORAL_CAPSULE | ORAL | 0 refills | Status: DC
Start: 2023-01-28 — End: 2023-02-27

## 2023-01-28 NOTE — Telephone Encounter (Signed)
Chart reviewed. Revlimid refilled per verbal order from Dr. Katragadda. 

## 2023-01-31 ENCOUNTER — Encounter (HOSPITAL_COMMUNITY)
Admission: RE | Admit: 2023-01-31 | Discharge: 2023-01-31 | Disposition: A | Discharge status: Home / Self Care | Payer: Medicare HMO | Source: Ambulatory Visit | Attending: Physician Assistant | Admitting: Physician Assistant

## 2023-01-31 DIAGNOSIS — R0781 Pleurodynia: Secondary | ICD-10-CM | POA: Diagnosis present

## 2023-01-31 MED ORDER — TECHNETIUM TC 99M MEDRONATE IV KIT
20.0000 | PACK | Freq: Once | INTRAVENOUS | Status: AC | PRN
  Administered 2023-01-31: 19.1 via INTRAVENOUS

## 2023-02-06 ENCOUNTER — Inpatient Hospital Stay (HOSPITAL_BASED_OUTPATIENT_CLINIC_OR_DEPARTMENT_OTHER): Payer: Medicare HMO | Admitting: Hematology

## 2023-02-06 ENCOUNTER — Encounter: Payer: Self-pay | Admitting: Hematology

## 2023-02-06 DIAGNOSIS — C9000 Multiple myeloma not having achieved remission: Secondary | ICD-10-CM | POA: Diagnosis not present

## 2023-02-06 NOTE — Progress Notes (Signed)
Virtual Visit via Telephone Note  I connected with Stephanie Galvan on 02/06/23 at  4:00 PM EDT by telephone and verified that I am speaking with the correct person using two identifiers.  Location: Patient: At home Provider: In the office   I discussed the limitations, risks, security and privacy concerns of performing an evaluation and management service by telephone and the availability of in person appointments. I also discussed with the patient that there may be a patient responsible charge related to this service. The patient expressed understanding and agreed to proceed.   History of Present Illness: Patient seen in our clinic for IgA lambda plasma cell myeloma.  She underwent stem cell transplant on 04/29/2020 and has been on Revlimid maintenance since then.   Observations/Objective: She was evaluated in our clinic on 01/17/2023 for new onset rib pain.  She reports that the pain has completely resolved.  Assessment and Plan:  1.  Multiple myeloma: - We reviewed rib x-rays from 01/08/2023: No lytic lesions.  Questionable subtle sclerotic focus in the right anterior rib. - Bone scan from 01/31/2023: No suspicious abnormal uptake. - Her myeloma labs from 12/05/2022 were within normal limits.  Will plan to repeat myeloma labs in June.  2.  Hypercalcemia: - Calcium was 11.1 on 12/05/2022.  Vitamin D was elevated at 1046 on 12/12/2022.  Intact PTH was 22. - She is taking Tums 1 tablet twice daily and vitamin D 50,000 units weekly. - I have recommended that she stop vitamin D and Tums. - We will repeat labs on 03/05/2023 as was scheduled.   Follow Up Instructions: Follow-up with labs in June.   I discussed the assessment and treatment plan with the patient. The patient was provided an opportunity to ask questions and all were answered. The patient agreed with the plan and demonstrated an understanding of the instructions.   The patient was advised to call back or seek an in-person  evaluation if the symptoms worsen or if the condition fails to improve as anticipated.  I provided 21 minutes of non-face-to-face time during this encounter.   [name], MD

## 2023-02-20 ENCOUNTER — Other Ambulatory Visit: Payer: Self-pay | Admitting: Hematology

## 2023-02-20 DIAGNOSIS — C9001 Multiple myeloma in remission: Secondary | ICD-10-CM

## 2023-02-26 ENCOUNTER — Other Ambulatory Visit: Payer: Self-pay

## 2023-02-26 ENCOUNTER — Inpatient Hospital Stay: Payer: Medicare HMO | Attending: Hematology | Admitting: Hematology

## 2023-02-26 DIAGNOSIS — C9000 Multiple myeloma not having achieved remission: Secondary | ICD-10-CM

## 2023-02-26 DIAGNOSIS — R5383 Other fatigue: Secondary | ICD-10-CM | POA: Insufficient documentation

## 2023-02-26 DIAGNOSIS — Z853 Personal history of malignant neoplasm of breast: Secondary | ICD-10-CM | POA: Insufficient documentation

## 2023-02-26 DIAGNOSIS — R1030 Lower abdominal pain, unspecified: Secondary | ICD-10-CM | POA: Insufficient documentation

## 2023-02-26 DIAGNOSIS — C9001 Multiple myeloma in remission: Secondary | ICD-10-CM

## 2023-02-26 DIAGNOSIS — Z79899 Other long term (current) drug therapy: Secondary | ICD-10-CM | POA: Diagnosis not present

## 2023-02-26 LAB — COMPREHENSIVE METABOLIC PANEL
ALT: 21 U/L (ref 0–44)
AST: 24 U/L (ref 15–41)
Albumin: 3.4 g/dL — ABNORMAL LOW (ref 3.5–5.0)
Alkaline Phosphatase: 93 U/L (ref 38–126)
Anion gap: 9 (ref 5–15)
BUN: 12 mg/dL (ref 8–23)
CO2: 28 mmol/L (ref 22–32)
Calcium: 8 mg/dL — ABNORMAL LOW (ref 8.9–10.3)
Chloride: 99 mmol/L (ref 98–111)
Creatinine, Ser: 0.82 mg/dL (ref 0.44–1.00)
GFR, Estimated: >60 mL/min (ref >60)
Glucose, Bld: 89 mg/dL (ref 70–99)
Potassium: 2.7 mmol/L — CL (ref 3.5–5.1)
Sodium: 136 mmol/L (ref 135–145)
Total Bilirubin: 0.6 mg/dL (ref 0.3–1.2)
Total Protein: 7.1 g/dL (ref 6.5–8.1)

## 2023-02-26 LAB — CBC WITH DIFFERENTIAL/PLATELET
Abs Immature Granulocytes: 0.01 10*3/uL (ref 0.00–0.07)
Basophils Absolute: 0 10*3/uL (ref 0.0–0.1)
Basophils Relative: 1 %
Eosinophils Absolute: 0.1 10*3/uL (ref 0.0–0.5)
Eosinophils Relative: 3 %
HCT: 34.5 % — ABNORMAL LOW (ref 36.0–46.0)
Hemoglobin: 11.4 g/dL — ABNORMAL LOW (ref 12.0–15.0)
Immature Granulocytes: 0 %
Lymphocytes Relative: 33 %
Lymphs Abs: 0.9 10*3/uL (ref 0.7–4.0)
MCH: 31.6 pg (ref 26.0–34.0)
MCHC: 33 g/dL (ref 30.0–36.0)
MCV: 95.6 fL (ref 80.0–100.0)
Monocytes Absolute: 0.3 10*3/uL (ref 0.1–1.0)
Monocytes Relative: 10 %
Neutro Abs: 1.4 10*3/uL — ABNORMAL LOW (ref 1.7–7.7)
Neutrophils Relative %: 53 %
Platelets: 205 10*3/uL (ref 150–400)
RBC: 3.61 MIL/uL — ABNORMAL LOW (ref 3.87–5.11)
RDW: 14 % (ref 11.5–15.5)
WBC: 2.7 10*3/uL — ABNORMAL LOW (ref 4.0–10.5)
nRBC: 0 % (ref 0.0–0.2)

## 2023-02-26 LAB — MAGNESIUM: Magnesium: 1.7 mg/dL (ref 1.7–2.4)

## 2023-02-26 LAB — LACTATE DEHYDROGENASE: LDH: 98 U/L (ref 98–192)

## 2023-02-26 NOTE — Progress Notes (Signed)
CRITICAL VALUE ALERT Critical value received:  potassium 2.7 Date of notification:  02-26-2023 Time of notification: 14:05 pm Critical value read back:  Yes.   Nurse who received alert:  B.Mayjor Ager RN.  MD notified time and response:  Dr. Ellin Saba @ 14:10pm.   Orders received to instruct patient to take an additional 40 mEq of potassium 1 time today. Patient scheduled for 20 mEq of IV potassium 02-27-2023 and 40 mEq of PO potassium pre and post IV potassium.   Spoke with the patient pertaining to plan of care. Understanding verbalized. Patient aware of 11:00 am appointment time with understanding verbalized to take an additional 40 mEq of her potassium.

## 2023-02-27 ENCOUNTER — Encounter (HOSPITAL_COMMUNITY): Payer: Self-pay | Admitting: Hematology

## 2023-02-27 ENCOUNTER — Other Ambulatory Visit: Payer: Self-pay

## 2023-02-27 ENCOUNTER — Inpatient Hospital Stay: Payer: Medicare HMO

## 2023-02-27 ENCOUNTER — Other Ambulatory Visit: Payer: Medicare HMO

## 2023-02-27 DIAGNOSIS — C9001 Multiple myeloma in remission: Secondary | ICD-10-CM

## 2023-02-27 DIAGNOSIS — C9000 Multiple myeloma not having achieved remission: Secondary | ICD-10-CM | POA: Diagnosis not present

## 2023-02-27 LAB — KAPPA/LAMBDA LIGHT CHAINS
Kappa free light chain: 41.2 mg/L — ABNORMAL HIGH (ref 3.3–19.4)
Kappa, lambda light chain ratio: 1.34 (ref 0.26–1.65)
Lambda free light chains: 30.7 mg/L — ABNORMAL HIGH (ref 5.7–26.3)

## 2023-02-27 MED ORDER — POTASSIUM CHLORIDE IN NACL 20-0.9 MEQ/L-% IV SOLN
INTRAVENOUS | Status: DC
  Filled 2023-02-27 (×2): qty 1000

## 2023-02-27 MED ORDER — POTASSIUM CHLORIDE CRYS ER 20 MEQ PO TBCR
40.0000 meq | EXTENDED_RELEASE_TABLET | Freq: Once | ORAL | Status: AC
  Administered 2023-02-27: 40 meq via ORAL
  Filled 2023-02-27: qty 2

## 2023-02-27 MED ORDER — POTASSIUM CHLORIDE CRYS ER 20 MEQ PO TBCR: 40.0000 meq | EXTENDED_RELEASE_TABLET | Freq: Once | ORAL | Status: DC

## 2023-02-27 MED ORDER — LENALIDOMIDE 10 MG PO CAPS
ORAL_CAPSULE | ORAL | 0 refills | Status: DC
Start: 2023-02-27 — End: 2023-04-16

## 2023-02-27 NOTE — Progress Notes (Signed)
Patient presents today for 20 mEq IV potassium chloride and K-DUR 40 mEq pre and post IV potassium Patient denies any chest pain or cramping. Patient has no complaints today. MAR reviewed and updated. Patient taking Revlimid as prescribed. Reviewed instructions with patient on her My Chart of how to take Revlimid/current cycle and then hold until after procedure per MyChart notes to patient from Westley Hummer RN / Dr. Ellin Saba. Understanding verbalized by patient .   20 mEq of potassium chloride given today per MD orders. Tolerated infusion without adverse affects. Vital signs stable. No complaints at this time. Discharged from clinic ambulatory in stable condition. Alert and oriented x 3. F/U with [facility] as scheduled.

## 2023-02-27 NOTE — Patient Instructions (Signed)
[facility]  Discharge Instructions: Thank you for choosing [facility] to provide your oncology and hematology care.  If you have a lab appointment with the Cancer Center - please note that after April 8th, 2024, all labs will be drawn in the cancer center.  You do not have to check in or register with the main entrance as you have in the past but will complete your check-in in the cancer center.  Wear comfortable clothing and clothing appropriate for easy access to any Portacath or PICC line.   We strive to give you quality time with your provider. You may need to reschedule your appointment if you arrive late (15 or more minutes).  Arriving late affects you and other patients whose appointments are after yours.  Also, if you miss three or more appointments without notifying the office, you may be dismissed from the clinic at the provider's discretion.      For prescription refill requests, have your pharmacy contact our office and allow 72 hours for refills to be completed.    Today you received the following chemotherapy and/or immunotherapy agents 20 mEq of IV potassium chloride.        To help prevent nausea and vomiting after your treatment, we encourage you to take your nausea medication as directed.  BELOW ARE SYMPTOMS THAT SHOULD BE REPORTED IMMEDIATELY: *FEVER GREATER THAN 100.4 F (38 C) OR HIGHER *CHILLS OR SWEATING *NAUSEA AND VOMITING THAT IS NOT CONTROLLED WITH YOUR NAUSEA MEDICATION *UNUSUAL SHORTNESS OF BREATH *UNUSUAL BRUISING OR BLEEDING *URINARY PROBLEMS (pain or burning when urinating, or frequent urination) *BOWEL PROBLEMS (unusual diarrhea, constipation, pain near the anus) TENDERNESS IN MOUTH AND THROAT WITH OR WITHOUT PRESENCE OF ULCERS (sore throat, sores in mouth, or a toothache) UNUSUAL RASH, SWELLING OR PAIN  UNUSUAL VAGINAL DISCHARGE OR ITCHING   Items with * indicate a potential emergency and should be followed up as soon  as possible or go to the Emergency Department if any problems should occur.  Please show the CHEMOTHERAPY ALERT CARD or IMMUNOTHERAPY ALERT CARD at check-in to the Emergency Department and triage nurse.  Should you have questions after your visit or need to cancel or reschedule your appointment, please contact [facility] [phone]  and follow the prompts.  Office hours are 8:00 a.m. to 4:30 p.m. Monday - Friday. Please note that voicemails left after 4:00 p.m. may not be returned until the following business day.  We are closed weekends and major holidays. You have access to a nurse at all times for urgent questions. Please call the main number to the clinic [phone] and follow the prompts.  For any non-urgent questions, you may also contact your provider using MyChart. We now offer e-Visits for anyone 53 and older to request care online for non-urgent symptoms. For details visit [email or web address].   Also download the MyChart app! Go to the app store, search "MyChart", open the app, select [place], and log in with your MyChart username and password.

## 2023-02-27 NOTE — Telephone Encounter (Signed)
Chart reviewed. Revlimid refill sent per verbal order from Dr. Ellin Saba; however, Dr. Ellin Saba would like for the patient to hold off on restarting Revlimid cycle until after upcoming procedure on July 1st. MyChart message sent to patient with those details.

## 2023-02-28 LAB — IGG, IGA, IGM
IgA: 221 mg/dL (ref 87–352)
IgG (Immunoglobin G), Serum: 1193 mg/dL (ref 586–1602)
IgM (Immunoglobulin M), Srm: 34 mg/dL (ref 26–217)

## 2023-02-28 LAB — BETA 2 MICROGLOBULIN, SERUM: Beta-2 Microglobulin: 2.2 mg/L (ref 0.6–2.4)

## 2023-03-01 LAB — PROTEIN ELECTROPHORESIS, SERUM
A/G Ratio: 1 (ref 0.7–1.7)
Albumin ELP: 3.4 g/dL (ref 2.9–4.4)
Alpha-1-Globulin: 0.3 g/dL (ref 0.0–0.4)
Alpha-2-Globulin: 0.8 g/dL (ref 0.4–1.0)
Beta Globulin: 1.1 g/dL (ref 0.7–1.3)
Gamma Globulin: 1.2 g/dL (ref 0.4–1.8)
Globulin, Total: 3.4 g/dL (ref 2.2–3.9)
Total Protein ELP: 6.8 g/dL (ref 6.0–8.5)

## 2023-03-04 LAB — IMMUNOFIXATION ELECTROPHORESIS
IgA: 226 mg/dL (ref 87–352)
IgG (Immunoglobin G), Serum: 1191 mg/dL (ref 586–1602)
IgM (Immunoglobulin M), Srm: 36 mg/dL (ref 26–217)
Total Protein ELP: 6.4 g/dL (ref 6.0–8.5)

## 2023-03-05 ENCOUNTER — Inpatient Hospital Stay: Payer: Medicare HMO

## 2023-03-06 ENCOUNTER — Ambulatory Visit: Payer: Medicare HMO

## 2023-03-06 ENCOUNTER — Other Ambulatory Visit: Payer: Medicare HMO

## 2023-03-06 ENCOUNTER — Ambulatory Visit: Payer: Medicare HMO | Admitting: Hematology

## 2023-03-07 ENCOUNTER — Other Ambulatory Visit: Payer: Self-pay

## 2023-03-07 ENCOUNTER — Encounter (HOSPITAL_COMMUNITY): Payer: Self-pay | Admitting: Otolaryngology

## 2023-03-07 NOTE — Progress Notes (Addendum)
SDW CALL  Patient was given pre-op instructions over the phone. The opportunity was given for the patient to ask questions. No further questions asked. Patient verbalized understanding of instructions given.   PCP - Dr.Steven Sudie Bailey Cardiologist - none  PPM/ICD - denies Device Orders -  Rep Notified -   Chest x-ray - none EKG - 04/27/20 Stress Test - none ECHO - 12/26/05 Cardiac Cath -none   Sleep Study - none CPAP - no  Fasting Blood Sugar - na Checks Blood Sugar _____ times a day  Blood Thinner Instructions:na Aspirin Instructions:pt states she will call Dr. Lucky Rathke office for instructions.   ERAS Protcol -clears until 0730 PRE-SURGERY Ensure or G2- no  COVID TEST-na    Anesthesia review: yes- pt current oral chemotherapy. Pt is taking Revlimid and is on a 21 day cycle. She has called the [facility] to clarify when she is to stop taking her Revlimid as she currently in her 3 week cycle of taking the drug. She said she would stop taking it Sunday.   Patient denies shortness of breath, fever, cough and chest pain over the phone call  Surgical Instructions    Your procedure is scheduled on Monday July1.  Report to [facility] Main Entrance "A" at 8:00 A.M., then check in with the Admitting office.  Call this number if you have problems the morning of surgery:  [phone]    Remember:  Do not eat after midnight the night before your surgery  You may drink clear liquids until 0730 the morning of your surgery.   Clear liquids allowed are: Water, Non-Citrus Juices (without pulp), Carbonated Beverages, Clear Tea, Black Coffee ONLY (NO MILK, CREAM OR POWDERED CREAMER of any kind), and Gatorade   Take these medicines the morning of surgery with A SIP OF WATER: Atenolol  As of today, STOP taking any Aspirin (unless otherwise instructed by your surgeon) Aleve, Naproxen, Ibuprofen, Motrin, Advil, Goody's, BC's, all herbal medications, fish oil, and all  vitamins.   is not responsible for any belongings or valuables. .   Do NOT Smoke (Tobacco/Vaping)  24 hours prior to your procedure  If you use a CPAP at night, you may bring your mask for your overnight stay.   Contacts, glasses, hearing aids, dentures or partials may not be worn into surgery, please bring cases for these belongings   Patients discharged the day of surgery will not be allowed to drive home, and someone needs to stay with them for 24 hours.   SURGICAL WAITING ROOM VISITATION You may have 1 visitor in the pre-op area at a time determined by the pre-op nurse. (Visitor may not switch out.)  Special instructions:    Oral Hygiene is also important to reduce your risk of infection.  Remember - BRUSH YOUR TEETH THE MORNING OF SURGERY WITH YOUR REGULAR TOOTHPASTE   Day of Surgery:  Take a shower the day of or night before with antibacterial soap. Wear Clean/Comfortable clothing the morning of surgery Do not apply any deodorants/lotions.   Do not wear jewelry or makeup Do not wear lotions, powders, perfumes/colognes, or deodorant. Do not shave 48 hours prior to surgery.  Men may shave face and neck. Do not bring valuables to the hospital. Do not wear nail polish, gel polish, artificial nails, or any other type of covering on natural nails (fingers and toes) If you have artificial nails or gel coating that need to be removed by a nail salon, please have this  removed prior to surgery. Artificial nails or gel coating may interfere with anesthesia's ability to adequately monitor your vital signs. Remember to brush your teeth WITH YOUR REGULAR TOOTHPASTE.

## 2023-03-08 ENCOUNTER — Other Ambulatory Visit: Payer: Self-pay | Admitting: Hematology

## 2023-03-08 DIAGNOSIS — C9000 Multiple myeloma not having achieved remission: Secondary | ICD-10-CM

## 2023-03-08 NOTE — H&P (Signed)
HPI:  Stephanie Galvan is a 67 y.o. female who presents as a consult Patient.  Referring Provider: No ref. provider found  Chief complaint: Neck mass.  HPI: She noticed an anterior neck mass couple months ago. She had a CT evaluation. History of multiple myeloma, status post bone marrow transplant doing very well. She does not smoke. She is not on blood thinner. No cardiac history.  PMH/Meds/All/SocHx/FamHx/ROS:  Past Medical History: Diagnosis Date H/O multiple myeloma  Past Surgical History: Procedure Laterality Date BONE MARROW TRANSPLANT 2021  No family history of bleeding disorders, wound healing problems or difficulty with anesthesia.    Current Outpatient Medications: aspirin 81 mg EC tablet, Take 81 mg by mouth., Disp: , Rfl: atenoloL (TENORMIN) 25 mg tablet, Take 25 mg by mouth daily., Disp: , Rfl: calcium carbonate (TUMS EX) 300 mg (750 mg) chewable tablet, Chew every 2 (two) hours as needed., Disp: , Rfl: ergocalciferol (VITAMIN D2) 1,250 mcg (50,000 unit) capsule, Take 1 capsule by mouth once a week., Disp: , Rfl: hydroCHLOROthiazide (HYDRODIURIL) 25 mg tablet, , Disp: , Rfl: lenalidomide (REVLIMID) 10 mg cap, TAKE 1 CAPSULE BY MOUTH EVERY DAY FOR 3 WEEKS ON THEN 1 WEEK OFF FOR A 28 DAY SUPPLY, Disp: , Rfl: potassium chloride (KLOR-CON) 10 mEq ER tablet, Take 20 mEq by mouth., Disp: , Rfl:  A complete ROS was performed with pertinent positives/negatives noted in the HPI. The remainder of the ROS are negative.   Physical Exam:  Pulse 64  Temp 97.4 F (36.3 C) (Temporal)  Resp 18  Ht 1.651 m (5\' 5" )  Wt 72.8 kg (160 lb 6.4 oz)  SpO2 98%  BMI 26.69 kg/m  General: Healthy and alert, in no distress, breathing easily. Normal affect. In a pleasant mood. Head: Normocephalic, atraumatic. No masses, or scars. Eyes: Pupils are equal, and reactive to light. Vision is grossly intact. No spontaneous or gaze nystagmus. Ears: Ear canals are clear. Tympanic membranes are  intact, with normal landmarks and the middle ears are clear and healthy. Hearing: Grossly normal. Nose: Nasal cavities are clear with healthy mucosa, no polyps or exudate. Airways are patent. Face: No masses or scars, facial nerve function is symmetric. Oral Cavity: No mucosal abnormalities are noted. Tongue with normal mobility. Dentition appears healthy. Oropharynx: Tonsils are symmetric. There are no mucosal masses identified. Tongue base appears normal and healthy. Larynx/Hypopharynx: indirect exam reveals healthy, mobile vocal cords, without mucosal lesions in the hypopharynx or larynx. Chest: Deferred Neck: 3 to 4 cm soft mobile mass at the level of the hyoid, moves with swallowing, otherwise no palpable masses, no cervical adenopathy, no thyroid nodules or enlargement. Neuro: Cranial nerves II-XII with normal function. Balance: Normal gate. Other findings: none.  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 Mild mass effect on the supraglottic airway.  Procedures: none  Impression & Plans: Thyroglossal duct cyst. We discussed the potential for infection, increased growth, carcinoma. Recommend excision of this under anesthesia. Discussed the nature of the surgery, the incision, the need for drain. We discussed that there is a small chance of finding cancer in this. All questions were answered. Will schedule at her convenience.

## 2023-03-08 NOTE — Anesthesia Preprocedure Evaluation (Signed)
Anesthesia Evaluation    Airway        Dental   Pulmonary           Cardiovascular hypertension,      Neuro/Psych    GI/Hepatic   Endo/Other    Renal/GU      Musculoskeletal   Abdominal   Peds  Hematology   Anesthesia Other Findings   Reproductive/Obstetrics                             Anesthesia Physical Anesthesia Plan  ASA:   Anesthesia Plan:    Post-op Pain Management:    Induction:   PONV Risk Score and Plan:   Airway Management Planned:   Additional Equipment:   Intra-op Plan:   Post-operative Plan:   Informed Consent:   Plan Discussed with:   Anesthesia Plan Comments: (PAT note written 03/08/2023 by Isidor Bromell, PA-C.  )       Anesthesia Quick Evaluation  

## 2023-03-08 NOTE — Progress Notes (Signed)
Anesthesia Chart Review: Stephanie Galvan  Case: [id number] Date/Time: 03/11/23 1015   Procedure: EXCISION OF THYROGLOSSAL DUCT CYST   Anesthesia type: General   Pre-op diagnosis: Thyroglossal duct cyst   [facility]: [facility] ROOM 08 / [facility] OR   Surgeons: Stephanie Colonel, MD       DISCUSSION: Patient is a 67 year old female scheduled for the above procedure.  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    She is followed by oncologist Dr. Ellin Galvan for multiple myeloma, last visit 02/06/23. 01/31/23 bone scan without suspicious abnormal uptake. 12/05/22 myeloma labs were within normal limits with plans to repeat in June. Ca 11.1 on 12/05/22 and Vit D 1046 on 12/12/22. Intact PTH was 22. She is on Tums 2 BID and Vit D 50,000 units weekly, so he recommended she stop those and will repeat in June. Her [facility] labs were repeated on 02/26/23. No monoclonal component on IFE Serum. She received KCL IV for K 2.7. Calcium was low at 8.0, so Tum restarted at 1 BID. He asked her to hold off on restarting Revlimid cycle until after 03/11/23 surgery.    Her most recent labs per [facility] oncologist Dr. Barbaraann Galvan are from 03/06/23 (see Care Everywhere) include Magnesium, CMP, CBC. CMP was normal except mild hypokalemia with K 34. Mg 1.7. WBC 3.1, H/H 11.4/34.5, PLT 217.   Anesthesia team to evaluate on the day of procedure. Last EKG noted is > 1 year ago.   VS:  BP Readings from Last 3 Encounters:  02/27/23 (!) 113/47  01/17/23 (!) 151/70  12/12/22 131/73   Pulse Readings from Last 3 Encounters:  02/27/23 (!) 58  01/17/23 64  12/12/22 (!) 56     PROVIDERS: Stephanie Morgan, MD is PCP  Stephanie Massed, MD is HEM-[facility]) and Stephanie Candle, MD ([facility], Stem Cell Transplant Specialist)   LABS: See DISCUSSION. Last labs on 03/06/23 through [facility].   IMAGES: Bone Scan 01/31/23: IMPRESSION: No suspicious abnormal uptake. Specifically, there is no correlate for the recent x-ray finding in the ninth anterior rib on today's study.  DG Ribs w/Chest 01/08/23: IMPRESSION: - Questionable subtle sclerotic focus in the anterior RIGHT rib, cannot exclude a sclerotic metastasis; consider bone scan assessment. - No additional focal osseous abnormalities.  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 Mild mass effect on the supraglottic airway.   EKG: Last EKG seen is > 1 year ago. NSR on 07/27/18.   CV: NM Cardiac Muga Scan 03/20/06: MPRESSION  Left ventricular ejection fraction 60% .   Echo 04/24/05 (in setting of chemotherapy for breast cancer): SUMMARY   -  Overall left ventricular systolic function was normal. Left         ventricular ejection fraction was estimated , range being 55         % to 65 %.. Although no diagnostic left ventricular regional         wall motion abnormality was identified, this possibility         cannot be completely excluded on the basis of this study.   IMPRESSIONS   -  By comparison with the previous study of 02/20/05, no significant  change was apparent.    Past Medical History:  Diagnosis Date   Breast cancer (HCC) 2006   left breast   Breast mass, right    Compression fracture of cervical spine (HCC) 06/18/2019   recent Chest xray stating had fx.   High cholesterol    History of bladder infections    Hypertension    Multiple myeloma (HCC) 2021   bone marrow transplant 2021   Rash 04/2012    Past Surgical History:  Procedure Laterality Date   ABDOMINAL HYSTERECTOMY     just uterus removed   BREAST BIOPSY     BREAST LUMPECTOMY WITH RADIOACTIVE  SEED LOCALIZATION Right 07/24/2018   Procedure: RIGHT BREAST LUMPECTOMY WITH RADIOACTIVE SEED LOCALIZATION;  Surgeon: Stephanie Miyamoto, MD;  Location: [facility];  Service: General;  Laterality: Right;   IR RADIOLOGIST EVAL & MGMT  07/28/2020   MASTECTOMY  5/06   left   PARTIAL HYSTERECTOMY     PORT-A-CATH REMOVAL     PORTACATH PLACEMENT      MEDICATIONS: No current facility-administered medications for this encounter.    aspirin EC 81 MG tablet   atenolol (TENORMIN) 25 MG tablet   calcium carbonate (TUMS - DOSED IN MG ELEMENTAL CALCIUM) 500 MG chewable tablet   hydrochlorothiazide (HYDRODIURIL) 25 MG tablet   lenalidomide (REVLIMID) 10 MG capsule   potassium chloride (KLOR-CON) 10 MEQ tablet   Vitamin D, Ergocalciferol, (DRISDOL) 1.25 MG (50000 UNIT) CAPS capsule    [name], PA-C Surgical Short Stay/Anesthesiology [facility] Phone [phone] [facility] Phone [phone] 03/08/2023 11:14 AM

## 2023-03-11 ENCOUNTER — Other Ambulatory Visit: Payer: Self-pay

## 2023-03-11 ENCOUNTER — Ambulatory Visit (HOSPITAL_COMMUNITY)
Admission: RE | Admit: 2023-03-11 | Discharge: 2023-03-11 | Disposition: A | Discharge status: Home / Self Care | Payer: Medicare HMO | Attending: Otolaryngology | Admitting: Otolaryngology

## 2023-03-11 ENCOUNTER — Encounter (HOSPITAL_COMMUNITY): Payer: Self-pay | Admitting: Otolaryngology

## 2023-03-11 ENCOUNTER — Encounter (HOSPITAL_COMMUNITY): Payer: Self-pay | Admitting: Hematology

## 2023-03-11 ENCOUNTER — Ambulatory Visit (HOSPITAL_BASED_OUTPATIENT_CLINIC_OR_DEPARTMENT_OTHER): Payer: Medicare HMO | Admitting: Vascular Surgery

## 2023-03-11 ENCOUNTER — Encounter (HOSPITAL_COMMUNITY)
Admission: RE | Disposition: A | Discharge status: Home / Self Care | Payer: Self-pay | Source: Home / Self Care | Attending: Otolaryngology

## 2023-03-11 ENCOUNTER — Ambulatory Visit (HOSPITAL_COMMUNITY): Payer: Medicare HMO | Admitting: Vascular Surgery

## 2023-03-11 DIAGNOSIS — I10 Essential (primary) hypertension: Secondary | ICD-10-CM | POA: Diagnosis not present

## 2023-03-11 DIAGNOSIS — Q892 Congenital malformations of other endocrine glands: Secondary | ICD-10-CM

## 2023-03-11 DIAGNOSIS — Z853 Personal history of malignant neoplasm of breast: Secondary | ICD-10-CM | POA: Insufficient documentation

## 2023-03-11 DIAGNOSIS — Z9221 Personal history of antineoplastic chemotherapy: Secondary | ICD-10-CM | POA: Insufficient documentation

## 2023-03-11 DIAGNOSIS — D61818 Other pancytopenia: Secondary | ICD-10-CM | POA: Diagnosis not present

## 2023-03-11 DIAGNOSIS — Z9484 Stem cells transplant status: Secondary | ICD-10-CM | POA: Insufficient documentation

## 2023-03-11 DIAGNOSIS — C9000 Multiple myeloma not having achieved remission: Secondary | ICD-10-CM | POA: Diagnosis not present

## 2023-03-11 DIAGNOSIS — Z79899 Other long term (current) drug therapy: Secondary | ICD-10-CM | POA: Diagnosis not present

## 2023-03-11 HISTORY — PX: THYROGLOSSAL DUCT CYST: SHX297

## 2023-03-11 SURGERY — EXCISION, THYROGLOSSAL DUCT CYST
Anesthesia: General | Site: Neck

## 2023-03-11 MED ORDER — PROPOFOL 10 MG/ML IV BOLUS
INTRAVENOUS | Status: DC | PRN
  Administered 2023-03-11: 120 mg via INTRAVENOUS

## 2023-03-11 MED ORDER — ACETAMINOPHEN 160 MG/5ML PO SOLN: 325.0000 mg | ORAL | Status: DC | PRN

## 2023-03-11 MED ORDER — 0.9 % SODIUM CHLORIDE (POUR BTL) OPTIME
TOPICAL | Status: DC | PRN
  Administered 2023-03-11: 1000 mL

## 2023-03-11 MED ORDER — HYDROCODONE-ACETAMINOPHEN 7.5-325 MG PO TABS: 1.0000 | ORAL_TABLET | Freq: Four times a day (QID) | ORAL | 0 refills | Status: DC | PRN

## 2023-03-11 MED ORDER — OXYCODONE HCL 5 MG PO TABS: 5.0000 mg | ORAL_TABLET | Freq: Once | ORAL | Status: DC | PRN

## 2023-03-11 MED ORDER — ONDANSETRON HCL 4 MG/2ML IJ SOLN
INTRAMUSCULAR | Status: AC
  Filled 2023-03-11: qty 2

## 2023-03-11 MED ORDER — CHLORHEXIDINE GLUCONATE 0.12 % MT SOLN
15.0000 mL | Freq: Once | OROMUCOSAL | Status: AC
  Administered 2023-03-11: 15 mL via OROMUCOSAL
  Filled 2023-03-11: qty 15

## 2023-03-11 MED ORDER — CEPHALEXIN 500 MG PO CAPS: 500.0000 mg | ORAL_CAPSULE | Freq: Three times a day (TID) | ORAL | 0 refills | Status: DC

## 2023-03-11 MED ORDER — OXYCODONE HCL 5 MG/5ML PO SOLN: 5.0000 mg | Freq: Once | ORAL | Status: DC | PRN

## 2023-03-11 MED ORDER — LACTATED RINGERS IV SOLN: INTRAVENOUS | Status: DC

## 2023-03-11 MED ORDER — ORAL CARE MOUTH RINSE: 15.0000 mL | Freq: Once | OROMUCOSAL | Status: AC

## 2023-03-11 MED ORDER — FENTANYL CITRATE (PF) 100 MCG/2ML IJ SOLN: 25.0000 ug | INTRAMUSCULAR | Status: DC | PRN

## 2023-03-11 MED ORDER — DEXAMETHASONE SODIUM PHOSPHATE 10 MG/ML IJ SOLN
INTRAMUSCULAR | Status: AC
  Filled 2023-03-11: qty 1

## 2023-03-11 MED ORDER — SCOPOLAMINE 1 MG/3DAYS TD PT72
1.0000 | MEDICATED_PATCH | TRANSDERMAL | Status: DC
  Administered 2023-03-11: 1.5 mg via TRANSDERMAL
  Filled 2023-03-11: qty 1

## 2023-03-11 MED ORDER — PHENYLEPHRINE 80 MCG/ML (10ML) SYRINGE FOR IV PUSH (FOR BLOOD PRESSURE SUPPORT)
PREFILLED_SYRINGE | INTRAVENOUS | Status: AC
  Filled 2023-03-11: qty 10

## 2023-03-11 MED ORDER — LIDOCAINE-EPINEPHRINE 1 %-1:100000 IJ SOLN
INTRAMUSCULAR | Status: AC
  Filled 2023-03-11: qty 1

## 2023-03-11 MED ORDER — PROMETHAZINE HCL 25 MG/ML IJ SOLN: 6.2500 mg | INTRAMUSCULAR | Status: DC | PRN

## 2023-03-11 MED ORDER — ACETAMINOPHEN 500 MG PO TABS
1000.0000 mg | ORAL_TABLET | Freq: Once | ORAL | Status: AC
  Administered 2023-03-11: 1000 mg via ORAL
  Filled 2023-03-11: qty 2

## 2023-03-11 MED ORDER — BACITRACIN ZINC 500 UNIT/GM EX OINT
TOPICAL_OINTMENT | CUTANEOUS | Status: AC
  Filled 2023-03-11: qty 28.35

## 2023-03-11 MED ORDER — ONDANSETRON 4 MG PO TBDP: 4.0000 mg | ORAL_TABLET | Freq: Three times a day (TID) | ORAL | 0 refills | Status: DC | PRN

## 2023-03-11 MED ORDER — FENTANYL CITRATE (PF) 250 MCG/5ML IJ SOLN
INTRAMUSCULAR | Status: DC | PRN
  Administered 2023-03-11: 100 ug via INTRAVENOUS

## 2023-03-11 MED ORDER — SUGAMMADEX SODIUM 200 MG/2ML IV SOLN
INTRAVENOUS | Status: DC | PRN
  Administered 2023-03-11: 200 mg via INTRAVENOUS

## 2023-03-11 MED ORDER — ACETAMINOPHEN 10 MG/ML IV SOLN: 1000.0000 mg | Freq: Once | INTRAVENOUS | Status: DC | PRN

## 2023-03-11 MED ORDER — LIDOCAINE 2% (20 MG/ML) 5 ML SYRINGE
INTRAMUSCULAR | Status: DC | PRN
  Administered 2023-03-11: 40 mg via INTRAVENOUS

## 2023-03-11 MED ORDER — ROCURONIUM BROMIDE 10 MG/ML (PF) SYRINGE
PREFILLED_SYRINGE | INTRAVENOUS | Status: DC | PRN
  Administered 2023-03-11: 50 mg via INTRAVENOUS

## 2023-03-11 MED ORDER — AMISULPRIDE (ANTIEMETIC) 5 MG/2ML IV SOLN: 10.0000 mg | Freq: Once | INTRAVENOUS | Status: DC | PRN

## 2023-03-11 MED ORDER — MIDAZOLAM HCL 2 MG/2ML IJ SOLN
INTRAMUSCULAR | Status: DC | PRN
  Administered 2023-03-11: 2 mg via INTRAVENOUS

## 2023-03-11 MED ORDER — DEXAMETHASONE SODIUM PHOSPHATE 10 MG/ML IJ SOLN
INTRAMUSCULAR | Status: DC | PRN
  Administered 2023-03-11: 10 mg via INTRAVENOUS

## 2023-03-11 MED ORDER — ONDANSETRON HCL 4 MG/2ML IJ SOLN
INTRAMUSCULAR | Status: DC | PRN
  Administered 2023-03-11: 4 mg via INTRAVENOUS

## 2023-03-11 MED ORDER — FENTANYL CITRATE (PF) 250 MCG/5ML IJ SOLN
INTRAMUSCULAR | Status: AC
  Filled 2023-03-11: qty 5

## 2023-03-11 MED ORDER — PHENYLEPHRINE HCL-NACL 20-0.9 MG/250ML-% IV SOLN
INTRAVENOUS | Status: DC | PRN
  Administered 2023-03-11: 30 ug/min via INTRAVENOUS

## 2023-03-11 MED ORDER — PHENYLEPHRINE 80 MCG/ML (10ML) SYRINGE FOR IV PUSH (FOR BLOOD PRESSURE SUPPORT)
PREFILLED_SYRINGE | INTRAVENOUS | Status: DC | PRN
  Administered 2023-03-11: 80 ug via INTRAVENOUS

## 2023-03-11 MED ORDER — MIDAZOLAM HCL 2 MG/2ML IJ SOLN
INTRAMUSCULAR | Status: AC
  Filled 2023-03-11: qty 2

## 2023-03-11 MED ORDER — ACETAMINOPHEN 325 MG PO TABS: 325.0000 mg | ORAL_TABLET | ORAL | Status: DC | PRN

## 2023-03-11 MED ORDER — LIDOCAINE 2% (20 MG/ML) 5 ML SYRINGE
INTRAMUSCULAR | Status: AC
  Filled 2023-03-11: qty 5

## 2023-03-11 SURGICAL SUPPLY — 53 items
ADH SKN CLS APL DERMABOND .7 (GAUZE/BANDAGES/DRESSINGS) ×1
APPLIER CLIP 9.375 SM OPEN (CLIP)
APR CLP SM 9.3 20 MLT OPN (CLIP)
ATTRACTOMAT 16X20 MAGNETIC DRP (DRAPES) IMPLANT
BAG COUNTER SPONGE SURGICOUNT (BAG) ×2 IMPLANT
BAG SPNG CNTER NS LX DISP (BAG)
BLADE SURG 15 STRL LF DISP TIS (BLADE) IMPLANT
BLADE SURG 15 STRL SS (BLADE)
CANISTER SUCT 3000ML PPV (MISCELLANEOUS) ×2 IMPLANT
CLEANER TIP ELECTROSURG 2X2 (MISCELLANEOUS) ×2 IMPLANT
CLIP APPLIE 9.375 SM OPEN (CLIP) IMPLANT
CNTNR URN SCR LID CUP LEK RST (MISCELLANEOUS) ×2 IMPLANT
CONT SPEC 4OZ STRL OR WHT (MISCELLANEOUS)
CORD BIPOLAR FORCEPS 12FT (ELECTRODE) IMPLANT
COVER SURGICAL LIGHT HANDLE (MISCELLANEOUS) ×2 IMPLANT
DERMABOND ADVANCED .7 DNX12 (GAUZE/BANDAGES/DRESSINGS) IMPLANT
DRAIN PENROSE 12X.25 LTX STRL (MISCELLANEOUS) IMPLANT
DRAPE HALF SHEET 40X57 (DRAPES) IMPLANT
ELECT COATED BLADE 2.86 ST (ELECTRODE) ×2 IMPLANT
ELECT PAIRED SUBDERMAL (MISCELLANEOUS)
ELECT REM PT RETURN 9FT ADLT (ELECTROSURGICAL) ×1
ELECTRODE PAIRED SUBDERMAL (MISCELLANEOUS) IMPLANT
ELECTRODE REM PT RTRN 9FT ADLT (ELECTROSURGICAL) ×2 IMPLANT
EVACUATOR SILICONE 100CC (DRAIN) ×2 IMPLANT
GAUZE 4X4 16PLY ~~LOC~~+RFID DBL (SPONGE) ×2 IMPLANT
GAUZE SPONGE 4X4 12PLY STRL (GAUZE/BANDAGES/DRESSINGS) IMPLANT
GLOVE ECLIPSE 7.5 STRL STRAW (GLOVE) ×2 IMPLANT
GOWN STRL REUS W/ TWL LRG LVL3 (GOWN DISPOSABLE) ×6 IMPLANT
GOWN STRL REUS W/TWL LRG LVL3 (GOWN DISPOSABLE) ×4
KIT BASIN OR (CUSTOM PROCEDURE TRAY) ×2 IMPLANT
KIT TURNOVER KIT B (KITS) ×2 IMPLANT
NDL PRECISIONGLIDE 27X1.5 (NEEDLE) IMPLANT
NEEDLE PRECISIONGLIDE 27X1.5 (NEEDLE) IMPLANT
NS IRRIG 1000ML POUR BTL (IV SOLUTION) ×2 IMPLANT
PAD ARMBOARD 7.5X6 YLW CONV (MISCELLANEOUS) ×4 IMPLANT
PENCIL FOOT CONTROL (ELECTRODE) ×2 IMPLANT
PROBE NERVBE PRASS .33 (MISCELLANEOUS) IMPLANT
SPECIMEN JAR MEDIUM (MISCELLANEOUS) ×2 IMPLANT
SPONGE INTESTINAL PEANUT (DISPOSABLE) IMPLANT
STAPLER VISISTAT 35W (STAPLE) ×2 IMPLANT
SUT CHROMIC 3 0 SH 27 (SUTURE) IMPLANT
SUT ETHILON 3 0 PS 1 (SUTURE) IMPLANT
SUT MNCRL AB 3-0 PS2 18 (SUTURE) IMPLANT
SUT MNCRL AB 4-0 PS2 18 (SUTURE) ×4 IMPLANT
SUT NYLON ETHILON 5-0 P-3 1X18 (SUTURE) IMPLANT
SUT SILK 3 0 SH 30 (SUTURE) IMPLANT
SUT SILK 4 0 REEL (SUTURE) ×2 IMPLANT
TOWEL GREEN STERILE FF (TOWEL DISPOSABLE) ×2 IMPLANT
TRAY ENT MC OR (CUSTOM PROCEDURE TRAY) ×2 IMPLANT
TUBE ENDOTRAC EMG 7X10.2 (MISCELLANEOUS) IMPLANT
TUBE ENDOTRAC EMG 8X11.3 (MISCELLANEOUS) IMPLANT
TUBE ENDOTRACH EMG 6MMTUBE EN (MISCELLANEOUS) IMPLANT
WATER STERILE IRR 1000ML POUR (IV SOLUTION) ×2 IMPLANT

## 2023-03-11 NOTE — Discharge Instructions (Signed)
Keep dressing on.  Be careful if you change the dressing to not pull the drain out.  Keep the surgical site clean and dry.  Resume regular diet as tolerated.

## 2023-03-11 NOTE — Interval H&P Note (Signed)
History and Physical Interval Note:  03/11/2023 9:35 AM  Stephanie Galvan  has presented today for surgery, with the diagnosis of Thyroglossal duct cyst.  The various methods of treatment have been discussed with the patient and family. After consideration of risks, benefits and other options for treatment, the patient has consented to  Procedure(s): EXCISION OF THYROGLOSSAL DUCT CYST (N/A) as a surgical intervention.  The patient's history has been reviewed, patient examined, no change in status, stable for surgery.  I have reviewed the patient's chart and labs.  Questions were answered to the patient's satisfaction.     Serena Colonel

## 2023-03-11 NOTE — Anesthesia Postprocedure Evaluation (Signed)
Anesthesia Post Note  Patient: Stephanie Galvan  Procedure(s) Performed: EXCISION OF THYROGLOSSAL DUCT CYST (Neck)     Patient location during evaluation: PACU Anesthesia Type: General Level of consciousness: awake and alert Pain management: pain level controlled Vital Signs Assessment: post-procedure vital signs reviewed and stable Respiratory status: spontaneous breathing, nonlabored ventilation, respiratory function stable and patient connected to nasal cannula oxygen Cardiovascular status: blood pressure returned to baseline and stable Postop Assessment: no apparent nausea or vomiting Anesthetic complications: no  No notable events documented.  Last Vitals:  Vitals:   03/11/23 1230 03/11/23 1245  BP: (!) 148/74 (!) 156/82  Pulse: (!) 55 (!) 56  Resp: 12 16  Temp: 36.6 C 36.6 C  SpO2: 96% 100%    Last Pain:  Vitals:   03/11/23 1230  PainSc: 0-No pain                 Shelton Silvas

## 2023-03-11 NOTE — Transfer of Care (Signed)
Immediate Anesthesia Transfer of Care Note  Patient: Stephanie Galvan  Procedure(s) Performed: EXCISION OF THYROGLOSSAL DUCT CYST (Neck)  Patient Location: PACU  Anesthesia Type:General  Level of Consciousness: drowsy and patient cooperative  Airway & Oxygen Therapy: Patient Spontanous Breathing  Post-op Assessment: Report given to RN and Post -op Vital signs reviewed and stable  Post vital signs: Reviewed and stable  Last Vitals:  Vitals Value Taken Time  BP 146/79 03/11/23 1203  Temp    Pulse 59 03/11/23 1204  Resp    SpO2 97 % 03/11/23 1204  Vitals shown include unvalidated device data.  Last Pain:  Vitals:   03/11/23 0747  PainSc: 0-No pain         Complications: No notable events documented.

## 2023-03-11 NOTE — Anesthesia Procedure Notes (Signed)
Procedure Name: Intubation Date/Time: 03/11/2023 10:51 AM  Performed by: Gus Puma, CRNAPre-anesthesia Checklist: Patient identified, Emergency Drugs available, Suction available and Patient being monitored Patient Re-evaluated:Patient Re-evaluated prior to induction Oxygen Delivery Method: Circle System Utilized Preoxygenation: Pre-oxygenation with 100% oxygen Induction Type: IV induction Ventilation: Mask ventilation without difficulty Laryngoscope Size: Glidescope and 3 Grade View: Grade I Tube type: Oral Tube size: 7.0 mm Number of attempts: 1 Airway Equipment and Method: Rigid stylet and Video-laryngoscopy Placement Confirmation: ETT inserted through vocal cords under direct vision, positive ETCO2 and breath sounds checked- equal and bilateral Secured at: 22 cm Tube secured with: Tape Dental Injury: Teeth and Oropharynx as per pre-operative assessment  Comments: Performed by Justice Britain

## 2023-03-11 NOTE — Op Note (Signed)
OPERATIVE REPORT  DATE OF SURGERY: 03/11/2023  PATIENT:  Stephanie Galvan,  67 y.o. female  PRE-OPERATIVE DIAGNOSIS:  Thyroglossal duct cyst  POST-OPERATIVE DIAGNOSIS:  Thyroglossal duct cyst  PROCEDURE:  Procedure(s): EXCISION OF THYROGLOSSAL DUCT CYST, [place] procedure  SURGEON:  Susy Frizzle, MD  ASSISTANTS: RNFA  ANESTHESIA:   General   EBL: 20 ml  DRAINS: Quarter-inch Penrose  LOCAL MEDICATIONS USED:  None  SPECIMEN: Thyroglossal duct cyst with medial portion of hyoid bone and tongue base tract.  Okay just little bit  COUNTS:  Correct  PROCEDURE DETAILS: The patient was taken to the operating room and placed on the operating table in the supine position. Following induction of general endotracheal appreciate using a beer for 4 hours anesthesia, the neck was prepped and draped in standard fashion.  Transverse incision was outlined with a marking pen just below the cystic mass at about the level of the superior portion of the thyroid cartilage.  Electrocautery was used to incise the skin and subcutaneous tissue.  Dissected between the strap muscles revealed the cystic mass.  This was dissected free in all directions down to the thyrohyoid membrane and then following up to the hyoid bone.  The cyst contained a combination of thick mucoid and watery material.  The midportion of the hyoid was transected using bone cutting rongeurs.  This portion of the specimen was removed and then inspection of the suprahyoid tissues revealed a persistent tract up towards the base of tongue.  This was then separately dissected out until there is no further tract identified.  The stump was ligated with a single 3-0 silk suture ligature.  The wound was irrigated with saline.  Bipolar cautery was used for hemostasis.  The drain was left within the bed.  The deep layer of closure was accomplished using interrupted 3-0 Monocryl.  Subcuticular closure was performed in a similar fashion.  Dermabond was used  on the skin.  A dressing was applied.  Patient was awakened extubated and transferred to recovery in stable condition.    PATIENT DISPOSITION:  To PACU, stable

## 2023-03-12 ENCOUNTER — Encounter (HOSPITAL_COMMUNITY): Payer: Self-pay | Admitting: Otolaryngology

## 2023-03-12 LAB — SURGICAL PATHOLOGY

## 2023-03-19 ENCOUNTER — Inpatient Hospital Stay: Payer: Medicare HMO | Attending: Hematology | Admitting: Hematology

## 2023-03-19 VITALS — BP 153/90 | HR 71 | Temp 96.7°F | Resp 16 | Wt 157.3 lb

## 2023-03-19 DIAGNOSIS — Z818 Family history of other mental and behavioral disorders: Secondary | ICD-10-CM | POA: Diagnosis not present

## 2023-03-19 DIAGNOSIS — Z79899 Other long term (current) drug therapy: Secondary | ICD-10-CM | POA: Diagnosis not present

## 2023-03-19 DIAGNOSIS — C9000 Multiple myeloma not having achieved remission: Secondary | ICD-10-CM | POA: Diagnosis not present

## 2023-03-19 DIAGNOSIS — Z853 Personal history of malignant neoplasm of breast: Secondary | ICD-10-CM | POA: Diagnosis not present

## 2023-03-19 DIAGNOSIS — Z8249 Family history of ischemic heart disease and other diseases of the circulatory system: Secondary | ICD-10-CM | POA: Diagnosis not present

## 2023-03-19 DIAGNOSIS — Z809 Family history of malignant neoplasm, unspecified: Secondary | ICD-10-CM | POA: Insufficient documentation

## 2023-03-19 DIAGNOSIS — Z7961 Long term (current) use of immunomodulator: Secondary | ICD-10-CM | POA: Diagnosis not present

## 2023-03-19 DIAGNOSIS — E876 Hypokalemia: Secondary | ICD-10-CM | POA: Diagnosis not present

## 2023-03-19 DIAGNOSIS — Z90710 Acquired absence of both cervix and uterus: Secondary | ICD-10-CM | POA: Insufficient documentation

## 2023-03-19 DIAGNOSIS — Z800 Family history of malignant neoplasm of digestive organs: Secondary | ICD-10-CM | POA: Diagnosis not present

## 2023-03-19 DIAGNOSIS — D72819 Decreased white blood cell count, unspecified: Secondary | ICD-10-CM | POA: Diagnosis not present

## 2023-03-19 DIAGNOSIS — R197 Diarrhea, unspecified: Secondary | ICD-10-CM | POA: Diagnosis not present

## 2023-03-19 DIAGNOSIS — Z833 Family history of diabetes mellitus: Secondary | ICD-10-CM | POA: Diagnosis not present

## 2023-03-19 DIAGNOSIS — Z8379 Family history of other diseases of the digestive system: Secondary | ICD-10-CM | POA: Insufficient documentation

## 2023-03-19 MED ORDER — CHOLESTYRAMINE 4 G PO PACK: 4.0000 g | PACK | Freq: Three times a day (TID) | ORAL | 12 refills | Status: DC

## 2023-03-19 NOTE — Patient Instructions (Signed)
[facility]  Discharge Instructions  You were seen and examined today by Dr. Ellin Saba.  Dr. Ellin Saba discussed your most recent lab work and CT scan which revealed that everything looks good and stable.  Dr. Ellin Saba sent in [place] for you to take to help with the diarrhea. If it is not helping Dr. Ellin Saba will decrease the dose of the Revlimid.  Follow-up as scheduled in 4 weeks.   Thank you for choosing [facility] to provide your oncology and hematology care.   To afford each patient quality time with our provider, please arrive at least 15 minutes before your scheduled appointment time. You may need to reschedule your appointment if you arrive late (10 or more minutes). Arriving late affects you and other patients whose appointments are after yours.  Also, if you miss three or more appointments without notifying the office, you may be dismissed from the clinic at the provider's discretion.    Again, thank you for choosing [facility].  Our hope is that these requests will decrease the amount of time that you wait before being seen by our physicians.   If you have a lab appointment with the Cancer Center - please note that after April 8th, all labs will be drawn in the cancer center.  You do not have to check in or register with the main entrance as you have in the past but will complete your check-in at the cancer center.            _____________________________________________________________  Should you have questions after your visit to [facility], please contact our office at [phone] and follow the prompts.  Our office hours are 8:00 a.m. to 4:30 p.m. Monday - Thursday and 8:00 a.m. to 2:30 p.m. Friday.  Please note that voicemails left after 4:00 p.m. may not be returned until the following business day.  We are closed weekends and all major holidays.  You do have access to a nurse 24-7,  just call the main number to the clinic [phone] and do not press any options, hold on the line and a nurse will answer the phone.    For prescription refill requests, have your pharmacy contact our office and allow 72 hours.    Masks are no longer required in the cancer centers. If you would like for your care team to wear a mask while they are taking care of you, please let them know. You may have one support person who is at least 68 years old accompany you for your appointments.

## 2023-03-19 NOTE — Progress Notes (Signed)
[facility] [address] [place] [address]    Clinic Day:  03/19/2023  Referring physician: Gareth Morgan, MD  Patient Care Team: Gareth Morgan, MD as PCP - General (Family Medicine) Stephanie Massed, MD as Consulting Physician (Hematology) Eddie Candle, MD as Referring Physician (Internal Medicine)   ASSESSMENT & PLAN:   Assessment: 1. 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  Revlimid started on 10/06/2019 due to delay. -Velcade held since 12/15/2019 due to gastric symptoms, which have resolved completely. -She was evaluated by Dr. Donnie Coffin at [facility].  Recommended to have immediate bone marrow transplant. -She has compression fractures of T8, T10 and T12.  Also L1 and L3 compression fractures. -Revlimid and dexamethasone continued until 03/22/2020. 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 -Revlimid 10 mg daily started around first week of December 2021. - Revlimid dose decreased to 10 mg 3 weeks on 1 week off on 01/04/2021 due to worsening back pain.   2.  Upper/mid back pain: -MRI on 07/13/2020 shows T7, T8, T12, L1 and L2 compression/superior endplate fractures.  Height loss is greatest and moderate at T7 and T8.  No evidence of bone lesions. - She was previously evaluated by IR for vertebroplasty which was not recommended.   Plan:  1.  IgA lambda plasma cell myeloma: - She is taking Revlimid 10 mg 3 weeks on/1 week off. - Reviewed myeloma labs from 02/26/2023: M spike is negative.  Kappa light chains 41, lambda light chains 30, ratio 1.34.  Immunofixation was normal. - CBC shows mild leukopenia with mild neutropenia.  Recently had thyroglossal cyst removed. - She reports having diarrhea 3-4 times per day on and off for the last 2 months. - Recommend starting Questran 4 g 3 times daily with meals. - Reevaluate in 1 month.  If no  improvement in diarrhea, consider dose reduction of Revlimid 5 mg 3 weeks on/1 week off.  2.  Bone protection: - She received last dose of Zometa on 12/12/2022.  We will restart Zometa if any recurrence of myeloma.  3.  Hypokalemia/hypomagnesemia: - Continue potassium 20 twice daily.  Continue magnesium every other day.   Orders Placed This Encounter  Procedures   Basic metabolic panel    Standing Status:   Future    Standing Expiration Date:   03/18/2024      Mikeal Hawthorne R Teague,acting as a scribe for Stephanie Massed, MD.,have documented all relevant documentation on the behalf of Stephanie Massed, MD,as directed by  Stephanie Massed, MD while in the presence of Stephanie Massed, MD.   I, Stephanie Massed MD, have reviewed the above documentation for accuracy and completeness, and I agree with the above.   Stephanie Massed, MD   7/9/20245:03 PM  CHIEF COMPLAINT:   Diagnosis: Multiple myeloma   Cancer Staging  Multiple myeloma (HCC) Staging form: Plasma Cell Myeloma and Plasma Cell Disorders, AJCC 8th Edition - Clinical stage from 08/22/2019: RISS Stage II (Beta-2-microglobulin (mg/L): 3.8, Albumin (g/dL): 3.2, ISS: Stage II, High-risk cytogenetics: Absent, LDH: Normal) - Signed by Stephanie Massed, MD on 08/22/2019    Prior Therapy: 1. RVD x 6 cycles from 08/19/2019 to 12/15/2019. 2. 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  Current Therapy: Revlimid 10 mg daily; Zometa every 12 weeks    HISTORY OF PRESENT ILLNESS:   Oncology History  Multiple myeloma (HCC)  08/22/2019 Initial Diagnosis   Multiple myeloma (HCC)   08/22/2019 Cancer  Staging   Staging form: Plasma Cell Myeloma and Plasma Cell Disorders, AJCC 8th Edition - Clinical stage from 08/22/2019: RISS Stage II (Beta-2-microglobulin (mg/L): 3.8, Albumin (g/dL): 3.2, ISS: Stage II, High-risk cytogenetics: Absent, LDH: Normal) - Signed by Stephanie Massed, MD on 08/22/2019      INTERVAL HISTORY:    Dioselina is a 67 y.o. female presenting to clinic today for follow up of multiple myeloma. She was last seen by me on 12/12/22.  Since our last visit, patient underwent an NM Whole Body Bone Scan on 5/23 that showed no suspicious abnormal uptake. Specifically, there is no correlate for the recent x-ray finding in the ninth anterior rib on today's study.  Today, she states that she is doing well overall. Her appetite level is at 100%. Her energy level is at 80%.  PAST MEDICAL HISTORY:   Past Medical History: Past Medical History:  Diagnosis Date   Breast cancer (HCC) 2006   left breast   Breast mass, right    Compression fracture of cervical spine (HCC) 06/18/2019   recent Chest xray stating had fx.   High cholesterol    History of bladder infections    Hypertension    Multiple myeloma (HCC) 2021   bone marrow transplant 2021   Rash 04/2012    Surgical History: Past Surgical History:  Procedure Laterality Date   ABDOMINAL HYSTERECTOMY     just uterus removed   BREAST BIOPSY     BREAST LUMPECTOMY WITH RADIOACTIVE SEED LOCALIZATION Right 07/24/2018   Procedure: RIGHT BREAST LUMPECTOMY WITH RADIOACTIVE SEED LOCALIZATION;  Surgeon: Abigail Miyamoto, MD;  Location: [facility];  Service: General;  Laterality: Right;   IR RADIOLOGIST EVAL & MGMT  07/28/2020   MASTECTOMY  5/06   left   PARTIAL HYSTERECTOMY     PORT-A-CATH REMOVAL     PORTACATH PLACEMENT     THYROGLOSSAL DUCT CYST N/A 03/11/2023   Procedure: EXCISION OF THYROGLOSSAL DUCT CYST;  Surgeon: Serena Colonel, MD;  Location: [facility] OR;  Service: ENT;  Laterality: N/A;    Social History: Social History   Socioeconomic History   Marital status: Married    Spouse name: Not on file   Number of children: 2   Years of education: Not on file   Highest education level: Not on file  Occupational History    Employer: [employer]  Tobacco Use   Smoking status: Never   Smokeless tobacco: Never  Vaping Use    Vaping Use: Never used  Substance and Sexual Activity   Alcohol use: No   Drug use: No   Sexual activity: Yes    Birth control/protection: Surgical  Other Topics Concern   Not on file  Social History Narrative   Not on file   Social Determinants of Health   Financial Resource Strain: Low Risk  (07/20/2020)   Overall Financial Resource Strain (CARDIA)    Difficulty of Paying Living Expenses: Not hard at all  Food Insecurity: No Food Insecurity (07/20/2020)   Hunger Vital Sign    Worried About Radiation protection practitioner of Food in the Last Year: Never true    Ran Out of Food in the Last Year: Never true  Transportation Needs: No Transportation Needs (07/20/2020)   PRAPARE - Administrator, Civil Service (Medical): No    Lack of Transportation (Non-Medical): No  Physical Activity: Inactive (07/20/2020)   Exercise Vital Sign    Days of Exercise per Week: 0 days    Minutes of  Exercise per Session: 0 min  Stress: No Stress Concern Present (07/20/2020)   [employer] of Occupational Health - Occupational Stress Questionnaire    Feeling of Stress : Not at all  Social Connections: Moderately Integrated (07/20/2020)   Social Connection and Isolation Panel [NHANES]    Frequency of Communication with Friends and Family: More than three times a week    Frequency of Social Gatherings with Friends and Family: Never    Attends Religious Services: More than 4 times per year    Active Member of [employer] or Organizations: No    Attends Banker Meetings: Never    Marital Status: Married  Catering manager Violence: Not At Risk (07/20/2020)   Humiliation, Afraid, Rape, and Kick questionnaire    Fear of Current or Ex-Partner: No    Emotionally Abused: No    Physically Abused: No    Sexually Abused: No    Family History: Family History  Problem Relation Age of Onset   Dementia Mother    Stomach cancer Father    Cancer Brother    Cancer Brother    Diabetes Brother    Colitis  Daughter    Hypertension Daughter    Hypertension Daughter     Current Medications:  Current Outpatient Medications:    aspirin EC 81 MG tablet, Take 81 mg by mouth daily. Swallow whole., Disp: , Rfl:    atenolol (TENORMIN) 25 MG tablet, Take 25 mg by mouth daily., Disp: , Rfl:    calcium carbonate (TUMS - DOSED IN MG ELEMENTAL CALCIUM) 500 MG chewable tablet, Chew 1 tablet by mouth 2 (two) times daily., Disp: , Rfl:    cephALEXin (KEFLEX) 500 MG capsule, Take 1 capsule (500 mg total) by mouth 3 (three) times daily., Disp: 15 capsule, Rfl: 0   cholestyramine (QUESTRAN) 4 g packet, Take 1 packet (4 g total) by mouth 3 (three) times daily with meals., Disp: 90 each, Rfl: 12   hydrochlorothiazide (HYDRODIURIL) 25 MG tablet, Take 25 mg by mouth daily., Disp: , Rfl:    HYDROcodone-acetaminophen (NORCO) 7.5-325 MG tablet, Take 1 tablet by mouth every 6 (six) hours as needed for moderate pain., Disp: 20 tablet, Rfl: 0   lenalidomide (REVLIMID) 10 MG capsule, TAKE 1 CAPSULE BY MOUTH EVERY DAY FOR 3 WEEKS ON THEN 1 WEEK OFF FOR A 28 DAY SUPPLY, Disp: 21 capsule, Rfl: 0   ondansetron (ZOFRAN-ODT) 4 MG disintegrating tablet, Take 1 tablet (4 mg total) by mouth every 8 (eight) hours as needed for nausea or vomiting., Disp: 20 tablet, Rfl: 0   potassium chloride (KLOR-CON) 10 MEQ tablet, TAKE 2 TABLETS TWICE DAILY, Disp: 360 tablet, Rfl: 3   Allergies: No Known Allergies  REVIEW OF SYSTEMS:   Review of Systems  Gastrointestinal:  Positive for diarrhea.  Neurological:  Positive for numbness.  All other systems reviewed and are negative.    VITALS:   Blood pressure (!) 153/90, pulse 71, temperature (!) 96.7 F (35.9 C), resp. rate 16, weight 157 lb 4.8 oz (71.4 kg), SpO2 100 %.  Wt Readings from Last 3 Encounters:  03/19/23 157 lb 4.8 oz (71.4 kg)  03/11/23 154 lb (69.9 kg)  01/17/23 161 lb 12.8 oz (73.4 kg)    Body mass index is 26.18 kg/m.  Performance status (ECOG): 1 - Symptomatic but  completely ambulatory  PHYSICAL EXAM:   Physical Exam Vitals reviewed.  Constitutional:      Appearance: Normal appearance.  Neurological:     General: No  focal deficit present.     Mental Status: She is alert.  Psychiatric:        Mood and Affect: Mood normal.        Behavior: Behavior normal.     LABS:      Latest Ref Rng & Units 02/26/2023    1:19 PM 12/05/2022    1:22 PM 09/12/2022    2:23 PM  CBC  WBC 4.0 - 10.5 K/uL 2.7  3.2  3.2   Hemoglobin 12.0 - 15.0 g/dL 16.1  09.6  04.5   Hematocrit 36.0 - 46.0 % 34.5  36.3  36.1   Platelets 150 - 400 K/uL 205  190  184       Latest Ref Rng & Units 02/26/2023    1:19 PM 12/12/2022    1:55 PM 12/05/2022    1:22 PM  CMP  Glucose 70 - 99 mg/dL 89   67   BUN 8 - 23 mg/dL 12   14   Creatinine 4.09 - 1.00 mg/dL 8.11   9.14   Sodium 782 - 145 mmol/L 136   133   Potassium 3.5 - 5.1 mmol/L 2.7   3.3   Chloride 98 - 111 mmol/L 99   99   CO2 22 - 32 mmol/L 28   26   Calcium 8.9 - 10.3 mg/dL 8.0  9.4  95.6   Total Protein 6.5 - 8.1 g/dL 7.1   7.1   Total Bilirubin 0.3 - 1.2 mg/dL 0.6   0.6   Alkaline Phos 38 - 126 U/L 93   83   AST 15 - 41 U/L 24   23   ALT 0 - 44 U/L 21   24      No results found for: "CEA1", "CEA" / No results found for: "CEA1", "CEA" No results found for: "PSA1" No results found for: "CAN199" No results found for: "CAN125"  Lab Results  Component Value Date   TOTALPROTELP 6.8 02/26/2023   TOTALPROTELP 6.4 02/26/2023   ALBUMINELP 3.4 02/26/2023   A1GS 0.3 02/26/2023   A2GS 0.8 02/26/2023   BETS 1.1 02/26/2023   GAMS 1.2 02/26/2023   MSPIKE Not Observed 02/26/2023   SPEI Comment 02/26/2023   No results found for: "TIBC", "FERRITIN", "IRONPCTSAT" Lab Results  Component Value Date   LDH 98 02/26/2023   LDH 102 12/05/2022   LDH 118 06/20/2022     STUDIES:   No results found.

## 2023-03-20 ENCOUNTER — Other Ambulatory Visit: Payer: Self-pay | Admitting: Hematology

## 2023-03-20 DIAGNOSIS — C9001 Multiple myeloma in remission: Secondary | ICD-10-CM

## 2023-03-21 ENCOUNTER — Other Ambulatory Visit: Payer: Self-pay

## 2023-03-25 ENCOUNTER — Other Ambulatory Visit: Payer: Self-pay

## 2023-03-26 ENCOUNTER — Other Ambulatory Visit: Payer: Self-pay

## 2023-03-26 ENCOUNTER — Other Ambulatory Visit: Payer: Self-pay | Admitting: *Deleted

## 2023-03-26 DIAGNOSIS — C9001 Multiple myeloma in remission: Secondary | ICD-10-CM

## 2023-03-29 ENCOUNTER — Encounter (HOSPITAL_COMMUNITY): Payer: Self-pay | Admitting: Hematology

## 2023-04-04 ENCOUNTER — Other Ambulatory Visit: Payer: Self-pay

## 2023-04-04 MED ORDER — POTASSIUM CHLORIDE 10 % PO SOLN: 20.0000 meq | Freq: Two times a day (BID) | ORAL | 3 refills | Status: DC

## 2023-04-10 ENCOUNTER — Inpatient Hospital Stay: Payer: Medicare HMO

## 2023-04-10 DIAGNOSIS — C9000 Multiple myeloma not having achieved remission: Secondary | ICD-10-CM

## 2023-04-10 LAB — CBC WITH DIFFERENTIAL/PLATELET
Abs Immature Granulocytes: 0 10*3/uL (ref 0.00–0.07)
Basophils Absolute: 0 10*3/uL (ref 0.0–0.1)
Basophils Relative: 1 %
Eosinophils Absolute: 0.1 10*3/uL (ref 0.0–0.5)
Eosinophils Relative: 4 %
HCT: 35.4 % — ABNORMAL LOW (ref 36.0–46.0)
Hemoglobin: 11.4 g/dL — ABNORMAL LOW (ref 12.0–15.0)
Lymphocytes Relative: 46 %
Lymphs Abs: 1.3 10*3/uL (ref 0.7–4.0)
MCH: 31.1 pg (ref 26.0–34.0)
MCHC: 32.2 g/dL (ref 30.0–36.0)
MCV: 96.7 fL (ref 80.0–100.0)
Monocytes Absolute: 0.3 10*3/uL (ref 0.1–1.0)
Monocytes Relative: 12 %
Neutro Abs: 1.1 10*3/uL — ABNORMAL LOW (ref 1.7–7.7)
Neutrophils Relative %: 37 %
Platelets: 181 10*3/uL (ref 150–400)
RBC: 3.66 MIL/uL — ABNORMAL LOW (ref 3.87–5.11)
RDW: 14.5 % (ref 11.5–15.5)
WBC: 2.9 10*3/uL — ABNORMAL LOW (ref 4.0–10.5)
nRBC: 0 % (ref 0.0–0.2)

## 2023-04-10 LAB — COMPREHENSIVE METABOLIC PANEL
ALT: 31 U/L (ref 0–44)
AST: 21 U/L (ref 15–41)
Albumin: 3.4 g/dL — ABNORMAL LOW (ref 3.5–5.0)
Alkaline Phosphatase: 111 U/L (ref 38–126)
Anion gap: 8 (ref 5–15)
BUN: 16 mg/dL (ref 8–23)
CO2: 27 mmol/L (ref 22–32)
Calcium: 8.8 mg/dL — ABNORMAL LOW (ref 8.9–10.3)
Chloride: 101 mmol/L (ref 98–111)
Creatinine, Ser: 0.96 mg/dL (ref 0.44–1.00)
GFR, Estimated: >60 mL/min (ref >60)
Glucose, Bld: 85 mg/dL (ref 70–99)
Potassium: 3.4 mmol/L — ABNORMAL LOW (ref 3.5–5.1)
Sodium: 136 mmol/L (ref 135–145)
Total Bilirubin: 0.5 mg/dL (ref 0.3–1.2)
Total Protein: 7 g/dL (ref 6.5–8.1)

## 2023-04-10 LAB — LACTATE DEHYDROGENASE: LDH: 94 U/L — ABNORMAL LOW (ref 98–192)

## 2023-04-10 LAB — MAGNESIUM: Magnesium: 1.6 mg/dL — ABNORMAL LOW (ref 1.7–2.4)

## 2023-04-15 NOTE — Progress Notes (Signed)
[facility] [address] [place] [address]    Clinic Day:  04/16/2023  Referring physician: Gareth Morgan, MD  Patient Care Team: Stephanie Morgan, MD as PCP - General (Family Medicine) Stephanie Massed, MD as Consulting Physician (Hematology) Stephanie Candle, MD as Referring Physician (Internal Medicine)   ASSESSMENT & PLAN:   Assessment: 1. 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  Revlimid started on 10/06/2019 due to delay. -Velcade held since 12/15/2019 due to gastric symptoms, which have resolved completely. -She was evaluated by Dr. Donnie [facility] at [facility].  Recommended to have immediate bone marrow transplant. -She has compression fractures of T8, T10 and T12.  Also L1 and L3 compression fractures. -Revlimid and dexamethasone continued until 03/22/2020. 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 -Revlimid 10 mg daily started around first week of December 2021. - Revlimid dose decreased to 10 mg 3 weeks on 1 week off on 01/04/2021 due to worsening back pain.   2.  Upper/mid back pain: -MRI on 07/13/2020 shows T7, T8, T12, L1 and L2 compression/superior endplate fractures.  Height loss is greatest and moderate at T7 and T8.  No evidence of bone lesions. - She was previously evaluated by IR for vertebroplasty which was not recommended.   Plan:  1.  IgA lambda plasma cell myeloma: - She is taking Revlimid 10 mg 3 weeks on/1 week off. - Reviewed myeloma labs from 02/26/2023: M spike is negative.  Kappa light chains 41, lambda light chains 30, ratio 1.34.  Immunofixation was normal. - CBC shows mild leukopenia with mild neutropenia.  Recently had thyroglossal cyst removed. - She reports having diarrhea 3-4 times per day on and off for the last 2 months. - Recommend starting Questran 4 g 3 times daily with meals. - Reevaluate in 1 month.  If no  improvement in diarrhea, consider dose reduction of Revlimid 5 mg 3 weeks on/1 week off.  2.  Bone protection: - She received last dose of Zometa on 12/12/2022.  We will restart Zometa if any recurrence of myeloma.  3.  Hypokalemia/hypomagnesemia: - Continue potassium 20 twice daily.  Continue magnesium every other day.   No orders of the defined types were placed in this encounter.     [facility],acting as a Neurosurgeon for Stephanie Massed, MD.,have documented all relevant documentation on the behalf of Stephanie Massed, MD,as directed by  Stephanie Massed, MD while in the presence of Stephanie Massed, MD.  ***   [place]   8/5/20248:54 PM  CHIEF COMPLAINT:   Diagnosis: Multiple myeloma   Cancer Staging  Multiple myeloma (HCC) Staging form: Plasma Cell Myeloma and Plasma Cell Disorders, AJCC 8th Edition - Clinical stage from 08/22/2019: RISS Stage II (Beta-2-microglobulin (mg/L): 3.8, Albumin (g/dL): 3.2, ISS: Stage II, High-risk cytogenetics: Absent, LDH: Normal) - Signed by Stephanie Massed, MD on 08/22/2019    Prior Therapy: 1. RVD x 6 cycles from 08/19/2019 to 12/15/2019. 2. 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  Current Therapy: Revlimid 10 mg daily; Zometa every 12 weeks    HISTORY OF PRESENT ILLNESS:   Oncology History  Multiple myeloma (HCC)  08/22/2019 Initial Diagnosis   Multiple myeloma (HCC)   08/22/2019 Cancer Staging   Staging form: Plasma Cell Myeloma and Plasma Cell Disorders, AJCC 8th Edition - Clinical stage from 08/22/2019: RISS Stage II (Beta-2-microglobulin (mg/L): 3.8, Albumin (g/dL): 3.2, ISS: Stage II, High-risk cytogenetics: Absent, LDH: Normal) -  Signed by Stephanie Massed, MD on 08/22/2019      INTERVAL HISTORY:   Stephanie [facility] is a 67 y.o. female presenting to clinic today for follow up of multiple myeloma. She was last seen by me on 03/19/23.  Today, she states that she is doing well overall. Her appetite level is at ***%.  Her energy level is at ***%.  PAST MEDICAL HISTORY:   Past Medical History: Past Medical History:  Diagnosis Date   Breast cancer (HCC) 2006   left breast   Breast mass, right    Compression fracture of cervical spine (HCC) 06/18/2019   recent Chest xray stating had fx.   High cholesterol    History of bladder infections    Hypertension    Multiple myeloma (HCC) 2021   bone marrow transplant 2021   Rash 04/2012    Surgical History: Past Surgical History:  Procedure Laterality Date   ABDOMINAL HYSTERECTOMY     just uterus removed   BREAST BIOPSY     BREAST LUMPECTOMY WITH RADIOACTIVE SEED LOCALIZATION Right 07/24/2018   Procedure: RIGHT BREAST LUMPECTOMY WITH RADIOACTIVE SEED LOCALIZATION;  Surgeon: Stephanie Miyamoto, MD;  Location: [facility];  Service: General;  Laterality: Right;   IR RADIOLOGIST EVAL & MGMT  07/28/2020   MASTECTOMY  5/06   left   PARTIAL HYSTERECTOMY     PORT-A-CATH REMOVAL     PORTACATH PLACEMENT     THYROGLOSSAL DUCT CYST N/A 03/11/2023   Procedure: EXCISION OF THYROGLOSSAL DUCT CYST;  Surgeon: Stephanie Colonel, MD;  Location: [facility];  Service: ENT;  Laterality: N/A;    Social History: Social History   Socioeconomic History   Marital status: Married    Spouse name: Not on file   Number of children: 2   Years of education: Not on file   Highest education level: Not on file  Occupational History    Employer: [employer]  Tobacco Use   Smoking status: Never   Smokeless tobacco: Never  Vaping Use   Vaping status: Never Used  Substance and Sexual Activity   Alcohol use: No   Drug use: No   Sexual activity: Yes    Birth control/protection: Surgical  Other Topics Concern   Not on file  Social History Narrative   Not on file   Social Determinants of Health   Financial Resource Strain: Low Risk  (07/20/2020)   Overall Financial Resource Strain (CARDIA)    Difficulty of Paying Living Expenses: Not hard at all  Food Insecurity:  Low Risk  (03/21/2023)   Received from [facility]   Food vital sign    Within the past 12 months, you worried that your food would run out before you got money to buy more: Never true    Within the past 12 months, the food you bought just didn't last and you didn't have money to get more. : Never true  Transportation Needs: Not on file (03/21/2023)  Physical Activity: Inactive (07/20/2020)   Exercise Vital Sign    Days of Exercise per Week: 0 days    Minutes of Exercise per Session: 0 min  Stress: No Stress Concern Present (07/20/2020)   [employer] of Occupational Health - Occupational Stress Questionnaire    Feeling of Stress : Not at all  Social Connections: Moderately Integrated (07/20/2020)   Social Connection and Isolation Panel [NHANES]    Frequency of Communication with Friends and Family: More than three times a week    Frequency of Social  Gatherings with Friends and Family: Never    Attends Religious Services: More than 4 times per year    Active Member of [employer] or Organizations: No    Attends Banker Meetings: Never    Marital Status: Married  Catering manager Violence: Not At Risk (07/20/2020)   Humiliation, Afraid, Rape, and Kick questionnaire    Fear of Current or Ex-Partner: No    Emotionally Abused: No    Physically Abused: No    Sexually Abused: No    Family History: Family History  Problem Relation Age of Onset   Dementia Mother    Stomach cancer Father    Cancer Brother    Cancer Brother    Diabetes Brother    Colitis Daughter    Hypertension Daughter    Hypertension Daughter     Current Medications:  Current Outpatient Medications:    aspirin EC 81 MG tablet, Take 81 mg by mouth daily. Swallow whole., Disp: , Rfl:    atenolol (TENORMIN) 25 MG tablet, Take 25 mg by mouth daily., Disp: , Rfl:    calcium carbonate (TUMS - DOSED IN MG ELEMENTAL CALCIUM) 500 MG chewable tablet, Chew 1 tablet by mouth 2 (two) times daily., Disp: , Rfl:     cephALEXin (KEFLEX) 500 MG capsule, Take 1 capsule (500 mg total) by mouth 3 (three) times daily., Disp: 15 capsule, Rfl: 0   cholestyramine (QUESTRAN) 4 g packet, Take 1 packet (4 g total) by mouth 3 (three) times daily with meals., Disp: 90 each, Rfl: 12   hydrochlorothiazide (HYDRODIURIL) 25 MG tablet, Take 25 mg by mouth daily., Disp: , Rfl:    HYDROcodone-acetaminophen (NORCO) 7.5-325 MG tablet, Take 1 tablet by mouth every 6 (six) hours as needed for moderate pain., Disp: 20 tablet, Rfl: 0   lenalidomide (REVLIMID) 10 MG capsule, TAKE 1 CAPSULE BY MOUTH EVERY DAY FOR 3 WEEKS ON THEN 1 WEEK OFF FOR A 28 DAY SUPPLY, Disp: 21 capsule, Rfl: 0   ondansetron (ZOFRAN-ODT) 4 MG disintegrating tablet, Take 1 tablet (4 mg total) by mouth every 8 (eight) hours as needed for nausea or vomiting., Disp: 20 tablet, Rfl: 0   Potassium Chloride 10 % SOLN, Take 20 mEq by mouth in the morning and at bedtime., Disp: 946 mL, Rfl: 3   Allergies: No Known Allergies  REVIEW OF SYSTEMS:   Review of Systems  Constitutional:  Negative for chills, fatigue and fever.  HENT:   Negative for lump/mass, mouth sores, nosebleeds, sore throat and trouble swallowing.   Eyes:  Negative for eye problems.  Respiratory:  Negative for cough and shortness of breath.   Cardiovascular:  Negative for chest pain, leg swelling and palpitations.  Gastrointestinal:  Negative for abdominal pain, constipation, diarrhea, nausea and vomiting.  Genitourinary:  Negative for bladder incontinence, difficulty urinating, dysuria, frequency, hematuria and nocturia.   Musculoskeletal:  Negative for arthralgias, back pain, flank pain, myalgias and neck pain.  Skin:  Negative for itching and rash.  Neurological:  Negative for dizziness, headaches and numbness.  Hematological:  Does not bruise/bleed easily.  Psychiatric/Behavioral:  Negative for depression, sleep disturbance and suicidal ideas. The patient is not nervous/anxious.   All other  systems reviewed and are negative.    VITALS:   There were no vitals taken for this visit.  Wt Readings from Last 3 Encounters:  03/19/23 157 lb 4.8 oz (71.4 kg)  03/11/23 154 lb (69.9 kg)  01/17/23 161 lb 12.8 oz (73.4 kg)  There is no height or weight on file to calculate BMI.  Performance status (ECOG): 1 - Symptomatic but completely ambulatory  PHYSICAL EXAM:   Physical Exam Vitals and nursing note reviewed. Exam conducted with a chaperone present.  Constitutional:      Appearance: Normal appearance.  Cardiovascular:     Rate and Rhythm: Normal rate and regular rhythm.     Pulses: Normal pulses.     Heart sounds: Normal heart sounds.  Pulmonary:     Effort: Pulmonary effort is normal.     Breath sounds: Normal breath sounds.  Abdominal:     Palpations: Abdomen is soft. There is no hepatomegaly, splenomegaly or mass.     Tenderness: There is no abdominal tenderness.  Musculoskeletal:     Right lower leg: No edema.     Left lower leg: No edema.  Lymphadenopathy:     Cervical: No cervical adenopathy.     Right cervical: No superficial, deep or posterior cervical adenopathy.    Left cervical: No superficial, deep or posterior cervical adenopathy.     Upper Body:     Right upper body: No supraclavicular or axillary adenopathy.     Left upper body: No supraclavicular or axillary adenopathy.  Neurological:     General: No focal deficit present.     Mental Status: She is alert and oriented to person, place, and time.  Psychiatric:        Mood and Affect: Mood normal.        Behavior: Behavior normal.     LABS:      Latest Ref Rng & Units 04/10/2023    1:56 PM 02/26/2023    1:19 PM 12/05/2022    1:22 PM  CBC  WBC 4.0 - 10.5 K/uL 2.9  2.7  3.2   Hemoglobin 12.0 - 15.0 g/dL 16.1  09.6  04.5   Hematocrit 36.0 - 46.0 % 35.4  34.5  36.3   Platelets 150 - 400 K/uL 181  205  190       Latest Ref Rng & Units 04/10/2023    1:56 PM 02/26/2023    1:19 PM 12/12/2022     1:55 PM  CMP  Glucose 70 - 99 mg/dL 85  89    BUN 8 - 23 mg/dL 16  12    Creatinine 4.09 - 1.00 mg/dL 8.11  9.14    Sodium 782 - 145 mmol/L 136  136    Potassium 3.5 - 5.1 mmol/L 3.4  2.7    Chloride 98 - 111 mmol/L 101  99    CO2 22 - 32 mmol/L 27  28    Calcium 8.9 - 10.3 mg/dL 8.8  8.0  9.4   Total Protein 6.5 - 8.1 g/dL 7.0  7.1    Total Bilirubin 0.3 - 1.2 mg/dL 0.5  0.6    Alkaline Phos 38 - 126 U/L 111  93    AST 15 - 41 U/L 21  24    ALT 0 - 44 U/L 31  21       No results found for: "CEA1", "CEA" / No results found for: "CEA1", "CEA" No results found for: "PSA1" No results found for: "CAN199" No results found for: "CAN125"  Lab Results  Component Value Date   TOTALPROTELP 6.4 04/10/2023   ALBUMINELP 3.5 04/10/2023   A1GS 0.2 04/10/2023   A2GS 0.7 04/10/2023   BETS 1.0 04/10/2023   GAMS 1.0 04/10/2023   MSPIKE Not Observed 04/10/2023  SPEI Comment 04/10/2023   No results found for: "TIBC", "FERRITIN", "IRONPCTSAT" Lab Results  Component Value Date   LDH 94 (L) 04/10/2023   LDH 98 02/26/2023   LDH 102 12/05/2022     STUDIES:   No results found.

## 2023-04-16 ENCOUNTER — Other Ambulatory Visit: Payer: Self-pay

## 2023-04-16 ENCOUNTER — Inpatient Hospital Stay: Payer: Medicare HMO | Attending: Hematology | Admitting: Hematology

## 2023-04-16 VITALS — BP 144/68 | HR 71 | Temp 97.2°F | Resp 16 | Wt 161.1 lb

## 2023-04-16 DIAGNOSIS — Z79899 Other long term (current) drug therapy: Secondary | ICD-10-CM | POA: Diagnosis not present

## 2023-04-16 DIAGNOSIS — Z8379 Family history of other diseases of the digestive system: Secondary | ICD-10-CM | POA: Insufficient documentation

## 2023-04-16 DIAGNOSIS — Z818 Family history of other mental and behavioral disorders: Secondary | ICD-10-CM | POA: Diagnosis not present

## 2023-04-16 DIAGNOSIS — R519 Headache, unspecified: Secondary | ICD-10-CM | POA: Insufficient documentation

## 2023-04-16 DIAGNOSIS — Z800 Family history of malignant neoplasm of digestive organs: Secondary | ICD-10-CM | POA: Diagnosis not present

## 2023-04-16 DIAGNOSIS — Z833 Family history of diabetes mellitus: Secondary | ICD-10-CM | POA: Insufficient documentation

## 2023-04-16 DIAGNOSIS — C9000 Multiple myeloma not having achieved remission: Secondary | ICD-10-CM | POA: Insufficient documentation

## 2023-04-16 DIAGNOSIS — Z853 Personal history of malignant neoplasm of breast: Secondary | ICD-10-CM | POA: Diagnosis not present

## 2023-04-16 DIAGNOSIS — M5489 Other dorsalgia: Secondary | ICD-10-CM | POA: Diagnosis not present

## 2023-04-16 DIAGNOSIS — R200 Anesthesia of skin: Secondary | ICD-10-CM | POA: Diagnosis not present

## 2023-04-16 DIAGNOSIS — Z809 Family history of malignant neoplasm, unspecified: Secondary | ICD-10-CM | POA: Insufficient documentation

## 2023-04-16 DIAGNOSIS — Z8249 Family history of ischemic heart disease and other diseases of the circulatory system: Secondary | ICD-10-CM | POA: Diagnosis not present

## 2023-04-16 DIAGNOSIS — C9001 Multiple myeloma in remission: Secondary | ICD-10-CM | POA: Diagnosis not present

## 2023-04-16 DIAGNOSIS — E876 Hypokalemia: Secondary | ICD-10-CM | POA: Diagnosis not present

## 2023-04-16 MED ORDER — LENALIDOMIDE 5 MG PO CAPS
5.0000 mg | ORAL_CAPSULE | Freq: Every day | ORAL | 0 refills | Status: DC
Start: 2023-04-16 — End: 2023-05-14

## 2023-04-16 NOTE — Progress Notes (Signed)
Chart reviewed. Revlimid refilled per verbal order from Dr. Katragadda. 

## 2023-04-16 NOTE — Patient Instructions (Addendum)
[facility]  Discharge Instructions  You were seen and examined today by Dr. Ellin Saba.  Dr. Ellin Saba discussed your most recent lab work which revealed that your magnesium is slightly low but all the other labs look good and stable.  Dr. Ellin Saba is sending in 5 mg Revlimid for your to restart. Restart the magnesium every other day.  Follow-up as scheduled in 3 months.    Thank you for choosing [facility] to provide your oncology and hematology care.   To afford each patient quality time with our provider, please arrive at least 15 minutes before your scheduled appointment time. You may need to reschedule your appointment if you arrive late (10 or more minutes). Arriving late affects you and other patients whose appointments are after yours.  Also, if you miss three or more appointments without notifying the office, you may be dismissed from the clinic at the provider's discretion.    Again, thank you for choosing [facility]  Our hope is that these requests will decrease the amount of time that you wait before being seen by our physicians.   If you have a lab appointment with the Cancer Center - please note that after April 8th, all labs will be drawn in the cancer center.  You do not have to check in or register with the main entrance as you have in the past but will complete your check-in at the cancer center.            _____________________________________________________________  Should you have questions after your visit to [facility], please contact our office at [phone] and follow the prompts.  Our office hours are 8:00 a.m. to 4:30 p.m. Monday - Thursday and 8:00 a.m. to 2:30 p.m. Friday.  Please note that voicemails left after 4:00 p.m. may not be returned until the following business day.  We are closed weekends and all major holidays.  You do have access to a nurse 24-7, just call the  main number to the clinic [phone] and do not press any options, hold on the line and a nurse will answer the phone.    For prescription refill requests, have your pharmacy contact our office and allow 72 hours.    Masks are no longer required in the cancer centers. If you would like for your care team to wear a mask while they are taking care of you, please let them know. You may have one support person who is at least 68 years old accompany you for your appointments.

## 2023-05-12 ENCOUNTER — Other Ambulatory Visit: Payer: Self-pay | Admitting: Hematology

## 2023-05-12 DIAGNOSIS — C9001 Multiple myeloma in remission: Secondary | ICD-10-CM

## 2023-05-14 ENCOUNTER — Other Ambulatory Visit: Payer: Self-pay

## 2023-05-14 ENCOUNTER — Encounter (HOSPITAL_COMMUNITY): Payer: Self-pay | Admitting: Hematology

## 2023-05-14 DIAGNOSIS — C9001 Multiple myeloma in remission: Secondary | ICD-10-CM

## 2023-05-14 MED ORDER — LENALIDOMIDE 5 MG PO CAPS
5.0000 mg | ORAL_CAPSULE | Freq: Every day | ORAL | 0 refills | Status: AC
Start: 2023-05-14 — End: ?

## 2023-05-14 NOTE — Telephone Encounter (Signed)
Chart reviewed. Revlimid refilled per last office note with Dr. Katragadda.  

## 2023-05-28 ENCOUNTER — Encounter (INDEPENDENT_AMBULATORY_CARE_PROVIDER_SITE_OTHER): Payer: Self-pay | Admitting: *Deleted

## 2023-06-08 ENCOUNTER — Other Ambulatory Visit: Payer: Self-pay | Admitting: Hematology

## 2023-06-08 DIAGNOSIS — C9001 Multiple myeloma in remission: Secondary | ICD-10-CM

## 2023-06-10 ENCOUNTER — Encounter (HOSPITAL_COMMUNITY): Payer: Self-pay | Admitting: Hematology

## 2023-06-11 ENCOUNTER — Other Ambulatory Visit: Payer: Self-pay

## 2023-06-11 DIAGNOSIS — C9001 Multiple myeloma in remission: Secondary | ICD-10-CM

## 2023-06-11 MED ORDER — LENALIDOMIDE 5 MG PO CAPS
5.0000 mg | ORAL_CAPSULE | Freq: Every day | ORAL | 0 refills | Status: DC
Start: 2023-06-11 — End: 2023-07-09

## 2023-06-11 NOTE — Telephone Encounter (Signed)
Chart reviewed. Revlimid refilled per last office note with Dr. Katragadda.  

## 2023-06-19 ENCOUNTER — Other Ambulatory Visit: Payer: Self-pay | Admitting: *Deleted

## 2023-06-19 MED ORDER — POTASSIUM CHLORIDE 10 % PO SOLN: 20.0000 meq | Freq: Two times a day (BID) | ORAL | 3 refills | Status: DC

## 2023-07-01 ENCOUNTER — Other Ambulatory Visit (HOSPITAL_COMMUNITY): Payer: Self-pay | Admitting: Internal Medicine

## 2023-07-01 DIAGNOSIS — Z1231 Encounter for screening mammogram for malignant neoplasm of breast: Secondary | ICD-10-CM

## 2023-07-03 ENCOUNTER — Other Ambulatory Visit: Payer: Self-pay | Admitting: Hematology

## 2023-07-03 DIAGNOSIS — C9001 Multiple myeloma in remission: Secondary | ICD-10-CM

## 2023-07-04 ENCOUNTER — Encounter (HOSPITAL_COMMUNITY): Payer: Self-pay | Admitting: Hematology

## 2023-07-09 ENCOUNTER — Other Ambulatory Visit: Payer: Self-pay

## 2023-07-09 DIAGNOSIS — C9001 Multiple myeloma in remission: Secondary | ICD-10-CM

## 2023-07-09 MED ORDER — LENALIDOMIDE 5 MG PO CAPS
5.0000 mg | ORAL_CAPSULE | Freq: Every day | ORAL | 0 refills | Status: DC
Start: 2023-07-09 — End: 2023-07-31

## 2023-07-09 NOTE — Telephone Encounter (Signed)
Chart reviewed. Revlimid refilled per last office note with Dr. Katragadda.  

## 2023-07-15 ENCOUNTER — Ambulatory Visit (HOSPITAL_COMMUNITY)
Admission: RE | Admit: 2023-07-15 | Discharge: 2023-07-15 | Disposition: A | Discharge status: Home / Self Care | Payer: Medicare HMO | Source: Ambulatory Visit | Attending: Internal Medicine | Admitting: Internal Medicine

## 2023-07-15 ENCOUNTER — Other Ambulatory Visit: Payer: Self-pay

## 2023-07-15 DIAGNOSIS — C9000 Multiple myeloma not having achieved remission: Secondary | ICD-10-CM

## 2023-07-15 DIAGNOSIS — Z1231 Encounter for screening mammogram for malignant neoplasm of breast: Secondary | ICD-10-CM | POA: Insufficient documentation

## 2023-07-15 NOTE — Progress Notes (Signed)
Lab orders entered

## 2023-07-16 ENCOUNTER — Inpatient Hospital Stay: Payer: Medicare HMO

## 2023-07-16 ENCOUNTER — Inpatient Hospital Stay: Payer: Medicare HMO | Attending: Hematology

## 2023-07-16 ENCOUNTER — Ambulatory Visit: Payer: Medicare HMO | Admitting: Hematology

## 2023-07-16 DIAGNOSIS — M5489 Other dorsalgia: Secondary | ICD-10-CM | POA: Diagnosis not present

## 2023-07-16 DIAGNOSIS — Z79899 Other long term (current) drug therapy: Secondary | ICD-10-CM | POA: Insufficient documentation

## 2023-07-16 DIAGNOSIS — E611 Iron deficiency: Secondary | ICD-10-CM | POA: Diagnosis not present

## 2023-07-16 DIAGNOSIS — C9000 Multiple myeloma not having achieved remission: Secondary | ICD-10-CM | POA: Insufficient documentation

## 2023-07-16 DIAGNOSIS — E876 Hypokalemia: Secondary | ICD-10-CM | POA: Insufficient documentation

## 2023-07-16 LAB — IRON AND TIBC
Iron: 88 ug/dL (ref 28–170)
Saturation Ratios: 15 % (ref 10.4–31.8)
TIBC: 585 ug/dL — ABNORMAL HIGH (ref 250–450)
UIBC: 497 ug/dL

## 2023-07-16 LAB — CBC WITH DIFFERENTIAL/PLATELET
Abs Immature Granulocytes: 0.01 10*3/uL (ref 0.00–0.07)
Basophils Absolute: 0 10*3/uL (ref 0.0–0.1)
Basophils Relative: 1 %
Eosinophils Absolute: 0.1 10*3/uL (ref 0.0–0.5)
Eosinophils Relative: 2 %
HCT: 39.5 % (ref 36.0–46.0)
Hemoglobin: 12.6 g/dL (ref 12.0–15.0)
Immature Granulocytes: 0 %
Lymphocytes Relative: 42 %
Lymphs Abs: 1.4 10*3/uL (ref 0.7–4.0)
MCH: 30.5 pg (ref 26.0–34.0)
MCHC: 31.9 g/dL (ref 30.0–36.0)
MCV: 95.6 fL (ref 80.0–100.0)
Monocytes Absolute: 0.4 10*3/uL (ref 0.1–1.0)
Monocytes Relative: 13 %
Neutro Abs: 1.4 10*3/uL — ABNORMAL LOW (ref 1.7–7.7)
Neutrophils Relative %: 42 %
Platelets: 211 10*3/uL (ref 150–400)
RBC: 4.13 MIL/uL (ref 3.87–5.11)
RDW: 13.6 % (ref 11.5–15.5)
WBC: 3.3 10*3/uL — ABNORMAL LOW (ref 4.0–10.5)
nRBC: 0 % (ref 0.0–0.2)

## 2023-07-16 LAB — FERRITIN: Ferritin: 15 ng/mL (ref 11–307)

## 2023-07-16 LAB — COMPREHENSIVE METABOLIC PANEL
ALT: 20 U/L (ref 0–44)
AST: 20 U/L (ref 15–41)
Albumin: 3.9 g/dL (ref 3.5–5.0)
Alkaline Phosphatase: 100 U/L (ref 38–126)
Anion gap: 10 (ref 5–15)
BUN: 21 mg/dL (ref 8–23)
CO2: 25 mmol/L (ref 22–32)
Calcium: 9.2 mg/dL (ref 8.9–10.3)
Chloride: 100 mmol/L (ref 98–111)
Creatinine, Ser: 1.04 mg/dL — ABNORMAL HIGH (ref 0.44–1.00)
GFR, Estimated: 59 mL/min — ABNORMAL LOW (ref >60)
Glucose, Bld: 92 mg/dL (ref 70–99)
Potassium: 3.5 mmol/L (ref 3.5–5.1)
Sodium: 135 mmol/L (ref 135–145)
Total Bilirubin: 0.5 mg/dL (ref <1.2)
Total Protein: 7.6 g/dL (ref 6.5–8.1)

## 2023-07-17 LAB — KAPPA/LAMBDA LIGHT CHAINS
Kappa free light chain: 41.6 mg/L — ABNORMAL HIGH (ref 3.3–19.4)
Kappa, lambda light chain ratio: 1.4 (ref 0.26–1.65)
Lambda free light chains: 29.8 mg/L — ABNORMAL HIGH (ref 5.7–26.3)

## 2023-07-17 LAB — PROTEIN ELECTROPHORESIS, SERUM
A/G Ratio: 1 (ref 0.7–1.7)
Albumin ELP: 3.5 g/dL (ref 2.9–4.4)
Alpha-1-Globulin: 0.3 g/dL (ref 0.0–0.4)
Alpha-2-Globulin: 0.8 g/dL (ref 0.4–1.0)
Beta Globulin: 1.2 g/dL (ref 0.7–1.3)
Gamma Globulin: 1.3 g/dL (ref 0.4–1.8)
Globulin, Total: 3.6 g/dL (ref 2.2–3.9)
Total Protein ELP: 7.1 g/dL (ref 6.0–8.5)

## 2023-07-22 LAB — IMMUNOFIXATION ELECTROPHORESIS
IgA: 295 mg/dL (ref 87–352)
IgG (Immunoglobin G), Serum: 1446 mg/dL (ref 586–1602)
IgM (Immunoglobulin M), Srm: 36 mg/dL (ref 26–217)
Total Protein ELP: 7.2 g/dL (ref 6.0–8.5)

## 2023-07-23 ENCOUNTER — Inpatient Hospital Stay: Payer: Medicare HMO | Admitting: Hematology

## 2023-07-23 ENCOUNTER — Other Ambulatory Visit: Payer: Medicare HMO

## 2023-07-23 VITALS — BP 133/66 | HR 62 | Temp 98.2°F | Resp 16 | Wt 164.5 lb

## 2023-07-23 DIAGNOSIS — E876 Hypokalemia: Secondary | ICD-10-CM | POA: Diagnosis not present

## 2023-07-23 DIAGNOSIS — M546 Pain in thoracic spine: Secondary | ICD-10-CM

## 2023-07-23 DIAGNOSIS — Z79899 Other long term (current) drug therapy: Secondary | ICD-10-CM | POA: Diagnosis not present

## 2023-07-23 DIAGNOSIS — D508 Other iron deficiency anemias: Secondary | ICD-10-CM | POA: Diagnosis not present

## 2023-07-23 DIAGNOSIS — E611 Iron deficiency: Secondary | ICD-10-CM | POA: Diagnosis not present

## 2023-07-23 DIAGNOSIS — C9001 Multiple myeloma in remission: Secondary | ICD-10-CM

## 2023-07-23 DIAGNOSIS — M5489 Other dorsalgia: Secondary | ICD-10-CM | POA: Diagnosis not present

## 2023-07-23 DIAGNOSIS — M545 Low back pain, unspecified: Secondary | ICD-10-CM | POA: Diagnosis not present

## 2023-07-23 DIAGNOSIS — C9000 Multiple myeloma not having achieved remission: Secondary | ICD-10-CM | POA: Diagnosis not present

## 2023-07-23 NOTE — Progress Notes (Signed)
[facility] [address] [place] [address]    Clinic Day:  07/23/23   Referring physician: Gareth Morgan, MD  Patient Care Team: Lupita Raider, NP as PCP - General (Family Medicine) [name], MD as Consulting Physician (Hematology) Eddie Candle, MD as Referring Physician (Internal Medicine)   ASSESSMENT & PLAN:   Assessment: 1. 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  Revlimid started on 10/06/2019 due to delay. -Velcade held since 12/15/2019 due to gastric symptoms, which have resolved completely. -She was evaluated by Dr. Donnie Coffin at [facility].  Recommended to have immediate bone marrow transplant. -She has compression fractures of T8, T10 and T12.  Also L1 and L3 compression fractures. -Revlimid and dexamethasone continued until 03/22/2020. 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 -Revlimid 10 mg daily started around first week of December 2021. - Revlimid dose decreased to 10 mg 3 weeks on 1 week off on 01/04/2021 due to worsening back pain.  Revlimid dose decreased to 5 mg 3 weeks on/1 week off on 04/16/2023 due to decreased energy levels and diarrhea.   2.  Upper/mid back pain: -MRI on 07/13/2020 shows T7, T8, T12, L1 and L2 compression/superior endplate fractures.  Height loss is greatest and moderate at T7 and T8.  No evidence of bone lesions. - She was previously evaluated by IR for vertebroplasty which was not recommended.   Plan:  1.  IgA lambda plasma cell myeloma: - Revlimid was dose reduced to 5 mg 3 weeks on/1 week off on 04/16/2023. - Diarrhea has improved.  Energy levels improved.  She is taking Questran 1 packet once a week to help with her stools. - She reports back pain is occasionally worse since last visit. -.  Reviewed labs from 07/16/2023: M spike not detected.  FLC ratio is normal at 1.4.  Lambda light chains at  29.8 and stable.  Immunofixation is pending.  CBC grossly normal with mild leukopenia.  Calcium and creatinine are stable. - Recommend MRI of the thoracic and lumbar spine with and without contrast. - Phone visit after MRI.  Otherwise RTC in 3 months with repeat myeloma labs.  Continue Revlimid 5 mg 3 weeks on/1 week off.  2.  Bone protection: - Last dose of Zometa on 12/12/2022.  Will restart Zometa if any recurrence of myeloma.  3.  Hypokalemia/hypomagnesemia: - Continue potassium 20 mEq daily and magnesium every other day.  Potassium is normal.  4.  Iron deficiency state: - Ferritin is 15.  She cannot tolerate oral iron therapy.  Recommend INFeD 1 g x 1. - Discussed rare chance of anaphylactic reaction.   Orders Placed This Encounter  Procedures   MR Thoracic Spine W Wo Contrast    Standing Status:   Future    Standing Expiration Date:   07/22/2024    Order Specific Question:   GRA to provide read?    Answer:   Yes    Order Specific Question:   If indicated for the ordered procedure, I authorize the administration of contrast media per Radiology protocol    Answer:   Yes    Order Specific Question:   What is the patient's sedation requirement?    Answer:   No Sedation    Order Specific Question:   Use SRS Protocol?    Answer:   No    Order Specific Question:   Does the patient have  a pacemaker or implanted devices?    Answer:   No    Order Specific Question:   Preferred imaging location?    Answer:   [facility] (table limit [phone])   MR Lumbar Spine W Wo Contrast    Standing Status:   Future    Standing Expiration Date:   07/22/2024    Order Specific Question:   If indicated for the ordered procedure, I authorize the administration of contrast media per Radiology protocol    Answer:   Yes    Order Specific Question:   What is the patient's sedation requirement?    Answer:   No Sedation    Order Specific Question:   Does the patient have a pacemaker or implanted  devices?    Answer:   No    Order Specific Question:   Use SRS Protocol?    Answer:   No    Order Specific Question:   Preferred imaging location?    Answer:   [facility] (table limit - 550lbs)   CBC with Differential    Standing Status:   Future    Standing Expiration Date:   07/22/2024   Comprehensive metabolic panel    Standing Status:   Future    Standing Expiration Date:   07/22/2024   Magnesium    Standing Status:   Future    Standing Expiration Date:   07/22/2024   Iron and TIBC ([facility] ONLY)    Standing Status:   Future    Standing Expiration Date:   07/22/2024   Ferritin    Standing Status:   Future    Standing Expiration Date:   07/22/2024   Kappa/lambda light chains    Standing Status:   Future    Standing Expiration Date:   07/22/2024   Immunofixation electrophoresis    Standing Status:   Future    Standing Expiration Date:   07/22/2024   Protein electrophoresis, serum    Standing Status:   Future    Standing Expiration Date:   07/22/2024      Stephanie Galvan,acting as a scribe for [name], MD.,have documented all relevant documentation on the behalf of [name], MD,as directed by  [name], MD while in the presence of [name], MD.  I, [name] MD, have reviewed the above documentation for accuracy and completeness, and I agree with the above.     [name], MD   11/12/20244:55 PM  CHIEF COMPLAINT:   Diagnosis: Multiple myeloma   Cancer Staging  Multiple myeloma (HCC) Staging form: Plasma Cell Myeloma and Plasma Cell Disorders, AJCC 8th Edition - Clinical stage from 08/22/2019: RISS Stage II (Beta-2-microglobulin (mg/L): 3.8, Albumin (g/dL): 3.2, ISS: Stage II, High-risk cytogenetics: Absent, LDH: Normal) - Signed by [name], MD on 08/22/2019    Prior Therapy: 1. RVD x 6 cycles from 08/19/2019 to 12/15/2019. 2. Autotransplant on  04/29/2020.  Current Therapy: Revlimid 10 mg daily; Zometa every 12 weeks    HISTORY OF PRESENT ILLNESS:   Oncology History  Multiple myeloma (HCC)  08/22/2019 Initial Diagnosis   Multiple myeloma (HCC)   08/22/2019 Cancer Staging   Staging form: Plasma Cell Myeloma and Plasma Cell Disorders, AJCC 8th Edition - Clinical stage from 08/22/2019: RISS Stage II (Beta-2-microglobulin (mg/L): 3.8, Albumin (g/dL): 3.2, ISS: Stage II, High-risk cytogenetics: Absent, LDH: Normal) - Signed by [name], MD on 08/22/2019      INTERVAL HISTORY:   Stephanie Galvan is a 67  y.o. female presenting to clinic today for follow up of multiple myeloma. She was last seen by me on 04/16/23.  Today, she states that she is doing well overall. Her appetite level is at 100%. Her energy level is at 80%. Neuropathy in feet and legs is stable. She denies any issues with Revlimid, including GI issues. She is taking one pack of Questran once a week that effectively treats diarrhea without causing constipation. She took iron pills a long time ago. She reports a slight decrease in energy. She is taking potassium and magnesium as prescribed.   She reports waxing and waning lower back pain that radiates to the mid back for the last 4 years and notes a history of fractures in the back. Pain is worsening. She believes pain may be due to inflammation. She states certain seated positions and standing up worsen pain. She would like to receive seasonal vaccinations to keep her up-to-date.   PAST MEDICAL HISTORY:   Past Medical History: Past Medical History:  Diagnosis Date   Breast cancer (HCC) 2006   left breast   Breast mass, right    Compression fracture of cervical spine (HCC) 06/18/2019   recent Chest xray stating had fx.   High cholesterol    History of bladder infections    Hypertension    Multiple myeloma (HCC) 2021   bone marrow transplant 2021   Rash 04/2012    Surgical History: Past Surgical History:   Procedure Laterality Date   ABDOMINAL HYSTERECTOMY     just uterus removed   BREAST BIOPSY     BREAST LUMPECTOMY WITH RADIOACTIVE SEED LOCALIZATION Right 07/24/2018   Procedure: RIGHT BREAST LUMPECTOMY WITH RADIOACTIVE SEED LOCALIZATION;  Surgeon: Abigail Miyamoto, MD;  Location: [facility];  Service: General;  Laterality: Right;   IR RADIOLOGIST EVAL & MGMT  07/28/2020   MASTECTOMY  5/06   left   PARTIAL HYSTERECTOMY     PORT-A-CATH REMOVAL     PORTACATH PLACEMENT     THYROGLOSSAL DUCT CYST N/A 03/11/2023   Procedure: EXCISION OF THYROGLOSSAL DUCT CYST;  Surgeon: Serena Colonel, MD;  Location: [facility] OR;  Service: ENT;  Laterality: N/A;    Social History: Social History   Socioeconomic History   Marital status: Married    Spouse name: Not on file   Number of children: 2   Years of education: Not on file   Highest education level: Not on file  Occupational History    Employer: [employer]  Tobacco Use   Smoking status: Never   Smokeless tobacco: Never  Vaping Use   Vaping status: Never Used  Substance and Sexual Activity   Alcohol use: No   Drug use: No   Sexual activity: Yes    Birth control/protection: Surgical  Other Topics Concern   Not on file  Social History Narrative   Not on file   Social Determinants of Health   Financial Resource Strain: Low Risk  (07/20/2020)   Overall Financial Resource Strain (CARDIA)    Difficulty of Paying Living Expenses: Not hard at all  Food Insecurity: Low Risk  (03/21/2023)   Received from [facility]   Hunger Vital Sign    Worried About Running Out of Food in the Last Year: Never true    Ran Out of Food in the Last Year: Never true  Transportation Needs: Not on file (03/21/2023)  Physical Activity: Inactive (07/20/2020)   Exercise Vital Sign    Days of Exercise per Week: 0  days    Minutes of Exercise per Session: 0 min  Stress: No Stress Concern Present (07/20/2020)   [employer] of Occupational Health -  Occupational Stress Questionnaire    Feeling of Stress : Not at all  Social Connections: Moderately Integrated (07/20/2020)   Social Connection and Isolation Panel [NHANES]    Frequency of Communication with Friends and Family: More than three times a week    Frequency of Social Gatherings with Friends and Family: Never    Attends Religious Services: More than 4 times per year    Active Member of [employer] or Organizations: No    Attends Banker Meetings: Never    Marital Status: Married  Catering manager Violence: Not At Risk (07/20/2020)   Humiliation, Afraid, Rape, and Kick questionnaire    Fear of Current or Ex-Partner: No    Emotionally Abused: No    Physically Abused: No    Sexually Abused: No    Family History: Family History  Problem Relation Age of Onset   Dementia Mother    Stomach cancer Father    Cancer Brother    Cancer Brother    Diabetes Brother    Colitis Daughter    Hypertension Daughter    Hypertension Daughter     Current Medications:  Current Outpatient Medications:    aspirin EC 81 MG tablet, Take 81 mg by mouth daily. Swallow whole., Disp: , Rfl:    atenolol (TENORMIN) 25 MG tablet, Take 25 mg by mouth daily., Disp: , Rfl:    calcium carbonate (TUMS - DOSED IN MG ELEMENTAL CALCIUM) 500 MG chewable tablet, Chew 1 tablet by mouth 2 (two) times daily., Disp: , Rfl:    cholestyramine (QUESTRAN) 4 g packet, Take 1 packet (4 g total) by mouth 3 (three) times daily with meals., Disp: 90 each, Rfl: 12   hydrochlorothiazide (HYDRODIURIL) 25 MG tablet, Take 25 mg by mouth daily., Disp: , Rfl:    lenalidomide (REVLIMID) 5 MG capsule, Take 1 capsule (5 mg total) by mouth daily. TAKE 1 CAPSULE BY MOUTH EVERY DAY FOR 3 WEEKS ON THEN 1 WEEK OFF FOR A 28 DAY SUPPLY, Disp: 21 capsule, Rfl: 0   Potassium Chloride 10 % SOLN, Take 20 mEq by mouth in the morning and at bedtime., Disp: 946 mL, Rfl: 3   Allergies: No Known Allergies  REVIEW OF SYSTEMS:   Review  of Systems  Constitutional:  Negative for chills, fatigue and fever.  HENT:   Negative for lump/mass, mouth sores, nosebleeds, sore throat and trouble swallowing.   Eyes:  Negative for eye problems.  Respiratory:  Negative for cough and shortness of breath.   Cardiovascular:  Negative for chest pain, leg swelling and palpitations.  Gastrointestinal:  Negative for abdominal pain, constipation, diarrhea, nausea and vomiting.  Genitourinary:  Negative for bladder incontinence, difficulty urinating, dysuria, frequency, hematuria and nocturia.   Musculoskeletal:  Positive for back pain (8/10 severity). Negative for arthralgias, flank pain, myalgias and neck pain.  Skin:  Negative for itching and rash.  Neurological:  Positive for numbness (in feet and legs). Negative for dizziness and headaches.  Hematological:  Does not bruise/bleed easily.  Psychiatric/Behavioral:  Positive for sleep disturbance. Negative for depression and suicidal ideas. The patient is not nervous/anxious.   All other systems reviewed and are negative.    VITALS:   Blood pressure 133/66, pulse 62, temperature 98.2 F (36.8 C), temperature source Oral, resp. rate 16, weight 164 lb 8 oz (74.6 kg), SpO2 100%.  Wt Readings from Last 3 Encounters:  07/23/23 164 lb 8 oz (74.6 kg)  04/16/23 161 lb 1.6 oz (73.1 kg)  03/19/23 157 lb 4.8 oz (71.4 kg)    Body mass index is 27.37 kg/m.  Performance status (ECOG): 1 - Symptomatic but completely ambulatory  PHYSICAL EXAM:   Physical Exam Vitals and nursing note reviewed. Exam conducted with a chaperone present.  Constitutional:      Appearance: Normal appearance.  Cardiovascular:     Rate and Rhythm: Normal rate and regular rhythm.     Pulses: Normal pulses.     Heart sounds: Normal heart sounds.  Pulmonary:     Effort: Pulmonary effort is normal.     Breath sounds: Normal breath sounds.  Abdominal:     Palpations: Abdomen is soft. There is no hepatomegaly, splenomegaly  or mass.     Tenderness: There is no abdominal tenderness.  Musculoskeletal:     Right lower leg: No edema.     Left lower leg: No edema.  Lymphadenopathy:     Cervical: No cervical adenopathy.     Right cervical: No superficial, deep or posterior cervical adenopathy.    Left cervical: No superficial, deep or posterior cervical adenopathy.     Upper Body:     Right upper body: No supraclavicular or axillary adenopathy.     Left upper body: No supraclavicular or axillary adenopathy.  Neurological:     General: No focal deficit present.     Mental Status: She is alert and oriented to person, place, and time.  Psychiatric:        Mood and Affect: Mood normal.        Behavior: Behavior normal.     LABS:      Latest Ref Rng & Units 07/16/2023    3:08 PM 04/10/2023    1:56 PM 02/26/2023    1:19 PM  CBC  WBC 4.0 - 10.5 K/uL 3.3  2.9  2.7   Hemoglobin 12.0 - 15.0 g/dL 30.8  65.7  84.6   Hematocrit 36.0 - 46.0 % 39.5  35.4  34.5   Platelets 150 - 400 K/uL 211  181  205       Latest Ref Rng & Units 07/16/2023    3:08 PM 04/10/2023    1:56 PM 02/26/2023    1:19 PM  CMP  Glucose 70 - 99 mg/dL 92  85  89   BUN 8 - 23 mg/dL 21  16  12    Creatinine 0.44 - 1.00 mg/dL 9.62  9.52  8.41   Sodium 135 - 145 mmol/L 135  136  136   Potassium 3.5 - 5.1 mmol/L 3.5  3.4  2.7   Chloride 98 - 111 mmol/L 100  101  99   CO2 22 - 32 mmol/L 25  27  28    Calcium 8.9 - 10.3 mg/dL 9.2  8.8  8.0   Total Protein 6.5 - 8.1 g/dL 7.6  7.0  7.1   Total Bilirubin <1.2 mg/dL 0.5  0.5  0.6   Alkaline Phos 38 - 126 U/L 100  111  93   AST 15 - 41 U/L 20  21  24    ALT 0 - 44 U/L 20  31  21       No results found for: "CEA1", "CEA" / No results found for: "CEA1", "CEA" No results found for: "PSA1" No results found for: "LKG401" No results found for: "UUV253"  Lab Results  Component Value Date  TOTALPROTELP 7.2 07/16/2023   TOTALPROTELP 7.1 07/16/2023   ALBUMINELP 3.5 07/16/2023   A1GS 0.3 07/16/2023    A2GS 0.8 07/16/2023   BETS 1.2 07/16/2023   GAMS 1.3 07/16/2023   MSPIKE Not Observed 07/16/2023   SPEI Comment 07/16/2023   Lab Results  Component Value Date   TIBC 585 (H) 07/16/2023   FERRITIN 15 07/16/2023   IRONPCTSAT 15 07/16/2023   Lab Results  Component Value Date   LDH 94 (L) 04/10/2023   LDH 98 02/26/2023   LDH 102 12/05/2022     STUDIES:   MM 3D SCREENING MAMMOGRAM UNILATERAL RIGHT BREAST  Result Date: 07/17/2023 CLINICAL DATA:  Screening. Personal history of malignant LEFT mastectomy in 2006. EXAM: DIGITAL SCREENING UNILATERAL RIGHT MAMMOGRAM WITH CAD AND TOMOSYNTHESIS TECHNIQUE: Right screening digital craniocaudal and mediolateral oblique mammograms were obtained. Right screening digital breast tomosynthesis was performed. The images were evaluated with computer-aided detection. COMPARISON:  Previous exam(s). ACR Breast Density Category b: There are scattered areas of fibroglandular density. FINDINGS: The patient has had a left mastectomy. There are no findings suspicious for malignancy in the RIGHT breast. IMPRESSION: No mammographic evidence of malignancy. A result letter of this screening mammogram will be mailed directly to the patient. RECOMMENDATION: Screening mammogram in one year.  (Code:[id number]-R-71M) BI-RADS CATEGORY  1: Negative. Electronically Signed   By: Hulan Saas M.D.   On: 07/17/2023 16:10

## 2023-07-23 NOTE — Patient Instructions (Addendum)
[facility] Discharge Instructions   You were seen and examined today by Dr. Ellin Saba.  He reviewed the results of your lab work which are mostly normal/stable. Your iron is low. Dr. Kirtland Bouchard recommends you receive an IV iron infusion. This will be given here in the clinic. You will receive it in one dose. The iron is called Infed.   Continue Revlimid as prescribed.   We will see you back in 3 months. We will repeat lab work prior to this visit.   Return as scheduled.    Thank you for choosing  Cancer Center at [facility] to provide your oncology and hematology care.  To afford each patient quality time with our provider, please arrive at least 15 minutes before your scheduled appointment time.   If you have a lab appointment with the Cancer Center please come in thru the Main Entrance and check in at the main information desk.  You need to re-schedule your appointment should you arrive 10 or more minutes late.  We strive to give you quality time with our providers, and arriving late affects you and other patients whose appointments are after yours.  Also, if you no show three or more times for appointments you may be dismissed from the clinic at the providers discretion.     Again, thank you for choosing [facility]  Our hope is that these requests will decrease the amount of time that you wait before being seen by our physicians.       _____________________________________________________________  Should you have questions after your visit to [facility], please contact our office at [phone] and follow the prompts.  Our office hours are 8:00 a.m. and 4:30 p.m. Monday - Friday.  Please note that voicemails left after 4:00 p.m. may not be returned until the following business day.  We are closed weekends and major holidays.  You do have access to a nurse 24-7, just call the main number to the clinic  [phone] and do not press any options, hold on the line and a nurse will answer the phone.    For prescription refill requests, have your pharmacy contact our office and allow 72 hours.    Due to Covid, you will need to wear a mask upon entering the hospital. If you do not have a mask, a mask will be given to you at the Main Entrance upon arrival. For doctor visits, patients may have 1 support person age 68 or older with them. For treatment visits, patients can not have anyone with them due to social distancing guidelines and our immunocompromised population.

## 2023-07-30 ENCOUNTER — Other Ambulatory Visit: Payer: Self-pay | Admitting: Hematology

## 2023-07-30 DIAGNOSIS — C9001 Multiple myeloma in remission: Secondary | ICD-10-CM

## 2023-07-31 ENCOUNTER — Ambulatory Visit (HOSPITAL_COMMUNITY)
Admission: RE | Admit: 2023-07-31 | Discharge: 2023-07-31 | Disposition: A | Discharge status: Home / Self Care | Payer: Medicare HMO | Source: Ambulatory Visit | Attending: Hematology | Admitting: Hematology

## 2023-07-31 ENCOUNTER — Other Ambulatory Visit: Payer: Self-pay

## 2023-07-31 DIAGNOSIS — M546 Pain in thoracic spine: Secondary | ICD-10-CM | POA: Diagnosis not present

## 2023-07-31 DIAGNOSIS — C9001 Multiple myeloma in remission: Secondary | ICD-10-CM

## 2023-07-31 DIAGNOSIS — M545 Low back pain, unspecified: Secondary | ICD-10-CM | POA: Diagnosis not present

## 2023-07-31 DIAGNOSIS — Z8579 Personal history of other malignant neoplasms of lymphoid, hematopoietic and related tissues: Secondary | ICD-10-CM | POA: Diagnosis not present

## 2023-07-31 DIAGNOSIS — M48061 Spinal stenosis, lumbar region without neurogenic claudication: Secondary | ICD-10-CM | POA: Diagnosis not present

## 2023-07-31 DIAGNOSIS — M40204 Unspecified kyphosis, thoracic region: Secondary | ICD-10-CM | POA: Diagnosis not present

## 2023-07-31 MED ORDER — GADOBUTROL 1 MMOL/ML IV SOLN
7.5000 mL | Freq: Once | INTRAVENOUS | Status: AC | PRN
  Administered 2023-07-31: 7.5 mL via INTRAVENOUS

## 2023-07-31 MED ORDER — LENALIDOMIDE 5 MG PO CAPS: 5.0000 mg | ORAL_CAPSULE | Freq: Every day | ORAL | 0 refills | Status: DC

## 2023-07-31 NOTE — Telephone Encounter (Signed)
Chart reviewed. Revlimid refilled per last office note with Dr. Katragadda.  

## 2023-08-01 ENCOUNTER — Inpatient Hospital Stay: Payer: Medicare HMO

## 2023-08-01 VITALS — BP 132/70 | HR 60 | Temp 97.5°F | Resp 18 | Ht 65.0 in | Wt 164.4 lb

## 2023-08-01 DIAGNOSIS — E876 Hypokalemia: Secondary | ICD-10-CM | POA: Diagnosis not present

## 2023-08-01 DIAGNOSIS — M5489 Other dorsalgia: Secondary | ICD-10-CM | POA: Diagnosis not present

## 2023-08-01 DIAGNOSIS — C9000 Multiple myeloma not having achieved remission: Secondary | ICD-10-CM

## 2023-08-01 DIAGNOSIS — E611 Iron deficiency: Secondary | ICD-10-CM | POA: Diagnosis not present

## 2023-08-01 DIAGNOSIS — Z79899 Other long term (current) drug therapy: Secondary | ICD-10-CM | POA: Diagnosis not present

## 2023-08-01 MED ORDER — SODIUM CHLORIDE 0.9 % IV SOLN: INTRAVENOUS | Status: DC

## 2023-08-01 MED ORDER — CETIRIZINE HCL 10 MG/ML IV SOLN
10.0000 mg | Freq: Once | INTRAVENOUS | Status: AC
  Administered 2023-08-01: 10 mg via INTRAVENOUS
  Filled 2023-08-01: qty 1

## 2023-08-01 MED ORDER — ACETAMINOPHEN 325 MG PO TABS
650.0000 mg | ORAL_TABLET | Freq: Once | ORAL | Status: AC
  Administered 2023-08-01: 650 mg via ORAL
  Filled 2023-08-01: qty 2

## 2023-08-01 MED ORDER — METHYLPREDNISOLONE SODIUM SUCC 125 MG IJ SOLR
125.0000 mg | Freq: Once | INTRAMUSCULAR | Status: AC
  Administered 2023-08-01: 125 mg via INTRAVENOUS
  Filled 2023-08-01: qty 2

## 2023-08-01 MED ORDER — FAMOTIDINE IN NACL 20-0.9 MG/50ML-% IV SOLN
20.0000 mg | Freq: Once | INTRAVENOUS | Status: AC
Start: 2023-08-01 — End: 2023-08-01
  Administered 2023-08-01: 20 mg via INTRAVENOUS
  Filled 2023-08-01: qty 50

## 2023-08-01 MED ORDER — SODIUM CHLORIDE 0.9 % IV SOLN
950.0000 mg | Freq: Once | INTRAVENOUS | Status: AC
  Administered 2023-08-01: 950 mg via INTRAVENOUS
  Filled 2023-08-01: qty 19

## 2023-08-01 MED ORDER — SODIUM CHLORIDE 0.9 % IV SOLN
50.0000 mg | Freq: Once | INTRAVENOUS | Status: AC
  Administered 2023-08-01: 50 mg via INTRAVENOUS
  Filled 2023-08-01: qty 1

## 2023-08-01 NOTE — Patient Instructions (Signed)
 [facility] - A DEPT OF [facility]  Discharge Instructions: Thank you for choosing [facility] to provide your oncology and hematology care.  If you have a lab appointment with the Cancer Center - please note that after April 8th, 2024, all labs will be drawn in the cancer center.  You do not have to check in or register with the main entrance as you have in the past but will complete your check-in in the cancer center.  Wear comfortable clothing and clothing appropriate for easy access to any Portacath or PICC line.   We strive to give you quality time with your provider. You may need to reschedule your appointment if you arrive late (15 or more minutes).  Arriving late affects you and other patients whose appointments are after yours.  Also, if you miss three or more appointments without notifying the office, you may be dismissed from the clinic at the provider's discretion.      For prescription refill requests, have your pharmacy contact our office and allow 72 hours for refills to be completed.    Today you received the following Infed, return as scheduled.   To help prevent nausea and vomiting after your treatment, we encourage you to take your nausea medication as directed.  BELOW ARE SYMPTOMS THAT SHOULD BE REPORTED IMMEDIATELY: *FEVER GREATER THAN 100.4 F (38 C) OR HIGHER *CHILLS OR SWEATING *NAUSEA AND VOMITING THAT IS NOT CONTROLLED WITH YOUR NAUSEA MEDICATION *UNUSUAL SHORTNESS OF BREATH *UNUSUAL BRUISING OR BLEEDING *URINARY PROBLEMS (pain or burning when urinating, or frequent urination) *BOWEL PROBLEMS (unusual diarrhea, constipation, pain near the anus) TENDERNESS IN MOUTH AND THROAT WITH OR WITHOUT PRESENCE OF ULCERS (sore throat, sores in mouth, or a toothache) UNUSUAL RASH, SWELLING OR PAIN  UNUSUAL VAGINAL DISCHARGE OR ITCHING   Items with * indicate a potential emergency and should be followed up as soon as possible or  go to the Emergency Department if any problems should occur.  Please show the CHEMOTHERAPY ALERT CARD or IMMUNOTHERAPY ALERT CARD at check-in to the Emergency Department and triage nurse.  Should you have questions after your visit or need to cancel or reschedule your appointment, please contact [facility] - A DEPT OF Eligha Bridegroom [facility] [phone]  and follow the prompts.  Office hours are 8:00 a.m. to 4:30 p.m. Monday - Friday. Please note that voicemails left after 4:00 p.m. may not be returned until the following business day.  We are closed weekends and major holidays. You have access to a nurse at all times for urgent questions. Please call the main number to the clinic [phone] and follow the prompts.  For any non-urgent questions, you may also contact your provider using MyChart. We now offer e-Visits for anyone 46 and older to request care online for non-urgent symptoms. For details visit [email or web address].   Also download the MyChart app! Go to the app store, search "MyChart", open the app, select , and log in with your MyChart username and password.

## 2023-08-01 NOTE — Progress Notes (Signed)
Patient tolerated iron infusion with no complaints voiced.  Peripheral IV site clean and dry with good blood return noted before and after infusion.  Band aid applied.  VSS with discharge and left in satisfactory condition with no s/s of distress noted.   

## 2023-08-02 ENCOUNTER — Other Ambulatory Visit: Payer: Self-pay

## 2023-08-02 MED ORDER — POTASSIUM CHLORIDE 10 % PO SOLN: 20.0000 meq | Freq: Two times a day (BID) | ORAL | 3 refills | Status: DC

## 2023-08-14 ENCOUNTER — Encounter: Payer: Self-pay | Admitting: Hematology

## 2023-08-14 ENCOUNTER — Inpatient Hospital Stay: Payer: Medicare HMO | Attending: Hematology | Admitting: Hematology

## 2023-08-14 DIAGNOSIS — C9000 Multiple myeloma not having achieved remission: Secondary | ICD-10-CM | POA: Diagnosis not present

## 2023-08-14 NOTE — Progress Notes (Signed)
Virtual Visit via Telephone Note  I connected with Stephanie Galvan on 08/14/23 at  4:00 PM EST by telephone and verified that I am speaking with the correct person using two identifiers.  Location: Patient: At home Provider: At office   I discussed the limitations, risks, security and privacy concerns of performing an evaluation and management service by telephone and the availability of in person appointments. I also discussed with the patient that there may be a patient responsible charge related to this service. The patient expressed understanding and agreed to proceed.   History of Present Illness: Stephanie Galvan is a 67 y.o. female being called to discuss results of L-spine and T-spine MRI done on 07/31/23. She is diagnosed with multiple myeloma in remission, treated with maintenance Revlimid, and was last seen by me on 07/23/23.    Observations/Objective: She reports that pain in the back which radiates from lower back to the upper back has improved since the last visit with me on 07/23/2023.  She is continuing to tolerate dose reduced Revlimid reasonably well.  Assessment and Plan:  1.  Back pain: - I reviewed MRI of the thoracic and lumbar spine.  Results showed new mild superior endplate compression deformity at T12 and enhancement of superior endplate of T11 which is favored to be degenerative.  Nonspecific contrast-enhancing lesion in the inferior facet at the T11 and L3 on the right. - All her recent myeloma labs are within normal limits.  As her pain also improved, I would not do any further testing at this time. - If there is any significant changes in the myeloma labs, will consider repeating MRI in 3 to 6 months.   Follow Up Instructions: She will follow-up with me in 3 months.   I discussed the assessment and treatment plan with the patient. The patient was provided an opportunity to ask questions and all were answered. The patient agreed with the plan and demonstrated  an understanding of the instructions.   The patient was advised to call back or seek an in-person evaluation if the symptoms worsen or if the condition fails to improve as anticipated.  I provided 21 minutes of non-face-to-face time during this encounter.    Alben Deeds Teague,acting as a Neurosurgeon for [name], MD.,have documented all relevant documentation on the behalf of [name], MD,as directed by  [name], MD while in the presence of [name], MD.   I, [name] MD, have reviewed the above documentation for accuracy and completeness, and I agree with the above.    [employer]

## 2023-08-25 ENCOUNTER — Other Ambulatory Visit: Payer: Self-pay | Admitting: Hematology

## 2023-08-25 DIAGNOSIS — C9001 Multiple myeloma in remission: Secondary | ICD-10-CM

## 2023-08-26 ENCOUNTER — Encounter (HOSPITAL_COMMUNITY): Payer: Self-pay | Admitting: Hematology

## 2023-08-26 ENCOUNTER — Other Ambulatory Visit: Payer: Self-pay

## 2023-08-26 DIAGNOSIS — C9001 Multiple myeloma in remission: Secondary | ICD-10-CM

## 2023-08-26 MED ORDER — LENALIDOMIDE 5 MG PO CAPS: 5.0000 mg | ORAL_CAPSULE | Freq: Every day | ORAL | 0 refills | Status: DC

## 2023-08-26 NOTE — Telephone Encounter (Signed)
Chart reviewed. Revlimid refilled per last office note with Dr. Katragadda.  

## 2023-09-15 ENCOUNTER — Telehealth: Payer: Self-pay

## 2023-09-15 NOTE — Telephone Encounter (Signed)
 Oral Oncology Patient Advocate Encounter  Was successful in securing patient a $12,000.00 grant from [facility] to provide copayment coverage for Lenalidomide .  This will keep the out of pocket expense at $0.     [employer] ID: [id number]   The billing information is as follows and has been shared with [facility].    RxBin: [id number] PCN: [id number] Member ID: [id number] Group ID: [id number] Dates of Eligibility: 07/08/23 through 07/06/24  Fund:  Multiple Myeloma - Medicare Access   Morene Potters, CPhT Oncology Pharmacy Patient Advocate  [facility]  [phone] (phone) [phone] (fax)

## 2023-09-18 ENCOUNTER — Other Ambulatory Visit: Payer: Self-pay | Admitting: Hematology

## 2023-09-18 DIAGNOSIS — C9001 Multiple myeloma in remission: Secondary | ICD-10-CM

## 2023-09-19 ENCOUNTER — Other Ambulatory Visit: Payer: Self-pay

## 2023-09-19 DIAGNOSIS — C9001 Multiple myeloma in remission: Secondary | ICD-10-CM

## 2023-09-19 MED ORDER — LENALIDOMIDE 5 MG PO CAPS: 5.0000 mg | ORAL_CAPSULE | Freq: Every day | ORAL | 0 refills | Status: DC

## 2023-09-19 NOTE — Telephone Encounter (Signed)
 Chart reviewed. Revlimid refilled per last in person office note with Dr. Ellin Saba.

## 2023-10-14 ENCOUNTER — Other Ambulatory Visit: Payer: Self-pay | Admitting: Hematology

## 2023-10-22 ENCOUNTER — Inpatient Hospital Stay: Payer: Medicare HMO | Attending: Hematology

## 2023-10-22 ENCOUNTER — Other Ambulatory Visit: Payer: Self-pay | Admitting: Hematology

## 2023-10-22 DIAGNOSIS — Z809 Family history of malignant neoplasm, unspecified: Secondary | ICD-10-CM | POA: Diagnosis not present

## 2023-10-22 DIAGNOSIS — R197 Diarrhea, unspecified: Secondary | ICD-10-CM | POA: Insufficient documentation

## 2023-10-22 DIAGNOSIS — Z79899 Other long term (current) drug therapy: Secondary | ICD-10-CM | POA: Insufficient documentation

## 2023-10-22 DIAGNOSIS — C9000 Multiple myeloma not having achieved remission: Secondary | ICD-10-CM | POA: Insufficient documentation

## 2023-10-22 DIAGNOSIS — Z8249 Family history of ischemic heart disease and other diseases of the circulatory system: Secondary | ICD-10-CM | POA: Insufficient documentation

## 2023-10-22 DIAGNOSIS — M549 Dorsalgia, unspecified: Secondary | ICD-10-CM | POA: Diagnosis not present

## 2023-10-22 DIAGNOSIS — Z90710 Acquired absence of both cervix and uterus: Secondary | ICD-10-CM | POA: Diagnosis not present

## 2023-10-22 DIAGNOSIS — Z800 Family history of malignant neoplasm of digestive organs: Secondary | ICD-10-CM | POA: Insufficient documentation

## 2023-10-22 DIAGNOSIS — Z23 Encounter for immunization: Secondary | ICD-10-CM | POA: Diagnosis not present

## 2023-10-22 DIAGNOSIS — M4854XA Collapsed vertebra, not elsewhere classified, thoracic region, initial encounter for fracture: Secondary | ICD-10-CM | POA: Insufficient documentation

## 2023-10-22 DIAGNOSIS — Z8379 Family history of other diseases of the digestive system: Secondary | ICD-10-CM | POA: Insufficient documentation

## 2023-10-22 DIAGNOSIS — E876 Hypokalemia: Secondary | ICD-10-CM | POA: Diagnosis not present

## 2023-10-22 DIAGNOSIS — C9001 Multiple myeloma in remission: Secondary | ICD-10-CM

## 2023-10-22 DIAGNOSIS — E611 Iron deficiency: Secondary | ICD-10-CM | POA: Diagnosis not present

## 2023-10-22 DIAGNOSIS — Z853 Personal history of malignant neoplasm of breast: Secondary | ICD-10-CM | POA: Insufficient documentation

## 2023-10-22 DIAGNOSIS — R5383 Other fatigue: Secondary | ICD-10-CM | POA: Diagnosis not present

## 2023-10-22 DIAGNOSIS — Z833 Family history of diabetes mellitus: Secondary | ICD-10-CM | POA: Diagnosis not present

## 2023-10-22 DIAGNOSIS — D508 Other iron deficiency anemias: Secondary | ICD-10-CM

## 2023-10-22 LAB — FERRITIN: Ferritin: 242 ng/mL (ref 11–307)

## 2023-10-22 LAB — CBC WITH DIFFERENTIAL/PLATELET
Abs Immature Granulocytes: 0.01 10*3/uL (ref 0.00–0.07)
Basophils Absolute: 0 10*3/uL (ref 0.0–0.1)
Basophils Relative: 1 %
Eosinophils Absolute: 0.2 10*3/uL (ref 0.0–0.5)
Eosinophils Relative: 5 %
HCT: 39.5 % (ref 36.0–46.0)
Hemoglobin: 13.1 g/dL (ref 12.0–15.0)
Immature Granulocytes: 0 %
Lymphocytes Relative: 27 %
Lymphs Abs: 1 10*3/uL (ref 0.7–4.0)
MCH: 32.6 pg (ref 26.0–34.0)
MCHC: 33.2 g/dL (ref 30.0–36.0)
MCV: 98.3 fL (ref 80.0–100.0)
Monocytes Absolute: 0.5 10*3/uL (ref 0.1–1.0)
Monocytes Relative: 14 %
Neutro Abs: 1.9 10*3/uL (ref 1.7–7.7)
Neutrophils Relative %: 53 %
Platelets: 206 10*3/uL (ref 150–400)
RBC: 4.02 MIL/uL (ref 3.87–5.11)
RDW: 14.6 % (ref 11.5–15.5)
WBC: 3.6 10*3/uL — ABNORMAL LOW (ref 4.0–10.5)
nRBC: 0 % (ref 0.0–0.2)

## 2023-10-22 LAB — COMPREHENSIVE METABOLIC PANEL
ALT: 29 U/L (ref 0–44)
AST: 33 U/L (ref 15–41)
Albumin: 3.8 g/dL (ref 3.5–5.0)
Alkaline Phosphatase: 92 U/L (ref 38–126)
Anion gap: 9 (ref 5–15)
BUN: 19 mg/dL (ref 8–23)
CO2: 29 mmol/L (ref 22–32)
Calcium: 9.6 mg/dL (ref 8.9–10.3)
Chloride: 98 mmol/L (ref 98–111)
Creatinine, Ser: 0.81 mg/dL (ref 0.44–1.00)
GFR, Estimated: >60 mL/min (ref >60)
Glucose, Bld: 83 mg/dL (ref 70–99)
Potassium: 3.2 mmol/L — ABNORMAL LOW (ref 3.5–5.1)
Sodium: 136 mmol/L (ref 135–145)
Total Bilirubin: 0.8 mg/dL (ref 0.0–1.2)
Total Protein: 7.4 g/dL (ref 6.5–8.1)

## 2023-10-22 LAB — IRON AND TIBC
Iron: 96 ug/dL (ref 28–170)
Saturation Ratios: 22 % (ref 10.4–31.8)
TIBC: 437 ug/dL (ref 250–450)
UIBC: 341 ug/dL

## 2023-10-22 LAB — MAGNESIUM: Magnesium: 1.8 mg/dL (ref 1.7–2.4)

## 2023-10-23 ENCOUNTER — Other Ambulatory Visit: Payer: Self-pay

## 2023-10-23 ENCOUNTER — Other Ambulatory Visit: Payer: Self-pay | Admitting: *Deleted

## 2023-10-23 DIAGNOSIS — H524 Presbyopia: Secondary | ICD-10-CM | POA: Diagnosis not present

## 2023-10-23 DIAGNOSIS — C9001 Multiple myeloma in remission: Secondary | ICD-10-CM

## 2023-10-23 LAB — KAPPA/LAMBDA LIGHT CHAINS
Kappa free light chain: 40 mg/L — ABNORMAL HIGH (ref 3.3–19.4)
Kappa, lambda light chain ratio: 1.47 (ref 0.26–1.65)
Lambda free light chains: 27.3 mg/L — ABNORMAL HIGH (ref 5.7–26.3)

## 2023-10-23 MED ORDER — LENALIDOMIDE 5 MG PO CAPS: 5.0000 mg | ORAL_CAPSULE | Freq: Every day | ORAL | 0 refills | Status: DC

## 2023-10-23 NOTE — Telephone Encounter (Signed)
Chart reviewed. Revlimid refilled per last office note with Dr. Ellin Saba.

## 2023-10-24 LAB — PROTEIN ELECTROPHORESIS, SERUM
A/G Ratio: 1.1 (ref 0.7–1.7)
Albumin ELP: 3.5 g/dL (ref 2.9–4.4)
Alpha-1-Globulin: 0.3 g/dL (ref 0.0–0.4)
Alpha-2-Globulin: 0.8 g/dL (ref 0.4–1.0)
Beta Globulin: 1.1 g/dL (ref 0.7–1.3)
Gamma Globulin: 1.1 g/dL (ref 0.4–1.8)
Globulin, Total: 3.3 g/dL (ref 2.2–3.9)
Total Protein ELP: 6.8 g/dL (ref 6.0–8.5)

## 2023-10-27 LAB — IMMUNOFIXATION ELECTROPHORESIS
IgA: 244 mg/dL (ref 87–352)
IgG (Immunoglobin G), Serum: 1236 mg/dL (ref 586–1602)
IgM (Immunoglobulin M), Srm: 31 mg/dL (ref 26–217)
Total Protein ELP: 6.9 g/dL (ref 6.0–8.5)

## 2023-10-29 ENCOUNTER — Inpatient Hospital Stay: Payer: Medicare HMO

## 2023-10-29 ENCOUNTER — Inpatient Hospital Stay (HOSPITAL_BASED_OUTPATIENT_CLINIC_OR_DEPARTMENT_OTHER): Payer: Medicare HMO | Admitting: Hematology

## 2023-10-29 VITALS — BP 154/88 | HR 68 | Temp 97.7°F | Resp 16 | Wt 164.5 lb

## 2023-10-29 DIAGNOSIS — C9000 Multiple myeloma not having achieved remission: Secondary | ICD-10-CM | POA: Diagnosis not present

## 2023-10-29 DIAGNOSIS — Z23 Encounter for immunization: Secondary | ICD-10-CM | POA: Diagnosis not present

## 2023-10-29 DIAGNOSIS — C9001 Multiple myeloma in remission: Secondary | ICD-10-CM

## 2023-10-29 DIAGNOSIS — D508 Other iron deficiency anemias: Secondary | ICD-10-CM

## 2023-10-29 DIAGNOSIS — R197 Diarrhea, unspecified: Secondary | ICD-10-CM | POA: Diagnosis not present

## 2023-10-29 DIAGNOSIS — E876 Hypokalemia: Secondary | ICD-10-CM | POA: Diagnosis not present

## 2023-10-29 DIAGNOSIS — R5383 Other fatigue: Secondary | ICD-10-CM | POA: Diagnosis not present

## 2023-10-29 DIAGNOSIS — M4854XA Collapsed vertebra, not elsewhere classified, thoracic region, initial encounter for fracture: Secondary | ICD-10-CM | POA: Diagnosis not present

## 2023-10-29 DIAGNOSIS — M549 Dorsalgia, unspecified: Secondary | ICD-10-CM | POA: Diagnosis not present

## 2023-10-29 DIAGNOSIS — E611 Iron deficiency: Secondary | ICD-10-CM | POA: Diagnosis not present

## 2023-10-29 MED ORDER — INFLUENZA VAC A&B SURF ANT ADJ 0.5 ML IM SUSY
0.5000 mL | PREFILLED_SYRINGE | Freq: Once | INTRAMUSCULAR | Status: AC
  Administered 2023-10-29: 0.5 mL via INTRAMUSCULAR

## 2023-10-29 NOTE — Progress Notes (Signed)
Patient is taking Revlimid as prescribed.  She has not missed any doses and reports no side effects at this time.   

## 2023-10-29 NOTE — Patient Instructions (Signed)
 [facility] at [facility] Discharge Instructions   You were seen and examined today by Dr. Ellin Saba.  He reviewed the results of your lab work which are normal/stable.   Continue Revlimid as prescribed.   We will see you back in .   Return as scheduled.    Thank you for choosing [facility] at [facility] to provide your oncology and hematology care.  To afford each patient quality time with our provider, please arrive at least 15 minutes before your scheduled appointment time.   If you have a lab appointment with the Cancer Center please come in thru the Main Entrance and check in at the main information desk.  You need to re-schedule your appointment should you arrive 10 or more minutes late.  We strive to give you quality time with our providers, and arriving late affects you and other patients whose appointments are after yours.  Also, if you no show three or more times for appointments you may be dismissed from the clinic at the providers discretion.     Again, thank you for choosing [facility].  Our hope is that these requests will decrease the amount of time that you wait before being seen by our physicians.       _____________________________________________________________  Should you have questions after your visit to [facility], please contact our office at [phone] and follow the prompts.  Our office hours are 8:00 a.m. and 4:30 p.m. Monday - Friday.  Please note that voicemails left after 4:00 p.m. may not be returned until the following business day.  We are closed weekends and major holidays.  You do have access to a nurse 24-7, just call the main number to the clinic [phone] and do not press any options, hold on the line and a nurse will answer the phone.    For prescription refill requests, have your pharmacy contact our office and allow 72 hours.    Due to Covid, you will need to wear a  mask upon entering the hospital. If you do not have a mask, a mask will be given to you at the Main Entrance upon arrival. For doctor visits, patients may have 1 support person age 74 or older with them. For treatment visits, patients can not have anyone with them due to social distancing guidelines and our immunocompromised population.

## 2023-10-29 NOTE — Progress Notes (Unsigned)
 Stephanie Galvan presents today for injection per the provider's orders.  Fluad administration without incident; injection site WNL; see MAR for injection details.  Patient tolerated procedure well and without incident.  No questions or complaints noted at this time.

## 2023-10-29 NOTE — Progress Notes (Signed)
 Stephanie Galvan [address], [place] [address]    Clinic Day:  10/29/23   Referring physician: Lupita Raider, NP  Patient Care Team: Stephanie Raider, NP as PCP - General (Family Medicine) Stephanie Massed, MD as Consulting Physician (Hematology) Stephanie Candle, MD as Referring Physician (Internal Medicine)   ASSESSMENT & PLAN:   Assessment: 1. 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  Revlimid started on 10/06/2019 due to delay. -Velcade held since 12/15/2019 due to gastric symptoms, which have resolved completely. -She was evaluated by Stephanie Galvan at [facility].  Recommended to have immediate bone marrow transplant. -She has compression fractures of T8, T10 and T12.  Also L1 and L3 compression fractures. -Revlimid and dexamethasone continued until 03/22/2020. 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 -Revlimid 10 mg daily started around first week of December 2021. - Revlimid dose decreased to 10 mg 3 weeks on 1 week off on 01/04/2021 due to worsening back pain.  Revlimid dose decreased to 5 mg 3 weeks on/1 week off on 04/16/2023 due to decreased energy levels and diarrhea.   2.  Upper/mid back pain: -MRI on 07/13/2020 shows T7, T8, T12, L1 and L2 compression/superior endplate fractures.  Height loss is greatest and moderate at T7 and T8.  No evidence of bone lesions. - She was previously evaluated by IR for vertebroplasty which was not recommended.   Plan:  1.  IgA lambda plasma cell myeloma: - She is tolerating Revlimid reasonably well.  She reports some tiredness.  She also has diarrhea on and off. - Reviewed labs from 10/22/2023: Creatinine and calcium are normal.  M spike is negative.  FLC ratio is 1.47.  Immunofixation is unremarkable. - MRI of the thoracic and lumbar spine on 07/31/2023 showed new mild superior endplate compression deformity at T12  and nonspecific contrast-enhancing lesion in the inferior facet at T11 and L3 on the right.  New mild contrast-enhancement at the superior endplate at T11 thought to be degenerative. - She reported improvement in back pain.  For her fatigue, she was told to start walking once the weather gets better. - She will receive flu shot today.  Continue Revlimid 5 mg 3 weeks on/1 week off.  RTC 12 weeks for follow-up with repeat myeloma labs.  2.  Bone protection: - Last dose of Zometa on 12/12/2022.  Will start back Zometa if there is any recurrence of myeloma.  3.  Hypokalemia/hypomagnesemia: - Continue potassium liquid twice daily.  Continue magnesium every other day.  Potassium was 3.2 and magnesium 1.8.  4.  Iron deficiency state: - Received INFeD on 08/01/2023. - Ferritin today improved to 242 from 15.  No indication for parenteral iron therapy.   Orders Placed This Encounter  Procedures   CBC with Differential    Standing Status:   Standing    Number of Occurrences:   10    Expiration Date:   10/28/2024   Comprehensive metabolic panel    Standing Status:   Standing    Number of Occurrences:   10    Expiration Date:   10/28/2024   Kappa/lambda light chains    Standing Status:   Standing    Number of Occurrences:   10    Expiration Date:   10/28/2024   Immunofixation electrophoresis    Standing Status:   Standing    Number of Occurrences:   10  Expiration Date:   10/28/2024   Protein electrophoresis, serum    Standing Status:   Standing    Number of Occurrences:   10    Expiration Date:   10/28/2024   Iron and TIBC ([facility] ONLY)    Standing Status:   Future    Expected Date:   01/20/2024    Expiration Date:   10/28/2024   Ferritin    Standing Status:   Future    Expected Date:   01/20/2024    Expiration Date:   10/28/2024      Stephanie Galvan,acting as a scribe for Stephanie Massed, MD.,have documented all relevant documentation on the behalf of Stephanie Massed, MD,as directed by  Stephanie Massed, MD while in the presence of Stephanie Massed, MD.  I, Stephanie Massed MD, have reviewed the above documentation for accuracy and completeness, and I agree with the above.     Stephanie Massed, MD   2/18/20254:14 PM  CHIEF COMPLAINT:   Diagnosis: Multiple myeloma   Cancer Staging  Multiple myeloma (HCC) Staging form: Plasma Cell Myeloma and Plasma Cell Disorders, AJCC 8th Edition - Clinical stage from 08/22/2019: RISS Stage II (Beta-2-microglobulin (mg/L): 3.8, Albumin (g/dL): 3.2, ISS: Stage II, High-risk cytogenetics: Absent, LDH: Normal) - Signed by Stephanie Massed, MD on 08/22/2019    Prior Therapy: 1. RVD x 6 cycles from 08/19/2019 to 12/15/2019. 2. 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  Current Therapy: Revlimid 10 mg daily; Zometa every 12 weeks    HISTORY OF PRESENT ILLNESS:   Oncology History  Multiple myeloma (HCC)  08/22/2019 Initial Diagnosis   Multiple myeloma (HCC)   08/22/2019 Cancer Staging   Staging form: Plasma Cell Myeloma and Plasma Cell Disorders, AJCC 8th Edition - Clinical stage from 08/22/2019: RISS Stage II (Beta-2-microglobulin (mg/L): 3.8, Albumin (g/dL): 3.2, ISS: Stage II, High-risk cytogenetics: Absent, LDH: Normal) - Signed by Stephanie Massed, MD on 08/22/2019      INTERVAL HISTORY:   Stephanie Galvan is a 68 y.o. female presenting to clinic today for follow up of multiple myeloma. She was last seen by me on 07/23/23.  Today, she states that she is doing well overall. Her appetite level is at 100%. Her energy level is at 75%. She is tolerating Revlimid well. Stephanie Galvan reports tiredness as well as on and off diarrhea. Diarrhea is improved with 1 Questran packet 1-2 times a week. Back pain is improved from last visit, and feels more like pressure than pain today. Stephanie Galvan did not get her flu vaccination this season and would like to have one today. She is taking liquid potassium 15 mL BID.   Her  iron infusion from 08/01/23 was well tolerated and slightly improved her energy levels.   PAST MEDICAL HISTORY:   Past Medical History: Past Medical History:  Diagnosis Date   Breast cancer (HCC) 2006   left breast   Breast mass, right    Compression fracture of cervical spine (HCC) 06/18/2019   recent Chest xray stating had fx.   High cholesterol    History of bladder infections    Hypertension    Multiple myeloma (HCC) 2021   bone marrow transplant 2021   Rash 04/2012    Surgical History: Past Surgical History:  Procedure Laterality Date   ABDOMINAL HYSTERECTOMY     just uterus removed   BREAST BIOPSY     BREAST LUMPECTOMY WITH RADIOACTIVE SEED LOCALIZATION Right 07/24/2018   Procedure: RIGHT BREAST LUMPECTOMY WITH RADIOACTIVE SEED LOCALIZATION;  Surgeon: Abigail Miyamoto, MD;  Location:  [facility];  Service: General;  Laterality: Right;   IR RADIOLOGIST EVAL & MGMT  07/28/2020   MASTECTOMY  5/06   left   PARTIAL HYSTERECTOMY     PORT-A-CATH REMOVAL     PORTACATH PLACEMENT     THYROGLOSSAL DUCT CYST N/A 03/11/2023   Procedure: EXCISION OF THYROGLOSSAL DUCT CYST;  Surgeon: Serena Colonel, MD;  Location: [facility] OR;  Service: ENT;  Laterality: N/A;    Social History: Social History   Socioeconomic History   Marital status: Married    Spouse name: Not on file   Number of children: 2   Years of education: Not on file   Highest education level: Not on file  Occupational History    Employer: [employer]  Tobacco Use   Smoking status: Never   Smokeless tobacco: Never  Vaping Use   Vaping status: Never Used  Substance and Sexual Activity   Alcohol use: No   Drug use: No   Sexual activity: Yes    Birth control/protection: Surgical  Other Topics Concern   Not on file  Social History Narrative   Not on file   Social Drivers of Health   Financial Resource Strain: Low Risk  (07/20/2020)   Overall Financial Resource Strain (CARDIA)    Difficulty of  Paying Living Expenses: Not hard at all  Food Insecurity: Low Risk  (03/21/2023)   Received from [facility]   Hunger Vital Sign    Worried About Running Out of Food in the Last Year: Never true    Ran Out of Food in the Last Year: Never true  Transportation Needs: Not on file (03/21/2023)  Physical Activity: Inactive (07/20/2020)   Exercise Vital Sign    Days of Exercise per Week: 0 days    Minutes of Exercise per Session: 0 min  Stress: No Stress Concern Present (07/20/2020)   [employer] of Occupational Health - Occupational Stress Questionnaire    Feeling of Stress : Not at all  Social Connections: Moderately Integrated (07/20/2020)   Social Connection and Isolation Panel [NHANES]    Frequency of Communication with Friends and Family: More than three times a week    Frequency of Social Gatherings with Friends and Family: Never    Attends Religious Services: More than 4 times per year    Active Member of [employer] or Organizations: No    Attends Banker Meetings: Never    Marital Status: Married  Catering manager Violence: Not At Risk (07/20/2020)   Humiliation, Afraid, Rape, and Kick questionnaire    Fear of Current or Ex-Partner: No    Emotionally Abused: No    Physically Abused: No    Sexually Abused: No    Family History: Family History  Problem Relation Age of Onset   Dementia Mother    Stomach cancer Father    Cancer Brother    Cancer Brother    Diabetes Brother    Colitis Daughter    Hypertension Daughter    Hypertension Daughter     Current Medications:  Current Outpatient Medications:    aspirin EC 81 MG tablet, Take 81 mg by mouth daily. Swallow whole., Disp: , Rfl:    atenolol (TENORMIN) 25 MG tablet, Take 25 mg by mouth daily., Disp: , Rfl:    calcium carbonate (TUMS - DOSED IN MG ELEMENTAL CALCIUM) 500 MG chewable tablet, Chew 1 tablet by mouth 2 (two) times daily., Disp: , Rfl:    cholestyramine (QUESTRAN) 4 g packet, Take  1 packet (4 g  total) by mouth 3 (three) times daily with meals., Disp: 90 each, Rfl: 12   hydrochlorothiazide (HYDRODIURIL) 25 MG tablet, Take 25 mg by mouth daily., Disp: , Rfl:    lenalidomide (REVLIMID) 5 MG capsule, Take 1 capsule (5 mg total) by mouth daily. TAKE 1 CAPSULE BY MOUTH EVERY DAY FOR 3 WEEKS ON THEN 1 WEEK OFF FOR A 28 DAY SUPPLY, Disp: 21 capsule, Rfl: 0   Potassium Chloride 10 % SOLN, Take 20 mEq by mouth in the morning and at bedtime., Disp: 946 mL, Rfl: 3   Allergies: No Known Allergies  REVIEW OF SYSTEMS:   Review of Systems  Constitutional:  Negative for chills, fatigue and fever.  HENT:   Negative for lump/mass, mouth sores, nosebleeds, sore throat and trouble swallowing.   Eyes:  Negative for eye problems.  Respiratory:  Negative for cough and shortness of breath.   Cardiovascular:  Negative for chest pain, leg swelling and palpitations.  Gastrointestinal:  Positive for diarrhea. Negative for abdominal pain, constipation, nausea and vomiting.  Genitourinary:  Negative for bladder incontinence, difficulty urinating, dysuria, frequency, hematuria and nocturia.   Musculoskeletal:  Positive for back pain (3/10 severity). Negative for arthralgias, flank pain, myalgias and neck pain.  Skin:  Negative for itching and rash.  Neurological:  Positive for numbness (in feet). Negative for dizziness and headaches.  Hematological:  Does not bruise/bleed easily.  Psychiatric/Behavioral:  Negative for depression, sleep disturbance and suicidal ideas. The patient is not nervous/anxious.   All other systems reviewed and are negative.    VITALS:   Blood pressure (!) 154/88, pulse 68, temperature 97.7 F (36.5 C), temperature source Oral, resp. rate 16, weight 164 lb 7.4 oz (74.6 kg), SpO2 98%.  Wt Readings from Last 3 Encounters:  10/29/23 164 lb 7.4 oz (74.6 kg)  08/01/23 164 lb 6.4 oz (74.6 kg)  07/23/23 164 lb 8 oz (74.6 kg)    Body mass index is 27.37 kg/m.  Performance status  (ECOG): 1 - Symptomatic but completely ambulatory  PHYSICAL EXAM:   Physical Exam Vitals and nursing note reviewed. Exam conducted with a chaperone present.  Constitutional:      Appearance: Normal appearance.  Cardiovascular:     Rate and Rhythm: Normal rate and regular rhythm.     Pulses: Normal pulses.     Heart sounds: Normal heart sounds.  Pulmonary:     Effort: Pulmonary effort is normal.     Breath sounds: Normal breath sounds.  Abdominal:     Palpations: Abdomen is soft. There is no hepatomegaly, splenomegaly or mass.     Tenderness: There is no abdominal tenderness.  Musculoskeletal:     Right lower leg: No edema.     Left lower leg: No edema.  Lymphadenopathy:     Cervical: No cervical adenopathy.     Right cervical: No superficial, deep or posterior cervical adenopathy.    Left cervical: No superficial, deep or posterior cervical adenopathy.     Upper Body:     Right upper body: No supraclavicular or axillary adenopathy.     Left upper body: No supraclavicular or axillary adenopathy.  Neurological:     General: No focal deficit present.     Mental Status: She is alert and oriented to person, place, and time.  Psychiatric:        Mood and Affect: Mood normal.        Behavior: Behavior normal.     LABS:  Latest Ref Rng & Units 10/22/2023    2:30 PM 07/16/2023    3:08 PM 04/10/2023    1:56 PM  CBC  WBC 4.0 - 10.5 K/uL 3.6  3.3  2.9   Hemoglobin 12.0 - 15.0 g/dL 36.6  44.0  34.7   Hematocrit 36.0 - 46.0 % 39.5  39.5  35.4   Platelets 150 - 400 K/uL 206  211  181       Latest Ref Rng & Units 10/22/2023    2:30 PM 07/16/2023    3:08 PM 04/10/2023    1:56 PM  CMP  Glucose 70 - 99 mg/dL 83  92  85   BUN 8 - 23 mg/dL 19  21  16    Creatinine 0.44 - 1.00 mg/dL 4.25  9.56  3.87   Sodium 135 - 145 mmol/L 136  135  136   Potassium 3.5 - 5.1 mmol/L 3.2  3.5  3.4   Chloride 98 - 111 mmol/L 98  100  101   CO2 22 - 32 mmol/L 29  25  27    Calcium 8.9 - 10.3 mg/dL  9.6  9.2  8.8   Total Protein 6.5 - 8.1 g/dL 7.4  7.6  7.0   Total Bilirubin 0.0 - 1.2 mg/dL 0.8  0.5  0.5   Alkaline Phos 38 - 126 U/L 92  100  111   AST 15 - 41 U/L 33  20  21   ALT 0 - 44 U/L 29  20  31       No results found for: "CEA1", "CEA" / No results found for: "CEA1", "CEA" No results found for: "PSA1" No results found for: "CAN199" No results found for: "CAN125"  Lab Results  Component Value Date   TOTALPROTELP 6.8 10/22/2023   TOTALPROTELP 6.9 10/22/2023   ALBUMINELP 3.5 10/22/2023   A1GS 0.3 10/22/2023   A2GS 0.8 10/22/2023   BETS 1.1 10/22/2023   GAMS 1.1 10/22/2023   MSPIKE Not Observed 10/22/2023   SPEI Comment 10/22/2023   Lab Results  Component Value Date   TIBC 437 10/22/2023   TIBC 585 (H) 07/16/2023   FERRITIN 242 10/22/2023   FERRITIN 15 07/16/2023   IRONPCTSAT 22 10/22/2023   IRONPCTSAT 15 07/16/2023   Lab Results  Component Value Date   LDH 94 (L) 04/10/2023   LDH 98 02/26/2023   LDH 102 12/05/2022     STUDIES:   No results found.

## 2023-10-31 ENCOUNTER — Encounter (HOSPITAL_COMMUNITY): Payer: Self-pay | Admitting: Hematology

## 2023-11-14 DIAGNOSIS — I10 Essential (primary) hypertension: Secondary | ICD-10-CM | POA: Diagnosis not present

## 2023-11-14 DIAGNOSIS — E041 Nontoxic single thyroid nodule: Secondary | ICD-10-CM | POA: Diagnosis not present

## 2023-11-18 ENCOUNTER — Other Ambulatory Visit: Payer: Self-pay | Admitting: Hematology

## 2023-11-18 DIAGNOSIS — C9001 Multiple myeloma in remission: Secondary | ICD-10-CM

## 2023-11-19 ENCOUNTER — Encounter (HOSPITAL_COMMUNITY): Payer: Self-pay | Admitting: Hematology

## 2023-11-19 ENCOUNTER — Other Ambulatory Visit: Payer: Self-pay

## 2023-11-19 DIAGNOSIS — C9001 Multiple myeloma in remission: Secondary | ICD-10-CM

## 2023-11-19 MED ORDER — LENALIDOMIDE 5 MG PO CAPS: 5.0000 mg | ORAL_CAPSULE | Freq: Every day | ORAL | 0 refills | Status: DC

## 2023-11-19 NOTE — Telephone Encounter (Signed)
 Chart reviewed. Revlimid refilled per last office note with Dr. Ellin Saba.

## 2023-11-20 DIAGNOSIS — E041 Nontoxic single thyroid nodule: Secondary | ICD-10-CM | POA: Diagnosis not present

## 2023-11-20 DIAGNOSIS — D72819 Decreased white blood cell count, unspecified: Secondary | ICD-10-CM | POA: Diagnosis not present

## 2023-11-20 DIAGNOSIS — Z9481 Bone marrow transplant status: Secondary | ICD-10-CM | POA: Diagnosis not present

## 2023-11-20 DIAGNOSIS — E785 Hyperlipidemia, unspecified: Secondary | ICD-10-CM | POA: Diagnosis not present

## 2023-11-20 DIAGNOSIS — I10 Essential (primary) hypertension: Secondary | ICD-10-CM | POA: Diagnosis not present

## 2023-11-20 DIAGNOSIS — Z853 Personal history of malignant neoplasm of breast: Secondary | ICD-10-CM | POA: Diagnosis not present

## 2023-11-20 DIAGNOSIS — E876 Hypokalemia: Secondary | ICD-10-CM | POA: Diagnosis not present

## 2023-11-20 DIAGNOSIS — Z8579 Personal history of other malignant neoplasms of lymphoid, hematopoietic and related tissues: Secondary | ICD-10-CM | POA: Diagnosis not present

## 2023-11-25 DIAGNOSIS — R748 Abnormal levels of other serum enzymes: Secondary | ICD-10-CM | POA: Diagnosis not present

## 2023-11-25 DIAGNOSIS — R718 Other abnormality of red blood cells: Secondary | ICD-10-CM | POA: Diagnosis not present

## 2023-11-27 ENCOUNTER — Encounter (INDEPENDENT_AMBULATORY_CARE_PROVIDER_SITE_OTHER): Payer: Self-pay | Admitting: *Deleted

## 2023-12-11 ENCOUNTER — Other Ambulatory Visit: Payer: Self-pay | Admitting: Hematology

## 2023-12-11 DIAGNOSIS — C9001 Multiple myeloma in remission: Secondary | ICD-10-CM

## 2023-12-12 ENCOUNTER — Other Ambulatory Visit: Payer: Self-pay

## 2023-12-12 DIAGNOSIS — C9001 Multiple myeloma in remission: Secondary | ICD-10-CM

## 2023-12-12 MED ORDER — LENALIDOMIDE 5 MG PO CAPS: 5.0000 mg | ORAL_CAPSULE | Freq: Every day | ORAL | 0 refills | Status: DC

## 2023-12-12 NOTE — Telephone Encounter (Signed)
 Chart reviewed. Revlimid refilled per last office note with Dr. Ellin Saba.

## 2023-12-16 DIAGNOSIS — H2513 Age-related nuclear cataract, bilateral: Secondary | ICD-10-CM | POA: Diagnosis not present

## 2023-12-16 DIAGNOSIS — H0011 Chalazion right upper eyelid: Secondary | ICD-10-CM | POA: Diagnosis not present

## 2023-12-16 DIAGNOSIS — D23121 Other benign neoplasm of skin of left upper eyelid, including canthus: Secondary | ICD-10-CM | POA: Diagnosis not present

## 2023-12-16 DIAGNOSIS — H18413 Arcus senilis, bilateral: Secondary | ICD-10-CM | POA: Diagnosis not present

## 2023-12-21 ENCOUNTER — Other Ambulatory Visit: Payer: Self-pay | Admitting: Hematology

## 2023-12-23 ENCOUNTER — Encounter (HOSPITAL_COMMUNITY): Payer: Self-pay | Admitting: Hematology

## 2024-01-01 DIAGNOSIS — I10 Essential (primary) hypertension: Secondary | ICD-10-CM | POA: Diagnosis not present

## 2024-01-02 DIAGNOSIS — H0011 Chalazion right upper eyelid: Secondary | ICD-10-CM | POA: Diagnosis not present

## 2024-01-05 ENCOUNTER — Other Ambulatory Visit: Payer: Self-pay | Admitting: Hematology

## 2024-01-05 DIAGNOSIS — C9001 Multiple myeloma in remission: Secondary | ICD-10-CM

## 2024-01-06 ENCOUNTER — Other Ambulatory Visit: Payer: Self-pay | Admitting: Hematology

## 2024-01-06 DIAGNOSIS — C9001 Multiple myeloma in remission: Secondary | ICD-10-CM

## 2024-01-21 ENCOUNTER — Inpatient Hospital Stay: Payer: Medicare HMO

## 2024-01-22 ENCOUNTER — Inpatient Hospital Stay: Attending: Hematology

## 2024-01-22 DIAGNOSIS — R197 Diarrhea, unspecified: Secondary | ICD-10-CM | POA: Diagnosis not present

## 2024-01-22 DIAGNOSIS — D508 Other iron deficiency anemias: Secondary | ICD-10-CM

## 2024-01-22 DIAGNOSIS — K5900 Constipation, unspecified: Secondary | ICD-10-CM | POA: Insufficient documentation

## 2024-01-22 DIAGNOSIS — Z79899 Other long term (current) drug therapy: Secondary | ICD-10-CM | POA: Insufficient documentation

## 2024-01-22 DIAGNOSIS — Z809 Family history of malignant neoplasm, unspecified: Secondary | ICD-10-CM | POA: Insufficient documentation

## 2024-01-22 DIAGNOSIS — M549 Dorsalgia, unspecified: Secondary | ICD-10-CM | POA: Diagnosis not present

## 2024-01-22 DIAGNOSIS — Z833 Family history of diabetes mellitus: Secondary | ICD-10-CM | POA: Insufficient documentation

## 2024-01-22 DIAGNOSIS — E7800 Pure hypercholesterolemia, unspecified: Secondary | ICD-10-CM | POA: Insufficient documentation

## 2024-01-22 DIAGNOSIS — C9001 Multiple myeloma in remission: Secondary | ICD-10-CM

## 2024-01-22 DIAGNOSIS — C9000 Multiple myeloma not having achieved remission: Secondary | ICD-10-CM | POA: Insufficient documentation

## 2024-01-22 DIAGNOSIS — Z90710 Acquired absence of both cervix and uterus: Secondary | ICD-10-CM | POA: Diagnosis not present

## 2024-01-22 DIAGNOSIS — Z7982 Long term (current) use of aspirin: Secondary | ICD-10-CM | POA: Diagnosis not present

## 2024-01-22 DIAGNOSIS — Z8379 Family history of other diseases of the digestive system: Secondary | ICD-10-CM | POA: Diagnosis not present

## 2024-01-22 DIAGNOSIS — D72819 Decreased white blood cell count, unspecified: Secondary | ICD-10-CM | POA: Diagnosis not present

## 2024-01-22 DIAGNOSIS — E876 Hypokalemia: Secondary | ICD-10-CM | POA: Insufficient documentation

## 2024-01-22 DIAGNOSIS — Z8249 Family history of ischemic heart disease and other diseases of the circulatory system: Secondary | ICD-10-CM | POA: Diagnosis not present

## 2024-01-22 DIAGNOSIS — Z818 Family history of other mental and behavioral disorders: Secondary | ICD-10-CM | POA: Diagnosis not present

## 2024-01-22 LAB — CBC WITH DIFFERENTIAL/PLATELET
Abs Immature Granulocytes: 0.01 10*3/uL (ref 0.00–0.07)
Basophils Absolute: 0 10*3/uL (ref 0.0–0.1)
Basophils Relative: 1 %
Eosinophils Absolute: 0.1 10*3/uL (ref 0.0–0.5)
Eosinophils Relative: 4 %
HCT: 38.5 % (ref 36.0–46.0)
Hemoglobin: 12.8 g/dL (ref 12.0–15.0)
Immature Granulocytes: 0 %
Lymphocytes Relative: 36 %
Lymphs Abs: 1 10*3/uL (ref 0.7–4.0)
MCH: 31.9 pg (ref 26.0–34.0)
MCHC: 33.2 g/dL (ref 30.0–36.0)
MCV: 96 fL (ref 80.0–100.0)
Monocytes Absolute: 0.4 10*3/uL (ref 0.1–1.0)
Monocytes Relative: 14 %
Neutro Abs: 1.3 10*3/uL — ABNORMAL LOW (ref 1.7–7.7)
Neutrophils Relative %: 45 %
Platelets: 192 10*3/uL (ref 150–400)
RBC: 4.01 MIL/uL (ref 3.87–5.11)
RDW: 12.7 % (ref 11.5–15.5)
WBC: 2.9 10*3/uL — ABNORMAL LOW (ref 4.0–10.5)
nRBC: 0 % (ref 0.0–0.2)

## 2024-01-22 LAB — COMPREHENSIVE METABOLIC PANEL WITH GFR
ALT: 23 U/L (ref 0–44)
AST: 27 U/L (ref 15–41)
Albumin: 3.7 g/dL (ref 3.5–5.0)
Alkaline Phosphatase: 79 U/L (ref 38–126)
Anion gap: 9 (ref 5–15)
BUN: 12 mg/dL (ref 8–23)
CO2: 29 mmol/L (ref 22–32)
Calcium: 9.2 mg/dL (ref 8.9–10.3)
Chloride: 99 mmol/L (ref 98–111)
Creatinine, Ser: 0.97 mg/dL (ref 0.44–1.00)
GFR, Estimated: >60 mL/min (ref >60)
Glucose, Bld: 89 mg/dL (ref 70–99)
Potassium: 3.3 mmol/L — ABNORMAL LOW (ref 3.5–5.1)
Sodium: 137 mmol/L (ref 135–145)
Total Bilirubin: 0.5 mg/dL (ref 0.0–1.2)
Total Protein: 7.1 g/dL (ref 6.5–8.1)

## 2024-01-22 LAB — FERRITIN: Ferritin: 185 ng/mL (ref 11–307)

## 2024-01-22 LAB — IRON AND TIBC
Iron: 72 ug/dL (ref 28–170)
Saturation Ratios: 16 % (ref 10.4–31.8)
TIBC: 438 ug/dL (ref 250–450)
UIBC: 366 ug/dL

## 2024-01-23 LAB — KAPPA/LAMBDA LIGHT CHAINS
Kappa free light chain: 33.6 mg/L — ABNORMAL HIGH (ref 3.3–19.4)
Kappa, lambda light chain ratio: 1.31 (ref 0.26–1.65)
Lambda free light chains: 25.7 mg/L (ref 5.7–26.3)

## 2024-01-27 DIAGNOSIS — H0260 Xanthelasma of unspecified eye, unspecified eyelid: Secondary | ICD-10-CM | POA: Diagnosis not present

## 2024-01-27 DIAGNOSIS — D23121 Other benign neoplasm of skin of left upper eyelid, including canthus: Secondary | ICD-10-CM | POA: Diagnosis not present

## 2024-01-27 LAB — IMMUNOFIXATION ELECTROPHORESIS
IgA: 229 mg/dL (ref 87–352)
IgG (Immunoglobin G), Serum: 1225 mg/dL (ref 586–1602)
IgM (Immunoglobulin M), Srm: 29 mg/dL (ref 26–217)
Total Protein ELP: 6.9 g/dL (ref 6.0–8.5)

## 2024-01-27 LAB — PROTEIN ELECTROPHORESIS, SERUM
A/G Ratio: 1.1 (ref 0.7–1.7)
Albumin ELP: 3.6 g/dL (ref 2.9–4.4)
Alpha-1-Globulin: 0.3 g/dL (ref 0.0–0.4)
Alpha-2-Globulin: 0.8 g/dL (ref 0.4–1.0)
Beta Globulin: 1.1 g/dL (ref 0.7–1.3)
Gamma Globulin: 1.1 g/dL (ref 0.4–1.8)
Globulin, Total: 3.3 g/dL (ref 2.2–3.9)
Total Protein ELP: 6.9 g/dL (ref 6.0–8.5)

## 2024-01-28 ENCOUNTER — Inpatient Hospital Stay (HOSPITAL_BASED_OUTPATIENT_CLINIC_OR_DEPARTMENT_OTHER): Payer: Medicare HMO | Admitting: Hematology

## 2024-01-28 VITALS — BP 129/72 | HR 56 | Temp 98.0°F | Resp 18 | Wt 163.4 lb

## 2024-01-28 DIAGNOSIS — K5900 Constipation, unspecified: Secondary | ICD-10-CM | POA: Diagnosis not present

## 2024-01-28 DIAGNOSIS — D508 Other iron deficiency anemias: Secondary | ICD-10-CM

## 2024-01-28 DIAGNOSIS — D72819 Decreased white blood cell count, unspecified: Secondary | ICD-10-CM | POA: Diagnosis not present

## 2024-01-28 DIAGNOSIS — C9000 Multiple myeloma not having achieved remission: Secondary | ICD-10-CM | POA: Diagnosis not present

## 2024-01-28 DIAGNOSIS — Z7982 Long term (current) use of aspirin: Secondary | ICD-10-CM | POA: Diagnosis not present

## 2024-01-28 DIAGNOSIS — R197 Diarrhea, unspecified: Secondary | ICD-10-CM | POA: Diagnosis not present

## 2024-01-28 DIAGNOSIS — M549 Dorsalgia, unspecified: Secondary | ICD-10-CM | POA: Diagnosis not present

## 2024-01-28 DIAGNOSIS — E876 Hypokalemia: Secondary | ICD-10-CM | POA: Diagnosis not present

## 2024-01-28 DIAGNOSIS — C9001 Multiple myeloma in remission: Secondary | ICD-10-CM

## 2024-01-28 DIAGNOSIS — E7800 Pure hypercholesterolemia, unspecified: Secondary | ICD-10-CM | POA: Diagnosis not present

## 2024-01-28 MED ORDER — LENALIDOMIDE 5 MG PO CAPS
5.0000 mg | ORAL_CAPSULE | Freq: Every day | ORAL | 0 refills | Status: DC
Start: 2024-01-28 — End: 2024-02-05

## 2024-01-28 NOTE — Progress Notes (Signed)
 [facility] [address], [place] [address]    Clinic Day:  01/28/24   Referring physician: Wendi Ham, NP  Patient Care Team: Stephanie Ham, NP as PCP - General (Family Medicine) Stephanie Boros, MD as Consulting Physician (Hematology) Stephanie Greaser, MD as Referring Physician (Internal Medicine)   ASSESSMENT & PLAN:   Assessment: 1. 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  Revlimid  started on 10/06/2019 due to delay. -Velcade  held since 12/15/2019 due to gastric symptoms, which have resolved completely. -She was evaluated by Dr. Marjorie Galvan at [facility].  Recommended to have immediate bone marrow transplant. -She has compression fractures of T8, T10 and T12.  Also L1 and L3 compression fractures. -Revlimid  and dexamethasone  continued until 03/22/2020. 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 -Revlimid  10 mg daily started around first week of December 2021. - Revlimid  dose decreased to 10 mg 3 weeks on 1 week off on 01/04/2021 due to worsening back pain.  Revlimid  dose decreased to 5 mg 3 weeks on/1 week off on 04/16/2023 due to decreased energy levels and diarrhea.   2.  Upper/mid back pain: -MRI on 07/13/2020 shows T7, T8, T12, L1 and L2 compression/superior endplate fractures.  Height loss is greatest and moderate at T7 and T8.  No evidence of bone lesions. - She was previously evaluated by IR for vertebroplasty which was not recommended.   Plan:  1.  IgA lambda plasma cell myeloma: - She is tolerating Revlimid  very well. - She continues to have loose bowels.  She is using Questran  which is making her constipated.  She will also try Lomotil and see which one works better. - Reviewed myeloma labs from 01/22/2024: Normal LFTs.  M spike is not detected.  FLC ratio is normal.  Immunofixation was normal.  CBC grossly normal with mild leukopenia and  neutropenia. - No evidence of recurrence of multiple myeloma.  Continue Revlimid  maintenance 5 mg 3 weeks on/1 week off.  RTC 12 weeks for follow-up with repeat myeloma labs.   2.  Bone protection: - Zometa  was continued until 12/12/2022.  Will start back Zometa  if there is any recurrence of multiple myeloma.  3.  Hypokalemia/hypomagnesemia: - Continue potassium liquid twice daily and magnesium  every other day.  Potassium is 3.3.  4.  Iron  deficiency state: - Last INFeD  in November 2024.  Current ferritin is 185 and saturation 16 with hemoglobin normal at 12.8.  No indication for parenteral iron  therapy.   Orders Placed This Encounter  Procedures   CBC with Differential    Standing Status:   Future    Expected Date:   04/27/2024    Expiration Date:   01/27/2025   Comprehensive metabolic panel    Standing Status:   Future    Expected Date:   04/27/2024    Expiration Date:   01/27/2025   Kappa/lambda light chains    Standing Status:   Future    Expected Date:   04/27/2024    Expiration Date:   01/27/2025   Immunofixation electrophoresis    Standing Status:   Future    Expected Date:   04/27/2024    Expiration Date:   01/27/2025   Protein electrophoresis, serum    Standing Status:   Future    Expected Date:   04/27/2024    Expiration Date:   01/27/2025   Iron  and TIBC ([facility] ONLY)  Standing Status:   Future    Expected Date:   04/27/2024    Expiration Date:   01/27/2025   Ferritin    Standing Status:   Future    Expected Date:   04/27/2024    Expiration Date:   01/27/2025      Stephanie Galvan,acting as a scribe for Stephanie Boros, MD.,have documented all relevant documentation on the behalf of Stephanie Boros, MD,as directed by  Stephanie Boros, MD while in the presence of Stephanie Boros, MD.  I, Stephanie Boros MD, have reviewed the above documentation for accuracy and completeness, and I agree with the above.      Stephanie Boros, MD   5/20/20254:23 PM  CHIEF COMPLAINT:   Diagnosis: Multiple myeloma   Cancer Staging  Multiple myeloma (HCC) Staging form: Plasma Cell Myeloma and Plasma Cell Disorders, AJCC 8th Edition - Clinical stage from 08/22/2019: RISS Stage II (Beta-2 -microglobulin (mg/L): 3.8, Albumin (g/dL): 3.2, ISS: Stage II, High-risk cytogenetics: Absent, LDH: Normal) - Signed by Stephanie Boros, MD on 08/22/2019    Prior Therapy: 1. RVD x 6 cycles from 08/19/2019 to 12/15/2019. 2. 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  Current Therapy: Revlimid  10 mg daily; Zometa  every 12 weeks    HISTORY OF PRESENT ILLNESS:   Oncology History  Multiple myeloma (HCC)  08/22/2019 Initial Diagnosis   Multiple myeloma (HCC)   08/22/2019 Cancer Staging   Staging form: Plasma Cell Myeloma and Plasma Cell Disorders, AJCC 8th Edition - Clinical stage from 08/22/2019: RISS Stage II (Beta-2 -microglobulin (mg/L): 3.8, Albumin (g/dL): 3.2, ISS: Stage II, High-risk cytogenetics: Absent, LDH: Normal) - Signed by Stephanie Boros, MD on 08/22/2019      INTERVAL HISTORY:   Stephanie Galvan is a 68 y.o. female presenting to clinic today for follow up of multiple myeloma. She was last seen by me on 10/29/23.  Today, she states that she is doing well overall. Her appetite level is at 100%. Her energy level is at 75%.   PAST MEDICAL HISTORY:   Past Medical History: Past Medical History:  Diagnosis Date   Breast cancer (HCC) 2006   left breast   Breast mass, right    Compression fracture of cervical spine (HCC) 06/18/2019   recent Chest xray stating had fx.   High cholesterol    History of bladder infections    Hypertension    Multiple myeloma (HCC) 2021   bone marrow transplant 2021   Rash 04/2012    Surgical History: Past Surgical History:  Procedure Laterality Date   ABDOMINAL HYSTERECTOMY     just uterus removed   BREAST BIOPSY     BREAST LUMPECTOMY WITH RADIOACTIVE SEED LOCALIZATION Right 07/24/2018    Procedure: RIGHT BREAST LUMPECTOMY WITH RADIOACTIVE SEED LOCALIZATION;  Surgeon: Stephanie Blumenthal, MD;  Location: [facility];  Service: General;  Laterality: Right;   IR RADIOLOGIST EVAL & MGMT  07/28/2020   MASTECTOMY  5/06   left   PARTIAL HYSTERECTOMY     PORT-A-CATH REMOVAL     PORTACATH PLACEMENT     THYROGLOSSAL DUCT CYST N/A 03/11/2023   Procedure: EXCISION OF THYROGLOSSAL DUCT CYST;  Surgeon: Janita Mellow, MD;  Location: [facility] OR;  Service: ENT;  Laterality: N/A;    Social History: Social History   Socioeconomic History   Marital status: Married    Spouse name: Not on file   Number of children: 2   Years of education: Not on file   Highest education level: Not on file  Occupational History  Employer: [employer]  Tobacco Use   Smoking status: Never   Smokeless tobacco: Never  Vaping Use   Vaping status: Never Used  Substance and Sexual Activity   Alcohol use: No   Drug use: No   Sexual activity: Yes    Birth control/protection: Surgical  Other Topics Concern   Not on file  Social History Narrative   Not on file   Social Drivers of Health   Financial Resource Strain: Low Risk  (07/20/2020)   Overall Financial Resource Strain (CARDIA)    Difficulty of Paying Living Expenses: Not hard at all  Food Insecurity: Low Risk  (03/21/2023)   Received from [facility]   Hunger Vital Sign    Worried About Running Out of Food in the Last Year: Never true    Ran Out of Food in the Last Year: Never true  Transportation Needs: Not on file (03/21/2023)  Physical Activity: Inactive (07/20/2020)   Exercise Vital Sign    Days of Exercise per Week: 0 days    Minutes of Exercise per Session: 0 min  Stress: No Stress Concern Present (07/20/2020)   [employer] of Occupational Health - Occupational Stress Questionnaire    Feeling of Stress : Not at all  Social Connections: Moderately Integrated (07/20/2020)   Social Connection and Isolation Panel [NHANES]     Frequency of Communication with Friends and Family: More than three times a week    Frequency of Social Gatherings with Friends and Family: Never    Attends Religious Services: More than 4 times per year    Active Member of [employer] or Organizations: No    Attends Banker Meetings: Never    Marital Status: Married  Catering manager Violence: Not At Risk (07/20/2020)   Humiliation, Afraid, Rape, and Kick questionnaire    Fear of Current or Ex-Partner: No    Emotionally Abused: No    Physically Abused: No    Sexually Abused: No    Family History: Family History  Problem Relation Age of Onset   Dementia Mother    Stomach cancer Father    Cancer Brother    Cancer Brother    Diabetes Brother    Colitis Daughter    Hypertension Daughter    Hypertension Daughter     Current Medications:  Current Outpatient Medications:    aspirin  EC 81 MG tablet, Take 81 mg by mouth daily. Swallow whole., Disp: , Rfl:    atenolol  (TENORMIN ) 25 MG tablet, Take 25 mg by mouth daily., Disp: , Rfl:    calcium carbonate (TUMS - DOSED IN MG ELEMENTAL CALCIUM) 500 MG chewable tablet, Chew 1 tablet by mouth 2 (two) times daily., Disp: , Rfl:    cholestyramine  (QUESTRAN ) 4 g packet, Take 1 packet (4 g total) by mouth 3 (three) times daily with meals., Disp: 90 each, Rfl: 12   hydrochlorothiazide  (HYDRODIURIL ) 25 MG tablet, Take 25 mg by mouth daily., Disp: , Rfl:    potassium chloride  20 MEQ/15ML (10%) SOLN, MIX 15 ML IN 4 OUNCES OF LIQUID AND DRINK IN THE MORNING AND AT BEDTIME., Disp: 473 mL, Rfl: 3   lenalidomide  (REVLIMID ) 5 MG capsule, Take 1 capsule (5 mg total) by mouth daily. Take 3 weeks on and one week off every 28 days, Disp: 21 capsule, Rfl: 0   Allergies: No Known Allergies  REVIEW OF SYSTEMS:   Review of Systems  Constitutional:  Negative for chills, fatigue and fever.  HENT:   Negative for lump/mass,  mouth sores, nosebleeds, sore throat and trouble swallowing.   Eyes:   Negative for eye problems.  Respiratory:  Negative for cough and shortness of breath.   Cardiovascular:  Negative for chest pain, leg swelling and palpitations.  Gastrointestinal:  Positive for diarrhea. Negative for abdominal pain, constipation, nausea and vomiting.  Genitourinary:  Negative for bladder incontinence, difficulty urinating, dysuria, frequency, hematuria and nocturia.   Musculoskeletal:  Negative for arthralgias, back pain, flank pain, myalgias and neck pain.  Skin:  Negative for itching and rash.  Neurological:  Positive for numbness. Negative for dizziness and headaches.  Hematological:  Does not bruise/bleed easily.  Psychiatric/Behavioral:  Negative for depression, sleep disturbance and suicidal ideas. The patient is not nervous/anxious.   All other systems reviewed and are negative.    VITALS:   Blood pressure 129/72, pulse (!) 56, temperature 98 F (36.7 C), resp. rate 18, weight 163 lb 6.4 oz (74.1 kg), SpO2 100%.  Wt Readings from Last 3 Encounters:  01/28/24 163 lb 6.4 oz (74.1 kg)  10/29/23 164 lb 7.4 oz (74.6 kg)  08/01/23 164 lb 6.4 oz (74.6 kg)    Body mass index is 27.19 kg/m.  Performance status (ECOG): 1 - Symptomatic but completely ambulatory  PHYSICAL EXAM:   Physical Exam Vitals and nursing note reviewed. Exam conducted with a chaperone present.  Constitutional:      Appearance: Normal appearance.  Cardiovascular:     Rate and Rhythm: Normal rate and regular rhythm.     Pulses: Normal pulses.     Heart sounds: Normal heart sounds.  Pulmonary:     Effort: Pulmonary effort is normal.     Breath sounds: Normal breath sounds.  Abdominal:     Palpations: Abdomen is soft. There is no hepatomegaly, splenomegaly or mass.     Tenderness: There is no abdominal tenderness.  Musculoskeletal:     Right lower leg: No edema.     Left lower leg: No edema.  Lymphadenopathy:     Cervical: No cervical adenopathy.     Right cervical: No superficial, deep  or posterior cervical adenopathy.    Left cervical: No superficial, deep or posterior cervical adenopathy.     Upper Body:     Right upper body: No supraclavicular or axillary adenopathy.     Left upper body: No supraclavicular or axillary adenopathy.  Neurological:     General: No focal deficit present.     Mental Status: She is alert and oriented to person, place, and time.  Psychiatric:        Mood and Affect: Mood normal.        Behavior: Behavior normal.     LABS:      Latest Ref Rng & Units 01/22/2024    1:03 PM 10/22/2023    2:30 PM 07/16/2023    3:08 PM  CBC  WBC 4.0 - 10.5 K/uL 2.9  3.6  3.3   Hemoglobin 12.0 - 15.0 g/dL 87.5  64.3  32.9   Hematocrit 36.0 - 46.0 % 38.5  39.5  39.5   Platelets 150 - 400 K/uL 192  206  211       Latest Ref Rng & Units 01/22/2024    1:03 PM 10/22/2023    2:30 PM 07/16/2023    3:08 PM  CMP  Glucose 70 - 99 mg/dL 89  83  92   BUN 8 - 23 mg/dL 12  19  21    Creatinine 0.44 - 1.00 mg/dL 5.18  8.41  1.04   Sodium 135 - 145 mmol/L 137  136  135   Potassium 3.5 - 5.1 mmol/L 3.3  3.2  3.5   Chloride 98 - 111 mmol/L 99  98  100   CO2 22 - 32 mmol/L 29  29  25    Calcium 8.9 - 10.3 mg/dL 9.2  9.6  9.2   Total Protein 6.5 - 8.1 g/dL 7.1  7.4  7.6   Total Bilirubin 0.0 - 1.2 mg/dL 0.5  0.8  0.5   Alkaline Phos 38 - 126 U/L 79  92  100   AST 15 - 41 U/L 27  33  20   ALT 0 - 44 U/L 23  29  20       No results found for: "CEA1", "CEA" / No results found for: "CEA1", "CEA" No results found for: "PSA1" No results found for: "CAN199" No results found for: "CAN125"  Lab Results  Component Value Date   TOTALPROTELP 6.9 01/22/2024   ALBUMINELP 3.6 01/22/2024   A1GS 0.3 01/22/2024   A2GS 0.8 01/22/2024   BETS 1.1 01/22/2024   GAMS 1.1 01/22/2024   MSPIKE Not Observed 01/22/2024   SPEI Comment 01/22/2024   Lab Results  Component Value Date   TIBC 438 01/22/2024   TIBC 437 10/22/2023   TIBC 585 (H) 07/16/2023   FERRITIN 185 01/22/2024    FERRITIN 242 10/22/2023   FERRITIN 15 07/16/2023   IRONPCTSAT 16 01/22/2024   IRONPCTSAT 22 10/22/2023   IRONPCTSAT 15 07/16/2023   Lab Results  Component Value Date   LDH 94 (L) 04/10/2023   LDH 98 02/26/2023   LDH 102 12/05/2022     STUDIES:   No results found.

## 2024-01-28 NOTE — Patient Instructions (Addendum)
 [facility] at [facility] Discharge Instructions   You were seen and examined today by Dr. Cheree Cords.  He reviewed the results of your lab work which are normal/stable.   Continue Revlimid  as prescribed.   We will see you back in 3 months. We will repeat lab work prior to this visit.    Return as scheduled.    Thank you for choosing [facility] at [facility] to provide your oncology and hematology care.  To afford each patient quality time with our provider, please arrive at least 15 minutes before your scheduled appointment time.   If you have a lab appointment with the Cancer Center please come in thru the Main Entrance and check in at the main information desk.  You need to re-schedule your appointment should you arrive 10 or more minutes late.  We strive to give you quality time with our providers, and arriving late affects you and other patients whose appointments are after yours.  Also, if you no show three or more times for appointments you may be dismissed from the clinic at the providers discretion.     Again, thank you for choosing [facility].  Our hope is that these requests will decrease the amount of time that you wait before being seen by our physicians.       _____________________________________________________________  Should you have questions after your visit to [facility], please contact our office at [phone] and follow the prompts.  Our office hours are 8:00 a.m. and 4:30 p.m. Monday - Friday.  Please note that voicemails left after 4:00 p.m. may not be returned until the following business day.  We are closed weekends and major holidays.  You do have access to a nurse 24-7, just call the main number to the clinic [phone] and do not press any options, hold on the line and a nurse will answer the phone.    For prescription refill requests, have your pharmacy contact our office and allow  72 hours.    Due to Covid, you will need to wear a mask upon entering the hospital. If you do not have a mask, a mask will be given to you at the Main Entrance upon arrival. For doctor visits, patients may have 1 support person age 58 or older with them. For treatment visits, patients can not have anyone with them due to social distancing guidelines and our immunocompromised population.

## 2024-02-03 ENCOUNTER — Other Ambulatory Visit: Payer: Self-pay | Admitting: Hematology

## 2024-02-03 DIAGNOSIS — C9001 Multiple myeloma in remission: Secondary | ICD-10-CM

## 2024-02-05 ENCOUNTER — Encounter (HOSPITAL_COMMUNITY): Payer: Self-pay | Admitting: Hematology

## 2024-02-15 ENCOUNTER — Other Ambulatory Visit: Payer: Self-pay | Admitting: Hematology

## 2024-02-17 ENCOUNTER — Encounter (HOSPITAL_COMMUNITY): Payer: Self-pay | Admitting: Hematology

## 2024-02-17 ENCOUNTER — Other Ambulatory Visit (HOSPITAL_COMMUNITY)
Admission: RE | Admit: 2024-02-17 | Discharge: 2024-02-17 | Disposition: A | Discharge status: Home / Self Care | Source: Ambulatory Visit | Attending: Internal Medicine | Admitting: Internal Medicine

## 2024-02-17 DIAGNOSIS — C9000 Multiple myeloma not having achieved remission: Secondary | ICD-10-CM | POA: Diagnosis not present

## 2024-02-26 DIAGNOSIS — Z9481 Bone marrow transplant status: Secondary | ICD-10-CM | POA: Diagnosis not present

## 2024-02-26 DIAGNOSIS — C9000 Multiple myeloma not having achieved remission: Secondary | ICD-10-CM | POA: Diagnosis not present

## 2024-02-26 DIAGNOSIS — C9001 Multiple myeloma in remission: Secondary | ICD-10-CM | POA: Diagnosis not present

## 2024-03-02 ENCOUNTER — Other Ambulatory Visit: Payer: Self-pay | Admitting: Hematology

## 2024-03-02 DIAGNOSIS — C9001 Multiple myeloma in remission: Secondary | ICD-10-CM

## 2024-03-03 ENCOUNTER — Encounter (HOSPITAL_COMMUNITY): Payer: Self-pay | Admitting: Hematology

## 2024-03-05 ENCOUNTER — Other Ambulatory Visit: Payer: Self-pay | Admitting: *Deleted

## 2024-03-05 DIAGNOSIS — C9001 Multiple myeloma in remission: Secondary | ICD-10-CM

## 2024-03-05 MED ORDER — LENALIDOMIDE 5 MG PO CAPS: 5.0000 mg | ORAL_CAPSULE | Freq: Every day | ORAL | 0 refills | Status: DC

## 2024-03-16 ENCOUNTER — Telehealth: Payer: Self-pay | Admitting: *Deleted

## 2024-03-16 NOTE — Telephone Encounter (Signed)
 Received call from [facility] wanting to clarify wether or not patient was to be taking Revlimid  and at what dose, as patient advised the medication was on hold for now.  Per Dr. Katragadda, patient is still to be on therapy, however had been at a reduced dose previously secondary to severe diarrhea.  Spoke to patient and she states that she recalls Dr. Guinevere, oncologist at [facility] telling her she could stop therapy at this point.  Per note in Care Everywhere, there is a discrepancy as to if she is to be off of therapy.  Advised her to make a follow up call to their office and make us  aware of the outcome.  She will follow up in our office late August.

## 2024-03-23 ENCOUNTER — Other Ambulatory Visit: Payer: Self-pay | Admitting: *Deleted

## 2024-03-23 DIAGNOSIS — C9001 Multiple myeloma in remission: Secondary | ICD-10-CM

## 2024-04-06 ENCOUNTER — Other Ambulatory Visit: Payer: Self-pay

## 2024-04-06 DIAGNOSIS — C9001 Multiple myeloma in remission: Secondary | ICD-10-CM

## 2024-04-06 MED ORDER — LENALIDOMIDE 5 MG PO CAPS: 5.0000 mg | ORAL_CAPSULE | Freq: Every day | ORAL | 0 refills | Status: DC

## 2024-04-06 NOTE — Telephone Encounter (Signed)
 Chart reviewed. Revlimid refilled per last office note with Dr. Ellin Saba.

## 2024-04-07 DIAGNOSIS — B029 Zoster without complications: Secondary | ICD-10-CM | POA: Diagnosis not present

## 2024-04-07 DIAGNOSIS — I10 Essential (primary) hypertension: Secondary | ICD-10-CM | POA: Diagnosis not present

## 2024-04-07 DIAGNOSIS — R519 Headache, unspecified: Secondary | ICD-10-CM | POA: Diagnosis not present

## 2024-04-15 DIAGNOSIS — E041 Nontoxic single thyroid nodule: Secondary | ICD-10-CM | POA: Diagnosis not present

## 2024-04-15 DIAGNOSIS — I10 Essential (primary) hypertension: Secondary | ICD-10-CM | POA: Diagnosis not present

## 2024-04-21 DIAGNOSIS — Z0001 Encounter for general adult medical examination with abnormal findings: Secondary | ICD-10-CM | POA: Diagnosis not present

## 2024-04-21 DIAGNOSIS — E785 Hyperlipidemia, unspecified: Secondary | ICD-10-CM | POA: Diagnosis not present

## 2024-04-21 DIAGNOSIS — I10 Essential (primary) hypertension: Secondary | ICD-10-CM | POA: Diagnosis not present

## 2024-04-21 DIAGNOSIS — Z853 Personal history of malignant neoplasm of breast: Secondary | ICD-10-CM | POA: Diagnosis not present

## 2024-04-21 DIAGNOSIS — Z9481 Bone marrow transplant status: Secondary | ICD-10-CM | POA: Diagnosis not present

## 2024-04-21 DIAGNOSIS — E876 Hypokalemia: Secondary | ICD-10-CM | POA: Diagnosis not present

## 2024-04-21 DIAGNOSIS — R718 Other abnormality of red blood cells: Secondary | ICD-10-CM | POA: Diagnosis not present

## 2024-04-21 DIAGNOSIS — Z8579 Personal history of other malignant neoplasms of lymphoid, hematopoietic and related tissues: Secondary | ICD-10-CM | POA: Diagnosis not present

## 2024-04-22 ENCOUNTER — Telehealth: Payer: Self-pay

## 2024-04-22 NOTE — Telephone Encounter (Signed)
 Who is your primary care physician: Carliss Batty  Reasons for the colonoscopy: screening  Have you had a colonoscopy before?  yes  Do you have family history of colon cancer? Yes brother  Previous colonoscopy with polyps removed? Not sure  Do you have a history colorectal cancer?   no  Are you diabetic? If yes, Type 1 or Type 2?    no  Do you have a prosthetic or mechanical heart valve? no  Do you have a pacemaker/defibrillator?   no  Have you had endocarditis/atrial fibrillation? no  Have you had joint replacement within the last 12 months?  no  Do you tend to be constipated or have to use laxatives? Not answered  Do you have any history of drugs or alchohol?  no  Do you use supplemental oxygen?  no  Have you had a stroke or heart attack within the last 6 months? no  Do you take weight loss medication?  no  For female patients: have you had a hysterectomy?  Yes partial                                     are you post menopausal?       no                                            do you still have your menstrual cycle? no      Do you take any blood-thinning medications such as: (aspirin , warfarin, Plavix, Aggrenox)  yes  If yes we need the name, milligram, dosage and who is prescribing doctor ASA 81 mg Current Outpatient Medications on File Prior to Visit  Medication Sig Dispense Refill   atenolol  (TENORMIN ) 25 MG tablet Take 25 mg by mouth daily.     hydrochlorothiazide  (HYDRODIURIL ) 25 MG tablet Take 25 mg by mouth daily.     lenalidomide  (REVLIMID ) 5 MG capsule Take 1 capsule (5 mg total) by mouth daily. [employer] Auth # [id number]   Date Obtained 03/05/2024 21 capsule 0   MAGNESIUM  OXIDE PO Take 40 mg by mouth daily.     potassium chloride  20 MEQ/15ML (10%) SOLN MIX 15 ML IN 4 OUNCES OF LIQUID AND DRINK IN THE MORNING AND AT BEDTIME. 30 mL 2   No current facility-administered medications on file prior to visit.    No Known Allergies   Pharmacy:  [facility]  Primary Insurance Name: [employer]  [id number]  Best number where you can be reached: [phone]

## 2024-04-27 ENCOUNTER — Inpatient Hospital Stay: Attending: Hematology

## 2024-04-27 DIAGNOSIS — R197 Diarrhea, unspecified: Secondary | ICD-10-CM | POA: Insufficient documentation

## 2024-04-27 DIAGNOSIS — Z853 Personal history of malignant neoplasm of breast: Secondary | ICD-10-CM | POA: Insufficient documentation

## 2024-04-27 DIAGNOSIS — Z9484 Stem cells transplant status: Secondary | ICD-10-CM | POA: Insufficient documentation

## 2024-04-27 DIAGNOSIS — Z79899 Other long term (current) drug therapy: Secondary | ICD-10-CM | POA: Insufficient documentation

## 2024-04-27 DIAGNOSIS — D508 Other iron deficiency anemias: Secondary | ICD-10-CM

## 2024-04-27 DIAGNOSIS — C9001 Multiple myeloma in remission: Secondary | ICD-10-CM | POA: Diagnosis not present

## 2024-04-27 LAB — CBC WITH DIFFERENTIAL/PLATELET
Abs Immature Granulocytes: 0 K/uL (ref 0.00–0.07)
Basophils Absolute: 0 K/uL (ref 0.0–0.1)
Basophils Relative: 1 %
Eosinophils Absolute: 0 K/uL (ref 0.0–0.5)
Eosinophils Relative: 1 %
HCT: 38.2 % (ref 36.0–46.0)
Hemoglobin: 12.8 g/dL (ref 12.0–15.0)
Immature Granulocytes: 0 %
Lymphocytes Relative: 37 %
Lymphs Abs: 1.5 K/uL (ref 0.7–4.0)
MCH: 33.2 pg (ref 26.0–34.0)
MCHC: 33.5 g/dL (ref 30.0–36.0)
MCV: 99 fL (ref 80.0–100.0)
Monocytes Absolute: 0.4 K/uL (ref 0.1–1.0)
Monocytes Relative: 10 %
Neutro Abs: 2.1 K/uL (ref 1.7–7.7)
Neutrophils Relative %: 51 %
Platelets: 227 K/uL (ref 150–400)
RBC: 3.86 MIL/uL — ABNORMAL LOW (ref 3.87–5.11)
RDW: 13.4 % (ref 11.5–15.5)
WBC: 4.1 K/uL (ref 4.0–10.5)
nRBC: 0 % (ref 0.0–0.2)

## 2024-04-27 LAB — COMPREHENSIVE METABOLIC PANEL WITH GFR
ALT: 17 U/L (ref 0–44)
AST: 19 U/L (ref 15–41)
Albumin: 3.7 g/dL (ref 3.5–5.0)
Alkaline Phosphatase: 65 U/L (ref 38–126)
Anion gap: 11 (ref 5–15)
BUN: 20 mg/dL (ref 8–23)
CO2: 25 mmol/L (ref 22–32)
Calcium: 8.9 mg/dL (ref 8.9–10.3)
Chloride: 102 mmol/L (ref 98–111)
Creatinine, Ser: 0.88 mg/dL (ref 0.44–1.00)
GFR, Estimated: >60 mL/min (ref >60)
Glucose, Bld: 86 mg/dL (ref 70–99)
Potassium: 3.6 mmol/L (ref 3.5–5.1)
Sodium: 138 mmol/L (ref 135–145)
Total Bilirubin: 0.8 mg/dL (ref 0.0–1.2)
Total Protein: 7.3 g/dL (ref 6.5–8.1)

## 2024-04-27 LAB — IRON AND TIBC
Iron: 110 ug/dL (ref 28–170)
Saturation Ratios: 25 % (ref 10.4–31.8)
TIBC: 436 ug/dL (ref 250–450)
UIBC: 326 ug/dL

## 2024-04-27 LAB — FERRITIN: Ferritin: 179 ng/mL (ref 11–307)

## 2024-04-28 LAB — PROTEIN ELECTROPHORESIS, SERUM
A/G Ratio: 1 (ref 0.7–1.7)
Albumin ELP: 3.5 g/dL (ref 2.9–4.4)
Alpha-1-Globulin: 0.2 g/dL (ref 0.0–0.4)
Alpha-2-Globulin: 0.8 g/dL (ref 0.4–1.0)
Beta Globulin: 1.1 g/dL (ref 0.7–1.3)
Gamma Globulin: 1.1 g/dL (ref 0.4–1.8)
Globulin, Total: 3.4 g/dL (ref 2.2–3.9)
Total Protein ELP: 6.9 g/dL (ref 6.0–8.5)

## 2024-04-28 LAB — KAPPA/LAMBDA LIGHT CHAINS
Kappa free light chain: 23.6 mg/L — ABNORMAL HIGH (ref 3.3–19.4)
Kappa, lambda light chain ratio: 1.15 (ref 0.26–1.65)
Lambda free light chains: 20.5 mg/L (ref 5.7–26.3)

## 2024-04-30 LAB — IMMUNOFIXATION ELECTROPHORESIS
IgA: 213 mg/dL (ref 87–352)
IgG (Immunoglobin G), Serum: 1236 mg/dL (ref 586–1602)
IgM (Immunoglobulin M), Srm: 28 mg/dL (ref 26–217)
Total Protein ELP: 6.9 g/dL (ref 6.0–8.5)

## 2024-05-03 NOTE — Progress Notes (Signed)
 Patient Care Team: Hyacinth Honey, NP as PCP - General (Family Medicine) Guinevere File, MD as Referring Physician (Internal Medicine)  Clinic Day:  05/04/2024  Referring physician: Hyacinth Honey, NP   CHIEF COMPLAINT:  CC: Stage II (standard risk) IgA lambda plasma cell myeloma   Stephanie Galvan 68 y.o. female was transferred to my care after her prior physician has left.   ASSESSMENT & PLAN:   Assessment & Plan: Stephanie Galvan  is a 68 y.o. female with stage II (standard risk) IgA lambda plasma cell myeloma  Assessment & Plan Multiple myeloma in remission (HCC) Stage II standard risk IgA lambda plasma cell myeloma Induction chemotherapy with RVD into 5 cycles followed by stem cell transplant On 10 mg Revlimid  for 7 days followed by 5 mg Revlimid  for the past 1 year  - Patient reported that she recently discontinued Revlimid  in June.  We discussed risks including relapse of the disease and benefits of taking Revlimid .  Patient would like to not restart it and just do surveillance with labs - We reviewed labs today: CMP: WNL, iron  panel: WNL, SPEP: No M spike, CBC: WNL, free kappa light chain: 23.6, free lambda light chain: 20.5, ratio: 1.15.  IFE: No evidence of monoclonal protein -Overall labs look like she is in remission.  Recommended a bone marrow biopsy to confirm remission.  Patient refused bone marrow biopsy at this time.  As per patient's wishes will repeat labs in 3 months and have follow-up at the same time. Malignant neoplasm of left breast in female, estrogen receptor negative, unspecified site of breast [facility]) Patient had a history of ER PR negative, HER2 positive stage I left breast cancer with micrometastasis to the lymph node in 2006 S/p mastectomy, chemotherapy with dose dense docetaxel plus Cytoxan followed by 1 year of trastuzumab Also had radiation at that time She completed 10 years surveillance for breast cancer Compression fracture of thoracic  vertebra, unspecified thoracic vertebral level, sequela Patient had multiple compression fractures of thoracic vertebrae and was started on bone modifying agent Zometa  Initially started with 4 mg monthly and changed to every 12 weeks. Last dose 12/2022  - Continue vitamin D  and calcium supplementation    The patient understands the plans discussed today and is in agreement with them.  She knows to contact our office if she develops concerns prior to her next appointment.  90 minutes of total time was spent for this patient encounter, including preparation, face-to-face counseling with the patient and coordination of care, physical exam, and documentation of the encounter. > 50% of the time was spent on counseling as documented under my assessment and plan.    Stephanie Galvan Teague,acting as a Neurosurgeon for Stephanie Dry, MD.,have documented all relevant documentation on the behalf of Stephanie Dry, MD,as directed by  Stephanie Dry, MD while in the presence of Stephanie Dry, MD.  I, Stephanie Dry MD, have reviewed the above documentation for accuracy and completeness, and I agree with the above.     Stephanie Dry, MD  [facility] [place] - A DEPT OF [facility] [address] [place] [address] Dept: [phone] Dept Fax: [phone]   Orders Placed This Encounter  Procedures   CBC with Differential    Standing Status:   Future    Expected Date:   07/27/2024    Expiration Date:   10/25/2024   Comprehensive metabolic panel    Standing Status:   Future  Expected Date:   07/27/2024    Expiration Date:   10/25/2024   Kappa/lambda light chains    Standing Status:   Future    Expected Date:   07/27/2024    Expiration Date:   10/25/2024   Immunofixation electrophoresis    Standing Status:   Future    Expected Date:   07/27/2024    Expiration Date:   10/25/2024   Protein electrophoresis, serum    Standing Status:    Future    Expected Date:   07/27/2024    Expiration Date:   10/25/2024   Iron  and TIBC ([facility] ONLY)    Standing Status:   Future    Expected Date:   07/27/2024    Expiration Date:   10/25/2024   Ferritin    Standing Status:   Future    Expected Date:   07/27/2024    Expiration Date:   10/25/2024     ONCOLOGY HISTORY:   I have reviewed her chart and materials related to her cancer extensively and collaborated history with the patient. Summary of oncologic history is as follows:   Diagnosis: Stage II (standard risk) IgA lambda plasma cell myeloma   -04/27/2019: Initial SPEP and immunofixation: M spike: 3 abnormal protein bands: 0.7, 2.4, 0.5.  IFE: IgA lambda monoclonal protein detected. -06/02/2019: SPEP: M spike: 2.8, IFE: IgA monoclonal protein with lambda light chain specificity.  Lambda free light chain: 170, kappa free light chain: 16.1, ratio :10.55, hemoglobin: 11.4, beta-2  microglobulin: 3.8, LDH: 132, creatinine: 0.7 -06/02/2019: Bone Survey: No lytic or sclerotic bone lesions. -06/19/2019: DG L-Spine: Chronic but increased compression fractures in the lower thoracic and upper lumbar spine.  -06/19/2019: DG T-Spine: New compression fractures of T7, T8, and T10 since 2016. Chronic T12 and L1 compression fractures. -06/22/2019: Bone Marrow Biopsy.   Pathology: Hypercellular marrow involved by plasma cell neoplasm. There is a plasmacytosis (41% by manual  differential count)   MM FISH: Normal  Cytogenetics: Normal Karyotype -08/19/2019 - 11/24/2019: 5 cycles of RVD completed with Revlimid  started on 10/06/2019 -09/30/2019: Patient evaluated by Dr. 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  -08/2020:  Revlimid  10 mg daily restarted -01/04/2021: Revlimid  dose decreased to 10 mg 3 weeks on/1 week off due to worsening back pain -04/16/2023- 02/2024: Revlimid  dose decreased to 5 mg 3 weeks on/1 week off due to decreased energy levels and diarrhea. 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  Margins negative.  No vascular/lymphatic invasion.  Grade 3.pT50mic,pN1mi(sn),pMX  Left axillary sentinel lymph node biopsy: Positive for micrometastatic ductal carcinoma. -Adj dd Taxotere + cytoxan for 4 cycles and trastuzumab for 52 weeks ( per documentation) -Completed 10 years of surveillance  Current Treatment:  Surveillance  INTERVAL HISTORY:   Marta G Woodroof is here today for follow up.   Patient reported no complaints today.  She reported that she discontinued Revlimid  in June for GI side effects.  We discussed probably taking 5 mg every other day for maintenance and patient is reluctant for that.  We also discussed possible bone marrow biopsy and patient is reluctant for that as well. Reported that her diarrhea significantly improved since stopping  Revlimid .  She denies peripheral neuropathy.  Appetite is good.   I have reviewed the past medical history, past surgical history, social history and family history with the patient and they are unchanged from previous note.  ALLERGIES:  has no known allergies.  MEDICATIONS:  Current Outpatient Medications  Medication Sig Dispense Refill   atenolol  (TENORMIN ) 25 MG tablet Take 25 mg by mouth daily.     Cyanocobalamin  (VITAMIN B-12 PO) Take by mouth.     hydrochlorothiazide  (HYDRODIURIL ) 25 MG tablet Take 25 mg by mouth daily.     MAGNESIUM  OXIDE PO Take 40 mg by mouth daily.     potassium chloride  20 MEQ/15ML (10%) SOLN MIX 15 ML IN 4 OUNCES OF  LIQUID AND DRINK IN THE MORNING AND AT BEDTIME. 30 mL 2   No current facility-administered medications for this visit.    REVIEW OF SYSTEMS:   Constitutional: Denies fevers, chills or abnormal weight loss Eyes: Denies blurriness of vision Ears, nose, mouth, throat, and face: Denies mucositis or sore throat Respiratory: Denies cough, dyspnea or wheezes Cardiovascular: Denies palpitation, chest discomfort or lower extremity swelling Gastrointestinal:  Denies nausea, heartburn or change in bowel habits Skin: Denies abnormal skin rashes Lymphatics: Denies new lymphadenopathy or easy bruising Neurological:Denies numbness, tingling or new weaknesses Behavioral/Psych: Mood is stable, no new changes  All other systems were reviewed with the patient and are negative.   VITALS:  Blood pressure (!) 144/81, pulse 63, temperature 98.1 F (36.7 C), resp. rate 16, weight 165 lb 12.6 oz (75.2 kg), SpO2 100%.  Wt Readings from Last 3 Encounters:  05/04/24 165 lb 12.6 oz (75.2 kg)  01/28/24 163 lb 6.4 oz (74.1 kg)  10/29/23 164 lb 7.4 oz (74.6 kg)    Body mass index is 27.59 kg/m.  Performance status (ECOG): 0 - Asymptomatic  PHYSICAL EXAM:   GENERAL:alert, no distress and comfortable SKIN: skin color, texture, turgor are normal, no rashes or significant lesions EYES: normal, Conjunctiva are pink and non-injected, sclera clear LYMPH:  no palpable lymphadenopathy in the cervical, axillary or inguinal LUNGS: clear to auscultation and percussion with normal breathing effort HEART: regular rate & rhythm and no murmurs and no lower extremity edema ABDOMEN:abdomen soft, non-tender and normal bowel sounds Musculoskeletal:no cyanosis of digits and no clubbing  NEURO: alert & oriented x 3 with fluent speech, no focal motor/sensory deficits  LABORATORY DATA:  I have reviewed the data as listed   Lab Results  Component Value Date   WBC 4.1 04/27/2024   NEUTROABS 2.1 04/27/2024   HGB 12.8  04/27/2024   HCT 38.2 04/27/2024   MCV 99.0 04/27/2024   PLT 227 04/27/2024     Chemistry      Component Value Date/Time   NA 138 04/27/2024 1456   K 3.6 04/27/2024 1456   CL 102 04/27/2024 1456   CO2 25 04/27/2024 1456   BUN 20 04/27/2024 1456   CREATININE 0.88 04/27/2024 1456      Component Value Date/Time   CALCIUM 8.9 04/27/2024 1456   CALCIUM 9.4 12/12/2022 1355   ALKPHOS 65 04/27/2024 1456   AST 19 04/27/2024 1456   ALT 17 04/27/2024 1456   BILITOT 0.8 04/27/2024 1456      Latest Reference Range & Units 04/27/24 14:56  Iron  28 - 170 ug/dL 889  UIBC ug/dL 673  TIBC 749 - 549 ug/dL 563  Saturation Ratios 10.4 - 31.8 % 25  Ferritin 11 - 307 ng/mL 179    Latest Reference  Range & Units 04/27/24 14:56  Total Protein ELP 6.0 - 8.5 g/dL 6.0 - 8.5 g/dL 6.9 6.9  Albumin ELP 2.9 - 4.4 g/dL 3.5  Globulin, Total 2.2 - 3.9 g/dL 3.4 (C)  A/G Ratio 0.7 - 1.7  1.0 (C)  Alpha-1-Globulin 0.0 - 0.4 g/dL 0.2  Joeyj-7-Honalopw 0.4 - 1.0 g/dL 0.8  Beta Globulin 0.7 - 1.3 g/dL 1.1  Gamma Globulin 0.4 - 1.8 g/dL 1.1  M-SPIKE, % Not Observed g/dL Not Observed  SPE Interp.  Comment  Comment  Comment  IgG (Immunoglobin G), Serum 586 - 1,602 mg/dL 8,763  IgM (Immunoglobulin M), Srm 26 - 217 mg/dL 28  IgA 87 - 647 mg/dL 786  (C): Corrected   Latest Reference Range & Units 04/27/24 14:56  Kappa free light chain 3.3 - 19.4 mg/L 23.6 (H)  Lambda free light chains 5.7 - 26.3 mg/L 20.5  Kappa, lambda light chain ratio 0.26 - 1.65  1.15  Immunofixation Result, Serum  The immunofixation pattern appears unremarkable.   (H): Data is abnormally high (C): Corrected  RADIOGRAPHIC STUDIES: I have personally reviewed the radiological images as listed and agreed with the findings in the report.  None recent to review

## 2024-05-04 ENCOUNTER — Inpatient Hospital Stay: Admitting: Oncology

## 2024-05-04 VITALS — BP 144/81 | HR 63 | Temp 98.1°F | Resp 16 | Wt 165.8 lb

## 2024-05-04 DIAGNOSIS — Z9484 Stem cells transplant status: Secondary | ICD-10-CM | POA: Diagnosis not present

## 2024-05-04 DIAGNOSIS — S22000S Wedge compression fracture of unspecified thoracic vertebra, sequela: Secondary | ICD-10-CM

## 2024-05-04 DIAGNOSIS — Z853 Personal history of malignant neoplasm of breast: Secondary | ICD-10-CM | POA: Diagnosis not present

## 2024-05-04 DIAGNOSIS — Z171 Estrogen receptor negative status [ER-]: Secondary | ICD-10-CM

## 2024-05-04 DIAGNOSIS — C50912 Malignant neoplasm of unspecified site of left female breast: Secondary | ICD-10-CM | POA: Diagnosis not present

## 2024-05-04 DIAGNOSIS — M4850XA Collapsed vertebra, not elsewhere classified, site unspecified, initial encounter for fracture: Secondary | ICD-10-CM | POA: Insufficient documentation

## 2024-05-04 DIAGNOSIS — C9001 Multiple myeloma in remission: Secondary | ICD-10-CM | POA: Diagnosis not present

## 2024-05-04 DIAGNOSIS — R197 Diarrhea, unspecified: Secondary | ICD-10-CM | POA: Diagnosis not present

## 2024-05-04 DIAGNOSIS — Z79899 Other long term (current) drug therapy: Secondary | ICD-10-CM | POA: Diagnosis not present

## 2024-05-04 NOTE — Patient Instructions (Signed)

## 2024-05-04 NOTE — Assessment & Plan Note (Signed)
 Patient had multiple compression fractures of thoracic vertebrae and was started on bone modifying agent Zometa  Initially started with 4 mg monthly and changed to every 12 weeks. Last dose 12/2022  - Continue vitamin D  and calcium supplementation

## 2024-05-04 NOTE — Assessment & Plan Note (Addendum)
 Stage II standard risk IgA lambda plasma cell myeloma Induction chemotherapy with RVD into 5 cycles followed by stem cell transplant On 10 mg Revlimid  for 7 days followed by 5 mg Revlimid  for the past 1 year  - Patient reported that she recently discontinued Revlimid  in June.  We discussed risks including relapse of the disease and benefits of taking Revlimid .  Patient would like to not restart it and just do surveillance with labs - We reviewed labs today: CMP: WNL, iron  panel: WNL, SPEP: No M spike, CBC: WNL, free kappa light chain: 23.6, free lambda light chain: 20.5, ratio: 1.15.  IFE: No evidence of monoclonal protein -Overall labs look like she is in remission.  Recommended a bone marrow biopsy to confirm remission.  Patient refused bone marrow biopsy at this time.  As per patient's wishes will repeat labs in 3 months and have follow-up at the same time.

## 2024-05-04 NOTE — Assessment & Plan Note (Addendum)
 Patient had a history of ER PR negative, HER2 positive stage I left breast cancer with micrometastasis to the lymph node in 2006 S/p mastectomy, chemotherapy with dose dense docetaxel plus Cytoxan followed by 1 year of trastuzumab Also had radiation at that time She completed 10 years surveillance for breast cancer

## 2024-05-20 NOTE — Telephone Encounter (Signed)
 ASA 3 - ok room 1/2

## 2024-05-21 MED ORDER — PEG 3350-KCL-NA BICARB-NACL 420 G PO SOLR: 4000.0000 mL | Freq: Once | ORAL | 0 refills | Status: AC

## 2024-05-21 NOTE — Telephone Encounter (Signed)
 Referral was closed in March as it was over 58 months old

## 2024-05-21 NOTE — Addendum Note (Signed)
 Addended by: JEANELL GRAEME RAMAN on: 05/21/2024 11:54 AM   Modules accepted: Orders

## 2024-05-21 NOTE — Telephone Encounter (Signed)
 Spoke with pt. Scheduled for 10/21. Aware will mail instructions to her and rx for prep to be sent to pharmacy

## 2024-06-15 ENCOUNTER — Telehealth: Payer: Self-pay | Admitting: *Deleted

## 2024-06-15 ENCOUNTER — Other Ambulatory Visit (HOSPITAL_COMMUNITY): Payer: Self-pay | Admitting: Internal Medicine

## 2024-06-15 DIAGNOSIS — Z1231 Encounter for screening mammogram for malignant neoplasm of breast: Secondary | ICD-10-CM

## 2024-06-15 NOTE — Telephone Encounter (Signed)
 Pt called in and cancelled TCS for 10/21. Her referral has expired and when calls back will need new referral.

## 2024-06-30 ENCOUNTER — Encounter (HOSPITAL_COMMUNITY): Payer: Self-pay

## 2024-06-30 ENCOUNTER — Ambulatory Visit (HOSPITAL_COMMUNITY): Admit: 2024-06-30 | Admitting: Internal Medicine

## 2024-06-30 SURGERY — COLONOSCOPY
Anesthesia: Choice

## 2024-07-02 DIAGNOSIS — K5900 Constipation, unspecified: Secondary | ICD-10-CM | POA: Diagnosis not present

## 2024-07-02 DIAGNOSIS — Z800 Family history of malignant neoplasm of digestive organs: Secondary | ICD-10-CM | POA: Diagnosis not present

## 2024-07-02 DIAGNOSIS — Z1211 Encounter for screening for malignant neoplasm of colon: Secondary | ICD-10-CM | POA: Diagnosis not present

## 2024-07-17 ENCOUNTER — Ambulatory Visit (HOSPITAL_COMMUNITY)
Admission: RE | Admit: 2024-07-17 | Discharge: 2024-07-17 | Disposition: A | Discharge status: Home / Self Care | Source: Ambulatory Visit | Attending: Internal Medicine | Admitting: Internal Medicine

## 2024-07-17 ENCOUNTER — Encounter (HOSPITAL_COMMUNITY): Payer: Self-pay

## 2024-07-17 DIAGNOSIS — Z1231 Encounter for screening mammogram for malignant neoplasm of breast: Secondary | ICD-10-CM | POA: Insufficient documentation

## 2024-07-24 DIAGNOSIS — Z1211 Encounter for screening for malignant neoplasm of colon: Secondary | ICD-10-CM | POA: Diagnosis not present

## 2024-07-24 DIAGNOSIS — Z800 Family history of malignant neoplasm of digestive organs: Secondary | ICD-10-CM | POA: Diagnosis not present

## 2024-07-24 DIAGNOSIS — Z860100 Personal history of colon polyps, unspecified: Secondary | ICD-10-CM | POA: Diagnosis not present

## 2024-07-24 DIAGNOSIS — K635 Polyp of colon: Secondary | ICD-10-CM | POA: Diagnosis not present

## 2024-07-24 DIAGNOSIS — K648 Other hemorrhoids: Secondary | ICD-10-CM | POA: Diagnosis not present

## 2024-07-27 ENCOUNTER — Other Ambulatory Visit (HOSPITAL_COMMUNITY): Payer: Self-pay | Admitting: Internal Medicine

## 2024-07-27 DIAGNOSIS — R928 Other abnormal and inconclusive findings on diagnostic imaging of breast: Secondary | ICD-10-CM

## 2024-07-28 ENCOUNTER — Inpatient Hospital Stay: Attending: Hematology

## 2024-07-28 DIAGNOSIS — M4855XA Collapsed vertebra, not elsewhere classified, thoracolumbar region, initial encounter for fracture: Secondary | ICD-10-CM | POA: Insufficient documentation

## 2024-07-28 DIAGNOSIS — C9001 Multiple myeloma in remission: Secondary | ICD-10-CM | POA: Insufficient documentation

## 2024-07-28 DIAGNOSIS — Z7982 Long term (current) use of aspirin: Secondary | ICD-10-CM | POA: Insufficient documentation

## 2024-07-28 DIAGNOSIS — Z171 Estrogen receptor negative status [ER-]: Secondary | ICD-10-CM | POA: Diagnosis not present

## 2024-07-28 DIAGNOSIS — C50912 Malignant neoplasm of unspecified site of left female breast: Secondary | ICD-10-CM | POA: Diagnosis not present

## 2024-07-28 DIAGNOSIS — Z9013 Acquired absence of bilateral breasts and nipples: Secondary | ICD-10-CM | POA: Insufficient documentation

## 2024-07-28 DIAGNOSIS — Z1722 Progesterone receptor negative status: Secondary | ICD-10-CM | POA: Diagnosis not present

## 2024-07-28 DIAGNOSIS — M4854XS Collapsed vertebra, not elsewhere classified, thoracic region, sequela of fracture: Secondary | ICD-10-CM | POA: Insufficient documentation

## 2024-07-28 DIAGNOSIS — Z9484 Stem cells transplant status: Secondary | ICD-10-CM | POA: Diagnosis not present

## 2024-07-28 DIAGNOSIS — N6041 Mammary duct ectasia of right breast: Secondary | ICD-10-CM | POA: Insufficient documentation

## 2024-07-28 DIAGNOSIS — Z79899 Other long term (current) drug therapy: Secondary | ICD-10-CM | POA: Insufficient documentation

## 2024-07-28 DIAGNOSIS — N6489 Other specified disorders of breast: Secondary | ICD-10-CM | POA: Insufficient documentation

## 2024-07-28 LAB — IRON AND TIBC
Iron: 114 ug/dL (ref 28–170)
Saturation Ratios: 27 % (ref 10.4–31.8)
TIBC: 419 ug/dL (ref 250–450)
UIBC: 305 ug/dL

## 2024-07-28 LAB — CBC WITH DIFFERENTIAL/PLATELET
Abs Immature Granulocytes: 0 K/uL (ref 0.00–0.07)
Basophils Absolute: 0 K/uL (ref 0.0–0.1)
Basophils Relative: 1 %
Eosinophils Absolute: 0.1 K/uL (ref 0.0–0.5)
Eosinophils Relative: 2 %
HCT: 39.6 % (ref 36.0–46.0)
Hemoglobin: 13.1 g/dL (ref 12.0–15.0)
Immature Granulocytes: 0 %
Lymphocytes Relative: 35 %
Lymphs Abs: 1.4 K/uL (ref 0.7–4.0)
MCH: 33 pg (ref 26.0–34.0)
MCHC: 33.1 g/dL (ref 30.0–36.0)
MCV: 99.7 fL (ref 80.0–100.0)
Monocytes Absolute: 0.4 K/uL (ref 0.1–1.0)
Monocytes Relative: 10 %
Neutro Abs: 2.1 K/uL (ref 1.7–7.7)
Neutrophils Relative %: 52 %
Platelets: 228 K/uL (ref 150–400)
RBC: 3.97 MIL/uL (ref 3.87–5.11)
RDW: 11.9 % (ref 11.5–15.5)
WBC: 3.9 K/uL — ABNORMAL LOW (ref 4.0–10.5)
nRBC: 0 % (ref 0.0–0.2)

## 2024-07-28 LAB — COMPREHENSIVE METABOLIC PANEL WITH GFR
ALT: 22 U/L (ref 0–44)
AST: 25 U/L (ref 15–41)
Albumin: 4.3 g/dL (ref 3.5–5.0)
Alkaline Phosphatase: 78 U/L (ref 38–126)
Anion gap: 10 (ref 5–15)
BUN: 11 mg/dL (ref 8–23)
CO2: 30 mmol/L (ref 22–32)
Calcium: 9.6 mg/dL (ref 8.9–10.3)
Chloride: 102 mmol/L (ref 98–111)
Creatinine, Ser: 0.8 mg/dL (ref 0.44–1.00)
GFR, Estimated: >60 mL/min (ref >60)
Glucose, Bld: 76 mg/dL (ref 70–99)
Potassium: 3.6 mmol/L (ref 3.5–5.1)
Sodium: 142 mmol/L (ref 135–145)
Total Bilirubin: 0.4 mg/dL (ref 0.0–1.2)
Total Protein: 7.3 g/dL (ref 6.5–8.1)

## 2024-07-28 LAB — FERRITIN: Ferritin: 245 ng/mL (ref 11–307)

## 2024-07-29 LAB — KAPPA/LAMBDA LIGHT CHAINS
Kappa free light chain: 19.6 mg/L — ABNORMAL HIGH (ref 3.3–19.4)
Kappa, lambda light chain ratio: 1.15 (ref 0.26–1.65)
Lambda free light chains: 17.1 mg/L (ref 5.7–26.3)

## 2024-07-30 LAB — IMMUNOFIXATION ELECTROPHORESIS
IgA: 205 mg/dL (ref 87–352)
IgG (Immunoglobin G), Serum: 1252 mg/dL (ref 586–1602)
IgM (Immunoglobulin M), Srm: 26 mg/dL (ref 26–217)
Total Protein ELP: 6.8 g/dL (ref 6.0–8.5)

## 2024-07-30 LAB — PROTEIN ELECTROPHORESIS, SERUM
A/G Ratio: 1 (ref 0.7–1.7)
Albumin ELP: 3.5 g/dL (ref 2.9–4.4)
Alpha-1-Globulin: 0.2 g/dL (ref 0.0–0.4)
Alpha-2-Globulin: 0.9 g/dL (ref 0.4–1.0)
Beta Globulin: 1.1 g/dL (ref 0.7–1.3)
Gamma Globulin: 1.2 g/dL (ref 0.4–1.8)
Globulin, Total: 3.5 g/dL (ref 2.2–3.9)
Total Protein ELP: 7 g/dL (ref 6.0–8.5)

## 2024-08-04 ENCOUNTER — Ambulatory Visit (HOSPITAL_COMMUNITY)
Admission: RE | Admit: 2024-08-04 | Discharge: 2024-08-04 | Disposition: A | Discharge status: Home / Self Care | Source: Ambulatory Visit | Attending: Internal Medicine | Admitting: Internal Medicine

## 2024-08-04 ENCOUNTER — Inpatient Hospital Stay: Admitting: Oncology

## 2024-08-04 VITALS — BP 146/74 | HR 63 | Temp 97.7°F | Resp 18 | Ht 66.0 in | Wt 166.6 lb

## 2024-08-04 DIAGNOSIS — C9001 Multiple myeloma in remission: Secondary | ICD-10-CM

## 2024-08-04 DIAGNOSIS — Z171 Estrogen receptor negative status [ER-]: Secondary | ICD-10-CM

## 2024-08-04 DIAGNOSIS — R928 Other abnormal and inconclusive findings on diagnostic imaging of breast: Secondary | ICD-10-CM

## 2024-08-04 DIAGNOSIS — N6489 Other specified disorders of breast: Secondary | ICD-10-CM | POA: Diagnosis not present

## 2024-08-04 DIAGNOSIS — S22000S Wedge compression fracture of unspecified thoracic vertebra, sequela: Secondary | ICD-10-CM | POA: Diagnosis not present

## 2024-08-04 DIAGNOSIS — C50912 Malignant neoplasm of unspecified site of left female breast: Secondary | ICD-10-CM

## 2024-08-04 DIAGNOSIS — R92321 Mammographic fibroglandular density, right breast: Secondary | ICD-10-CM | POA: Diagnosis not present

## 2024-08-04 NOTE — Progress Notes (Signed)
 Patient Care Team: Hyacinth Honey, NP as PCP - General (Family Medicine) Guinevere File, MD as Referring Physician (Internal Medicine)  Clinic Day:  08/04/2024  Referring physician: Hyacinth Honey, NP   CHIEF COMPLAINT:  CC: Stage II (standard risk) IgA lambda plasma cell myeloma    ASSESSMENT & PLAN:   Assessment & Plan: Stephanie Galvan  is a 68 y.o. female with stage II (standard risk) IgA lambda plasma cell myeloma   Multiple myeloma in remission (HCC) Stage II standard risk IgA lambda plasma cell myeloma Induction chemotherapy with RVD into 5 cycles followed by stem cell transplant On 10 mg Revlimid  for 7 days followed by 5 mg Revlimid  for the past 1 year   - Patient reported that she recently discontinued Revlimid  in June.  We discussed risks including relapse of the disease and benefits of taking Revlimid .  Patient would like to not restart it and just do surveillance with labs - We reviewed labs today: CMP: WNL, iron  panel: WNL, SPEP: No M spike, CBC: WBC:3.9, Normal Hb and platelets. Free kappa light chain: 19.6, free lambda light chain: 17.1, ratio: 1.15.  IFE: unremarkable. -Overall labs look like she is in remission.  Recommended a bone marrow biopsy to confirm remission.  Patient refused bone marrow biopsy at this time.   As per patient's wishes will repeat labs in 3 months and have follow-up at the same time.   Malignant neoplasm of left breast in female, estrogen receptor negative, unspecified site of breast [facility]) Patient had a history of ER PR negative, HER2 positive stage I left breast cancer with micrometastasis to the lymph node in 2006 S/p mastectomy, chemotherapy with dose dense docetaxel plus Cytoxan followed by 1 year of trastuzumab Also had radiation at that time She completed 10 years surveillance for breast cancer Recent mammogram with no evidence of cancer.  Repeat recommended in 1 year.Due 07/2025.   Compression fracture of thoracic vertebra,  unspecified thoracic vertebral level, sequela Patient had multiple compression fractures of thoracic vertebrae and was started on bone modifying agent Zometa  Initially started with 4 mg monthly and changed to every 12 weeks. Last dose 12/2022   - Continue vitamin D  and calcium supplementation   The patient understands the plans discussed today and is in agreement with them.  She knows to contact our office if she develops concerns prior to her next appointment.  The total time spent in the appointment was 20 minutes for the encounter with patient, including review of chart and various tests results, discussions about plan of care and coordination of care plan    I,Helena R Teague,acting as a scribe for [name], MD.,have documented all relevant documentation on the behalf of [name], MD,as directed by  [name], MD while in the presence of [name], MD.  I, [name] MD, have reviewed the above documentation for accuracy and completeness, and I agree with the above.     [name], MD  [facility] - A DEPT OF [facility] [address] [place]  [place] [address] Dept: [phone] Dept Fax: [phone]   Orders Placed This Encounter  Procedures   CBC with Differential    Standing Status:   Future    Expected Date:   10/28/2024    Expiration Date:   01/26/2025   Comprehensive metabolic panel    Standing Status:   Future    Expected Date:   10/28/2024    Expiration  Date:   01/26/2025   Kappa/lambda light chains    Standing Status:   Future    Expected Date:   10/28/2024    Expiration Date:   01/26/2025   Immunofixation electrophoresis    Standing Status:   Future    Expected Date:   10/28/2024    Expiration Date:   01/26/2025   Protein electrophoresis, serum    Standing Status:   Future    Expected Date:   10/28/2024    Expiration Date:   01/26/2025     ONCOLOGY HISTORY:   I  have reviewed her chart and materials related to her cancer extensively and collaborated history with the patient. Summary of oncologic history is as follows:   Diagnosis: Stage II (standard risk) IgA lambda plasma cell myeloma   -04/27/2019: Initial SPEP and immunofixation: M spike: 3 abnormal protein bands: 0.7, 2.4, 0.5.  IFE: IgA lambda monoclonal protein detected. -06/02/2019: SPEP: M spike: 2.8, IFE: IgA monoclonal protein with lambda light chain specificity.  Lambda free light chain: 170, kappa free light chain: 16.1, ratio :10.55, hemoglobin: 11.4, beta-2  microglobulin: 3.8, LDH: 132, creatinine: 0.7 -06/02/2019: Bone Survey: No lytic or sclerotic bone lesions. -06/19/2019: DG L-Spine: Chronic but increased compression fractures in the lower thoracic and upper lumbar spine.  -06/19/2019: DG T-Spine: New compression fractures of T7, T8, and T10 since 2016. Chronic T12 and L1 compression fractures. -06/22/2019: Bone Marrow Biopsy.   Pathology: Hypercellular marrow involved by plasma cell neoplasm. There is a plasmacytosis (41% by manual  differential count)   MM FISH: Normal  Cytogenetics: Normal Karyotype -08/19/2019 - 11/24/2019: 5 cycles of RVD completed with Revlimid  started on 10/06/2019 -09/30/2019: Patient evaluated by Dr. 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  -08/2020: Revlimid  10 mg daily restarted -01/04/2021: Revlimid  dose decreased to 10 mg 3 weeks on/1 week off due to worsening back pain -04/16/2023- 02/2024: Revlimid  dose decreased to 5 mg 3 weeks on/1 week off due to decreased energy levels and diarrhea. 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  Margins negative.  No vascular/lymphatic invasion.  Grade 3.pT32mic,pN1mi(sn),pMX  Left axillary sentinel lymph node biopsy: Positive for micrometastatic ductal carcinoma. -Adj dd Taxotere + cytoxan for 4 cycles and trastuzumab for 52 weeks ( per documentation) -Completed 10 years of surveillance  Current Treatment:  Surveillance  INTERVAL HISTORY:   Discussed the use of AI scribe software for clinical note transcription with the patient, who gave verbal consent to proceed.  History of Present Illness [place] TALITA RECHT is a 68 year old female with multiple myeloma and breast cancer who presents for routine follow-up.  She has a history of multiple myeloma. She was previously on Revlimid  10 mg but discontinued it in June due to gastrointestinal side effects, including stomach upset and diarrhea. Her recent blood work shows a low white blood cell count, which has been a consistent finding in the past. Her free light chain levels have decreased from 23.6 to 19.6.  She has a history of breast cancer and recently completed surveillance.  Recent mammogram showed multiple dilated ducts of the lower inner right breast without intraluminal mass or filling defect.  Imaging  is to be repeated in one year.  She has a history of a fracture and is taking calcium and vitamin D  supplements. She temporarily stopped these medications for two weeks due to a recent colonoscopy on July 24, 2024, which was a routine screening procedure.  She also reports a history of depression, which has been improving.     I have reviewed the past medical history, past surgical history, social history and family history with the patient and they are unchanged from previous note.  ALLERGIES:  has no known allergies.  MEDICATIONS:  Current Outpatient Medications  Medication  Sig Dispense Refill   atenolol  (TENORMIN ) 25 MG tablet Take 25 mg by mouth daily.     Cyanocobalamin  (VITAMIN B-12 PO) Take by mouth.     hydrochlorothiazide  (HYDRODIURIL ) 25 MG tablet Take 25 mg by mouth daily.     MAGNESIUM  OXIDE PO Take 40 mg by mouth daily.     potassium chloride  20 MEQ/15ML (10%) SOLN MIX 15 ML IN 4 OUNCES OF LIQUID AND DRINK IN THE MORNING AND AT BEDTIME. 30 mL 2   aspirin  EC 81 MG tablet Take 81 mg by mouth daily. Swallow whole. (Patient not taking: Reported on 08/04/2024)     No current facility-administered medications for this visit.    VITALS:  Blood pressure (!) 146/74, pulse 63, temperature 97.7 F (36.5 C), temperature source Tympanic, resp. rate 18, height 5' 6 (1.676 m), weight 166 lb 9.6 oz (75.6 kg), SpO2 100%.  Wt Readings from Last 3 Encounters:  08/04/24 166 lb 9.6 oz (75.6 kg)  05/04/24 165 lb 12.6 oz (75.2 kg)  01/28/24 163 lb 6.4 oz (74.1 kg)    Body mass index is 26.89 kg/m.  Performance status (ECOG): 0 - Asymptomatic  PHYSICAL EXAM:   GENERAL:alert, no distress and comfortable SKIN: skin color, texture, turgor are normal, no rashes or significant lesions EYES: normal, Conjunctiva are pink and non-injected, sclera clear LUNGS: clear to auscultation and percussion with normal breathing effort HEART: regular rate & rhythm and no murmurs and no lower extremity edema ABDOMEN:abdomen soft, non-tender and normal bowel sounds Musculoskeletal:no cyanosis of digits and no clubbing  NEURO: alert & oriented x 3 with fluent speech   LABORATORY DATA:  I have reviewed the data as listed   Lab Results  Component Value Date   WBC 3.9 (L) 07/28/2024   NEUTROABS 2.1 07/28/2024   HGB 13.1 07/28/2024   HCT 39.6 07/28/2024   MCV 99.7 07/28/2024   PLT 228 07/28/2024     Chemistry      Component Value Date/Time   NA 142 07/28/2024 1307   K 3.6 07/28/2024 1307   CL 102 07/28/2024 1307   CO2 30 07/28/2024 1307   BUN 11 07/28/2024 1307    CREATININE 0.80 07/28/2024 1307      Component Value Date/Time   CALCIUM 9.6 07/28/2024 1307   CALCIUM 9.4 12/12/2022 1355   ALKPHOS 78 07/28/2024 1307   AST 25 07/28/2024 1307   ALT 22 07/28/2024 1307   BILITOT 0.4 07/28/2024 1307      Latest Reference Range & Units 07/28/24 13:06  Iron  28 - 170 ug/dL 885  UIBC ug/dL 694  TIBC 749 - 549 ug/dL 580  Saturation Ratios 10.4 - 31.8 % 27  Ferritin 11 - 307 ng/mL 245    Latest Reference Range & Units 07/28/24 13:07  Total Protein ELP 6.0 - 8.5 g/dL 6.0 - 8.5 g/dL 7.0 6.8  Albumin ELP 2.9 - 4.4  g/dL 3.5  Globulin, Total 2.2 - 3.9 g/dL 3.5 (C)  A/G Ratio 0.7 - 1.7  1.0 (C)  Alpha-1-Globulin 0.0 - 0.4 g/dL 0.2  Joeyj-7-Honalopw 0.4 - 1.0 g/dL 0.9  Beta Globulin 0.7 - 1.3 g/dL 1.1  Gamma Globulin 0.4 - 1.8 g/dL 1.2  M-SPIKE, % Not Observed g/dL Not Observed  SPE Interp.  Comment  Comment  Comment  IgG (Immunoglobin G), Serum 586 - 1,602 mg/dL 8,747  IgM (Immunoglobulin M), Srm 26 - 217 mg/dL 26  IgA 87 - 647 mg/dL 794  (C): Corrected   Latest Reference Range & Units 07/28/24 13:07  Kappa free light chain 3.3 - 19.4 mg/L 19.6 (H)  Lambda free light chains 5.7 - 26.3 mg/L 17.1  Kappa, lambda light chain ratio 0.26 - 1.65  1.15  Immunofixation Result, Serum  Unremarkable   (H): Data is abnormally high (C): Corrected  RADIOGRAPHIC STUDIES: I have personally reviewed the radiological images as listed and agreed with the findings in the report.  US  LIMITED ULTRASOUND INCLUDING AXILLA RIGHT BREAST CLINICAL DATA:  67 year old woman with prior history of LEFT breast malignancy status post mastectomy in 2006 and excision of RIGHT complex sclerosing lesion in 2019, presents for further evaluation of possible RIGHT breast mass.  EXAM: DIGITAL DIAGNOSTIC UNILATERAL RIGHT MAMMOGRAM WITH TOMOSYNTHESIS AND CAD; ULTRASOUND RIGHT BREAST LIMITED  TECHNIQUE: Right digital diagnostic mammography and breast tomosynthesis was performed.  The images were evaluated with computer-aided detection. ; Targeted ultrasound examination of the right breast was performed  COMPARISON:  Previous exam(s).  ACR Breast Density Category b: There are scattered areas of fibroglandular density.  FINDINGS: RIGHT:  Mammogram: Spot-compression CC and MLO views of the RIGHT breast were obtained. Elongated tubular branching asymmetry persists in the anterior depth of the lower inner RIGHT breast.  Ultrasound: Targeted sonographic evaluation of the lower inner retroareolar RIGHT breast demonstrates multiple dilated ducts without intraluminal filling debris or mass.  IMPRESSION: 1. Multiple dilated ducts of the lower inner RIGHT breast without intraluminal mass or filling defect consistent with benign etiology. 2. No evidence of RIGHT breast malignancy.  RECOMMENDATION: Screening mammogram in one year.(Code:SM-B-01Y)  I have discussed the findings and recommendations with the patient. If applicable, a reminder letter will be sent to the patient regarding the next appointment.  BI-RADS CATEGORY  2: Benign.  Electronically Signed   By: Aliene Lloyd M.D.   On: 08/04/2024 09:45 MM 3D DIAGNOSTIC MAMMOGRAM UNILATERAL RIGHT BREAST CLINICAL DATA:  68 year old woman with prior history of LEFT breast malignancy status post mastectomy in 2006 and excision of RIGHT complex sclerosing lesion in 2019, presents for further evaluation of possible RIGHT breast mass.  EXAM: DIGITAL DIAGNOSTIC UNILATERAL RIGHT MAMMOGRAM WITH TOMOSYNTHESIS AND CAD; ULTRASOUND RIGHT BREAST LIMITED  TECHNIQUE: Right digital diagnostic mammography and breast tomosynthesis was performed. The images were evaluated with computer-aided detection. ; Targeted ultrasound examination of the right breast was performed  COMPARISON:  Previous exam(s).  ACR Breast Density Category b: There are scattered areas of fibroglandular density.  FINDINGS: RIGHT:  Mammogram:  Spot-compression CC and MLO views of the RIGHT breast were obtained. Elongated tubular branching asymmetry persists in the anterior depth of the lower inner RIGHT breast.  Ultrasound: Targeted sonographic evaluation of the lower inner retroareolar RIGHT breast demonstrates multiple dilated ducts without intraluminal filling debris or mass.  IMPRESSION: 1. Multiple dilated ducts of the lower inner RIGHT breast without intraluminal mass or filling defect consistent with benign etiology. 2. No evidence of RIGHT breast malignancy.  RECOMMENDATION: Screening mammogram in one year.(Code:[id number]-B-01Y)  I have discussed the findings and recommendations with the patient. If applicable, a reminder letter will be sent to the patient regarding the next appointment.  BI-RADS CATEGORY  2: Benign.  Electronically Signed   By: Aliene Lloyd M.D.   On: 08/04/2024 09:45

## 2024-08-04 NOTE — Patient Instructions (Signed)

## 2024-08-09 ENCOUNTER — Encounter (HOSPITAL_COMMUNITY): Payer: Self-pay | Admitting: Oncology

## 2024-09-07 ENCOUNTER — Encounter: Payer: Self-pay | Admitting: *Deleted

## 2024-10-13 ENCOUNTER — Encounter: Payer: Self-pay | Admitting: *Deleted

## 2024-10-28 ENCOUNTER — Inpatient Hospital Stay: Attending: Hematology

## 2024-11-04 ENCOUNTER — Inpatient Hospital Stay: Admitting: Oncology
